# Patient Record
Sex: Female | Born: 1955 | Race: White | Hispanic: No
Health system: Midwestern US, Community
[De-identification: ages and names within clinical notes are randomized; demographics above are authoritative.]

## PROBLEM LIST (undated history)

## (undated) DIAGNOSIS — E039 Hypothyroidism, unspecified: Secondary | ICD-10-CM

## (undated) DIAGNOSIS — Z227 Latent tuberculosis: Secondary | ICD-10-CM

## (undated) DIAGNOSIS — E669 Obesity, unspecified: Secondary | ICD-10-CM

## (undated) DIAGNOSIS — M549 Dorsalgia, unspecified: Secondary | ICD-10-CM

## (undated) DIAGNOSIS — G8929 Other chronic pain: Secondary | ICD-10-CM

## (undated) DIAGNOSIS — I10 Essential (primary) hypertension: Secondary | ICD-10-CM

## (undated) DIAGNOSIS — Z8673 Personal history of transient ischemic attack (TIA), and cerebral infarction without residual deficits: Secondary | ICD-10-CM

## (undated) DIAGNOSIS — R0602 Shortness of breath: Secondary | ICD-10-CM

## (undated) DIAGNOSIS — F32A Depression, unspecified: Secondary | ICD-10-CM

## (undated) DIAGNOSIS — E782 Mixed hyperlipidemia: Principal | ICD-10-CM

## (undated) DIAGNOSIS — R059 Cough, unspecified: Secondary | ICD-10-CM

## (undated) DIAGNOSIS — M62830 Muscle spasm of back: Secondary | ICD-10-CM

## (undated) DIAGNOSIS — Z1231 Encounter for screening mammogram for malignant neoplasm of breast: Secondary | ICD-10-CM

## (undated) DIAGNOSIS — M5489 Other dorsalgia: Principal | ICD-10-CM

## (undated) DIAGNOSIS — R000 Tachycardia, unspecified: Secondary | ICD-10-CM

## (undated) DIAGNOSIS — R7309 Other abnormal glucose: Secondary | ICD-10-CM

## (undated) DIAGNOSIS — M810 Age-related osteoporosis without current pathological fracture: Secondary | ICD-10-CM

## (undated) DIAGNOSIS — M5441 Lumbago with sciatica, right side: Secondary | ICD-10-CM

## (undated) DIAGNOSIS — R748 Abnormal levels of other serum enzymes: Secondary | ICD-10-CM

## (undated) DIAGNOSIS — N2889 Other specified disorders of kidney and ureter: Secondary | ICD-10-CM

## (undated) DIAGNOSIS — E876 Hypokalemia: Secondary | ICD-10-CM

## (undated) DIAGNOSIS — R7989 Other specified abnormal findings of blood chemistry: Secondary | ICD-10-CM

## (undated) DIAGNOSIS — R053 Chronic cough: Secondary | ICD-10-CM

## (undated) DIAGNOSIS — M5451 Vertebrogenic low back pain: Secondary | ICD-10-CM

## (undated) DIAGNOSIS — D72819 Decreased white blood cell count, unspecified: Secondary | ICD-10-CM

## (undated) DIAGNOSIS — E559 Vitamin D deficiency, unspecified: Secondary | ICD-10-CM

## (undated) DIAGNOSIS — M25559 Pain in unspecified hip: Secondary | ICD-10-CM

## (undated) DIAGNOSIS — N281 Cyst of kidney, acquired: Secondary | ICD-10-CM

## (undated) DIAGNOSIS — R2681 Unsteadiness on feet: Secondary | ICD-10-CM

## (undated) DIAGNOSIS — Z20822 Contact with and (suspected) exposure to covid-19: Secondary | ICD-10-CM

## (undated) DIAGNOSIS — M62838 Other muscle spasm: Secondary | ICD-10-CM

## (undated) DIAGNOSIS — M7500 Adhesive capsulitis of unspecified shoulder: Secondary | ICD-10-CM

## (undated) DIAGNOSIS — M1990 Unspecified osteoarthritis, unspecified site: Secondary | ICD-10-CM

## (undated) DIAGNOSIS — R52 Pain, unspecified: Secondary | ICD-10-CM

## (undated) DIAGNOSIS — T84498A Other mechanical complication of other internal orthopedic devices, implants and grafts, initial encounter: Secondary | ICD-10-CM

## (undated) HISTORY — DX: Essential (primary) hypertension: I10

## (undated) HISTORY — DX: Latent tuberculosis: Z22.7

## (undated) HISTORY — DX: Dorsalgia, unspecified: M54.9

## (undated) HISTORY — DX: Hypothyroidism, unspecified: E03.9

## (undated) HISTORY — PX: SPINE SURGERY: SHX786

## (undated) HISTORY — DX: Other chronic pain: G89.29

## (undated) HISTORY — DX: Obesity, unspecified: E66.9

## (undated) HISTORY — DX: Personal history of transient ischemic attack (TIA), and cerebral infarction without residual deficits: Z86.73

## (undated) LAB — VITAMIN D 25 HYDROXY

---

## 1996-02-26 HISTORY — PX: OOPHORECTOMY: SHX86

## 1996-02-26 HISTORY — PX: ABDOMINAL HYSTERECTOMY: SHX81

## 2002-02-25 HISTORY — PX: APPENDECTOMY: SHX54

## 2003-02-26 HISTORY — PX: CHOLECYSTECTOMY: SHX55

## 2006-09-12 LAB — CBC W/O DIFF
HCT: 37.4 % (ref 35.6–45.0)
HGB: 13 g/dL (ref 11.7–15.0)
MCH: 31.3 PG (ref 26.1–32.9)
MCHC: 34.9 g/dL (ref 31.4–35.0)
MCV: 89.8 FL (ref 79.6–97.8)
MPV: 8.7 FL — ABNORMAL LOW (ref 9.3–12.9)
PLATELET: 334 10*3/uL (ref 140–440)
RBC: 4.16 M/uL (ref 3.86–5.18)
RDW: 13.1 % (ref 11.9–14.6)
WBC: 8.4 10*3/uL (ref 4.5–10.5)

## 2006-09-12 LAB — METABOLIC PANEL, BASIC
Anion gap: 10 (ref 7–16)
BUN: 11 MG/DL (ref 7–18)
CO2: 24 MMOL/L (ref 21–32)
Calcium: 9 MG/DL (ref 8.4–10.4)
Chloride: 105 MMOL/L (ref 98–107)
Creatinine: 0.7 MG/DL (ref 0.6–1.0)
GFR est AA: >60 mL/min/{1.73_m2} (ref >60)
GFR est non-AA: >60 mL/min/{1.73_m2} (ref >60)
Glucose: 80 MG/DL (ref 74–106)
Potassium: 4.2 MMOL/L (ref 3.5–5.1)
Sodium: 139 MMOL/L (ref 136–145)

## 2006-09-15 MED ORDER — FAMOTIDINE 20 MG TAB
20 mg | Freq: Once | ORAL | Status: AC
Start: 2006-09-15 — End: 2006-09-16
  Administered 2006-09-16: 10:00:00 via ORAL

## 2006-09-15 MED ORDER — METOCLOPRAMIDE 10 MG TAB
10 mg | Freq: Once | ORAL | Status: AC
Start: 2006-09-15 — End: 2006-09-16
  Administered 2006-09-16: 10:00:00 via ORAL

## 2006-09-15 MED ORDER — MIDAZOLAM 1 MG/ML IJ SOLN
1 mg/mL | Freq: Once | INTRAMUSCULAR | Status: AC
Start: 2006-09-15 — End: 2006-09-16
  Administered 2006-09-16: 11:00:00 via INTRAVENOUS

## 2006-09-15 MED ORDER — LIDOCAINE HCL 1 % (10 MG/ML) IJ SOLN
10 mg/mL (1 %) | Freq: Once | INTRAMUSCULAR | Status: DC
Start: 2006-09-15 — End: 2006-09-16

## 2006-09-15 MED ORDER — LACTATED RINGERS IV
INTRAVENOUS | Status: DC
Start: 2006-09-15 — End: 2006-09-16
  Administered 2006-09-16: 10:00:00 via INTRAVENOUS

## 2006-09-15 MED ORDER — SODIUM CHLORIDE 0.9 % IV PIGGY BACK
1000 mg | Freq: Once | INTRAVENOUS | Status: DC
Start: 2006-09-15 — End: 2006-09-15

## 2006-09-15 MED FILL — VANCOMYCIN 1,000 MG IV SOLR: 1000 mg | INTRAVENOUS | Qty: 1000

## 2006-09-16 ENCOUNTER — Inpatient Hospital Stay
Admit: 2006-09-16 | Discharge: 2006-09-17 | Disposition: A | Discharge status: Home / Self Care | Source: Home / Self Care | Attending: Orthopaedic Surgery | Admitting: Orthopaedic Surgery

## 2006-09-16 MED ORDER — LIDOCAINE (PF) 20 MG/ML (2 %) IV SYRINGE
100 mg/5 mL (2 %) | INTRAVENOUS | Status: DC | PRN
Start: 2006-09-16 — End: 2006-09-16

## 2006-09-16 MED ORDER — HYDROMORPHONE 2 MG/ML INJECTION SOLUTION
2 mg/mL | INTRAMUSCULAR | Status: DC | PRN
Start: 2006-09-16 — End: 2006-09-16
  Administered 2006-09-16 (×3): via INTRAVENOUS

## 2006-09-16 MED ORDER — MIDAZOLAM 1 MG/ML IJ SOLN
1 mg/mL | Freq: Once | INTRAMUSCULAR | Status: AC
Start: 2006-09-16 — End: 2006-09-16
  Administered 2006-09-16: 15:00:00

## 2006-09-16 MED ORDER — ATROPINE 0.4 MG/ML IJ SOLN
0.4 mg/mL | INTRAMUSCULAR | Status: DC | PRN
Start: 2006-09-16 — End: 2006-09-16

## 2006-09-16 MED ORDER — ONDANSETRON HCL 2 MG/ML IV
2 mg/mL | INTRAVENOUS | Status: DC | PRN
Start: 2006-09-16 — End: 2006-09-16

## 2006-09-16 MED ORDER — PROMETHAZINE 25 MG/ML INJECTION
25 mg/mL | INTRAMUSCULAR | Status: DC | PRN
Start: 2006-09-16 — End: 2006-09-16

## 2006-09-16 MED ORDER — LACTATED RINGERS IV
INTRAVENOUS | Status: DC
Start: 2006-09-16 — End: 2006-09-16
  Administered 2006-09-16: 14:00:00

## 2006-09-16 MED ADMIN — morphine PCA standard: @ 15:00:00 | NDC 00074405701

## 2006-09-16 MED ADMIN — vitamin E (AQUA GEMS) capsule 400 Units: @ 20:00:00 | NDC 12634034410

## 2006-09-16 MED ADMIN — phenol throat spray (CHLORASEPTIC) spray 1 Spray: ORAL | @ 18:00:00 | NDC 66440040108

## 2006-09-16 MED ADMIN — promethazine (PHENERGAN) tablet 25 mg: ORAL | @ 20:00:00 | NDC 60977000143

## 2006-09-16 MED ADMIN — dextrose 5% - 0.45% NaCl with KCl 20 mEq/L infusion: INTRAVENOUS | @ 17:00:00 | NDC 72439050041

## 2006-09-16 MED ADMIN — vancomycin (VANCOCIN) 1 g IVPB: INTRAVENOUS | @ 18:00:00 | NDC 72611076510

## 2006-09-16 MED ADMIN — vancomycin (VANCOCIN) 1 g IVPB: INTRAVENOUS | @ 10:00:00 | NDC 72611076510

## 2006-09-16 MED FILL — D5-1/2 NS & POTASSIUM CHLORIDE 20 MEQ/L IV: 20 mEq/L | INTRAVENOUS | Qty: 1000

## 2006-09-16 MED FILL — MIDAZOLAM 1 MG/ML IJ SOLN: 1 mg/mL | INTRAMUSCULAR | Qty: 2

## 2006-09-16 MED FILL — HYDROMORPHONE 2 MG/ML INJECTION SOLUTION: 2 mg/mL | INTRAMUSCULAR | Qty: 1

## 2006-09-16 MED FILL — THROMBIN-JMI 5,000 UNIT TOPICAL SOLUTION: 5000 unit | CUTANEOUS | Qty: 1

## 2006-09-16 MED FILL — PROMETHAZINE 25 MG TAB: 25 mg | ORAL | Qty: 1

## 2006-09-16 MED FILL — FAMOTIDINE 20 MG TAB: 20 mg | ORAL | Qty: 1

## 2006-09-16 MED FILL — METOCLOPRAMIDE 10 MG TAB: 10 mg | ORAL | Qty: 1

## 2006-09-16 MED FILL — SORE THROAT SPRAY: Qty: 177

## 2006-09-16 MED FILL — CYCLOBENZAPRINE 10 MG TAB: 10 mg | ORAL | Qty: 1

## 2006-09-16 MED FILL — LACTATED RINGERS IV: INTRAVENOUS | Qty: 1000

## 2006-09-16 MED FILL — VANCOMYCIN 1,000 MG IV SOLR: 1000 mg | INTRAVENOUS | Qty: 1000

## 2006-09-16 MED FILL — MORPHINE PCA STANDARD: INTRAVENOUS | Qty: 20

## 2006-09-16 MED FILL — DOCUSATE SODIUM 100 MG CAP: 100 mg | ORAL | Qty: 1

## 2006-09-16 MED FILL — ACETAMINOPHEN 325 MG TABLET: 325 mg | ORAL | Qty: 2

## 2006-09-16 NOTE — Progress Notes (Unsigned)
[facility]   [address]   [place], [place]. [address]   [phone]     OPERATIVE REPORT    NAME: Harmon, Sandra MR: [id number]  LOC: [id number] SEX: F ACCT: [id number]  DOB: February 24, 1956 AGE: 51 PT: I  ADMIT: 09/16/2006 DSCH: MSV: SUR    DATE OF PROCEDURE: 09/16/06    SURGEON: Tenny Craw, M.D.    ASSISTANT: Alma Friendly, P.A. A skilled technical assistant was deemed  necessary for this case due to the intimate proximity of the cervical  spinal cord and use of the intraoperative microscope. She was there for  the entire duration of the case.    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    PROCEDURE:  1. Anterior cervical diskectomy with decompression of the neural elements   under microscope magnification C5-C6.  2. Anterior cervical arthrodesis interbody technique C5-C6.  3. Anterior cervical instrumentation C5-C6.  4. Tricortical iliac crest bone graft harvested through a separate   incision.    ANESTHESIA: General.    ESTIMATED BLOOD LOSS: Less than 50 mL.    COMPLICATIONS: None.    POSTOPERATIVE CONDITION: Stable.    INDICATIONS FOR PROCEDURE: Sandra Harmon is a very pleasant 51 year old  lady who had an eight month history of neck pain with radiation to the  right shoulder and upper extremity that was consistent with a right C6  radiculopathy. She had weakness in the biceps and wrist extension, and  also had a diminished C6 brachioradialis reflex. She had paresthesias in  the C6 dermatome. Preoperatively, she had tried muscle relaxants, oral  steroids, and physical therapy including traction. In the outpatient  setting, the risks, benefits and potential complications of the above  listed procedure were discussed in detail and an informed consent was  obtained.     DESCRIPTION OF PROCEDURE: After adequate induction of general  anesthesia, the patient was positioned supine on the operating table. A  shoulder roll was placed. A bump was placed under the left hip. Care  was taken to pad all bony prominences. The neck and left iliac crest  were prepped and draped in the usual sterile fashion. Preoperative  antibiotics were administered. A time out was called. With confirmation  of the appropriate patient, procedure and incision site, incision was  created over the left anterior lateral aspect of the neck. A  Smith-Robinson approach was performed down to the C5-C6 disk. The disk  space was marked with a spinal needle and a cross-table cervical  fluoroscopic image was obtained to confirm appropriate spinal level.  With this confirmation, the disk space was marked with electrocautery and  the needle was removed. Caspar soft tissue retractors were replaced  beneath the longus coli and Caspar pins were placed in the C5-C6  vertebral bodies. After light distraction across the disk space, the  anulus was released with a 15 blade and excised with pituitary rongeur.  A complete diskectomy was performed using a triple zero curette and a 2  mm Kerrison. The uncovertebral joints were taken down anteriorly using a  4 mm bur. The cartilaginous endplates were also flattened and prepared  for arthrodesis using the 4 mm bur. The triple zero curette was used to  elevate the anulus fibrosis posteriorly and an interval was created  posterior to the posterior longitudinal ligament. A 2 mm  Kerrison was  used to resect the posterior osteophytes as well as the right  uncovertebral joint. A ball tipped nerve hook was then passed laterally  to confirm no further nerve root impingement of the right C6 nerve root.  With this confirmation, attention was directed towards the left iliac  crest where a separate incision was created over the iliac tubercle. The   fascial planes were reflected and a tricortical iliac crest graft was  harvested using a sagittal saw. The graft defect was packed with Gelfoam  for hemostasis. Meticulous hemostasis was obtained in the soft tissue  envelope. The fascial plane was approximated with a #1 Vicryl suture in a  locked running fashion. The skin and subcutaneous tissues were  approximated in a layered fashion. Attention was directed back towards  the neck where the tricortical iliac crest graft was shaped appropriately  into about a 7 mL tall tricortical wedge graft that was impacted into the  C5-C6 interspace for arthrodesis. At this point, the Medtronic Atlantis  Vision plating system was brought to the field and a 23 mm four-hole  plate was selected. The four peripheral screw holes were filled with  fixed angle 12 mm screws after first pre-drilling with the fixed angle  drill guide. Once the plate was secured and the locking mechanism  tightened, C-arm fluoroscopy was brought in and used to obtain tangential  anteroposterior and lateral images to confirm appropriate hardware  placement, appropriate levels, and graft placement, all of which were  felt to be satisfactory. At this point, the neck wound was liberally  irrigated. The iliac crest wound had been liberally irrigated prior to  closure as well. A Penrose drain was inserted and the neck was closed in  a layered fashion using Vicryl suture. Benzoin and Steri-Strips were  applied to both wounds. Sterile dressings were applied. The patient was  placed in a soft cervical collar. She was returned to the postanesthesia  care unit in stable condition. At the end of the case, all sponge,  needle and instrument counts were correct. Again, the intraoperative  microscope was used throughout the duration of the diskectomy and  preparation of the endplates for arthrodesis.          ____________________________________Christopher Tenna Delaine, MD A      This is an unverified document unless signed by physician.    TID: [id number] DT: 09/16/2006 10:35 A  JOB: [id number] DOC#: [id number] DD: 09/16/2006    cc: Roe Rutherford, MD

## 2006-09-17 LAB — GLUCOSE, POC: Glucose (POC): 81 mg/dL (ref 70–110)

## 2006-09-17 MED ADMIN — hydrochlorothiazide (MICROZIDE) capsule 12.5 mg: ORAL | @ 02:00:00 | NDC 71610027660

## 2006-09-17 MED ADMIN — vancomycin (VANCOCIN) 1 g IVPB: INTRAVENOUS | @ 05:00:00 | NDC 72611076510

## 2006-09-17 MED ADMIN — oxycodone (ROXICODONE) tablet 5 mg: ORAL | @ 13:00:00 | NDC 72162174902

## 2006-09-17 MED ADMIN — fenofibrate (TRICOR) tablet 145 mg: ORAL | @ 02:00:00 | NDC 82009006090

## 2006-09-17 MED ADMIN — lisinopril (PRINIVIL, ZESTRIL) tablet 20 mg: ORAL | @ 02:00:00 | NDC 82009006510

## 2006-09-17 MED ADMIN — oxycodone (ROXICODONE) tablet 5 mg: ORAL | @ 18:00:00 | NDC 72162174902

## 2006-09-17 MED ADMIN — lorazepam (ATIVAN) tablet 0.25 mg: ORAL | @ 02:00:00 | NDC 76420064790

## 2006-09-17 MED ADMIN — docusate sodium (COLACE) capsule 100 mg: ORAL | @ 13:00:00 | NDC 57664011208

## 2006-09-17 MED ADMIN — levothyroxine (SYNTHROID) tablet 150 mcg: ORAL | @ 02:00:00 | NDC 72865024490

## 2006-09-17 MED FILL — VANCOMYCIN 1,000 MG IV SOLR: 1000 mg | INTRAVENOUS | Qty: 1000

## 2006-09-17 MED FILL — DOCUSATE SODIUM 100 MG CAP: 100 mg | ORAL | Qty: 1

## 2006-09-17 MED FILL — LORAZEPAM 0.5 MG TAB: 0.5 mg | ORAL | Qty: 1

## 2006-09-17 MED FILL — OXYCODONE 5 MG TAB: 5 mg | ORAL | Qty: 1

## 2006-09-17 MED FILL — SODIUM CHLORIDE 0.9 % IV PIGGY BACK: INTRAVENOUS | Qty: 250

## 2006-09-17 MED FILL — VITAMIN E 400 UNIT CAP: 268 mg (400 unit) | ORAL | Qty: 1

## 2006-10-26 NOTE — ED Provider Notes (Signed)
HPI Comments: 51 yo white female fell over a dog yesterday injuring her right shoulder and left hip.  Shoulder Pain   The history is provided by the patient and spouse. The incident occurred 12 - 24 hours ago. The incident occurred at home. The injury mechanism was a fall. The right shoulder is affected. The pain is at a severity of 6/10. The pain has been constant since onset. The pain does not radiate. There is a history of shoulder injury. She has other injuries (Patient c/o pain in left hip also as a result of the fall.). There is no history of shoulder surgery. Pertinent negatives include no numbness, no muscle weakness and no tingling. She reports no foreign bodies present.        Review of Systems   Constitutional: Negative for fever, chills, diaphoresis and weakness.   Skin: Negative for rash and itching.   Cardiovascular: Negative for chest pain.   Gastrointestinal: Negative for nausea and vomiting.   Musculoskeletal: Positive for myalgias, joint pain and falls. Negative for neck pain and back pain.   Neurological: Negative for tingling, sensory change and focal weakness.       Physical Exam   Constitutional: She is oriented. She appears well-developed. She appears not diaphoretic. No distress.   HENT:   Head: Normocephalic and atraumatic.   Eyes: Conjunctivae are normal. Right eye exhibits no discharge. Left eye exhibits no discharge. No scleral icterus.   Cardiovascular: Normal rate.    Pulmonary/Chest: Effort normal. No respiratory distress.   Musculoskeletal: Normal range of motion. She exhibits tenderness. She exhibits no edema.        Patient has good ROM of the right shoulder, but c/o pain with movement.  She has pain to palpate the top of the shoulder, but no pain over th anterior aspect, clavicle, or deltoid.  She also has pain in the rihgt buttock, but no true hip pain per se.  No evidence of neurovascular compromise.    Neurological: She is alert and oriented. She exhibits normal muscle tone. Coordination normal.   Skin: Skin is warm and dry. No rash noted. She is not diaphoretic. No erythema. No pallor.   Psychiatric: She has a normal mood and affect.       Penicillins, Lortab and Lipitor    History   Social History   ??? Marital Status: Married     Spouse Name: N/A     Number of Children: N/A   ??? Years of Education: N/A   Occupational History   ??? Not on file.   Social History Main Topics   ??? Tobacco Use: Quit     Quit date:  10/25/1996   ??? Alcohol Use: No   ??? Drug Use: No   ??? Sexually Active:    Other Topics Concern   ??? Not on file   Social History Narrative   ??? No narrative on file

## 2006-10-26 NOTE — ED Notes (Signed)
Pt fell last pm, has pain to R shoulder, good mobility.

## 2006-10-26 NOTE — ED Notes (Signed)
Xrays of right shoulder and left hip are both neg.

## 2008-02-26 HISTORY — PX: OTHER SURGICAL HISTORY: SHX169

## 2010-04-09 MED ORDER — IPRATROPIUM-ALBUTEROL 2.5 MG-0.5 MG/3 ML NEB SOLUTION
2.5 mg-0.5 mg/3 ml | RESPIRATORY_TRACT | Status: DC
Start: 2010-04-09 — End: 2010-04-09

## 2010-04-09 MED ORDER — ONDANSETRON (PF) 4 MG/2 ML INJECTION
4 mg/2 mL | INTRAMUSCULAR | Status: AC
Start: 2010-04-09 — End: 2010-04-09
  Administered 2010-04-10: 01:00:00 via INTRAMUSCULAR

## 2010-04-09 NOTE — ED Notes (Signed)
Patient c/o pain to left flank that radiates to groin area. Patient started having pain Friday. Patient also having nausea. Patient denies urgency, odor.

## 2010-04-09 NOTE — ED Notes (Signed)
Discharge instructions and prescriptions given and explained. Instructed not to drive post pain meds. Verbalized understanding. Ambulatory out of ed with steady gait. Family at bedside for transport

## 2010-04-09 NOTE — ED Provider Notes (Signed)
HPI Comments: 81 wf complains of pain in the left back and into the groin. She has had kidney stones in the past but she also has a left ovarian cyst that has been hurting for a while. No fever or vomiting.     Patient is a 55 y.o. female presenting with flank pain. The history is provided by the patient and the spouse.   Flank Pain   This is a recurrent problem. The current episode started yesterday. The problem has not changed since onset.The problem occurs constantly. Patient reports no work related injury.The pain is associated with no known injury. The quality of the pain is described as aching. The pain radiates to the left groin. The pain is at a severity of 10/10. The pain is moderate. The pain is the same all the time. Associated symptoms include pelvic pain. Pertinent negatives include no chest pain, no fever, no abdominal pain, no abdominal swelling, no bowel incontinence, no perianal numbness and no dysuria. She has tried NSAIDs for the symptoms. The treatment provided no relief. Risk factors include history of kidney stones (history of ovarian cyst. ).        Past Medical History   Diagnosis Date   ??? Hypercholesteremia    ??? Hypothyroidism    ??? HTN    ??? Endocrine disease         Past Surgical History   Procedure Date   ??? Hx appendectomy    ??? Hx cholecystectomy    ??? Hx tah and bso    ??? Hx orthopaedic      neck repain with L hip graft         No family history on file.     History     Social History   ??? Marital Status: Married     Spouse Name: N/A     Number of Children: N/A   ??? Years of Education: N/A     Occupational History   ??? Not on file.     Social History Main Topics   ??? Smoking status: Former Smoker     Quit date: 10/25/1996   ??? Smokeless tobacco: Not on file   ??? Alcohol Use: No   ??? Drug Use: No   ??? Sexually Active:      Other Topics Concern   ??? Not on file     Social History Narrative   ??? No narrative on file                  ALLERGIES: Lipitor; Lortab; and Penicillins      Review of Systems    Constitutional: Negative for fever.   Respiratory: Negative for shortness of breath.    Cardiovascular: Negative for chest pain, palpitations and leg swelling.   Gastrointestinal: Negative for abdominal pain and bowel incontinence.   Genitourinary: Positive for flank pain and pelvic pain. Negative for dysuria, urgency, hematuria and difficulty urinating.   Musculoskeletal: Positive for back pain.       Filed Vitals:    04/09/10 1448   BP: 157/112   Pulse: 112   Temp: 97.8 ??F (36.6 ??C)   Resp: 18   Height: 5' (1.524 m)   Weight: 160 lb (72.576 kg)   SpO2: 100%            Physical Exam   [nursing notereviewed.  Constitutional: She is oriented to person, place, and time. She appears well-developed and well-nourished.   HENT:   Head: Normocephalic and atraumatic.  Right Ear: External ear normal.   Left Ear: External ear normal.   Nose: Nose normal.   Mouth/Throat: Oropharynx is clear and moist.   Eyes: Conjunctivae and EOM are normal. Pupils are equal, round, and reactive to light. Right eye exhibits no discharge. Left eye exhibits no discharge.   Neck: Normal range of motion. Neck supple. No thyromegaly present.   Cardiovascular: Normal rate, regular rhythm, normal heart sounds and intact distal pulses.  Exam reveals no gallop and no friction rub.    No murmur heard.  Pulmonary/Chest: Effort normal and breath sounds normal.   Abdominal: Soft. No tenderness. She has no rebound.        No palpable tenderness in the left flank but in the paraspinal muscles are tender. No abdominal mass or tenderness. Mild pelvic tenderness.   Musculoskeletal: Normal range of motion. She exhibits tenderness. She exhibits no edema.        Arms:  Neurological: She is alert and oriented to person, place, and time.   Skin: Skin is warm and dry.        MDM    Procedures

## 2010-04-09 NOTE — ED Notes (Signed)
Ultrasound made aware of pt completion of drinking water. Will send for pt. Pt and family informed. Verbalized understanding.

## 2010-04-09 NOTE — ED Notes (Signed)
Reports increase in left lower back pain radiating into left lower abd & into groin, worse since last pm & today, reports nausea no vomiting, reports no appetite, states she has a known ovarian cyst on her left side found 2-3 months ago but this pain is more severe

## 2010-04-09 NOTE — ED Notes (Signed)
Call received from ultrasound requesting for pt to begin drinking to fill bladder. Pt informed of plan, verbalized understanding. Water provided. Pt awaiting meds from pharmacy

## 2010-04-09 NOTE — ED Notes (Signed)
Pt c/o pain and nausea. md made aware at to bedside to explain test results.

## 2010-04-09 NOTE — ED Notes (Signed)
No ovarian cyst noted on [place].

## 2010-04-10 LAB — CBC WITH AUTOMATED DIFF
ABS. BASOPHILS: 0 10*3/uL (ref 0.0–0.2)
ABS. EOSINOPHILS: 0.1 10*3/uL (ref 0.0–0.8)
ABS. IMM. GRANS.: 0 10*3/uL (ref 0.0–0.5)
ABS. LYMPHOCYTES: 3.6 10*3/uL (ref 0.5–4.6)
ABS. MONOCYTES: 0.5 10*3/uL (ref 0.1–1.3)
ABS. NEUTROPHILS: 2.8 10*3/uL (ref 1.7–8.2)
BASOPHILS: 0 % (ref 0.0–2.0)
EOSINOPHILS: 2 % (ref 0.5–7.8)
HCT: 46.7 % — ABNORMAL HIGH (ref 35.8–46.3)
HGB: 16.1 g/dL — ABNORMAL HIGH (ref 11.7–15.4)
IMMATURE GRANULOCYTES: 0.1 % (ref 0.0–5.0)
LYMPHOCYTES: 51 % — ABNORMAL HIGH (ref 13–44)
MCH: 30.1 PG (ref 26.1–32.9)
MCHC: 34.5 g/dL (ref 31.4–35.0)
MCV: 87.3 FL (ref 79.6–97.8)
MONOCYTES: 7 % (ref 4.0–12.0)
MPV: 10.3 FL — ABNORMAL LOW (ref 10.8–14.1)
NEUTROPHILS: 40 % — ABNORMAL LOW (ref 43–78)
PLATELET: 245 10*3/uL (ref 150–450)
RBC: 5.35 M/uL — ABNORMAL HIGH (ref 4.05–5.25)
RDW: 12.9 % (ref 11.9–14.6)
WBC: 7 10*3/uL (ref 4.3–11.1)

## 2010-04-10 MED ORDER — MEPERIDINE 50 MG TAB
50 mg | ORAL_TABLET | Freq: Four times a day (QID) | ORAL | Status: DC | PRN
Start: 2010-04-10 — End: 2010-08-27

## 2010-04-10 MED ORDER — PROMETHAZINE 25 MG TAB
25 mg | ORAL_TABLET | Freq: Four times a day (QID) | ORAL | Status: DC | PRN
Start: 2010-04-10 — End: 2010-08-27

## 2010-04-10 MED ADMIN — meperidine (DEMEROL) injection 50 mg: INTRAMUSCULAR | @ 01:00:00 | NDC 00641605201

## 2010-04-10 MED FILL — DEMEROL (PF) 25 MG/ML INJECTION SYRINGE: 25 mg/mL | INTRAMUSCULAR | Qty: 2

## 2010-04-10 MED FILL — ONDANSETRON (PF) 4 MG/2 ML INJECTION: 4 mg/2 mL | INTRAMUSCULAR | Qty: 2

## 2010-08-27 LAB — METABOLIC PANEL, COMPREHENSIVE
A-G Ratio: 0.9 — ABNORMAL LOW (ref 1.2–3.5)
ALT (SGPT): 57 U/L (ref 39–65)
AST (SGOT): 21 U/L (ref 15–37)
Albumin: 3.7 g/dL (ref 3.5–5.0)
Alk. phosphatase: 93 U/L (ref 50–136)
Anion gap: 14 mmol/L (ref 7–16)
BUN: 8 MG/DL (ref 6–23)
Bilirubin, total: 0.9 MG/DL (ref 0.2–1.1)
CO2: 21 MMOL/L — ABNORMAL LOW (ref 23–32)
Calcium: 9.4 MG/DL (ref 8.3–10.4)
Chloride: 103 MMOL/L (ref 98–107)
Creatinine: 0.6 MG/DL (ref 0.6–1.5)
GFR est AA: >60 mL/min/{1.73_m2} (ref >60)
GFR est non-AA: >60 mL/min/{1.73_m2} (ref >60)
Globulin: 4.1 g/dL — ABNORMAL HIGH (ref 2.3–3.5)
Glucose: 120 MG/DL — ABNORMAL HIGH (ref 65–100)
Potassium: 3.6 MMOL/L (ref 3.5–5.1)
Protein, total: 7.8 g/dL (ref 6.3–8.2)
Sodium: 138 MMOL/L (ref 136–145)

## 2010-08-27 LAB — CBC WITH AUTOMATED DIFF
ABS. BASOPHILS: 0 10*3/uL (ref 0.0–0.2)
ABS. EOSINOPHILS: 0 10*3/uL (ref 0.0–0.8)
ABS. IMM. GRANS.: 0.1 10*3/uL (ref 0.0–0.5)
ABS. LYMPHOCYTES: 1.6 10*3/uL (ref 0.5–4.6)
ABS. MONOCYTES: 1.4 10*3/uL — ABNORMAL HIGH (ref 0.1–1.3)
ABS. NEUTROPHILS: 13 10*3/uL — ABNORMAL HIGH (ref 1.7–8.2)
BASOPHILS: 0 % (ref 0.0–2.0)
EOSINOPHILS: 0 % — ABNORMAL LOW (ref 0.5–7.8)
HCT: 42.6 % (ref 35.8–46.3)
HGB: 14.8 g/dL (ref 11.7–15.4)
IMMATURE GRANULOCYTES: 0.3 % (ref 0.0–5.0)
LYMPHOCYTES: 10 % — ABNORMAL LOW (ref 13–44)
MCH: 30.3 PG (ref 26.1–32.9)
MCHC: 34.7 g/dL (ref 31.4–35.0)
MCV: 87.3 FL (ref 79.6–97.8)
MONOCYTES: 9 % (ref 4.0–12.0)
MPV: 10.3 FL — ABNORMAL LOW (ref 10.8–14.1)
NEUTROPHILS: 81 % — ABNORMAL HIGH (ref 43–78)
PLATELET: 231 10*3/uL (ref 150–450)
RBC: 4.88 M/uL (ref 4.05–5.25)
RDW: 13 % (ref 11.9–14.6)
WBC: 16 10*3/uL — ABNORMAL HIGH (ref 4.3–11.1)

## 2010-08-27 LAB — D DIMER: D DIMER: 0.28 ug/ml(FEU) (ref <0.55)

## 2010-08-27 LAB — LACTIC ACID: Lactic acid: 1 MMOL/L (ref 0.4–2.0)

## 2010-08-27 LAB — D-DIMER, QUANTITATIVE: D-Dimer, Quant: 0.28 ug/ml(FEU) (ref <0.55)

## 2010-08-27 MED ORDER — DOXYCYCLINE HYCLATE 100 MG TAB
100 mg | ORAL_TABLET | Freq: Two times a day (BID) | ORAL | Status: AC
Start: 2010-08-27 — End: 2010-09-03

## 2010-08-27 MED ORDER — SODIUM CHLORIDE 0.9% BOLUS IV
0.9 % | Freq: Once | INTRAVENOUS | Status: AC
Start: 2010-08-27 — End: 2010-08-27
  Administered 2010-08-27: 17:00:00 via INTRAVENOUS

## 2010-08-27 MED ORDER — TRAMADOL 50 MG TAB
50 mg | ORAL_TABLET | Freq: Four times a day (QID) | ORAL | Status: DC | PRN
Start: 2010-08-27 — End: 2011-05-30

## 2010-08-27 MED ORDER — ONDANSETRON 8 MG TAB, RAPID DISSOLVE
8 mg | ORAL | Status: AC
Start: 2010-08-27 — End: 2010-08-27
  Administered 2010-08-27: 16:00:00 via ORAL

## 2010-08-27 MED ORDER — TRAMADOL 50 MG TAB
50 mg | ORAL | Status: AC
Start: 2010-08-27 — End: 2010-08-27
  Administered 2010-08-27: 19:00:00 via ORAL

## 2010-08-27 MED ORDER — ONDANSETRON 8 MG TAB, RAPID DISSOLVE
8 mg | ORAL_TABLET | Freq: Three times a day (TID) | ORAL | Status: DC | PRN
Start: 2010-08-27 — End: 2011-05-30

## 2010-08-27 MED ORDER — KETOROLAC TROMETHAMINE 30 MG/ML INJECTION
30 mg/mL (1 mL) | INTRAMUSCULAR | Status: AC
Start: 2010-08-27 — End: 2010-08-27
  Administered 2010-08-27: 16:00:00 via INTRAVENOUS

## 2010-08-27 MED ORDER — SODIUM CHLORIDE 0.9% BOLUS IV
0.9 % | Freq: Once | INTRAVENOUS | Status: AC
Start: 2010-08-27 — End: 2010-08-27
  Administered 2010-08-27: 16:00:00 via INTRAVENOUS

## 2010-08-27 NOTE — ED Notes (Signed)
Pt is feeling better but still has some headache. Will use ultram

## 2010-08-27 NOTE — ED Provider Notes (Addendum)
HPI Comments: Pt had onset of pleuritic chest pain, on post aspect of left side. No trauma. Minimal cough. Works at a day care center. Some nausea. Feeling sob, esp with any activity.     Patient is a 55 y.o. female presenting with fever. The history is provided by the patient.   Fever   This is a new problem. The current episode started 2 days ago. The problem occurs constantly. The problem has not changed since onset.Patient reports a subjective fever - was not measured.Associated symptoms include chest pain, headaches, sore throat, muscle aches and shortness of breath. Pertinent negatives include no diarrhea, no vomiting, no cough, no mental status change, no rash and no urinary symptoms.        Past Medical History   Diagnosis Date   ??? Hypercholesteremia    ??? Hypothyroidism    ??? HTN    ??? Endocrine disease         Past Surgical History   Procedure Date   ??? Hx appendectomy    ??? Hx cholecystectomy    ??? Hx tah and bso    ??? Hx orthopaedic      neck repain with L hip graft         No family history on file.     History     Social History   ??? Marital Status: Married     Spouse Name: N/A     Number of Children: N/A   ??? Years of Education: N/A     Occupational History   ??? Not on file.     Social History Main Topics   ??? Smoking status: Former Smoker     Quit date: 10/25/1996   ??? Smokeless tobacco: Not on file   ??? Alcohol Use: No   ??? Drug Use: No   ??? Sexually Active:      Other Topics Concern   ??? Not on file     Social History Narrative   ??? No narrative on file                  ALLERGIES: Lipitor; Lortab; and Penicillins      Review of Systems   Constitutional: Positive for fever, activity change and appetite change.   HENT: Positive for sore throat.    Respiratory: Positive for shortness of breath. Negative for cough.    Cardiovascular: Positive for chest pain.   Gastrointestinal: Negative for vomiting and diarrhea.   Skin: Negative for rash.   Neurological: Positive for headaches.    All other systems reviewed and are negative.        Filed Vitals:    08/27/10 1032 08/27/10 1159   BP: 133/87 129/79   Pulse: 138 134   Temp: 100 ??F (37.8 ??C)    Resp: 16 20   Height: 5' (1.524 m)    Weight: 71.668 kg (158 lb)    SpO2: 98% 94%            Physical Exam   Nursing note and vitals reviewed.  Constitutional: She is oriented to person, place, and time. She appears well-developed and well-nourished. No distress.   HENT:   Head: Normocephalic and atraumatic.   Right Ear: External ear normal.   Left Ear: External ear normal.   Mouth/Throat: Oropharynx is clear and moist.   Eyes: EOM are normal. Pupils are equal, round, and reactive to light.   Neck: Normal range of motion. Neck supple.   Cardiovascular: Regular rhythm.  tachy   Pulmonary/Chest: Effort normal and breath sounds normal. She exhibits tenderness.        Dec breath sounds   Abdominal: Soft. Bowel sounds are normal.   Musculoskeletal: Normal range of motion. She exhibits no edema and no tenderness.   Lymphadenopathy:     She has no cervical adenopathy.   Neurological: She is alert and oriented to person, place, and time.   Skin: Skin is warm and dry.   Psychiatric: She has a normal mood and affect.        MDM    Procedures

## 2010-08-27 NOTE — ED Notes (Signed)
Dr Haule at bedside.

## 2010-08-27 NOTE — ED Notes (Deleted)
Patient on bactrim and keflex.

## 2010-08-27 NOTE — ED Notes (Signed)
Feeling a little better. Repeat temp 100.6 orally

## 2010-08-27 NOTE — ED Notes (Signed)
Pt in hallbed continuing to wait for md eval at this time.

## 2010-08-27 NOTE — ED Notes (Signed)
Fluids running with no redness or swelling noted, pt to xray via transport.

## 2010-08-27 NOTE — ED Notes (Signed)
Pt states sneezing since Thursday, headache, fever and left rib pain when taking deep breaths starting yesterday morning, back pain starting last night. IV in place, blood drawn and sent to lab.

## 2010-08-27 NOTE — ED Notes (Signed)
Second liter of fluids running, no redness or swelling noted. Lights out for pt comfort, warm blanket given to family.

## 2010-08-27 NOTE — ED Notes (Signed)
PT c/o fever, headache and "left lung pain" when she breaths starting yesterday.

## 2010-08-27 NOTE — ED Notes (Signed)
Pt returned via transport.

## 2010-08-27 NOTE — ED Notes (Signed)
I have reviewed discharge instructions with the patient.  The patient verbalized understanding.

## 2010-08-27 NOTE — ED Notes (Signed)
Pt moved to room 3 placed on cycling vital signs.

## 2010-09-01 LAB — CULTURE, BLOOD: Culture result:: NO GROWTH

## 2011-05-29 NOTE — Brief Op Note (Signed)
BRIEF OPERATIVE NOTE    Date of Procedure: 05/31/2011     Preoperative Diagnosis:  ROTATOR CUFF TENDINITIS LEFT SHOULDER      AC OA LEFT SHOULDER    Postoperative Diagnosis:  ADHESIVE CAPSULITIS LEFT SHOULDER      AC OA LEFT SHOULDER    Procedure:  EXAMINATION AND MANIPULATION LEFT SHOULDER ARTHROSCOPY LEFT SHOULDER ARTHROSCOPIC SUBACROMIAL DECOMPRESSION, DISTAL CLAVICLE RESECTION, LYSIS OF ADHESIONS    Surgeon(s) and Role:     * Orvis Brill., MD - Primary    Anesthesia: General WITH INTERSCALENE BLOCK    Complications: NONE      Orvis Brill.,  MD

## 2011-05-29 NOTE — H&P (Signed)
GENERIC PRE-OP HISTORY AND PHYSICAL    Subjective:     Patient is a 56 y.o. RHD FEMALE WITH LEFT SHOULDER PAIN.  SEE OFFICE NOTE.    There are no active problems to display for this patient.    Past Medical History   Diagnosis Date   ??? Hypercholesteremia    ??? Hypothyroidism    ??? HTN    ??? Endocrine disease       Past Surgical History   Procedure Date   ??? Hx appendectomy    ??? Hx cholecystectomy    ??? Hx tah and bso    ??? Hx orthopaedic      neck repain with L hip graft      Prior to Admission medications    Medication Sig Start Date End Date Taking? Authorizing Provider   traMADol (ULTRAM) 50 mg tablet Take 1 Tab by mouth every six (6) hours as needed for Pain for 20 doses. 08/27/10   Everett Graff, MD   ondansetron (ZOFRAN ODT) 8 mg disintegrating tablet Take 1 Tab by mouth every eight (8) hours as needed for Nausea for 5 doses. 08/27/10   Everett Graff, MD   cyclobenzaprine (FLEXERIL) 10 mg tablet Take 10 mg by mouth three (3) times daily as needed.      Phys Other, MD   lisinopril (PRINIVIL, ZESTRIL) 20 mg tablet take 1 Tab by mouth daily.     Historical Provider   levothyroxine (SYNTHROID) 150 mcg tablet Take 100 mcg by mouth daily. Pm.    Historical Provider   CALCIUM 600 + D PO take 1 Tab by mouth daily.     Historical Provider   VITAMIN D PO take 1 Tab by mouth daily.     Historical Provider   vitamin E (AQUA GEMS) 400 unit capsule take 1 Cap by mouth three (3) times daily.     Historical Provider     Allergies   Allergen Reactions   ??? Lipitor (Atorvastatin) Other (comments)     Muscle pain   ??? Lortab (Hydrocodone-Acetaminophen) Itching   ??? Penicillins Hives      History   Substance Use Topics   ??? Smoking status: Former Smoker     Quit date: 10/25/1996   ??? Smokeless tobacco: Not on file   ??? Alcohol Use: No      No family history on file.   Review of Systems  A comprehensive review of systems was negative except for that written in the HPI.    Objective:     No data found.    There were no vitals taken for this  visit.  General:  Alert, cooperative, no distress, appears stated age.   Head:  Normocephalic, without obvious abnormality, atraumatic.                       Back:   Symmetric, no curvature. ROM normal. No CVA tenderness.   Lungs:   Clear to auscultation bilaterally.   Chest wall:  No tenderness or deformity.   Heart:  Regular rate and rhythm, S1, S2 normal, no murmur, click, rub or gallop.                   Extremities: Extremities normal, atraumatic, no cyanosis or edema.   Pulses: 2+ and symmetric all extremities.   Skin: Skin color, texture, turgor normal. No rashes or lesions.   Lymph nodes: Cervical, supraclavicular, and axillary nodes normal.   Neurologic: CNII-XII  intact. Normal strength, sensation and reflexes throughout.           Assessment:     ROTATOR CUFF TENDINITIS LEFT SHOULDER  AC OA LEFT SHOULDER    Plan:     The various methods of treatment have been discussed with the patient and family.   PATIENT HAS EXHAUSTED NON-OPERATIVE MODALITIES.  After consideration of risks, benefits and other options for treatment, the patient has consented to surgical intervention.    SEE OFFICE NOTE.

## 2011-05-30 LAB — HEMOGLOBIN: HGB: 13.8 g/dL (ref 11.7–15.4)

## 2011-05-30 NOTE — Other (Signed)
PAT nurse to inform patient of arrival time of 0630 for day of surgery.

## 2011-05-30 NOTE — Other (Signed)
Verified with patient: Patient's name, name of surgeon, procedure to be performed and date of procedure. All correct in system.    Pt declined offer to see anesthesia today.  Pt aware will meet MDA in preop DOS.    Labs done per anesthesia protocols / surgeon orders: HGB - WNL.    TYPE -  2    surgery.      Patient given opportunity to ask questions related to instructions given during pre- assessment.  All questions answered. Informed pt to refer to printed materials provided and to call with any questions between now and the DOS. Phone # printed on materials given to patient.

## 2011-05-31 ENCOUNTER — Inpatient Hospital Stay

## 2011-05-31 LAB — METABOLIC PANEL, BASIC
Anion gap: 11 mmol/L (ref 7–16)
BUN: 8 MG/DL (ref 6–23)
CO2: 20 MMOL/L — ABNORMAL LOW (ref 21–32)
Calcium: 7.2 MG/DL — ABNORMAL LOW (ref 8.3–10.4)
Chloride: 109 MMOL/L — ABNORMAL HIGH (ref 98–107)
Creatinine: 0.6 MG/DL (ref 0.6–1.0)
GFR est AA: >60 mL/min/{1.73_m2} (ref >60)
GFR est non-AA: >60 mL/min/{1.73_m2} (ref >60)
Glucose: 106 MG/DL — ABNORMAL HIGH (ref 65–100)
Potassium: 3.8 MMOL/L (ref 3.5–5.1)
Sodium: 140 MMOL/L (ref 136–145)

## 2011-05-31 LAB — TROPONIN I: Troponin-I, Qt.: <0.04 NG/ML (ref 0.02–0.05)

## 2011-05-31 LAB — HEMOGLOBIN A1C WITH EAG: Hemoglobin A1c: 5 % (ref 4.8–6.0)

## 2011-05-31 MED ORDER — LACTATED RINGERS IV
INTRAVENOUS | Status: DC
Start: 2011-05-31 — End: 2011-05-31
  Administered 2011-05-31: 16:00:00 via INTRAVENOUS

## 2011-05-31 MED ORDER — SODIUM CHLORIDE 0.9 % IV PIGGY BACK
1000 mg | Freq: Two times a day (BID) | INTRAVENOUS | Status: AC
Start: 2011-05-31 — End: 2011-06-01
  Administered 2011-05-31 – 2011-06-01 (×2): via INTRAVENOUS

## 2011-05-31 MED ORDER — LACTATED RINGERS IV
INTRAVENOUS | Status: DC
Start: 2011-05-31 — End: 2011-05-31
  Administered 2011-05-31: 11:00:00 via INTRAVENOUS

## 2011-05-31 MED ADMIN — acetaminophen (TYLENOL) tablet 650 mg: ORAL | @ 22:00:00 | NDC 51645070310

## 2011-05-31 MED ADMIN — vancomycin (VANCOCIN) 1,000 mg in 0.9% sodium chloride (MBP/ADV) 250 mL adv: INTRAVENOUS | @ 13:00:00 | NDC 00409653501

## 2011-05-31 MED ADMIN — lidocaine (XYLOCAINE) 10 mg/mL (1 %) injection 0.1 mL: SUBCUTANEOUS | @ 11:00:00 | NDC 00409427601

## 2011-05-31 MED ADMIN — 0.9% sodium chloride infusion: INTRAVENOUS | @ 20:00:00 | NDC 00409798309

## 2011-05-31 MED ADMIN — famotidine (PEPCID) tablet 20 mg: ORAL | @ 12:00:00 | NDC 00904555361

## 2011-05-31 MED ADMIN — ondansetron (ZOFRAN) 4 mg/2 mL injection: INTRAVENOUS | @ 17:00:00 | NDC 00781301072

## 2011-05-31 MED ADMIN — midazolam (VERSED) injection 2 mg: INTRAVENOUS | @ 13:00:00 | NDC 63323041112

## 2011-05-31 MED ADMIN — promethazine (PHENERGAN) injection 6.25 mg: INTRAVENOUS | @ 18:00:00 | NDC 00641092821

## 2011-05-31 MED ADMIN — diphenhydrAMINE (BENADRYL) injection 12.5 mg: INTRAVENOUS | NDC 63323066401

## 2011-05-31 MED ADMIN — fentaNYL citrate (PF) injection 100 mcg: INTRAVENOUS | @ 13:00:00 | NDC 00641602401

## 2011-05-31 MED ADMIN — promethazine (PHENERGAN) injection 6.25 mg: INTRAVENOUS | @ 19:00:00 | NDC 00641092821

## 2011-05-31 MED ADMIN — oxyCODONE IR (ROXICODONE) tablet 10 mg: ORAL | @ 22:00:00 | NDC 68084035411

## 2011-05-31 MED FILL — MIDAZOLAM 1 MG/ML IJ SOLN: 1 mg/mL | INTRAMUSCULAR | Qty: 2

## 2011-05-31 MED FILL — SODIUM CHLORIDE 0.9 % IV: INTRAVENOUS | Qty: 1000

## 2011-05-31 MED FILL — ONDANSETRON (PF) 4 MG/2 ML INJECTION: 4 mg/2 mL | INTRAMUSCULAR | Qty: 2

## 2011-05-31 MED FILL — VANCOMYCIN 1,000 MG IV SOLR: 1000 mg | INTRAVENOUS | Qty: 1000

## 2011-05-31 MED FILL — LIDOCAINE-EPINEPHRINE 1 %-1:100,000 IJ SOLN: 1 %-:00,000 | INTRAMUSCULAR | Qty: 20

## 2011-05-31 MED FILL — PROMETHAZINE 25 MG/ML INJECTION: 25 mg/mL | INTRAMUSCULAR | Qty: 1

## 2011-05-31 MED FILL — MAPAP (ACETAMINOPHEN) 325 MG TABLET: 325 mg | ORAL | Qty: 2

## 2011-05-31 MED FILL — FAMOTIDINE 20 MG TAB: 20 mg | ORAL | Qty: 1

## 2011-05-31 MED FILL — FENTANYL CITRATE (PF) 50 MCG/ML IJ SOLN: 50 mcg/mL | INTRAMUSCULAR | Qty: 4

## 2011-05-31 MED FILL — DIPHENHYDRAMINE HCL 50 MG/ML IJ SOLN: 50 mg/mL | INTRAMUSCULAR | Qty: 1

## 2011-05-31 MED FILL — OXYCODONE 5 MG TAB: 5 mg | ORAL | Qty: 2

## 2011-05-31 MED FILL — LIDOCAINE (PF) 20 MG/ML (2 %) IJ SOLN: 20 mg/mL (2 %) | INTRAMUSCULAR | Qty: 10

## 2011-05-31 MED FILL — SODIUM CHLORIDE 0.9 % IV PIGGY BACK: INTRAVENOUS | Qty: 250

## 2011-05-31 MED FILL — BUPIVACAINE-EPINEPHRINE (PF) 0.5 %-1:200,000 IJ SOLN: 0.5 %-1:200,000 | INTRAMUSCULAR | Qty: 30

## 2011-05-31 MED FILL — FENTANYL CITRATE (PF) 50 MCG/ML IJ SOLN: 50 mcg/mL | INTRAMUSCULAR | Qty: 2

## 2011-05-31 NOTE — Op Note (Signed)
[facility]                           [address]                            [name], [name] [address]                                [phone]                                OPERATIVE REPORT  ________________________________________________________________________  NAME:  Sandra Harmon, Sandra Harmon                        MR:  [id number]  LOC:  W3O [id number]             SEX:  F               ACCT:  [id number]  DOB:  1955/11/29            AGE:  56              PT:  T  ADMIT:  05/31/2011          DSCH:  06/01/2011     MSV:  SUR  ________________________________________________________________________      DATE OF SURGERY: 05/31/2011    PREPROCEDURE DIAGNOSES  1. Rotator cuff tendonitis.  2. Acromioclavicular joint arthritis, left shoulder.    POSTPROCEDURE DIAGNOSES  1. Adhesive capsulitis, left shoulder.  2. Acromioclavicular joint arthritis, left shoulder.    NAME OF PROCEDURE  1. Examination and manipulation of left shoulder.  2. Arthroscopy, left shoulder.  3. Arthroscopic subacromial decompression.  4. Arthroscopic distal clavicle resection.  5. Lysis of adhesions.    SURGEON: Tandy Gaw. Raynald Kemp, MD    ANESTHESIA: General, interscalene block.    CPT CODES:  [id number], [id number], [id number].  ICD-9 CODES: 726.0, 715.11.    PATHOLOGY  1. Type 2 acromion.  2. Acromioclavicular joint arthritis.  3. Capsulitis.    INDICATIONS: The patient is a 56 year old female who has developed a  sore, painful, stiff left shoulder. Preoperative physical examination and  MR demonstrated a type 2 acromion, degenerative changes of the AC joint.  No evident rotator cuff tear. The patient has exhausted nonoperative  modalities and is electively admitted for operative intervention.    PROCEDURE: Following identification, the patient was brought to the  operating suite. Following administration of general anesthesia,  interscalene block for postop pain control, 1 g of IV vancomycin as well  as measuring a  hemoglobin A1c which was normal at 5.1. The patient was  then positioned on the operating table in the supine fashion. The left  shoulder was then examined under anesthesia. The patient was noted to  have 0-160 degrees of passive forward elevation, 40 degrees external  rotation to the side, and 60 degrees external and internal rotation in  the 90 degree abducted position. At this point, a gentle manipulation of  the left shoulder was then performed. I was able to achieve 0-180 degrees  of passive rotation, 60 degrees external rotation to the side, and 90  degrees of external and internal rotation in 90 degree abducted position.  At this point, the patient was carefully positioned  in the lateral  decubitus position, right side down. Axillary roll was placed. Bean bag  was inflated. Care was taken to pad both dependent lower extremities. The  left arm was then placed in the Dyonics traction device in 15 pounds of  traction. The left shoulder was then prepped and draped in sterile  fashion. Subacromial space was then injected with 10 mL of 1% Xylocaine  with epinephrine. Scope was introduced into the shoulder. Diagnostic  arthroscopy was then commenced. Articular surfaces of the humeral and  glenoid were visualized and noted to be intact. Anterior, posterior,  superior and inferior labrum were visualized and intact. Biceps tendon  was intact. Undersurface of the rotator cuff was visualized and intact.  There was diffuse capsulitis in the glenohumeral joint. With use of 4.5  full resector Oratec wand, all adhesions were lysed. There was abundant  mobility in the axillary recess.  The scope was then flip-flopped to the posterior portal. Posterior cuff  and labrum were intact, and again there was diffuse capsulitis and this  was resected in its entirety using a 4.5 full resector. The scope was  then introduced into the subacromial space. Lateral portal was then  established. Hypertrophic hemorrhagic bursal tissue was then  resected.  Bursal side of the cuff was visualized and intact. All adhesions were  lysed on the bursal side. At this point, using Oratec wand, acromionizing  bur, an arthroscopic subacromial decompression was then performed. This  was taken down to the level of the deltoid fascia anteriorly, AC joint  posteriorly, contoured from medial to lateral. Once that was complete,  our attention was then returned to resecting the distal clavicle. Distal  10 mm and 10 mm of distal clavicle was then resected. Care was taken to  preserve the posterior superior capsule.    At this point, again, the rotator cuff was intact on the bursal side and  all adhesions were lysed. The procedure was then complete. Arthroscopic  equipment was removed from the shoulder. The portals were reapproximated  using 2-0 nylon horizontal mattress sutures. A sterile dressing was  applied, sling and swathe was applied. The patient was then expeditiously  extubated and transferred to the recovery room in stable condition.                Orvis Brill, MD                This is an unverified document unless signed by physician.    TID:  [id number]                                      DT:  05/31/2011  1:59 P  JOB:  [id number]        DOC#:  [id number]           DD:  05/31/2011    cc:   Orvis Brill, MD

## 2011-05-31 NOTE — Progress Notes (Signed)
Resting comfortably,dsng D/I.NV status WDL.Rates pain 5/10.c/o itching.Dsng dry and intact.Neurovascular status remains WDL. Cryocuff in use L arm and shoulder. Family member at bedside. Instructed to call for assist before getting out of bed. Pt verbalized understanding. Call light within reach.

## 2011-05-31 NOTE — Other (Signed)
Patient's husband updated on patient's condition and that she is still very sleepy.  Patient has taken sips of ginger ale and has used incentive spirometer with encouragement

## 2011-05-31 NOTE — Consults (Signed)
HOSPITALIST H&P/CONSULT  NAME:  Sandra Harmon   Age:  56 y.o.  DOB:   1955/12/01   MRN:   [id number]  PCP: Sol Blazing, MD  Consulting MD: Dr Lennart Pall  Treatment Team: Attending Provider: Orvis Brill., MD; Consulting Provider: Alroy Bailiff, MD  HPI:   Patient is a 56 yo female admitted by Dr Melanee Left and is s/p left shoulder arthroplasty. Patient developed post-op chest pain and the hospitalist have been consulted. Patient describes pain and dull non-radiating pain rated at 4/10. Pain is worsened by deep breathing, cough, and sternal palpation. She reports post-op nausea as well that has been relieved by phenergan. She denies SOB or diaphoresis. She denies prior cardiac history and states that she had a normal cardiac cath 5 years ago at [facility]. Denies history of diabetes.       Complete ROS done and is as stated in HPI or otherwise negative  Past Medical History   Diagnosis Date   ??? Thyroid disease      hypothyroid   ??? Arthritis       Past Surgical History   Procedure Date   ??? Hx appendectomy    ??? Hx cholecystectomy    ??? Hx cervical diskectomy 09/16/06     Anterior C5-C6 diskectomy with decompression;  anterior cervical arthrodesis interbody technique;  anterior cervical instrumentation;  tricortical iliac crest bone graft harvested through a separate incision.    ??? Hx tah and bso       Prior to Admission Medications   Medication Last Dose Informant Patient Reported? Taking?   levothyroxine (SYNTHROID) 75 mcg tablet 05/31/2011 at Unknown  Yes Yes   Take 75 mcg by mouth Daily (before breakfast). Take / use AM day of surgery with a sip of water per anesthesia protocols.   traMADol (ULTRAM) 50 mg tablet 05/31/2011 at Unknown  Yes Yes   Take 50 mg by mouth every eight (8) hours as needed. Take / use AM day of surgery with a sip of water per anesthesia protocols.   sulfacetamide (BLEPH-10) 10 % ophthalmic solution 05/30/2011 at Unknown  Yes Yes   Administer 2 Drops to both eyes four (4) times daily.   diclofenac EC  (VOLTAREN) 75 mg EC tablet 05/30/2011 at Unknown  Yes Yes   Take 75 mg by mouth two (2) times a day.   diclofenac (VOLTAREN) 1 % topical gel 05/30/2011 at Unknown  Yes Yes   Apply 2 g to affected area four (4) times daily.   cyclobenzaprine (FLEXERIL) 10 mg tablet 05/24/2011 at Unknown  Yes Yes   Take 10 mg by mouth three (3) times daily as needed.          Allergies   Allergen Reactions   ??? Lipitor (Atorvastatin) Other (comments)     Muscle pain   ??? Lortab (Hydrocodone-Acetaminophen) Itching   ??? Penicillins Hives      History   Substance Use Topics   ??? Smoking status: Former Smoker -- 1.0 packs/day for 15 years     Quit date: 10/25/1996   ??? Smokeless tobacco: Not on file   ??? Alcohol Use: No      Family History   Problem Relation Age of Onset   ??? Lung Disease Mother    ??? Asthma Mother    ??? Liver Disease Father    ??? Stroke Brother       Objective:   BP 133/92   Pulse 96   Temp 95.3 ??F (  35.2 ??C)   Resp 18   Ht 5' (1.524 m)   Wt 74.844 kg (165 lb)   BMI 32.22 kg/m2   SpO2 100%   Temp (24hrs), Avg:96.7 ??F (35.9 ??C), Min:95.3 ??F (35.2 ??C), Max:98 ??F (36.7 ??C)    Oxygen Therapy  O2 Sat (%): 100 % (05/31/11 1626)  O2 Device: Nasal cannula (05/31/11 1600)  O2 Flow Rate (L/min): 2 l/min (05/31/11 1600)  Physical Exam:  General:    Alert, cooperative, no distress, appears stated age.     Head:   Normocephalic, without obvious abnormality, atraumatic.  Nose:  Nares normal. No drainage or sinus tenderness.  Lungs:   Clear to auscultation bilaterally.  No Wheezing or Rhonchi. No rales.  Heart:   Regular rate and rhythm,  no murmur, rub or gallop. Sternal tenderness to palpation   Abdomen:   Soft, non-tender. Not distended.  Bowel sounds normal. No masses  Extremities: No cyanosis.  No edema. No clubbing  Skin:     Texture, turgor normal. No rashes or lesions.  Not Jaundiced  Neurologic: Alert and oriented    Data Review:   Recent Results (from the past 24 hour(s))   HEMOGLOBIN A1C    Collection Time    05/31/11  7:25 AM       Component  Value Range    Hemoglobin A1c 5.0  4.8 - 6.0 %   METABOLIC PANEL, BASIC    Collection Time    05/31/11 11:21 AM       Component Value Range    Sodium 140  136 - 145 MMOL/L    Potassium 3.8  3.5 - 5.1 MMOL/L    Chloride 109 (*) 98 - 107 MMOL/L    CO2 20 (*) 21 - 32 MMOL/L    Anion gap 11  7 - 16 mmol/L    Glucose 106 (*) 65 - 100 MG/DL    BUN 8  6 - 23 MG/DL    Creatinine 2.95  0.6 - 1.0 MG/DL    GFR est AA >62  >13 ml/min/1.6m2    GFR est non-AA >60  >60 ml/min/1.68m2    Calcium 7.2 (*) 8.3 - 10.4 MG/DL   TROPONIN I    Collection Time    05/31/11  3:20 PM       Component Value Range    Troponin-I, Qt. <0.04  0.02 - 0.05 NG/ML         Assessment and Plan:     Active Hospital Problems   Diagnoses Date Noted   ??? Adhesive capsulitis of shoulder 05/31/2011   ??? Primary localized osteoarthrosis, shoulder region 05/31/2011   ??? Chest pain 05/31/2011     ??  follow up serial troponins. Suspect pain is musculoskeletal       Supervising Physician: Dr Berneta Levins, PA

## 2011-05-31 NOTE — H&P (Signed)
Date of Surgery Update:  Sandra Harmon was seen and examined.  History and physical has been reviewed. There have been no significant clinical changes since the completion of the originally dated History and Physical.    Signed By: Orvis Brill, MD     May 31, 2011 6:36 AM

## 2011-05-31 NOTE — Progress Notes (Signed)
Patient complains of post op chest pain  Post op x ray no pneumo  Will check labs  hospitalist consult  Will discharge home in am if stable

## 2011-05-31 NOTE — Other (Signed)
Left shoulder xray completed at 1112 and blood drawn at 1121 and sent to lab

## 2011-05-31 NOTE — Op Note (Signed)
[facility]                           [address]                            , [place] [address]                                [phone]                                OPERATIVE REPORT  ________________________________________________________________________  NAME:  Sandra Harmon, Sandra Harmon                        MR:  [id number]  LOC:  W3O [id number]             SEX:  F               ACCT:  [id number]  DOB:  03/15/1955            AGE:  56              PT:  T  ADMIT:  05/31/2011          DSCH:  06/01/2011     MSV:  SUR  ________________________________________________________________________      DATE OF SURGERY: 05/31/2011    PREPROCEDURE DIAGNOSES  1. Rotator cuff tendonitis.  2. Acromioclavicular joint arthritis, left shoulder.    POSTPROCEDURE DIAGNOSES  1. Adhesive capsulitis, left shoulder.  2. Acromioclavicular joint arthritis, left shoulder.    NAME OF PROCEDURE  1. Examination and manipulation of left shoulder.  2. Arthroscopy, left shoulder.  3. Arthroscopic subacromial decompression.  4. Arthroscopic distal clavicle resection.  5. Lysis of adhesions.    SURGEON: Tomeika Weinmann G. Posta Jr, MD    ANESTHESIA: General, interscalene block.    CPT CODES:  29826, 29825, 29824.  ICD-9 CODES: 726.0, 715.11.    PATHOLOGY  1. Type 2 acromion.  2. Acromioclavicular joint arthritis.  3. Capsulitis.    INDICATIONS: The patient is a 56-year-old female who has developed a  sore, painful, stiff left shoulder. Preoperative physical examination and  MR demonstrated a type 2 acromion, degenerative changes of the AC joint.  No evident rotator cuff tear. The patient has exhausted nonoperative  modalities and is electively admitted for operative intervention.    PROCEDURE: Following identification, the patient was brought to the  operating suite. Following administration of general anesthesia,  interscalene block for postop pain control, 1 g of IV vancomycin as well  as measuring a  hemoglobin A1c which was normal at 5.1. The patient was  then positioned on the operating table in the supine fashion. The left  shoulder was then examined under anesthesia. The patient was noted to  have 0-160 degrees of passive forward elevation, 40 degrees external  rotation to the side, and 60 degrees external and internal rotation in  the 90 degree abducted position. At this point, a gentle manipulation of  the left shoulder was then performed. I was able to achieve 0-180 degrees  of passive rotation, 60 degrees external rotation to the side, and 90  degrees of external and internal rotation in 90 degree abducted position.  At this point, the patient was carefully positioned   in the lateral  decubitus position, right side down. Axillary roll was placed. Bean bag  was inflated. Care was taken to pad both dependent lower extremities. The  left arm was then placed in the Dyonics traction device in 15 pounds of  traction. The left shoulder was then prepped and draped in sterile  fashion. Subacromial space was then injected with 10 mL of 1% Xylocaine  with epinephrine. Scope was introduced into the shoulder. Diagnostic  arthroscopy was then commenced. Articular surfaces of the humeral and  glenoid were visualized and noted to be intact. Anterior, posterior,  superior and inferior labrum were visualized and intact. Biceps tendon  was intact. Undersurface of the rotator cuff was visualized and intact.  There was diffuse capsulitis in the glenohumeral joint. With use of 4.5  full resector Oratec wand, all adhesions were lysed. There was abundant  mobility in the axillary recess.  The scope was then flip-flopped to the posterior portal. Posterior cuff  and labrum were intact, and again there was diffuse capsulitis and this  was resected in its entirety using a 4.5 full resector. The scope was  then introduced into the subacromial space. Lateral portal was then  established. Hypertrophic hemorrhagic bursal tissue was then  resected.  Bursal side of the cuff was visualized and intact. All adhesions were  lysed on the bursal side. At this point, using Oratec wand, acromionizing  bur, an arthroscopic subacromial decompression was then performed. This  was taken down to the level of the deltoid fascia anteriorly, AC joint  posteriorly, contoured from medial to lateral. Once that was complete,  our attention was then returned to resecting the distal clavicle. Distal  10 mm and 10 mm of distal clavicle was then resected. Care was taken to  preserve the posterior superior capsule.    At this point, again, the rotator cuff was intact on the bursal side and  all adhesions were lysed. The procedure was then complete. Arthroscopic  equipment was removed from the shoulder. The portals were reapproximated  using 2-0 nylon horizontal mattress sutures. A sterile dressing was  applied, sling and swathe was applied. The patient was then expeditiously  extubated and transferred to the recovery room in stable condition.                Kylieann Eagles G Posta, Jr, MD                This is an unverified document unless signed by physician.    TID:  [id number]                                      DT:  05/31/2011  1:59 P  JOB:  [id number]        DOC#:  [id number]           DD:  05/31/2011    cc:   Dasie Chancellor G Posta, Jr, MD

## 2011-05-31 NOTE — Progress Notes (Signed)
TRANSFER - IN REPORT:    Verbal report received from [facility] RN @ [phone] (name) on Taronda Comacho  being received from PACU (unit) for routine post - op      Report consisted of patient???s Situation, Background, Assessment and   Recommendations(SBAR).     Information from the following report(s) SBAR, Kardex, [facility] and Recent Results was reviewed with the receiving nurse.    Opportunity for questions and clarification was provided.      Assessment completed upon patient???s arrival to unit room 320 and care assumed.

## 2011-05-31 NOTE — Op Note (Deleted)
[facility]                           [address]                            [place], [place] [address]                                [phone]                                OPERATIVE REPORT  ________________________________________________________________________  NAME:  Sandra Harmon, Sandra Harmon                        MR:  [id number]  LOC:  SFOP                  SEX:  F               ACCT:  [id number]  DOB:  1955-06-21            AGE:  56              PT:  B  ADMIT:  06/03/2011          DSCH:                 MSV:  MED  ________________________________________________________________________      DATE OF SURGERY: 05/31/2011    PREPROCEDURE DIAGNOSES  1. Rotator cuff tendonitis.  2. Acromioclavicular joint arthritis, left shoulder.    POSTPROCEDURE DIAGNOSES  1. Adhesive capsulitis, left shoulder.  2. Acromioclavicular joint arthritis, left shoulder.    NAME OF PROCEDURE  1. Examination and manipulation of left shoulder.  2. Arthroscopy, left shoulder.  3. Arthroscopic subacromial decompression.  4. Arthroscopic distal clavicle resection.  5. Lysis of adhesions.    SURGEON: Tandy Gaw. Raynald Kemp, MD    ANESTHESIA: General, interscalene block.    CPT CODES:  [id number], [id number], [id number].  ICD-9 CODES: 726.0, 715.11.    PATHOLOGY  1. Type 2 acromion.  2. Acromioclavicular joint arthritis.  3. Capsulitis.    INDICATIONS: The patient is a 56 year old female who has developed a  sore, painful, stiff left shoulder. Preoperative physical examination and  MR demonstrated a type 2 acromion, degenerative changes of the AC joint.  No evident rotator cuff tear. The patient has exhausted nonoperative  modalities and is electively admitted for operative intervention.    PROCEDURE: Following identification, the patient was brought to the  operating suite. Following administration of general anesthesia,  interscalene block for postop pain control, 1 g of IV vancomycin as well  as measuring a hemoglobin A1c  which was normal at 5.1. The patient was  then positioned on the operating table in the supine fashion. The left  shoulder was then examined under anesthesia. The patient was noted to  have 0-160 degrees of passive forward elevation, 40 degrees external  rotation to the side, and 60 degrees external and internal rotation in  the 90 degree abducted position. At this point, a gentle manipulation of  the left shoulder was then performed. I was able to achieve 0-180 degrees  of passive rotation, 60 degrees external rotation to the side, and 90  degrees of external and internal rotation  in 90 degree abducted position.  At this point, the patient was carefully positioned in the lateral  decubitus position, right side down. Axillary roll was placed. Bean bag  was inflated. Care was taken to pad both dependent lower extremities. The  left arm was then placed in the Dyonics traction device in 15 pounds of  traction. The left shoulder was then prepped and draped in sterile  fashion. Subacromial space was then injected with 10 mL of 1% Xylocaine  with epinephrine. Scope was introduced into the shoulder. Diagnostic  arthroscopy was then commenced. Articular surfaces of the humeral and  glenoid were visualized and noted to be intact. Anterior, posterior,  superior and inferior labrum were visualized and intact. Biceps tendon  was intact. Undersurface of the rotator cuff was visualized and intact.  There was diffuse capsulitis in the glenohumeral joint. With use of 4.5  full resector Oratec wand, all adhesions were lysed. There was abundant  mobility in the axillary recess.  The scope was then flip-flopped to the posterior portal. Posterior cuff  and labrum were intact, and again there was diffuse capsulitis and this  was resected in its entirety using a 4.5 full resector. The scope was  then introduced into the subacromial space. Lateral portal was then  established. Hypertrophic hemorrhagic bursal tissue was then resected.   Bursal side of the cuff was visualized and intact. All adhesions were  lysed on the bursal side. At this point, using Oratec wand, acromionizing  bur, an arthroscopic subacromial decompression was then performed. This  was taken down to the level of the deltoid fascia anteriorly, AC joint  posteriorly, contoured from medial to lateral. Once that was complete,  our attention was then returned to resecting the distal clavicle. Distal  10 mm and 10 mm of distal clavicle was then resected. Care was taken to  preserve the posterior superior capsule.    At this point, again, the rotator cuff was intact on the bursal side and  all adhesions were lysed. The procedure was then complete. Arthroscopic  equipment was removed from the shoulder. The portals were reapproximated  using 2-0 nylon horizontal mattress sutures. A sterile dressing was  applied, sling and swathe was applied. The patient was then expeditiously  extubated and transferred to the recovery room in stable condition.                Orvis Brill, MD                This is an unverified document unless signed by physician.    TID:  [id number]                                      DT:  05/31/2011  1:59 P  JOB:  [id number]        DOC#:  [id number]           DD:  05/31/2011    cc:   Orvis Brill, MD

## 2011-05-31 NOTE — Progress Notes (Signed)
Blood pressure 132/83, pulse 60, temperature 98 ??F (36.7 ??C), resp. rate 16, height 5' (1.524 m), weight 74.844 kg (165 lb), SpO2 91.00%. Vital signs and neurological status are back to preoperative baseline and stable. The patient is alert and oriented. Pain control is satisfactory. The patient appears adequately hydrated. No anesthetic complications are noted. Post-operative follow up to be provided by Surgeon. DC if discharge criteria met.

## 2011-05-31 NOTE — Other (Signed)
TRANSFER - OUT REPORT:    Verbal report given to Bing Quarry, RN on Sandra Harmon  being transferred to 320 for routine post - op       Report consisted of patient???s Situation, Background, Assessment and   Recommendations(SBAR).     Information from the following report(s) OR Summary, Procedure Summary, Intake/Output and MAR was reviewed with the receiving nurse.    Opportunity for questions and clarification was provided.

## 2011-05-31 NOTE — Consults (Addendum)
Pt seen and examined.  Agree w plan of care.  Pain is reproducible, likely related to extravasion of fluid into subcutaneous chest.  Doubt acs, but check serial troponin, and ekg in 8 hours.  No anticouagulation at this point.  Limit qt prolongating meds, including phenergan.  Repeat ekg in am.

## 2011-05-31 NOTE — Progress Notes (Signed)
Anesthesiology ISB Procedure Note    Risks, benefits, and alternatives of interscalene nerve block discussed with patient and family.  Patient elects to proceed.  Routine monitors, oxygen cannula, and appropriate sedation used. Patient sedated but responsive to questions during block. The left neck was prepped with chlorhexidine.  1% lidocaine used as local over interscalene groove. Stimuplex needle passed and biceps response illicited at 0.4 ma. 30 cc of 0.50% Bupivacaine  with 1:200k epinephrine plus 5 cc of 2% Lidocaine without epinephrine injected. Patient tolerated the procedure well. Ultrasound guided with image for post op pain assisted by Dr. Margarette Asal.

## 2011-06-01 LAB — METABOLIC PANEL, BASIC
Anion gap: 10 mmol/L (ref 7–16)
BUN: 7 MG/DL (ref 6–23)
CO2: 23 MMOL/L (ref 21–32)
Calcium: 8.5 MG/DL (ref 8.3–10.4)
Chloride: 112 MMOL/L — ABNORMAL HIGH (ref 98–107)
Creatinine: 0.8 MG/DL (ref 0.6–1.0)
GFR est AA: >60 mL/min/{1.73_m2} (ref >60)
GFR est non-AA: >60 mL/min/{1.73_m2} (ref >60)
Glucose: 80 MG/DL (ref 65–100)
Potassium: 4 MMOL/L (ref 3.5–5.1)
Sodium: 145 MMOL/L (ref 136–145)

## 2011-06-01 LAB — CBC W/O DIFF
HCT: 35.3 % — ABNORMAL LOW (ref 35.8–46.3)
HGB: 11.8 g/dL (ref 11.7–15.4)
MCH: 30 PG (ref 26.1–32.9)
MCHC: 33.4 g/dL (ref 31.4–35.0)
MCV: 89.8 FL (ref 79.6–97.8)
MPV: 10.1 FL — ABNORMAL LOW (ref 10.8–14.1)
PLATELET: 200 10*3/uL (ref 150–450)
RBC: 3.93 M/uL — ABNORMAL LOW (ref 4.05–5.25)
RDW: 13.3 % (ref 11.9–14.6)
WBC: 7.7 10*3/uL (ref 4.3–11.1)

## 2011-06-01 LAB — MAGNESIUM: Magnesium: 1.6 MG/DL — ABNORMAL LOW (ref 1.8–2.4)

## 2011-06-01 LAB — TROPONIN I
Troponin-I, Qt.: <0.04 NG/ML (ref 0.02–0.05)
Troponin-I, Qt.: <0.04 NG/ML (ref 0.02–0.05)

## 2011-06-01 MED ADMIN — oxyCODONE IR (ROXICODONE) tablet 15 mg: ORAL | @ 08:00:00 | NDC 68084035411

## 2011-06-01 MED ADMIN — oxyCODONE IR (ROXICODONE) tablet 15 mg: ORAL | @ 12:00:00 | NDC 68084035411

## 2011-06-01 MED ADMIN — oxyCODONE IR (ROXICODONE) tablet 15 mg: ORAL | @ 05:00:00 | NDC 68084035411

## 2011-06-01 MED ADMIN — enoxaparin (LOVENOX) injection 30 mg: SUBCUTANEOUS | @ 12:00:00 | NDC 00075062430

## 2011-06-01 MED ADMIN — diphenhydrAMINE (BENADRYL) injection 12.5 mg: INTRAVENOUS | @ 04:00:00 | NDC 63323066401

## 2011-06-01 MED ADMIN — diphenhydrAMINE (BENADRYL) injection 12.5 mg: INTRAVENOUS | @ 10:00:00 | NDC 63323066401

## 2011-06-01 MED ADMIN — docusate sodium (COLACE) capsule 100 mg: ORAL | @ 12:00:00 | NDC 62584068311

## 2011-06-01 MED ADMIN — diphenhydrAMINE (BENADRYL) capsule 25 mg: ORAL | @ 14:00:00 | NDC 00904530661

## 2011-06-01 MED ADMIN — oxyCODONE IR (ROXICODONE) tablet 15 mg: ORAL | @ 16:00:00 | NDC 68084035411

## 2011-06-01 MED ADMIN — magnesium oxide (MAG-OX) tablet 400 mg: ORAL | @ 14:00:00 | NDC 63739035410

## 2011-06-01 MED ADMIN — oxyCODONE IR (ROXICODONE) tablet 15 mg: ORAL | @ 02:00:00 | NDC 68084035411

## 2011-06-01 MED FILL — MAGOX 400 MG (241.3 MG MAGNESIUM) TABLET: 400 mg (241.3 mg magnesium) | ORAL | Qty: 1

## 2011-06-01 MED FILL — OXYCODONE 5 MG TAB: 5 mg | ORAL | Qty: 3

## 2011-06-01 MED FILL — LOVENOX 30 MG/0.3 ML SUB-Q SYRINGE: 30 mg/0.3 mL | SUBCUTANEOUS | Qty: 0.3

## 2011-06-01 MED FILL — DIPHENHYDRAMINE HCL 50 MG/ML IJ SOLN: 50 mg/mL | INTRAMUSCULAR | Qty: 1

## 2011-06-01 MED FILL — DIPHENHYDRAMINE 25 MG CAP: 25 mg | ORAL | Qty: 1

## 2011-06-01 MED FILL — VANCOMYCIN 1,000 MG IV SOLR: 1000 mg | INTRAVENOUS | Qty: 1000

## 2011-06-01 MED FILL — DOCUSATE SODIUM 100 MG CAP: 100 mg | ORAL | Qty: 1

## 2011-06-01 MED FILL — HYDROMORPHONE (PF) 1 MG/ML IJ SOLN: 1 mg/mL | INTRAMUSCULAR | Qty: 1

## 2011-06-01 MED FILL — ZOLPIDEM 10 MG TAB: 10 mg | ORAL | Qty: 1

## 2011-06-01 NOTE — Progress Notes (Signed)
In bed,awake,alert,pain level=9/10 as stated,,IV access intact and patent on right hand with fluids infusing,,right shoulder  dressing intact,to be changed after therapy,,arm sling on,strong radial pulse,wiggles fingers without difficulty,given all p.o.medications plus three tablets Oxycodone (5mg s.each),prn for pain,,immediate needs attended to by her husband.Marland KitchenMarland Kitchen

## 2011-06-01 NOTE — Progress Notes (Signed)
Discharge instructions given to patient and her husband,both given opportunity for questions and for clarifications,,also patient has four prescriptions that Dr.Posta has given yesterday (prescriptions for Oxycodone,Phenergan,Toradol and- Ambien),,patient wearing arm sling,,discharge patient per wheelchair,stable condition,accompanied by her husband...Marland KitchenMarland Kitchen

## 2011-06-01 NOTE — Discharge Summary (Signed)
Total Joint Discharge Summary      Patient ID:  Sandra Harmon  [id number]  55 y.o.  Jan 09, 1956    Admit date: 05/31/2011    Discharge date and time: 06/01/11     Admitting Physician: Orvis Brill., MD     Discharge Physician: Orvis Brill, MD      Admission Diagnoses: [facility] JOINT ARTHRITIS  Adhesive capsulitis of shoulder    Discharge Diagnoses: Principal Problem:   *Adhesive capsulitis of shoulder (05/31/2011)  Active Problems:     Primary localized osteoarthrosis, shoulder region (05/31/2011)     Chest pain (05/31/2011)     Hypomagnesemia (06/01/2011)      Surgeon: Orvis Brill, MD                                Perioperative Antibiotics: Ancef  ___                                                Vancomycin  _x__          Post Op complications: none        Discharged to: Home    Discharge instructions:  -Resume pre hospital diet             -Resume home medications per medical continuation form     Sling left shoulder  Continue physical therapy  -Follow up in office as scheduled       Signed:  Orvis Brill, MD  06/01/2011  8:55 AM

## 2011-06-01 NOTE — Progress Notes (Signed)
Given prn medication for pain level=6/10,,dressing removed on left shoulder,,noted three small incision sites,no bruising  no drainage,sutures all intact,,small amount of swelling noted,,re-dressed with sterile 4x4's and tegaderms,arm placed back in sling,,husband was given instructions about dressing changes and also was given a few dressing materials to take home,,IV access taken out...Marland KitchenMarland Kitchen

## 2011-06-01 NOTE — Progress Notes (Signed)
PROGRESS NOTE    June 01, 2011  Admit Date:   05/31/2011    Post Op day: 1 Day Post-Op    Subjective:    Ronni Rumble     PT/OT:                    Vital Signs:    Patient Vitals for the past 8 hrs:   BP Temp Pulse Resp SpO2   06/01/11 0500 137/89 mmHg 97.9 ??F (36.6 ??C) 92  18  95 %   06/01/11 0056 132/91 mmHg 97.3 ??F (36.3 ??C) 95  20  97 %     Temp (24hrs), Avg:97 ??F (36.1 ??C), Min:95.3 ??F (35.2 ??C), Max:97.9 ??F (36.6 ??C)      Pain Control:   Pain Assessment  Pain Scale 1: Numeric (0 - 10)  Pain Intensity 1: 4  Pain Onset 1: at rest  Pain Location 1: Shoulder  Pain Orientation 1: Left  Pain Description 1: Aching  Pain Intervention(s) 1: Medication (see MAR)    Meds:    Current Facility-Administered Medications   Medication Dose Route Frequency   ??? vancomycin (VANCOCIN) 1,000 mg in 0.9% sodium chloride (MBP/ADV) 250 mL adv  1,000 mg IntraVENous ONCE   ??? famotidine (PEPCID) tablet 20 mg  20 mg Oral ONCE   ??? fentaNYL citrate (PF) injection 100 mcg  100 mcg IntraVENous ONCE   ??? midazolam (VERSED) injection 2 mg  2 mg IntraVENous ONCE   ??? promethazine (PHENERGAN) injection 6.25 mg  6.25 mg IntraVENous Multiple   ??? ondansetron (ZOFRAN) injection 4 mg  4 mg IntraVENous ONCE   ??? 0.9% sodium chloride infusion  75 mL/hr IntraVENous CONTINUOUS   ??? acetaminophen (TYLENOL) tablet 650 mg  650 mg Oral Q4H PRN   ??? HYDROmorphone (PF) (DILAUDID) injection 1 mg  1 mg IntraVENous Q1H PRN   ??? ondansetron (ZOFRAN) injection 4 mg  4 mg IntraVENous Q4H PRN   ??? diphenhydrAMINE (BENADRYL) injection 12.5 mg  12.5 mg IntraVENous Q4H PRN   ??? acetaminophen (TYLENOL) tablet 650 mg  650 mg Oral Q4H PRN   ??? bisacodyl (DULCOLAX) suppository 10 mg  10 mg Rectal DAILY PRN   ??? sodium phosphate (FLEET'S) enema 118 mL  1 Enema Rectal PRN   ??? docusate sodium (COLACE) capsule 100 mg  100 mg Oral BID   ??? oxyCODONE IR (ROXICODONE) tablet 5 mg  5 mg Oral Q3H PRN   ??? oxyCODONE IR (ROXICODONE) tablet 10 mg  10 mg Oral Q3H PRN   ??? oxyCODONE IR (ROXICODONE)  tablet 15 mg  15 mg Oral Q3H PRN   ??? zolpidem (AMBIEN) tablet 10 mg  10 mg Oral QHS PRN   ??? HYDROmorphone (DILAUDID) 20 mg / 20 mL PCA   IntraVENous CONTINUOUS   ??? ondansetron (ZOFRAN ODT) tablet 8 mg  8 mg Oral Q6H PRN   ??? vancomycin (VANCOCIN) 1,000 mg in 0.9% sodium chloride (MBP/ADV) 250 mL adv  1,000 mg IntraVENous Q12H   ??? enoxaparin (LOVENOX) injection 30 mg  30 mg SubCUTAneous Q24H       LAB:    Recent Labs   Basename 06/01/11 0520    HCT 35.3*    HGB 11.8    INR --       24 Hour Assessment Issues:    Oriented    Discharge Planning: HOME    Assessment & Physician's Comment:  Neurovascular checks within normal limits    Principal Problem:   *  Adhesive capsulitis of shoulder (05/31/2011)  Active Problems:     Primary localized osteoarthrosis, shoulder region (05/31/2011)     Chest pain (05/31/2011)      Plan:home today      Eddie North III, MD

## 2011-06-01 NOTE — Progress Notes (Signed)
Problem: Mobility Impaired (Adult and Pediatric)  Goal: *Acute Goals and Plan of Care (Insert Text)  SHORT TERM FUNCTIONAL GOALS:  (1.) Patient will move from supine to sit and sit to supine in bed with SUPERVISION within 1-3 days.   (2.) Patient will transfer from bed to chair and chair to bed with SUPERVISION using the least restrictive device within 1-3 days.   (3.) Patient will ambulate with SUPERVISION for 75 feet with the least restrictive device within 1-3 days.     LONG TERM FUNCTIONAL GOALS:  (1.) Patient will move from supine to sit and sit to supine in bed with INDEPENDENT within 4-6 days.   (2.) Patient will transfer from bed to chair and chair to bed with INDEPENDENT using the least restrictive device within 4-6 days.   (3.) Patient will ambulate with INDEPENDENT for 150 feet with the least restrictive device within 4-6 days.   (4.) Patient will be independent with shoulder HEP to increase range of motion per MD orders.  ________________________________________________________________________________________________  PHYSICAL THERAPY: INITIAL ASSESSMENT, TREATMENT DAY: 1ST AND AM    NAME/AGE/GENDER: Sandra Harmon is a 56 y.o. female  DATE: 06/01/2011  PRIMARY DIAGNOSIS: AC JOINT ARTHRITIS  Adhesive capsulitis of shoulder  Procedure(s) (LRB):  SHOULDER ARTHROSCOPY (Left)  MANIPULATION (Left) 1 Day Post-Op  Treatment Diagnosis: pain in joint shoulder region  INTERDISCIPLINARY COLLABORATION: Registered Nurse  ASSESSMENT:  Sandra Harmon presented with poor control status post manipulation of left shoulder. Pt had limited functional strength & ROM @ left shoulder. Pt was shown mild stretching, PROM & AAROM HEP with written guidelines. Demonstrated use of over door pulley.    Precautions/Treatment Guidelines: Aggressive ROM, No restrictions  ????????This section established at most recent assessment??????????   PROBLEM LIST (Impairments causing functional limitations):  1. Decreased Independence with Bed  Mobility   2. Decreased Independence with Transfers   3. Decreased Independence with Ambulation   4. Decreased Independence with shoulder HEP   REHABILITATION POTENTIAL FOR STATED GOALS: GOOD  PLAN OF CARE:  INTERVENTIONS PLANNED: (Benefits and precautions of physical therapy have been discussed with the patient.)  1. Bed Mobility Training   2. Transfer Training   3. Gait Training   4. Therapeutic Exercises per MD orders/protocols   5. Modalities for Pain   FREQUENCY/DURATION: Follow patient twice daily for duration of hospital stay in order to address above goals.    RECOMMENDED REHABILITATION/EQUIPMENT: (at time of discharge pending progress):   Outpatient: Physical Therapy.  SUBJECTIVE:  Pt was aggreeable to therapy  History of Present Injury/Illness:   ROTATOR CUFF TENDINITIS LEFT SHOULDER   AC OA LEFT SHOULDER   Postoperative Diagnosis: ADHESIVE CAPSULITIS LEFT SHOULDER   AC OA LEFT SHOULDER   Procedure: EXAMINATION AND MANIPULATION LEFT SHOULDER ARTHROSCOPY LEFT SHOULDER ARTHROSCOPIC SUBACROMIAL DECOMPRESSION, DISTAL CLAVICLE RESECTION, LYSIS OF ADHESIONS  Past Medical History    Diagnosis  Date    ???  Hypercholesteremia      ???  Hypothyroidism      ???  HTN      ???  Endocrine disease          Past Surgical History    Procedure  Date    ???  Hx appendectomy      ???  Hx cholecystectomy      ???  Hx tah and bso      ???  Hx orthopaedic          neck repain with L hip graft  Present Symptoms: pain & debility @ left shoulder  Pain Intensity 1: 10  Pain Location 1: Shoulder  Pain Orientation 1: Left  Pain Intervention(s) 1: Medication (see MAR);Cold pack;Nurse notified;Repositioned  Prior Level of Function/Home Situation: pt was functional with painful & impaired functio @ left shoulder for the last 7 months  Home Environment: Private residence  # Steps to Enter: 3   Rails to Enter: Yes  One/Two Story Residence: One story  Living Alone: No  Support Systems: Spouse  Patient Expects to be Discharged to:: Private residence   Current DME Used/Available at Home: None  OBJECTIVE/TREATMENT:      Most Recent Physical Functioning:   Gross Assessment: Both legs & right UE strength & ROM were WFL         Balance:  Sitting: Intact;Without support  Standing: Impaired;Without support  Bed Mobility:  Supine to Sit:  (SBA)  Sit to Supine:  (SBA)  Wheelchair Mobility:     Transfers:  Sit to Stand:  (SBA)  Stand to Sit:  (SBA)  Gait:  Gait Abnormalities: Decreased step clearance;Shuffling gait  Ambulation - Level of Assistance: SBA  Distance (ft): 160 Feet (ft)  Assistive Device:  (none)               (In addition to Assessment/Re-Assessment sessions the following treatments were rendered)    Therapeutic Exercise: (15 Minutes):  Exercises per grid below to improve mobility and dynamic movement of shoulder - left to improve functional range.  Required minimal verbal cues to encourage proper exercise technique.  Progressed range and repetitions as indicated. Hand out issued.  Assessment/ 15 min        Date:   06/01/11 Date:     Date:       ACTIVITY/EXERCISE AM PM AM PM AM PM   Gripping 20             Wrist Flexion/Extension 20             Wrist Ulnar/Radial Deviation               Pronation/Supination 20             Elbow Flexion/Extension 20             Shoulder Flexion/Extension Flex 20aa             Shoulder AB/ADduction 20aa             Shoulder IR/ER 20aa             Pulleys review             Pendulums 20 clock & counter clock             Shrugs               Isometric:                 Flexion               Extension               ABduction               ADduction               Biceps/Triceps                               B = bilateral; AA = active assistive; A = active; P =  passive    Braces/Orthotics/Lines/Other:   ?? Hand Dominance: right handed   ?? Sling: Arm (in house)   ?? IV     Safety:   After treatment position/precautions:  ?? Supine in bed   ?? Bed/Chair-wheels locked   ?? Bed in low position   ?? Caregiver at bedside   ?? Call light within  reach   ?? RN notified   ?? Family at bedside     Progression/Medical Necessity:   ?? Patient demonstrates good rehab potential due to higher previous functional level.   Compliance with Program/Exercises: noncompliant some of the time, Will assess as treatment progresses.   Reason for Continuation of Services/Other Comments:  ?? Patient continues to require skilled intervention due to limited functioanl mobility @ left shoulder & unsteady gait.   Recommendations/Intent for next treatment session: Treatment next visit will focus on reduction in assistance provided and improving functional mobility @ left shoulder.  Total Treatment Duration:  Time In: 0845  Time Out: 0915  Rhae Lerner. Ingegneri, PT

## 2011-06-01 NOTE — Undefined (Signed)
Pt had some post-op nausea/vomitting during first 24hrs--since resovled

## 2011-06-01 NOTE — Progress Notes (Signed)
Slept at intervals during shift.No further c/o voiced.No change in NV status noted. Has had Benadryl x3 during shift for c/o itching with Oxycodone.Does not want to take IV pain meds.Has been OOB to BR several times,voids QS.Family member remains at bedside.

## 2011-06-02 MED FILL — ONDANSETRON (PF) 4 MG/2 ML INJECTION: 4 mg/2 mL | INTRAMUSCULAR | Qty: 2

## 2011-06-02 MED FILL — PROPOFOL 10 MG/ML IV EMUL: 10 mg/mL | INTRAVENOUS | Qty: 20

## 2011-06-02 MED FILL — LACTATED RINGERS IV: INTRAVENOUS | Qty: 1000

## 2011-06-02 MED FILL — LABETALOL 5 MG/ML IV SOLN: 5 mg/mL | INTRAVENOUS | Qty: 20

## 2011-06-02 MED FILL — NEOSTIGMINE METHYLSULFATE 1 MG/ML INJECTION: 1 mg/mL | INTRAMUSCULAR | Qty: 10

## 2011-06-02 MED FILL — LIDOCAINE (PF) 20 MG/ML (2 %) IV SYRINGE: 100 mg/5 mL (2 %) | INTRAVENOUS | Qty: 5

## 2011-06-02 MED FILL — GLYCOPYRROLATE 0.2 MG/ML IJ SOLN: 0.2 mg/mL | INTRAMUSCULAR | Qty: 2

## 2011-06-03 NOTE — Progress Notes (Signed)
[facility]rapy Center at [facility]    [address] [place], [place] [address]  Phone:[phone]    Fax:[phone]    Outpatient PHYSICAL THERAPY: Initial Assessment  Fall Risk Score: 1 (? 5 = High Risk)  Treatment Diagnosis: pain of joint; shoulder   REFERRING PHYSICIAN: Lennart Pall, MD  MD Orders: eval and treat, "push ROM "   Return Physician Appointment: next week  MEDICAL/REFERRING DIAGNOSIS: s/p L shoulder scope ASD ADCR  DATE OF ONSET: 05/31/11   PRIOR LEVEL OF FUNCTION: independent   PRECAUTIONS/ALLERGIES:   Allergies   Allergen Reactions   ??? Lipitor (Atorvastatin) Other (comments)     Muscle pain   ??? Lortab (Hydrocodone-Acetaminophen) Itching   ??? Penicillins Hives       ASSESSMENT:  ????????This section established at most recent assessment??????????  Sandra Harmon is demonstrating significant functional limitation currently s/p above surgery on left shoulder.  Pt will highly benefit from skilled PT to reach maximum potential addressing below problems.      PROBLEM LIST (Impairments causing functional limitations):  1. Decreased Strength affecting function  2. Decreased ADL/Functional Activities  3. Decreased Flexibility/joint mobility  4. Increased Pain affecting function  5. Decreased Activity tolerance   6. Other decreased knowlege of HEP  GOALS: (Goals have been discussed and agreed upon with patient.)  SHORT-TERM FUNCTIONAL GOALS: Time Frame: 2-4 weeks   1. Pt will be independent with HEP focusing on ROM.  2. Pt will increase left shoulder PROM by at least 20 degrees in all planes.  3. Pt will report not needing assistance for dressing or bathing.  4. Pt will report shoulder pain is no longer constant.   5. Pt will be sleeping in regular bed with minimal to no interruptions due to shoulder pain.   DISCHARGE GOALS: Time Frame: 4-6 weeks   1. Pt will improve DASH score by at least 30%.  2. Pt will return to full work duty without limitation due to left UE.  3. Pt will demonstrate 4+/5  strength of left shoulder in all planes.   4. Pt will report pain of left shoulder does not exceed a 2/10 in 1-2 week span of time.  REHABILITATION POTENTIAL FOR STATED GOALS: GoodPLAN OF CARE:  INTERVENTIONS PLANNED: (Benefits and precautions of physical therapy have been discussed with the patient.)  1. cold  2. electrical stimulation  3. heat  4. home exercise program (HEP)  5. manual therapy  6. range of motion: active/assisted/passive  7. therapeutic activities  8. therapeutic exercise/strengthening  TREATMENT PLAN EFFECTIVE DATES: 06/03/11 TO 07/15/11  FREQUENCY/DURATION: Follow patient 2-3 times a week for 6 weeks to address above goals.  Regarding Sandra Harmon's therapy, I certify that the treatment plan above will be carried out by a therapist or under their direction.  Thank you for this referral,  Wilber Oliphant, PT     Referring Physician Signature:          Date                       SUBJECTIVE:    History of Present Injury/Illness (Reason for Referral):   05/31/11 surgery   Pt states that pain of left arm/shoulder began insidiously in September 2012.  Shoulder pain persisted and worsened for 6 months before pt saw MD for this. Above surgery 05/31/11. Pt reports significant pain and limitation since surgery.   Present Symptoms:  Sharp, constant, pain of superior shoulder pain significant after surgery.  Pt wearing sling most of time.   Aggravating Factors: reaching, bathing, dressing with left UE   Relieving factors: meds, ice   Pain: 7/10 presently,  10/10 worst, 7/10 best                 Dominant Side: left  Past Medical History: hysterectomy, cervical fusion (2007)  Current Medications: antiinflammatory, oxycodone, synthroid    Date Last Reviewed: 06/04/2011     Social History/Home Situation:   lives with husband (husband assisting patient with most ADLs at this time.      Work/Activity History:   works full time at daycare, mostly with infants     OBJECTIVE:    Outcome Measure:   Tool Used:  Disabilities of the Arm, Shoulder and Hand (DASH) Questionnaire - Quick Version  Score:  Initial: 41/55  Most Recent: X/55 (Date: -- )   Interpretation of Score: The DASH is designed to measure the activities of daily living in person's with upper extremity dysfunction or pain.  Each section is scored on a 1-5 scale, 5 representing the greatest disability.  The scores of each section are added together for a total score of 55.    Score 0 1-11 12-22 23-33 34-44 45-54 55   Modifier CH CI CJ CK CL CM CN       Carrying, Moving, and Handling Objects:    U2725 - CURRENT STATUS: CK - 40%-59% impaired, limited or restricted   D6644 - GOAL STATUS:  CI - 1%-19% impaired, limited or restricted   I3474 - D/C STATUS:  ---------------To be determined---------------      Observation/Orthostatic Postural Assessment:     ?? Pt demonstates bilateral rounded shoulders and poor overall posture.  ?? Left arm in sling  Palpation:     Tenderness of left shoulder  ROM: left limited by pain     LUE AROM  L Shoulder Flexion: 90   L Shoulder ABduction: 30   L Shoulder Internal Rotation:  (functionally to left hip)  L Shoulder External Rotation:  (functionally to left ear)  LUE PROM  L Shoulder Flexion: 90   L Shoulder ABduction: 90   L Shoulder Internal Rotation: 30  (@ 45 deg abd)  L Shoulder External Rotation: 5  (@ 45 deg abd)          RUE AROM  R Shoulder Flexion: 160   R Shoulder ABduction: 140   R Shoulder Internal Rotation:  (functionally to T8)  R Shoulder External Rotation:  (funcitonally to T3)         Strength:    left not tested at this time    RUE Strength  R Shoulder Flexion: 4+  R Shoulder ABduction: 4+  R Elbow Flexion: 4+  R Elbow Extension: 4+         Special Tests: not tested   Neurological Screen:   no symptoms of nuero involvement at this time  Functional Mobility:      unable to use left UE for most functional tasks at this time due to pain and decreased ROM.  Balance:     Good.  TREATMENT:    (In addition to  Assessment/Re-Assessment sessions the following treatments were rendered)  Education: discussed use of ice at home and in clinic.  Demo and return demo of good sitting posture in and out of sling. Discussed weaning from sling asap.   Therapeutic Exercise: ( ):  Exercises per grid below to improve mobility and strength.  Required moderate visual,  verbal and manual cues to promote proper body mechanics and promote proper body breathing techniques.  Progressed range, repetitions and complexity of movement as indicated.   Date:  06/03/11 Date:   Date:     Activity/Exercise Parameters Parameters Parameters   Wand flexion  X 5       Manual Therapy (     ):   Therapeutic Modalities:                                                                                               HEP: As above; handouts given to patient for all exercises.  ______________________________________________________________________________________________________    Treatment Assessment:   Pt reports decreased pain after above exercise.  Progression/Medical Necessity:   ?? Patient is expected to demonstrate progress in strength and range of motion to decrease assistance required with all ADLS.Marland Kitchen  Compliance with Program/Exercises: Will assess as treatment progresses.   Reason for Continuation of Services/Other Comments:  ?? Patient continues to require modification of therapeutic interventions to increase complexity of exercises.  Recommendations/Intent for next treatment session: "Treatment next visit will focus on advancements to more challenging activities".    Total Treatment Duration:  PT Patient Time In/Time Out  Time In: 1400  Time Out: 1515    Bakary Bramer A Vinal Rosengrant, PT

## 2011-06-03 NOTE — Progress Notes (Signed)
Ambulatory/Rehab Services H2 Model Falls Risk Assessment    Risk Factor Pts. ??   Confusion/Disorientation/Impulsivity  []    4 ??   Symptomatic Depression  []   2 ??   Altered Elimination  []   1 ??   Dizziness/Vertigo  []   1 ??   Gender (Female)  []   1 ??   Any administered antiepileptics (anticonvulsants):  []   2 ??   Any administered benzodiazepines:  []   1 ??   Visual Impairment (specify):  []   1 ??   Portable Oxygen Use  []   1 ??   Orthostatic ? BP  []   1 ??   History of Recent Falls (within 3 mos.)  []   5     Ability to Rise from Chair (choose one) Pts. ??   Ability to rise in a single movement  []   0 ??   Pushes up, successful in one attempt  [x]   1 ??   Multiple attempts, but successful  []   3 ??   Unable to rise without assistance  []   4   Total: (5 or greater = High Risk) 1     Falls Prevention Plan:   []                Physical Limitations to Exercise (specify):   []                Mobility Assistance Device (type):   []                Exercise/Equipment Adaptation (specify):    ??2010 [employer] All Rights Reserved. [place] Patent #7,282,031. Federal Law prohibits the replication, distribution or use without written permission from [employer]

## 2011-06-06 NOTE — Discharge Summary (Signed)
[facility]                           [address]                           [place], [place]. [address]                                [phone]                                DISCHARGE SUMMARY  ________________________________________________________________________  NAME: Sandra, Harmon                         MR:  [id number]  LOC: [id number]              SEX:  F               ACCT:  [id number]  DOB: August 11, 1955             AGE:  55              PT:  T  ADMIT: 05/31/2011           DSCH:  06/01/2011     MSV:  SUR      ADMISSION DIAGNOSES  1. Adhesive capsulitis, left shoulder.  2. Acromioclavicular joint arthritis, left shoulder.  3. Postoperative chest pain.  4. Hypomagnesemia.    Please see HPI, operative notes, and consults for details.    HOSPITAL COURSE: The patient is a 56 year old female who was admitted on  05/31/2011, underwent an uncomplicated EUA and manipulation of left  shoulder, ________, lysis of adhesions. Immediately postoperatively, she  was noted to have some chest pain. It was elected at that point to keep  her overnight. A hospitalist consult was obtained. She was ruled out for  MI. On postoperative day #1, she was much more comfortable. Her magnesium  on postoperative day #1, was 1.6. That was repleted. She was discharged  home on postoperative day #1. She will continue therapy on the outside  and followup in my office in 10 days.                Orvis Brill, MD                This is an unverified document unless signed by physician.    TID: [id number]                                       DT: 06/06/2011  4:20 P  JOB: [id number]         DOC#: [id number]            DD: 06/05/2011    cc:   Orvis Brill, MD

## 2011-06-11 NOTE — Progress Notes (Incomplete)
[facility]rapy Center at [facility]    [address], [place], [place] [address]  Phone:[phone]    Fax:[phone]    Outpatient PHYSICAL THERAPY: Daily Note  Fall Risk Score: 1 (? 5 = High Risk)     REFERRING PHYSICIAN: Lennart Pall, MD  MD Orders: eval and treat, "push ROM "   Return Physician Appointment: next week  MEDICAL/REFERRING DIAGNOSIS: s/p L shoulder scope ASD ADCR  DATE OF ONSET: 05/31/11   PRIOR LEVEL OF FUNCTION: independent   PRECAUTIONS/ALLERGIES:   Allergies   Allergen Reactions   ??? Lipitor (Atorvastatin) Other (comments)     Muscle pain   ??? Lortab (Hydrocodone-Acetaminophen) Itching   ??? Penicillins Hives       ASSESSMENT:  ????????This section established at most recent assessment??????????  Sandra Harmon is demonstrating significant functional limitation currently s/p above surgery on left shoulder.  Pt will highly benefit from skilled PT to reach maximum potential addressing below problems.      PROBLEM LIST (Impairments causing functional limitations):  1. Decreased Strength affecting function  2. Decreased ADL/Functional Activities  3. Decreased Flexibility/joint mobility  4. Increased Pain affecting function  5. Decreased Activity tolerance   6. Other decreased knowlege of HEP  GOALS: (Goals have been discussed and agreed upon with patient.)  SHORT-TERM FUNCTIONAL GOALS: Time Frame: 2-4 weeks   1. Pt will be independent with HEP focusing on ROM.  2. Pt will increase left shoulder PROM by at least 20 degrees in all planes.  3. Pt will report not needing assistance for dressing or bathing.  4. Pt will report shoulder pain is no longer constant.   5. Pt will be sleeping in regular bed with minimal to no interruptions due to shoulder pain.   DISCHARGE GOALS: Time Frame: 4-6 weeks   1. Pt will improve DASH score by at least 30%.  2. Pt will return to full work duty without limitation due to left UE.  3. Pt will demonstrate 4+/5 strength of left shoulder in all planes.   4. Pt will  report pain of left shoulder does not exceed a 2/10 in 1-2 week span of time.  REHABILITATION POTENTIAL FOR STATED GOALS: GoodPLAN OF CARE:  INTERVENTIONS PLANNED: (Benefits and precautions of physical therapy have been discussed with the patient.)  1. cold  2. electrical stimulation  3. heat  4. home exercise program (HEP)  5. manual therapy  6. range of motion: active/assisted/passive  7. therapeutic activities  8. therapeutic exercise/strengthening  TREATMENT PLAN EFFECTIVE DATES: 06/03/11 TO 07/15/11  FREQUENCY/DURATION: Follow patient 2-3 times a week for 6 weeks to address above goals.  Regarding Sandra Harmon's therapy, I certify that the treatment plan above will be carried out by a therapist or under their direction.  Thank you for this referral,  Wilber Oliphant, PT     Referring Physician Signature:          Date                       SUBJECTIVE:    History of Present Injury/Illness (Reason for Referral):   05/31/11 surgery   Pt states that pain of left arm/shoulder began insidiously in September 2012.  Shoulder pain persisted and worsened for 6 months before pt saw MD for this. Above surgery 05/31/11. Pt reports significant pain and limitation since surgery.   Present Symptoms:  Pt states that her pain is overall less and she has more mobility since beginning  wand exercises.   Pain 5/10 currently.                  Dominant Side: left  Past Medical History: hysterectomy, cervical fusion (2007)  Current Medications: antiinflammatory, oxycodone, synthroid    Date Last Reviewed: 06/11/2011     Social History/Home Situation:   lives with husband (husband assisting patient with most ADLs at this time.      Work/Activity History:   works full time at daycare, mostly with infants     OBJECTIVE:    Outcome Measure:   Tool Used: Disabilities of the Arm, Shoulder and Hand (DASH) Questionnaire - Quick Version  Score:  Initial: 41/55  Most Recent: X/55 (Date: -- )   Interpretation of Score: The DASH is designed to  measure the activities of daily living in person's with upper extremity dysfunction or pain.  Each section is scored on a 1-5 scale, 5 representing the greatest disability.  The scores of each section are added together for a total score of 55.    Score 0 1-11 12-22 23-33 34-44 45-54 55   Modifier CH CI CJ CK CL CM CN       Carrying, Moving, and Handling Objects:    V4098 - CURRENT STATUS: CK - 40%-59% impaired, limited or restricted   J1914 - GOAL STATUS:  CI - 1%-19% impaired, limited or restricted   N8295 - D/C STATUS:  ---------------To be determined---------------      Observation/Orthostatic Postural Assessment:     ?? Pt demonstates bilateral rounded shoulders and poor overall posture.  ?? Left arm in sling  Palpation:     Tenderness of left shoulder  ROM: left limited by pain                          Strength:    left not tested at this time              Special Tests: not tested   Neurological Screen:   no symptoms of nuero involvement at this time  Functional Mobility:      unable to use left UE for most functional tasks at this time due to pain and decreased ROM.  Balance:     Good.  TREATMENT:    (In addition to Assessment/Re-Assessment sessions the following treatments were rendered)  Education: discussed use of ice at home and in clinic.  Demo and return demo of good sitting posture in and out of sling. Discussed weaning from sling asap.   Therapeutic Exercise: (30 Minutes):  Exercises per grid below to improve mobility and strength.  Required moderate visual, verbal and manual cues to promote proper body mechanics and promote proper body breathing techniques.  Progressed range, repetitions and complexity of movement as indicated.   Date:  06/11/11 Date:   Date:     Activity/Exercise Parameters Parameters Parameters   Wand flexion  X 5     pulleys X 20 flex and abd      UBE 2/2 level 1     Wand ER X 10      iso ER and IR with manual resist       Manual Therapy (     ):   PROM all planes left GH     Therapeutic Modalities:                        Left Shoulder Cold  Duration :  10 minutes                                                                      HEP: As above; handouts given to patient for all exercises.  ______________________________________________________________________________________________________    Treatment Assessment:   Great improvement with AROM of left shoulder since last visit.   Progression/Medical Necessity:   ?? Patient is expected to demonstrate progress in strength and range of motion to decrease assistance required with all ADLS.Marland Kitchen  Compliance with Program/Exercises: Will assess as treatment progresses.   Reason for Continuation of Services/Other Comments:  ?? Patient continues to require modification of therapeutic interventions to increase complexity of exercises.  Recommendations/Intent for next treatment session: "Treatment next visit will focus on advancements to more challenging activities".    Total Treatment Duration:  PT Patient Time In/Time Out  Time In: 1100  Time Out: 1200    Sandra Harmon, PT

## 2011-06-19 NOTE — Progress Notes (Signed)
[facility]rapy Center at [facility]    [address] [place], [place] [address]  [phone]    [phone]    Outpatient PHYSICAL THERAPY: Daily Note  Fall Risk Score: 1 (? 5 = High Risk)     REFERRING PHYSICIAN: Lennart Pall, MD  MD Orders: eval and treat, "push ROM "   Return Physician Appointment: next week  MEDICAL/REFERRING DIAGNOSIS: s/p L shoulder scope ASD ADCR  DATE OF ONSET: 05/31/11   PRIOR LEVEL OF FUNCTION: independent   PRECAUTIONS/ALLERGIES:   Allergies   Allergen Reactions   ??? Lipitor (Atorvastatin) Other (comments)     Muscle pain   ??? Lortab (Hydrocodone-Acetaminophen) Itching   ??? Penicillins Hives       ASSESSMENT:  ????????This section established at most recent assessment??????????  Carmelle Bamberg is demonstrating significant functional limitation currently s/p above surgery on left shoulder.  Pt will highly benefit from skilled PT to reach maximum potential addressing below problems.      PROBLEM LIST (Impairments causing functional limitations):  1. Decreased Strength affecting function  2. Decreased ADL/Functional Activities  3. Decreased Flexibility/joint mobility  4. Increased Pain affecting function  5. Decreased Activity tolerance   6. Other decreased knowlege of HEP  GOALS: (Goals have been discussed and agreed upon with patient.)  SHORT-TERM FUNCTIONAL GOALS: Time Frame: 2-4 weeks   1. Pt will be independent with HEP focusing on ROM.  2. Pt will increase left shoulder PROM by at least 20 degrees in all planes.  3. Pt will report not needing assistance for dressing or bathing.  4. Pt will report shoulder pain is no longer constant.   5. Pt will be sleeping in regular bed with minimal to no interruptions due to shoulder pain.   DISCHARGE GOALS: Time Frame: 4-6 weeks   1. Pt will improve DASH score by at least 30%.  2. Pt will return to full work duty without limitation due to left UE.  3. Pt will demonstrate 4+/5 strength of left shoulder in all planes.   4. Pt will  report pain of left shoulder does not exceed a 2/10 in 1-2 week span of time.  REHABILITATION POTENTIAL FOR STATED GOALS: GoodPLAN OF CARE:  INTERVENTIONS PLANNED: (Benefits and precautions of physical therapy have been discussed with the patient.)  1. cold  2. electrical stimulation  3. heat  4. home exercise program (HEP)  5. manual therapy  6. range of motion: active/assisted/passive  7. therapeutic activities  8. therapeutic exercise/strengthening  TREATMENT PLAN EFFECTIVE DATES: 06/03/11 TO 07/15/11  FREQUENCY/DURATION: Follow patient 2-3 times a week for 6 weeks to address above goals.  Regarding Jessikah Dicker Billig's therapy, I certify that the treatment plan above will be carried out by a therapist or under their direction.  Thank you for this referral,  Wilber Oliphant, PT     Referring Physician Signature:          Date                       SUBJECTIVE:    History of Present Injury/Illness (Reason for Referral):   05/31/11 surgery   Pt states that pain of left arm/shoulder began insidiously in September 2012.  Shoulder pain persisted and worsened for 6 months before pt saw MD for this. Above surgery 05/31/11. Pt reports significant pain and limitation since surgery.   Present Symptoms:  0/10 pain currently.  Only pain of left UT region with activities. No sling. Doing  most activities with left UE.                  Dominant Side: left  Past Medical History: hysterectomy, cervical fusion (2007)  Current Medications: antiinflammatory, oxycodone, synthroid    Date Last Reviewed: 06/19/2011     Social History/Home Situation:   lives with husband (husband assisting patient with most ADLs at this time.      Work/Activity History:   works full time at daycare, mostly with infants     OBJECTIVE:    Outcome Measure:   Tool Used: Disabilities of the Arm, Shoulder and Hand (DASH) Questionnaire - Quick Version  Score:  Initial: 41/55  Most Recent: X/55 (Date: -- )   Interpretation of Score: The DASH is designed to measure the  activities of daily living in person's with upper extremity dysfunction or pain.  Each section is scored on a 1-5 scale, 5 representing the greatest disability.  The scores of each section are added together for a total score of 55.    Score 0 1-11 12-22 23-33 34-44 45-54 55   Modifier CH CI CJ CK CL CM CN       Carrying, Moving, and Handling Objects:    Z6109 - CURRENT STATUS: CK - 40%-59% impaired, limited or restricted   U0454 - GOAL STATUS:  CI - 1%-19% impaired, limited or restricted   U9811 - D/C STATUS:  ---------------To be determined---------------      Observation/Orthostatic Postural Assessment:     ?? Pt demonstates bilateral rounded shoulders and poor overall posture.  ?? Left arm in sling  Palpation:     Tenderness of left shoulder  ROM: left limited by pain                          Strength:    left not tested at this time              Special Tests: not tested   Neurological Screen:   no symptoms of nuero involvement at this time  Functional Mobility:      unable to use left UE for most functional tasks at this time due to pain and decreased ROM.  Balance:     Good.  TREATMENT:    (In addition to Assessment/Re-Assessment sessions the following treatments were rendered)  Education: free weight exercise .   Therapeutic Exercise: (30 Minutes):  Exercises per grid below to improve mobility and strength.  Required moderate visual, verbal and manual cues to promote proper body mechanics and promote proper body breathing techniques.  Progressed range, repetitions and complexity of movement as indicated.   Date:  06/11/11 Date:  06/19/11 Date:     Activity/Exercise Parameters Parameters Parameters   Wand flexion  X 5     pulleys X 20 flex and abd  X 20 flex and abd     UBE 2/2 level 1 3/3 level 2    Wand ER X 10      iso ER and IR with manual resist  ER and IR x 10    Free weights   Flex, abd, scap 1# 2 x 10     Bicep curls  3# 2 x 10                   Manual Therapy (   15 min   ): to improve mobility  ?? PROM  all planes left GH  ?? Grade II and  III- quadrant     Therapeutic Modalities:                        Left Shoulder Cold  Duration : 10 minutes                                                                      HEP: As above; handouts given to patient for all exercises.  ______________________________________________________________________________________________________    Treatment Assessment:   improved ROM at end of session. No soreness present    Progression/Medical Necessity:   ?? Patient is expected to demonstrate progress in strength and range of motion to decrease assistance required with all ADLS.Marland Kitchen  Compliance with Program/Exercises: Will assess as treatment progresses.   Reason for Continuation of Services/Other Comments:  ?? Patient continues to require modification of therapeutic interventions to increase complexity of exercises.  Recommendations/Intent for next treatment session: "Treatment next visit will focus on advancements to more challenging activities".    Total Treatment Duration:  PT Patient Time In/Time Out  Time In: 1300  Time Out: 1400    Kanai Berrios A Ilianna Bown, PT

## 2011-06-25 NOTE — Progress Notes (Signed)
[facility] at [facility]  [address] [place], [place] [address]  Phone:[phone]    Fax:[phone]    OUTPATIENT DAILY NOTE    NAME/AGE/GENDER: Sandra Harmon is a 56 y.o. female.     DATE: 06/25/2011    SUBJECTIVE:  Patient cancelled for appointment today due to illness.  Will plan to follow up on next scheduled visit.    [name], PT, COMT

## 2015-07-03 ENCOUNTER — Inpatient Hospital Stay: Admit: 2015-07-03 | Payer: BLUE CROSS/BLUE SHIELD | Attending: Internal Medicine | Primary: Nurse Practitioner

## 2015-07-03 ENCOUNTER — Encounter

## 2015-07-03 DIAGNOSIS — M1990 Unspecified osteoarthritis, unspecified site: Secondary | ICD-10-CM

## 2015-08-28 ENCOUNTER — Ambulatory Visit
Admit: 2015-08-28 | Discharge: 2015-08-28 | Payer: BLUE CROSS/BLUE SHIELD | Attending: Neurology | Primary: Nurse Practitioner

## 2015-08-28 DIAGNOSIS — R4182 Altered mental status, unspecified: Secondary | ICD-10-CM

## 2015-08-28 NOTE — Progress Notes (Signed)
08/28/2015  Sandra Harmon     Patient is referred by the following provider for consultation regarding as below:     Dear  Crissie Sickles, MD  [address]  [place], [place] [address]    I reviewed the available records and notes and have examined patient with the following findings:     Chief Complaint:  Chief Complaint   Patient presents with   ??? New Patient     Syncope      Mixed vertebrobasilar features with vasovagal syncope and diplopia - mixed.      HPI: This is a   right handed 60 y.o. Separated    Caucasian female episode near syncope    couldn't drive further.      Witnessed pallor and such = more than one episode.  No incontinence.    No tongue bite.      IMAGING REVIEW:  I REVIEWED PERTINENT  IMAGES AND REPORTS WITH THE PATIENT PERSONALLY, DIRECTLY AND FULLY.  9 MINUTES.     Past Medical History:  Past Medical History:   Diagnosis Date   ??? Arthritis    ??? Thyroid disease     hypothyroid       Past Surgical History:  Past Surgical History:   Procedure Laterality Date   ??? HX APPENDECTOMY     ??? HX CERVICAL DISKECTOMY  09/16/06    Anterior C5-C6 diskectomy with decompression;  anterior cervical arthrodesis interbody technique;  anterior cervical instrumentation;  tricortical iliac crest bone graft harvested through a separate incision.    ??? HX CHOLECYSTECTOMY     ??? HX TAH AND BSO         Social History:  Social History     Social History   ??? Marital status: LEGALLY SEPARATED     Spouse name: N/A   ??? Number of children: N/A   ??? Years of education: N/A     Occupational History   ??? Not on file.     Social History Main Topics   ??? Smoking status: Former Smoker     Packs/day: 1.00     Years: 15.00     Quit date: 10/25/1996   ??? Smokeless tobacco: Never Used   ??? Alcohol use No   ??? Drug use: No   ??? Sexual activity: Not on file     Other Topics Concern   ??? Not on file     Social History Narrative       Family History:   Family History   Problem Relation Age of Onset   ??? Lung Disease Mother    ??? Asthma Mother     ??? Liver Disease Father    ??? Stroke Brother        Current Outpatient Prescriptions on File Prior to Visit   Medication Sig Dispense Refill   ??? levothyroxine (SYNTHROID) 75 mcg tablet Take 75 mcg by mouth Daily (before breakfast). Take / use AM day of surgery with a sip of water per anesthesia protocols.     ??? cyclobenzaprine (FLEXERIL) 10 mg tablet Take 10 mg by mouth three (3) times daily as needed.       ??? traMADol (ULTRAM) 50 mg tablet Take 50 mg by mouth every eight (8) hours as needed. Take / use AM day of surgery with a sip of water per anesthesia protocols.     ??? sulfacetamide (BLEPH-10) 10 % ophthalmic solution Administer 2 Drops to both eyes four (4) times daily.     ???  diclofenac EC (VOLTAREN) 75 mg EC tablet Take 75 mg by mouth two (2) times a day.     ??? diclofenac (VOLTAREN) 1 % topical gel Apply 2 g to affected area four (4) times daily.       No current facility-administered medications on file prior to visit.        Allergies   Allergen Reactions   ??? Lipitor [Atorvastatin] Other (comments)     Muscle pain   ??? Lortab [Hydrocodone-Acetaminophen] Itching   ??? Penicillins Hives       Review of Systems:  Review of Systems   Constitutional: Negative.    HENT: Negative.    Eyes: Negative.    Respiratory: Negative.    Cardiovascular: Negative.    Gastrointestinal: Negative.    Genitourinary: Negative.    Musculoskeletal: Positive for joint pain, myalgias and neck pain.   Skin: Negative.    Neurological: Positive for dizziness, tingling, sensory change and loss of consciousness. Negative for tremors, speech change, focal weakness and seizures.   Endo/Heme/Allergies: Negative.    Psychiatric/Behavioral: Negative.    All other systems reviewed and are negative.    No flowsheet data found.  No flowsheet data found.     Extended / Orthostatic Vitals:    Vital Signs  Pulse (Heart Rate): 97 (08/28/15 0959)  BP: 132/87 (08/28/15 0959)         Oxygen Therapy  O2 Sat (%): 99 % (08/28/15 0854)    Vitals:     08/28/15 0854 08/28/15 0958 08/28/15 0959   BP: 130/71 130/84 132/87   Pulse: 94 91 97   SpO2: 99%     Weight: 155 lb (70.3 kg)     Height: 5' (1.524 m)          Physical Exam   Constitutional: She is oriented to person, place, and time and well-developed, well-nourished, and in no distress. Vital signs are normal. She appears not lethargic, to not be writhing in pain, not malnourished, not dehydrated and not jaundiced. She appears healthy. She appears not cachectic.  Non-toxic appearance. She does not have a sickly appearance. No distress.   HENT:   Head: Normocephalic and atraumatic. Not macrocephalic and not microcephalic. Head is without raccoon's eyes, without Battle's sign, without abrasion, without contusion, without laceration, without right periorbital erythema and without left periorbital erythema. Hair is normal.   Eyes: Conjunctivae and EOM are normal. Pupils are equal, round, and reactive to light. Right eye exhibits no discharge. Left eye exhibits no discharge. No scleral icterus.   Neck: Normal range of motion, full passive range of motion without pain and phonation normal. Neck supple. No JVD present. Carotid bruit is not present. No rigidity. Normal range of motion present.   Cardiovascular: Normal rate, regular rhythm, normal heart sounds and intact distal pulses.    No murmur heard.  Pulmonary/Chest: Effort normal and breath sounds normal. No stridor. No respiratory distress. She has no wheezes. She has no rales.   Musculoskeletal: Normal range of motion. She exhibits no edema, tenderness or deformity.   Lymphadenopathy:     She has no cervical adenopathy.   Neurological: She is oriented to person, place, and time. She has normal strength and normal reflexes. She appears not lethargic. She displays normal reflexes. No cranial nerve deficit. She exhibits normal muscle tone. She has a normal Finger-Nose-Finger Test, a normal Romberg Test and  a normal Tandem Gait Test. Gait normal. Coordination normal.   Reflex Scores:  Tricep reflexes are 2+ on the right side and 2+ on the left side.       Bicep reflexes are 2+ on the right side and 2+ on the left side.       Brachioradialis reflexes are 2+ on the right side and 2+ on the left side.       Patellar reflexes are 2+ on the right side and 2+ on the left side.       Achilles reflexes are 2+ on the right side and 2+ on the left side.  Skin: Skin is warm and dry. No bruising and no rash noted. She is not diaphoretic. No cyanosis or erythema. No pallor. Nails show no clubbing.   Psychiatric: Her speech is normal. Affect normal.   Nursing note and vitals reviewed.      Neurologic Exam     Mental Status   Oriented to person, place, and time.   Attention: normal. Concentration: normal.   Speech: speech is normal   Level of consciousness: alert  Knowledge: good and consistent with education. Able to perform simple calculations.   Able to write. Normal comprehension.     Cranial Nerves   Cranial nerves II through XII intact.     CN III, IV, VI   Pupils are equal, round, and reactive to light.  Extraocular motions are normal.                        Normal fundi bilaterally.                 PERRLA.      VF wnl to finger counting.                  No INO.   No Horner.              No blepharospasm.          Motor Exam   Muscle bulk: normal  Overall muscle tone: normal  Right arm pronator drift: absent  Left arm pronator drift: absent    Strength   Strength 5/5 throughout.        Motor = no abnormal movements.            No fasciculations or fibrillations (tongue).         No myotonia or myokymia.           No tardive dyskinesia.        Sensory Exam   Light touch normal.   Vibration normal.   Proprioception normal.   Pinprick normal.   Graphesthesia: normal    Gait, Coordination, and Reflexes     Gait  Gait: normal    Coordination   Romberg: negative  Finger to nose coordination: normal   Tandem walking coordination: normal    Tremor   Resting tremor: absent  Intention tremor: absent  Action tremor: absent    Reflexes   Right brachioradialis: 2+  Left brachioradialis: 2+  Right biceps: 2+  Left biceps: 2+  Right triceps: 2+  Left triceps: 2+  Right patellar: 2+  Left patellar: 2+  Right achilles: 2+  Left achilles: 2+  Right grip: 2+  Left grip: 2+  Right Hoffman: absent  Left Hoffman: absent  Right ankle clonus: absent  Left ankle clonus: absent  Right pendular knee jerk: absent  Left pendular knee jerk: absent       Normal arm swing.     There is no tic,  twitch, tonic or clonic activity noted.  Assessment   Assessment / Plan:    Sandra Harmon was seen today for new patient.    Diagnoses and all orders for this visit:    Altered mental status, unspecified    Hx-TIA (transient ischemic attack)    Syncope, vasovagal    Encounter for medication management    Two or more episodes mixed .Marland Kitchen     The Diagnosis and differential diagnostic considerations, and Rx Tx were reviewed with the patient at length.    possible TIA    I have spent greater than 50% of visit discussing and counseling of patient 60 min visit for treatment and diagnostic plan review.       Testing further and followup      Notes: Patient is to continue all medications as directed by prescribing physicians. Continuations on today's visit are made based on the patient's report of current medications.     Current Meds Verified: Current meds/immunizations reviewed, including purpose with pt. Med Recon list given to pt. Pt advised to discard old med lists and provide all providers with current list at each visit and carry list with them in case of emergency.           Cameron Proud MD  Consultation Neurology, Neurodiagnostics and Neurotherapeutics  Neuroelectrophysiology, EEG, EMG  [facility]  [address]  [place], [place] [address]  Phone:  [phone]  Fax:   [phone]

## 2015-08-28 NOTE — Patient Instructions (Addendum)
Orthostatic Hypotension: Care Instructions  Your Care Instructions  Orthostatic hypotension is a quick drop in blood pressure. It happens when you get up from sitting or lying down. You may feel faint, lightheaded, or dizzy.  When a person sits up or stands up, the body changes the way it pumps blood. This can slow the flow of blood to the brain for a very short time. And that can make you feel lightheaded.  Many medicines can cause this problem, especially in older people. Lack of fluids (dehydration) or illnesses such as diabetes or heart disease also can cause it.  Follow-up care is a key part of your treatment and safety. Be sure to make and go to all appointments, and call your doctor if you are having problems. It's also a good idea to know your test results and keep a list of the medicines you take.  How can you care for yourself at home?  ?? Tell your doctor about any problems you have with your medicines.  ?? If your doctor prescribes medicine to help prevent a low blood pressure problem, take it exactly as prescribed. Call your doctor if you think you are having a problem with your medicine.  ?? Drink plenty of fluids, enough so that your urine is light yellow or clear like water. Choose water and other caffeine-free clear liquids. If you have kidney, heart, or liver disease and have to limit fluids, talk with your doctor before you increase the amount of fluids you drink.  ?? Limit or avoid alcohol and caffeine.  ?? Get up slowly from bed or after sitting for a long time. If you are in bed, roll to your side and swing your legs over the edge of the bed and onto the floor. Push your body up to a sitting position. Wait for a while before you slowly stand up. If you are dizzy or lightheaded, sit or lie down.  When should you call for help?  Call 911 anytime you think you may need emergency care. For example, call if:  ?? You passed out (lost consciousness).   Watch closely for changes in your health, and be sure to contact your doctor if:  ?? You do not get better as expected.  Where can you learn more?  Go to [employer]Stats[employer]cahttp://www.healthwise.net/GoodHelpConnections.  Enter (317) 377-0947V984 in the search box to learn more about "Orthostatic Hypotension: Care Instructions."  Current as of: May 29, 2015  Content Version: 11.3  ?? 2006-2017 [employer]. Care instructions adapted under license by [employer] (which disclaims liability or warranty for this information). If you have questions about a medical condition or this instruction, always ask your healthcare professional. [employer]wise, Inc[employer] disclaims any warranty or liability for your use of this information.       Altered Mental Status: Care Instructions  Your Care Instructions  Altered mental status is a change in how well your brain is working. As a result, you may be confused, be less alert than usual, or act in odd ways. This may include seeing or hearing things that aren't really there (hallucinations).  A mental status change has many possible causes. For example, it may be the result of an infection, an imbalance of chemicals in the body, or a chronic disease such as diabetes or COPD. It can also be caused by things such as a head injury, taking certain medicines, or using alcohol or drugs.  The doctor may do tests to look for the cause. These tests may include  urine tests, blood tests, and imaging tests such as a CT scan. Sometimes a clear cause isn't found. But tests can help the doctor rule out a serious cause of your symptoms.  A change in mental status can be scary. But mental status will often return to normal when the cause is treated. So it is important to get any follow-up testing or treatment the doctor has suggested.  The doctor has checked you carefully, but problems can develop later. If you notice any problems or new symptoms, get medical treatment right away.   Follow-up care is a key part of your treatment and safety. Be sure to make and go to all appointments, and call your doctor if you are having problems. It's also a good idea to know your test results and keep a list of the medicines you take.  How can you care for yourself at home?  ?? Be safe with medicines. Take your medicines exactly as prescribed. Call your doctor if you think you are having a problem with your medicine.  ?? Have another adult stay with you until you are better. This can help keep you safe. Ask that person to watch for signs that your mental status is getting worse.  When should you call for help?  Call 911 anytime you think you may need emergency care. For example, call if:  ?? You passed out (lost consciousness).  Call your doctor now or seek immediate medical care if:  ?? Your mental status is getting worse.  ?? You have new symptoms, such as a fever, chills, or shortness of breath.  ?? You do not feel safe.  Watch closely for changes in your health, and be sure to contact your doctor if:  ?? You do not get better as expected.  Where can you learn more?  Go to InsuranceStats.cahttp://www.healthwise.net/GoodHelpConnections.  Enter 601-384-2305J452 in the search box to learn more about "Altered Mental Status: Care Instructions."  Current as of: December 09, 2014  Content Version: 11.3  ?? 2006-2017 [employer]. Care instructions adapted under license by [employer] (which disclaims liability or warranty for this information). If you have questions about a medical condition or this instruction, always ask your healthcare professional. [employer]wise, Incorporated disclaims any warranty or liability for your use of this information.

## 2015-09-25 ENCOUNTER — Institutional Professional Consult (permissible substitution): Admit: 2015-09-25 | Discharge: 2015-09-25 | Payer: BLUE CROSS/BLUE SHIELD | Primary: Nurse Practitioner

## 2015-09-25 ENCOUNTER — Encounter

## 2015-09-25 DIAGNOSIS — R4182 Altered mental status, unspecified: Secondary | ICD-10-CM

## 2015-09-25 NOTE — Progress Notes (Deleted)
DATE: 09/25/2015    EEG Number: [id number]    Indication: TIA/AMS/MEMORY LOSS/SYNCOPE    R41.82    Meds:   Current Outpatient Prescriptions   Medication Sig Dispense Refill   ??? lisinopril (PRINIVIL, ZESTRIL) 20 mg tablet Take 20 mg by mouth daily.     ??? levothyroxine (SYNTHROID) 75 mcg tablet Take 75 mcg by mouth Daily (before breakfast). Take / use AM day of surgery with a sip of water per anesthesia protocols.     ??? traMADol (ULTRAM) 50 mg tablet Take 50 mg by mouth every eight (8) hours as needed. Take / use AM day of surgery with a sip of water per anesthesia protocols.     ??? sulfacetamide (BLEPH-10) 10 % ophthalmic solution Administer 2 Drops to both eyes four (4) times daily.     ??? diclofenac EC (VOLTAREN) 75 mg EC tablet Take 75 mg by mouth two (2) times a day.     ??? diclofenac (VOLTAREN) 1 % topical gel Apply 2 g to affected area four (4) times daily.     ??? cyclobenzaprine (FLEXERIL) 10 mg tablet Take 10 mg by mouth three (3) times daily as needed.           Technique: This EEG was performed using the [employer] 10/20 System.  An EKG was monitored. The length of the recording was 30 minutes.    State of Consciousness: awake and asleep       Description:     10 / 20 IEP 21 channel Xltek equipment bipolar / referential recordings for 30 + minutes using standard montages and technique.   Background:  Normal.   Rhythms:  Normal.   Epileptiform:  None.   Symmetry :  Normal.     Alpha:  8 hz = normal amount.  Normal attenuation with eye opening.     Beta:   13 + Hz and good amplitudes :  Normal amount.  Normal symmetry.    Theta:   5-7 Hz:  Normal amounts.     Delta:   2 - 3 hz:   Normal amounts.    No bizarre or unusual rhythms.                   EKG::  NSR.  Activation Procedures:  Hyperventlation: FAIR EFFORT  Normal HV.   Photic Stimulation: DONE =  Normal driving.      Interpretation: Normal electroencephalogram, awake, asleep and with  activation procedures. There are no focal lateralizing or epileptiform features.          Cameron Proud MD  Consultative Neurology, Neurodiagnostics and Neurotherapeutics  Neuroelectrophysiology, EEG, EMG  [facility]  [address]  [place], [place] [address]  Phone:  [phone]  Fax:   [phone]

## 2015-09-25 NOTE — Progress Notes (Signed)
DATE: 09/25/2015  ??  EEG Number: [id number]  ??  Indication: TIA/AMS/MEMORY LOSS/SYNCOPE    R41.82  ??  Meds:          Current Outpatient Prescriptions   Medication Sig Dispense Refill   ??? lisinopril (PRINIVIL, ZESTRIL) 20 mg tablet Take 20 mg by mouth daily. ?? ??   ??? levothyroxine (SYNTHROID) 75 mcg tablet Take 75 mcg by mouth Daily (before breakfast). Take / use AM day of surgery with a sip of water per anesthesia protocols. ?? ??   ??? traMADol (ULTRAM) 50 mg tablet Take 50 mg by mouth every eight (8) hours as needed. Take / use AM day of surgery with a sip of water per anesthesia protocols. ?? ??   ??? sulfacetamide (BLEPH-10) 10 % ophthalmic solution Administer 2 Drops to both eyes four (4) times daily. ?? ??   ??? diclofenac EC (VOLTAREN) 75 mg EC tablet Take 75 mg by mouth two (2) times a day. ?? ??   ??? diclofenac (VOLTAREN) 1 % topical gel Apply 2 g to affected area four (4) times daily. ?? ??   ??? cyclobenzaprine (FLEXERIL) 10 mg tablet Take 10 mg by mouth three (3) times daily as needed.   ?? ??   ??  ??  Technique: This EEG was performed using the [employer] 10/20 System.  An EKG was monitored. The length of the recording was 30 minutes.  ??  State of Consciousness: awake and asleep   ??  ??  Description:     10 / 20 IEP 21 channel Xltek equipment bipolar / referential recordings for 30 + minutes using standard montages and technique.   Background:  Normal.   Rhythms:  Normal.   Epileptiform:  None.   Symmetry :  Normal.     Alpha:  8 hz = normal amount.  Normal attenuation with eye opening.     Beta:   13 + Hz and good amplitudes :  Normal amount.  Normal symmetry.    Theta:   5-7 Hz:  Normal amounts.     Delta:   2 - 3 hz:   Normal amounts.    No bizarre or unusual rhythms.    ????          EKG::  NSR.  Activation Procedures:  Hyperventlation: FAIR EFFORT  Normal HV.   Photic Stimulation: DONE =  Normal driving.      Interpretation: Normal electroencephalogram, awake, asleep and with  activation procedures. There are no focal lateralizing or epileptiform features.  ??  ??  ??  ??  Cameron Proud MD  Consultative Neurology, Neurodiagnostics and Neurotherapeutics  Neuroelectrophysiology, EEG, EMG  [facility]  [address]  [place], [place] [address]  Phone:  [phone]  Fax:   [phone]  ??   ??         Deleted by: Cameron Proud, MD????????[09/27/2015 1006]

## 2015-09-25 NOTE — Progress Notes (Deleted)
DATE: 09/25/2015    EEG Number: [id number]    Indication: AMS/TIA/MEMORY LOSS    Meds:   Current Outpatient Prescriptions   Medication Sig Dispense Refill   ??? lisinopril (PRINIVIL, ZESTRIL) 20 mg tablet Take 20 mg by mouth daily.     ??? levothyroxine (SYNTHROID) 75 mcg tablet Take 75 mcg by mouth Daily (before breakfast). Take / use AM day of surgery with a sip of water per anesthesia protocols.     ??? traMADol (ULTRAM) 50 mg tablet Take 50 mg by mouth every eight (8) hours as needed. Take / use AM day of surgery with a sip of water per anesthesia protocols.     ??? sulfacetamide (BLEPH-10) 10 % ophthalmic solution Administer 2 Drops to both eyes four (4) times daily.     ??? diclofenac EC (VOLTAREN) 75 mg EC tablet Take 75 mg by mouth two (2) times a day.     ??? diclofenac (VOLTAREN) 1 % topical gel Apply 2 g to affected area four (4) times daily.     ??? cyclobenzaprine (FLEXERIL) 10 mg tablet Take 10 mg by mouth three (3) times daily as needed.           Technique: This EEG was performed using the [employer] 10/20 System.  An EKG was monitored. The length of the recording was 30 minutes.    State of Consciousness: {PROCEDURE - EEG STATE:18081::"awake"}       Description:  ***       Activation Procedures:  Hyperventlation: FAIR EFFORT  Photic Stimulation: DONE    Interpretation: {eeg findings:18089}    Cameron Proud, MD      .signed.electronic sig

## 2015-10-02 ENCOUNTER — Inpatient Hospital Stay: Admit: 2015-10-02 | Payer: BLUE CROSS/BLUE SHIELD | Attending: Orthopaedic Surgery | Primary: Nurse Practitioner

## 2015-10-02 DIAGNOSIS — T84498A Other mechanical complication of other internal orthopedic devices, implants and grafts, initial encounter: Secondary | ICD-10-CM

## 2015-10-02 MED ORDER — PROMETHAZINE 25 MG TAB
25 mg | Freq: Once | ORAL | Status: DC | PRN
Start: 2015-10-02 — End: 2015-10-06

## 2015-10-02 MED ORDER — TRAMADOL 50 MG TAB
50 mg | Freq: Once | ORAL | Status: AC
Start: 2015-10-02 — End: 2015-10-02
  Administered 2015-10-02: 16:00:00 via ORAL

## 2015-10-02 MED ORDER — SODIUM BICARBONATE 4 % IV
4 % | INTRAVENOUS | Status: AC
Start: 2015-10-02 — End: ?

## 2015-10-02 MED ORDER — LIDOCAINE HCL 2 % (20 MG/ML) IJ SOLN
20 mg/mL (2 %) | Freq: Once | INTRAMUSCULAR | Status: AC
Start: 2015-10-02 — End: 2015-10-02
  Administered 2015-10-02: 15:00:00 via SUBCUTANEOUS

## 2015-10-02 MED ORDER — TRAMADOL 50 MG TAB
50 mg | ORAL | Status: AC
Start: 2015-10-02 — End: ?

## 2015-10-02 MED ORDER — IOPAMIDOL 61 % INTRATHECAL
300 mg iodine /mL (61 %) | Freq: Once | INTRATHECAL | Status: AC
Start: 2015-10-02 — End: 2015-10-02
  Administered 2015-10-02: 15:00:00 via INTRASPINAL

## 2015-10-02 MED ORDER — LIDOCAINE HCL 2 % (20 MG/ML) IJ SOLN
20 mg/mL (2 %) | INTRAMUSCULAR | Status: AC
Start: 2015-10-02 — End: ?

## 2015-10-02 MED ORDER — SODIUM BICARBONATE 4 % IV
4 % | Freq: Once | INTRAVENOUS | Status: AC
Start: 2015-10-02 — End: 2015-10-02
  Administered 2015-10-02: 15:00:00 via SUBCUTANEOUS

## 2015-10-02 MED FILL — TRAMADOL 50 MG TAB: 50 mg | ORAL | Qty: 1

## 2015-10-02 MED FILL — NEUT 4 % INTRAVENOUS SOLUTION: 4 % | INTRAVENOUS | Qty: 5

## 2015-10-02 MED FILL — XYLOCAINE 20 MG/ML (2 %) INJECTION SOLUTION: 20 mg/mL (2 %) | INTRAMUSCULAR | Qty: 20

## 2015-10-02 NOTE — Procedures (Signed)
Procedures by Arman Bogusarruthers, Fernando Torry L, PA-C at 10/02/15 1131                Author: Arman Bogusarruthers, Tammala Weider L, PA-C  Service: Physician Assistant  Author Type: Physician Assistant       Filed: 10/02/15 1132  Date of Service: 10/02/15 1131  Status: Signed           Editor: Drema Dallasarruthers, Sharnise Blough L, PA-C (Physician Assistant)  Cosigner: Antonieta LovelessBaghdady, Brian H, MD at 10/02/15 1622                    Department of Interventional Radiology   [phone]          Interventional Radiology Brief Procedure Note          Patient: Sandra Harmon  MRN: [id number]   SSN: [id number]          Date of Birth: 08-22-1955   Age: 60 y.o.   Sex: female         Date of Procedure: 10/02/2015      Pre-Procedure Diagnosis: upper extremity pain      Post-Procedure Diagnosis: SAME      Procedure(s): Myelogram      Brief Description of Procedure: fluoro guided LP @ L3/4, contrast  injected, needle removed      Performed By: Arman BogusKristy L Sharlena Kristensen, PA-C       Assistants: None      Anesthesia:None      Estimated Blood Loss: None      Specimens: None      Implants: None      Findings: See dictated report.        Complications: None      Recommendations: bedrest, CT        Follow Up: referring MD         Signed By:  Arman BogusKristy L Darlene Bartelt, PA-C           October 02, 2015

## 2015-10-02 NOTE — Telephone Encounter (Signed)
Pt notified that EEG results are Normal

## 2015-10-02 NOTE — Progress Notes (Signed)
Patient in IR for procedure.

## 2015-10-02 NOTE — Progress Notes (Signed)
IR prep completed.

## 2015-10-02 NOTE — Procedures (Signed)
Department of Interventional Radiology  [phone]        Interventional Radiology Brief Procedure Note    Patient: Sandra Harmon MRN: [id number]  SSN: [id number]    Date of Birth: [birth year]  Age: 60 y.o.  Sex: female      Date of Procedure: 10/02/2015    Pre-Procedure Diagnosis: upper extremity pain    Post-Procedure Diagnosis: SAME    Procedure(s): Myelogram    Brief Description of Procedure: fluoro guided LP @ L3/4, contrast injected, needle removed    Performed By: Sandra BogusKristy L Luisalberto Beegle, PA-C     Assistants: None    Anesthesia:None    Estimated Blood Loss: None    Specimens: None    Implants: None    Findings: See dictated report.      Complications: None    Recommendations: bedrest, CT     Follow Up: referring MD    Signed By: Sandra BogusKristy L Miklos Bidinger, PA-C     October 02, 2015

## 2015-10-18 ENCOUNTER — Ambulatory Visit
Admit: 2015-10-18 | Discharge: 2015-10-18 | Payer: BLUE CROSS/BLUE SHIELD | Attending: Neurology | Primary: Nurse Practitioner

## 2015-10-18 DIAGNOSIS — R55 Syncope and collapse: Secondary | ICD-10-CM

## 2015-10-18 NOTE — Progress Notes (Signed)
10/18/2015  Sandra Harmon is a 60 y.o. female here for ::        Referred by Crissie SicklesMohammad Rashid, MD  [address]  [place], [place] [id number]    Chief Complaint:   Chief Complaint   Patient presents with   ??? Follow-up     Sycope      unaccompanied     HPI     Syncope  No seizure  Lightheadedness spells.   No incontinence.    No seizure.     No injuries.  Postural dizziness and such is frequent.     EEG is good.    Normal.         Past Medical History:   Diagnosis Date   ??? Arthritis    ??? Thyroid disease     hypothyroid     Past Surgical History:   Procedure Laterality Date   ??? HX APPENDECTOMY     ??? HX CERVICAL DISKECTOMY  09/16/06    Anterior C5-C6 diskectomy with decompression;  anterior cervical arthrodesis interbody technique;  anterior cervical instrumentation;  tricortical iliac crest bone graft harvested through a separate incision.    ??? HX CHOLECYSTECTOMY     ??? HX TAH AND BSO        Family History   Problem Relation Age of Onset   ??? Lung Disease Mother    ??? Asthma Mother    ??? Liver Disease Father    ??? Stroke Brother      Social History   Substance Use Topics   ??? Smoking status: Former Smoker     Packs/day: 1.00     Years: 15.00     Quit date: 10/25/1996   ??? Smokeless tobacco: Never Used   ??? Alcohol use No      Prior to Admission medications    Medication Sig Start Date End Date Taking? Authorizing Provider   lisinopril (PRINIVIL, ZESTRIL) 20 mg tablet Take 10 mg by mouth daily.   Yes Historical Provider   levothyroxine (SYNTHROID) 75 mcg tablet Take 75 mcg by mouth Daily (before breakfast). Take / use AM day of surgery with a sip of water per anesthesia protocols.   Yes Historical Provider   cyclobenzaprine (FLEXERIL) 10 mg tablet Take 10 mg by mouth nightly.   Yes Phys Other, MD        Allergies   Allergen Reactions   ??? Lipitor [Atorvastatin] Other (comments)     Muscle pain   ??? Lortab [Hydrocodone-Acetaminophen] Itching   ??? Penicillins Hives         Review of Systems   Constitutional: Negative.     HENT: Negative.    Eyes: Negative.    Respiratory: Negative.    Cardiovascular: Negative.    Gastrointestinal: Negative.    Genitourinary: Negative.    Musculoskeletal: Positive for joint pain (shoulders) and neck pain.   Skin: Negative.    Neurological: Positive for dizziness (lightheaded spells.), tingling (feet - numbenss), loss of consciousness (no recurrent but lightheadedness. postural imbalance) and headaches.   Endo/Heme/Allergies: Negative.    Psychiatric/Behavioral: Positive for memory loss.   All other systems reviewed and are negative.      Altered mental status, unspecified  ??  Hx-TIA (transient ischemic attack)  ??  Syncope, vasovagal  ??  Encounter for medication management  ??  Two or more episodes mixed .Marland Kitchen.     The Diagnosis and differential diagnostic considerations, and Rx Tx were reviewed with the patient at length.  possible TIA  ??    Patient encouraged to take orthostatic BP checks.     I have spent greater than 50% of visit discussing and counseling of patient 60 min visit for treatment and diagnostic plan review.       Testing further and followup    ??  Notes: Patient is to continue all medications as directed by prescribing physicians. Continuations on today's visit are made based on the patient's report of current medications.??  ??  Current Meds Verified: Current meds/immunizations reviewed, including purpose with pt. Med Recon list given to pt. Pt advised to discard old med lists and provide all providers with current list at each visit and carry list with them in case of emergency.         Objective:     Vitals:    10/18/15 1511 10/18/15 1519   BP: 139/78 118/80   Pulse: (!) 103 (!) 107   SpO2: 96%    Weight: 158 lb 8 oz (71.9 kg)    Height: 5' (1.524 m)         Physical Exam   Constitutional: She is oriented to person, place, and time and well-developed, well-nourished, and in no distress. Vital signs are normal. She appears not lethargic, to not be writhing in pain, not  malnourished, not dehydrated and not jaundiced. She appears healthy. She appears not cachectic.  Non-toxic appearance. She does not have a sickly appearance. No distress.   HENT:   Head: Normocephalic and atraumatic. Not macrocephalic and not microcephalic. Head is without raccoon's eyes, without Battle's sign, without abrasion, without contusion and without laceration. Hair is normal.   Eyes: Conjunctivae and EOM are normal. Pupils are equal, round, and reactive to light.   Neck: Normal range of motion and phonation normal. Neck supple. No JVD present. No rigidity.   Cardiovascular: Normal rate.    Pulmonary/Chest: Effort normal. No stridor.   Musculoskeletal: Normal range of motion.   Lymphadenopathy:     She has no cervical adenopathy.   Neurological: She is oriented to person, place, and time. She has normal strength and intact cranial nerves. She appears not lethargic. She is not agitated and not disoriented. She displays no weakness, no atrophy, no tremor, facial symmetry, normal stance and normal speech. No cranial nerve deficit or sensory deficit. She exhibits normal muscle tone. She has a normal Cerebellar Exam and a normal Tandem Gait Test. Gait normal. Coordination and gait normal. GCS score is 15.   Skin: Skin is warm and dry. No rash noted. She is not diaphoretic. No erythema. No pallor.   Psychiatric: Affect and judgment normal.   Nursing note and vitals reviewed.         Assessment & Plan     Diagnoses and all orders for this visit:    1. Syncope, vasovagal    2. Hx-TIA (transient ischemic attack)    3. Lightheadedness    4. Encounter for medication management          All drugs and rx reviewed fully with patient.          Differential diagnostic decisions and thoughts reviewed and discussed.         Treatment options fully reviewed with patient.            Acknowledgements obtained where needed.         Signed By: Cameron ProudBrian M Kate Sweetman, MD     October 18, 2015

## 2015-10-18 NOTE — Patient Instructions (Signed)
Orthostatic Hypotension: Care Instructions  Your Care Instructions  Orthostatic hypotension is a quick drop in blood pressure. It happens when you get up from sitting or lying down. You may feel faint, lightheaded, or dizzy.  When a person sits up or stands up, the body changes the way it pumps blood. This can slow the flow of blood to the brain for a very short time. And that can make you feel lightheaded.  Many medicines can cause this problem, especially in older people. Lack of fluids (dehydration) or illnesses such as diabetes or heart disease also can cause it.  Follow-up care is a key part of your treatment and safety. Be sure to make and go to all appointments, and call your doctor if you are having problems. It's also a good idea to know your test results and keep a list of the medicines you take.  How can you care for yourself at home?  ?? Tell your doctor about any problems you have with your medicines.  ?? If your doctor prescribes medicine to help prevent a low blood pressure problem, take it exactly as prescribed. Call your doctor if you think you are having a problem with your medicine.  ?? Drink plenty of fluids, enough so that your urine is light yellow or clear like water. Choose water and other caffeine-free clear liquids. If you have kidney, heart, or liver disease and have to limit fluids, talk with your doctor before you increase the amount of fluids you drink.  ?? Limit or avoid alcohol and caffeine.  ?? Get up slowly from bed or after sitting for a long time. If you are in bed, roll to your side and swing your legs over the edge of the bed and onto the floor. Push your body up to a sitting position. Wait for a while before you slowly stand up. If you are dizzy or lightheaded, sit or lie down.  When should you call for help?  Call 911 anytime you think you may need emergency care. For example, call if:  ?? You passed out (lost consciousness).   Watch closely for changes in your health, and be sure to contact your doctor if:  ?? You do not get better as expected.  Where can you learn more?  Go to http://www.healthwise.net/[employer]Help[employer]ctions.  Enter V984 in the search box to learn more about "Orthostatic Hypotension: Care Instructions."  Current as of: May 29, 2015  Content Version: 11.3  ?? 2006-2017 Healthwise[employer] Inc[employer]. Care instructions adapted under license by [employer] (which disclaims liability or warranty for this information). If you have questions about a medical condition or this instruction, always ask your healthcare professional. Healthwise, Incorporated disclaims any warranty or liability for your use of this information.

## 2016-04-16 LAB — LIPID PANEL
CHOLESTEROL: 178 (ref 0–200)
HDL: 57 (ref 35–70)
LDL CALC: 95
Triglycerides: 132 (ref 40–160)

## 2016-06-27 ENCOUNTER — Inpatient Hospital Stay: Admit: 2016-06-27 | Payer: BLUE CROSS/BLUE SHIELD | Primary: Nurse Practitioner

## 2016-06-27 ENCOUNTER — Encounter

## 2016-06-27 DIAGNOSIS — R52 Pain, unspecified: Secondary | ICD-10-CM

## 2016-09-17 LAB — BASIC METABOLIC PANEL
BUN: 13 (ref 4–21)
Creatinine: 0.7 (ref 0.5–1.1)
Glucose: 85
POTASSIUM: 4.5 (ref 3.4–5.3)
SODIUM: 141 (ref 137–147)

## 2016-09-17 LAB — CBC AND DIFFERENTIAL
HEMATOCRIT: 44 (ref 36–46)
Hemoglobin: 14.6 (ref 12.0–16.0)
PLATELETS: 236 (ref 150–399)
WBC: 5.1

## 2016-09-17 LAB — HEPATIC FUNCTION PANEL
ALK PHOS: 97 (ref 25–125)
ALT: 21 (ref 7–35)
AST: 19 (ref 13–35)
BILIRUBIN, TOTAL: 0.3

## 2016-09-17 LAB — VITAMIN D 25 HYDROXY (VIT D DEFICIENCY, FRACTURES): Vit D, 25-Hydroxy: 25

## 2016-09-17 LAB — TSH: TSH: 0.4 — AB (ref 0.41–5.90)

## 2017-04-03 ENCOUNTER — Other Ambulatory Visit: Payer: Self-pay | Admitting: Family Medicine

## 2017-04-03 ENCOUNTER — Ambulatory Visit (INDEPENDENT_AMBULATORY_CARE_PROVIDER_SITE_OTHER): Payer: BLUE CROSS/BLUE SHIELD

## 2017-04-03 ENCOUNTER — Ambulatory Visit (INDEPENDENT_AMBULATORY_CARE_PROVIDER_SITE_OTHER): Payer: BLUE CROSS/BLUE SHIELD | Admitting: Family Medicine

## 2017-04-03 ENCOUNTER — Encounter: Payer: Self-pay | Admitting: Family Medicine

## 2017-04-03 ENCOUNTER — Encounter (INDEPENDENT_AMBULATORY_CARE_PROVIDER_SITE_OTHER): Payer: Self-pay

## 2017-04-03 VITALS — BP 137/89 | HR 99 | Ht 60.0 in | Wt 170.0 lb

## 2017-04-03 DIAGNOSIS — Z1231 Encounter for screening mammogram for malignant neoplasm of breast: Secondary | ICD-10-CM

## 2017-04-03 DIAGNOSIS — Z8673 Personal history of transient ischemic attack (TIA), and cerebral infarction without residual deficits: Secondary | ICD-10-CM | POA: Diagnosis not present

## 2017-04-03 DIAGNOSIS — R7611 Nonspecific reaction to tuberculin skin test without active tuberculosis: Secondary | ICD-10-CM | POA: Diagnosis not present

## 2017-04-03 DIAGNOSIS — G8929 Other chronic pain: Secondary | ICD-10-CM | POA: Diagnosis not present

## 2017-04-03 DIAGNOSIS — M5136 Other intervertebral disc degeneration, lumbar region: Secondary | ICD-10-CM

## 2017-04-03 DIAGNOSIS — M545 Low back pain, unspecified: Secondary | ICD-10-CM

## 2017-04-03 DIAGNOSIS — Z227 Latent tuberculosis: Secondary | ICD-10-CM

## 2017-04-03 DIAGNOSIS — E039 Hypothyroidism, unspecified: Secondary | ICD-10-CM

## 2017-04-03 DIAGNOSIS — I10 Essential (primary) hypertension: Secondary | ICD-10-CM | POA: Diagnosis not present

## 2017-04-03 DIAGNOSIS — Z1159 Encounter for screening for other viral diseases: Secondary | ICD-10-CM | POA: Diagnosis not present

## 2017-04-03 DIAGNOSIS — I700 Atherosclerosis of aorta: Secondary | ICD-10-CM | POA: Diagnosis not present

## 2017-04-03 DIAGNOSIS — M549 Dorsalgia, unspecified: Secondary | ICD-10-CM

## 2017-04-03 DIAGNOSIS — Z114 Encounter for screening for human immunodeficiency virus [HIV]: Secondary | ICD-10-CM | POA: Diagnosis not present

## 2017-04-03 HISTORY — DX: Personal history of transient ischemic attack (TIA), and cerebral infarction without residual deficits: Z86.73

## 2017-04-03 HISTORY — DX: Other chronic pain: G89.29

## 2017-04-03 HISTORY — DX: Hypothyroidism, unspecified: E03.9

## 2017-04-03 HISTORY — DX: Latent tuberculosis: Z22.7

## 2017-04-03 HISTORY — DX: Essential (primary) hypertension: I10

## 2017-04-03 MED ORDER — LISINOPRIL 10 MG PO TABS: 10.0000 mg | ORAL_TABLET | Freq: Every day | ORAL | 1 refills | Status: DC

## 2017-04-03 MED ORDER — CYCLOBENZAPRINE HCL 10 MG PO TABS: 10.0000 mg | ORAL_TABLET | Freq: Every day | ORAL | 1 refills | Status: DC

## 2017-04-03 NOTE — Patient Instructions (Addendum)
Thank you for coming in today. I will get results of xray and labs to you likely tomorrow.  Schedule MRI.  Recheck after MRI.   Back Pain, Adult Many adults have back pain from time to time. Common causes of back pain include:  A strained muscle or ligament.  Wear and tear (degeneration) of the spinal disks.  Arthritis.  A hit to the back.  Back pain can be short-lived (acute) or last a long time (chronic). A physical exam, lab tests, and imaging studies may be done to find the cause of your pain. Follow these instructions at home: Managing pain and stiffness  Take over-the-counter and prescription medicines only as told by your health care provider.  If directed, apply heat to the affected area as often as told by your health care provider. Use the heat source that your health care provider recommends, such as a moist heat pack or a heating pad. ? Place a towel between your skin and the heat source. ? Leave the heat on for 20-30 minutes. ? Remove the heat if your skin turns bright red. This is especially important if you are unable to feel pain, heat, or cold. You have a greater risk of getting burned.  If directed, apply ice to the injured area: ? Put ice in a plastic bag. ? Place a towel between your skin and the bag. ? Leave the ice on for 20 minutes, 2-3 times a day for the first 2-3 days. Activity  Do not stay in bed. Resting more than 1-2 days can delay your recovery.  Take short walks on even surfaces as soon as you are able. Try to increase the length of time you walk each day.  Do not sit, drive, or stand in one place for more than 30 minutes at a time. Sitting or standing for long periods of time can put stress on your back.  Use proper lifting techniques. When you bend and lift, use positions that put less stress on your back: ? [place] your knees. ? Keep the load close to your body. ? Avoid twisting.  Exercise regularly as told by your health care provider.  Exercising will help your back heal faster. This also helps prevent back injuries by keeping muscles strong and flexible.  Your health care provider may recommend that you see a physical therapist. This person can help you come up with a safe exercise program. Do any exercises as told by your physical therapist. Lifestyle  Maintain a healthy weight. Extra weight puts stress on your back and makes it difficult to have good posture.  Avoid activities or situations that make you feel anxious or stressed. Learn ways to manage anxiety and stress. One way to manage stress is through exercise. Stress and anxiety increase muscle tension and can make back pain worse. General instructions  Sleep on a firm mattress in a comfortable position. Try lying on your side with your knees slightly bent. If you lie on your back, put a pillow under your knees.  Follow your treatment plan as told by your health care provider. This may include: ? Cognitive or behavioral therapy. ? Acupuncture or massage therapy. ? Meditation or yoga. Contact a health care provider if:  You have pain that is not relieved with rest or medicine.  You have increasing pain going down into your legs or buttocks.  Your pain does not improve in 2 weeks.  You have pain at night.  You lose weight.  You have a  fever or chills. Get help right away if:  You develop new bowel or bladder control problems.  You have unusual weakness or numbness in your arms or legs.  You develop nausea or vomiting.  You develop abdominal pain.  You feel faint. Summary  Many adults have back pain from time to time. A physical exam, lab tests, and imaging studies may be done to find the cause of your pain.  Use proper lifting techniques. When you bend and lift, use positions that put less stress on your back.  Take over-the-counter and prescription medicines and apply heat or ice as directed by your health care provider. This information is not  intended to replace advice given to you by your health care provider. Make sure you discuss any questions you have with your health care provider. Document Released: 02/11/2005 Document Revised: 03/18/2016 Document Reviewed: 03/18/2016 Elsevier Interactive Patient Education  [employer]2018 [employer] Inc[employer]

## 2017-04-03 NOTE — Progress Notes (Signed)
Molly MediciDana Solis is a 62 y.o. female who [facility] [place] [facility] today for establish care, discuss back pain and follow up HTN , Hypothyroid.   Back Pain: Molly HarmanDana has a history of low back pain present for several years. She notes the pain radiates to the buttocks but not below the knee. She denies any weakness or numbness. She notes the pain is worse with standing from a seated position and better with rest. She's tried ibuprofen and Flexeril which has helped. She notes that physical therapy is quite expensive but has done home exercise programs and not had much benefit previously. She denies any injury to explain her pain. She does have a history of 2 prior cervical spine fusions but denies any history or significant workup in her low back..  Hypertension: Molly HarmanDana has a history of hypertension that is historically well managed with lisinopril. She denies chest pain palpitations or shortness of breath.  Hypothyroid: Molly HarmanDana takes levothyroxine for hypothyroidism. She's not sure her exact diagnosis but denies any history of thyroidectomy.  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 She remains asymptomatic with no night sweats fevers or chills.  Screening: Molly Harmanana . She is no longer candidate for cervical cancer screening after hysterectomy. She notes that she had a colonoscopy about 5 years ago and is scheduled for mammogram in the near future.   Past Medical History:  Diagnosis Date  . Chronic back pain 04/03/2017  . History of transient ischemic attack (TIA) 04/03/2017  . HTN (hypertension) 04/03/2017  . Hypothyroid 04/03/2017  . 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 ABDOMINAL  HYSTERECTOMY  1998  . APPENDECTOMY  2004  . CHOLECYSTECTOMY  2005  . OOPHORECTOMY Right 1998  . shoulder surgery Left 2010  . SPINE SURGERY     Cspine 2013, 2015   Social History   Tobacco Use  . Smoking status: Former Smoker    Packs/day: 1.00    Years: 10.00    Pack years: 10.00    Types: Cigarettes    Last attempt to quit: 02/26/1996    Years since quitting: 21.1  . Smokeless tobacco: Never Used  Substance Use Topics  . Alcohol use: No    Frequency: Never   family history includes Alcohol abuse in her brother and father; Cancer in her sister; Diabetes in her brother; Heart disease in her mother; Stroke in her brother.  ROS as above: No headache, visual changes, nausea, vomiting, diarrhea, constipation, dizziness, abdominal pain, skin rash, fevers, chills, night sweats, weight loss, swollen lymph nodes, body aches, joint swelling, muscle aches, chest pain, shortness of breath, mood changes, visual or auditory hallucinations.    Medications: Current Outpatient Medications  Medication Sig Dispense Refill  . aspirin 81 MG chewable tablet Chew by mouth daily.    . cyclobenzaprine (FLEXERIL) 10 MG tablet Take 1 tablet (10 mg total) by mouth at bedtime. 90 tablet 1  . levothyroxine (SYNTHROID, LEVOTHROID) 75 MCG tablet Take 75 mcg by mouth daily before breakfast.    . lisinopril (PRINIVIL,ZESTRIL) 10 MG tablet Take 1 tablet (10 mg total) by mouth daily. 90 tablet 1   No current facility-administered medications for this visit.    Allergies  Allergen Reactions  . Penicillins Rash  Health Maintenance Health Maintenance  Topic Date Due  . Hepatitis C Screening  04/30/1955  . HIV Screening  12/24/1971  . MAMMOGRAM  12/24/2006  . COLONOSCOPY  12/24/2006  . INFLUENZA VACCINE  12/04/2017 (Originally 09/25/2016)  . TETANUS/TDAP  09/26/2022     Exam:  BP 137/89   Pulse 99   Ht 5' (1.524 m)   Wt 170 lb (77.1 kg)   BMI 33.20 kg/m  Gen: Well NAD HEENT: EOMI,  MMM Lungs:  Normal work of breathing. CTABL Heart: RRR no MRG Abd: NABS, Soft. Nondistended, Nontender Exts: Brisk capillary refill, warm and well perfused.  Lspine: nontender to midline and throughout Normal back motion pain with extension present. Lower extremitystrength is equal and normal throughout Reflexes are equal and normal throughout bilateral lower extremities Normal slump test bilaterally Normal gait  X-ray lumbar spine shows degenerative changes with no acute appearing injuries or fractures. Awaiting formal radiology review.   Assessment and Plan: 62 y.o. female with  Back pain chronic greater than 6 weeks failing conservative management.Pain is interfering with quality of life. Plan to obtain MRI for injection planning. Continue Flexeril and NSAIDs as needed.  HTN at goal. Plan to check basic labs and refill lisinopril.  Hypothyroid: Plan to check TSH and refill levothyroxine when TSH is known.   History of latent TB: Watchful waiting.    Orders Placed This Encounter  Procedures  . DG Lumbar Spine Complete    Standing Status:   Future    Number of Occurrences:   1    Standing Expiration Date:   06/02/2018    Order Specific Question:   Reason for Exam (SYMPTOM  OR DIAGNOSIS REQUIRED)    Answer:   eval lumbar pain    Order Specific Question:   Is patient pregnant?    Answer:   No    Order Specific Question:   Preferred imaging location?    Answer:   [facility]    Order Specific Question:   Radiology Contrast Protocol - do NOT remove file path    Answer:   \\[email or web address]  . MR Lumbar Spine Wo Contrast    Standing Status:   Future    Standing Expiration Date:   06/02/2018    Order Specific Question:   What is the patient's sedation requirement?    Answer:   No Sedation    Order Specific Question:   Does the patient have a pacemaker or implanted devices?    Answer:   No    Order Specific Question:   Preferred imaging  location?    Answer:   [facility] (table limit-350lbs)    Order Specific Question:   Radiology Contrast Protocol - do NOT remove file path    Answer:   \\[email or web address]  . CBC  . COMPLETE METABOLIC PANEL WITH GFR  . TSH  . Hemoglobin A1c  . HIV antibody  . Hepatitis C antibody   Meds ordered this encounter  Medications  . lisinopril (PRINIVIL,ZESTRIL) 10 MG tablet    Sig: Take 1 tablet (10 mg total) by mouth daily.    Dispense:  90 tablet    Refill:  1  . cyclobenzaprine (FLEXERIL) 10 MG tablet    Sig: Take 1 tablet (10 mg total) by mouth at bedtime.    Dispense:  90 tablet    Refill:  1     Discussed warning signs or symptoms. Please see discharge instructions. Patient expresses understanding.

## 2017-04-04 ENCOUNTER — Other Ambulatory Visit: Payer: Self-pay | Admitting: Family Medicine

## 2017-04-04 ENCOUNTER — Encounter: Payer: Self-pay | Admitting: Family Medicine

## 2017-04-04 DIAGNOSIS — I700 Atherosclerosis of aorta: Secondary | ICD-10-CM | POA: Insufficient documentation

## 2017-04-04 LAB — COMPLETE METABOLIC PANEL WITH GFR
AG Ratio: 1.8 (calc) (ref 1.0–2.5)
ALBUMIN MSPROF: 4.5 g/dL (ref 3.6–5.1)
ALT: 20 U/L (ref 6–29)
AST: 17 U/L (ref 10–35)
Alkaline phosphatase (APISO): 73 U/L (ref 33–130)
BILIRUBIN TOTAL: 0.4 mg/dL (ref 0.2–1.2)
BUN: 11 mg/dL (ref 7–25)
CALCIUM: 9.9 mg/dL (ref 8.6–10.4)
CHLORIDE: 110 mmol/L (ref 98–110)
CO2: 20 mmol/L (ref 20–32)
Creat: 0.73 mg/dL (ref 0.50–0.99)
GFR, Est African American: 104 mL/min/{1.73_m2} (ref >=60)
GFR, Est Non African American: 90 mL/min/{1.73_m2} (ref >=60)
GLUCOSE: 82 mg/dL (ref 65–99)
Globulin: 2.5 g/dL (calc) (ref 1.9–3.7)
POTASSIUM: 3.9 mmol/L (ref 3.5–5.3)
SODIUM: 144 mmol/L (ref 135–146)
TOTAL PROTEIN: 7 g/dL (ref 6.1–8.1)

## 2017-04-04 LAB — HEMOGLOBIN A1C
HEMOGLOBIN A1C: 5 %{Hb} (ref <5.7)
Mean Plasma Glucose: 97 (calc)
eAG (mmol/L): 5.4 (calc)

## 2017-04-04 LAB — HEPATITIS C ANTIBODY
Hepatitis C Ab: NONREACTIVE
SIGNAL TO CUT-OFF: 0.01 (ref <1.00)

## 2017-04-04 LAB — CBC
HEMATOCRIT: 41.6 % (ref 35.0–45.0)
HEMOGLOBIN: 14.4 g/dL (ref 11.7–15.5)
MCH: 30.3 pg (ref 27.0–33.0)
MCHC: 34.6 g/dL (ref 32.0–36.0)
MCV: 87.6 fL (ref 80.0–100.0)
MPV: 10.5 fL (ref 7.5–12.5)
Platelets: 254 10*3/uL (ref 140–400)
RBC: 4.75 10*6/uL (ref 3.80–5.10)
RDW: 12.5 % (ref 11.0–15.0)
WBC: 5.8 10*3/uL (ref 3.8–10.8)

## 2017-04-04 LAB — HIV ANTIBODY (ROUTINE TESTING W REFLEX): HIV 1&2 Ab, 4th Generation: NONREACTIVE

## 2017-04-04 LAB — TSH: TSH: 1.5 m[IU]/L (ref 0.40–4.50)

## 2017-04-04 MED ORDER — LEVOTHYROXINE SODIUM 75 MCG PO TABS: 75.0000 ug | ORAL_TABLET | Freq: Every day | ORAL | 1 refills | Status: DC

## 2017-04-08 ENCOUNTER — Encounter: Payer: Self-pay | Admitting: Family Medicine

## 2017-04-09 ENCOUNTER — Ambulatory Visit (INDEPENDENT_AMBULATORY_CARE_PROVIDER_SITE_OTHER): Payer: BLUE CROSS/BLUE SHIELD

## 2017-04-09 DIAGNOSIS — Z1231 Encounter for screening mammogram for malignant neoplasm of breast: Secondary | ICD-10-CM

## 2017-04-14 ENCOUNTER — Ambulatory Visit (INDEPENDENT_AMBULATORY_CARE_PROVIDER_SITE_OTHER): Payer: BLUE CROSS/BLUE SHIELD

## 2017-04-14 DIAGNOSIS — G8929 Other chronic pain: Secondary | ICD-10-CM

## 2017-04-14 DIAGNOSIS — G9581 Conus medullaris syndrome: Secondary | ICD-10-CM | POA: Diagnosis not present

## 2017-04-14 DIAGNOSIS — M545 Low back pain, unspecified: Secondary | ICD-10-CM

## 2017-04-14 DIAGNOSIS — M5137 Other intervertebral disc degeneration, lumbosacral region: Secondary | ICD-10-CM

## 2017-04-24 ENCOUNTER — Ambulatory Visit (INDEPENDENT_AMBULATORY_CARE_PROVIDER_SITE_OTHER): Payer: BLUE CROSS/BLUE SHIELD | Admitting: Family Medicine

## 2017-04-24 ENCOUNTER — Encounter: Payer: Self-pay | Admitting: Family Medicine

## 2017-04-24 VITALS — BP 134/89 | HR 109 | Wt 168.0 lb

## 2017-04-24 DIAGNOSIS — M545 Low back pain, unspecified: Secondary | ICD-10-CM

## 2017-04-24 DIAGNOSIS — M7061 Trochanteric bursitis, right hip: Secondary | ICD-10-CM | POA: Diagnosis not present

## 2017-04-24 DIAGNOSIS — G8929 Other chronic pain: Secondary | ICD-10-CM

## 2017-04-24 DIAGNOSIS — I700 Atherosclerosis of aorta: Secondary | ICD-10-CM

## 2017-04-24 NOTE — Patient Instructions (Addendum)
Thank you for coming in today. Attend PT.  Do the stretching and side leg raises. 30 reps 2x daily.  Heat can help.  Recheck with me in 4-6 weeks.   Trochanteric Bursitis Trochanteric bursitis is a condition that causes hip pain. Trochanteric bursitis happens when fluid-filled sacs (bursae) in the hip get irritated. Normally these sacs absorb shock and help strong bands of tissue (tendons) in your hip glide smoothly over each other and over your hip bones. What are the causes? This condition results from increased friction between the hip bones and the tendons that go over them. This condition can happen if you:  Have weak hips.  Use your hip muscles too much (overuse).  Get hit in the hip.  What increases the risk? This condition is more likely to develop in:  Women.  Adults who are middle-aged or older.  People with arthritis or a spinal condition.  People with weak buttocks muscles (gluteal muscles).  People who have one leg that is shorter than the other.  People who participate in certain kinds of athletic activities, such as: ? Running sports, especially long-distance running. ? Contact sports, like football or martial arts. ? Sports in which falls may occur, like skiing.  What are the signs or symptoms? The main symptom of this condition is pain and tenderness over the point of your hip. The pain may be:  Sharp and intense.  Dull and achy.  Felt on the outside of your thigh.  It may increase when you:  Lie on your side.  Walk or run.  Go up on stairs.  Sit.  Stand up after sitting.  Stand for long periods of time.  How is this diagnosed? This condition may be diagnosed based on:  Your symptoms.  Your medical history.  A physical exam.  Imaging tests, such as: ? X-rays to check your bones. ? An MRI or ultrasound to check your tendons and muscles.  During your physical exam, your health care provider will check the movement and strength of  your hip. He or she may press on the point of your hip to check for pain. How is this treated? This condition may be treated by:  Resting.  Reducing your activity.  Avoiding activities that cause pain.  Using crutches, a cane, or a walker to decrease the strain on your hip.  Taking medicine to help with swelling.  Having medicine injected into the bursae to help with swelling.  Using ice, heat, and massage therapy for pain relief.  Physical therapy exercises for strength and flexibility.  Surgery (rare).  Follow these instructions at home: Activity  Rest.  Avoid activities that cause pain.  Return to your normal activities as told by your health care provider. Ask your health care provider what activities are safe for you. Managing pain, stiffness, and swelling  Take over-the-counter and prescription medicines only as told by your health care provider.  If directed, apply heat to the injured area as told by your health care provider. ? Place a towel between your skin and the heat source. ? Leave the heat on for 20-30 minutes. ? Remove the heat if your skin turns bright red. This is especially important if you are unable to feel pain, heat, or cold. You may have a greater risk of getting burned.  If directed, apply ice to the injured area: ? Put ice in a plastic bag. ? Place a towel between your skin and the bag. ? Leave the ice on  for 20 minutes, 2-3 times a day. General instructions  If the affected leg is one that you use for driving, ask your health care provider when it is safe to drive.  Use crutches, a cane, or a walker as told by your health care provider.  If one of your legs is shorter than the other, get fitted for a shoe insert.  Lose weight if you are overweight. How is this prevented?  Wear supportive footwear that is appropriate for your sport.  If you have hip pain, start any new exercise or sport slowly.  Maintain physical fitness,  including: ? Strength. ? Flexibility. Contact a health care provider if:  Your pain does not improve with 2-4 weeks. Get help right away if:  You develop severe pain.  You have a fever.  You develop increased redness over your hip.  You have a change in your bowel function or bladder function.  You cannot control the muscles in your feet. This information is not intended to replace advice given to you by your health care provider. Make sure you discuss any questions you have with your health care provider. Document Released: 03/21/2004 Document Revised: 10/18/2015 Document Reviewed: 01/27/2015 Elsevier Interactive Patient Education  2018 [employer]    Trochanteric Bursitis Rehab Ask your health care provider which exercises are safe for you. Do exercises exactly as told by your health care provider and adjust them as directed. It is normal to feel mild stretching, pulling, tightness, or discomfort as you do these exercises, but you should stop right away if you feel sudden pain or your pain gets worse.Do not begin these exercises until told by your health care provider. Stretching exercises These exercises warm up your muscles and joints and improve the movement and flexibility of your hip. These exercises also help to relieve pain and stiffness. Exercise A: Iliotibial band stretch  1. Lie on your side with your left / right leg in the top position. 2. Bend your left / right knee and grab your ankle. 3. Slowly bring your knee back so your thigh is behind your body. 4. Slowly lower your knee toward the floor until you feel a gentle stretch on the outside of your left / right thigh. If you do not feel a stretch and your knee will not fall farther, place the heel of your other foot on top of your outer knee and pull your thigh down farther. 5. Hold this position for __________ seconds. 6. Slowly return to the starting position. Repeat __________ times. Complete this exercise  __________ times a day. Strengthening exercises These exercises build strength and endurance in your hip and pelvis. Endurance is the ability to use your muscles for a long time, even after they get tired. Exercise B: Bridge ( hip extensors) 1. Lie on your back on a firm surface with your knees bent and your feet flat on the floor. 2. Tighten your buttocks muscles and lift your buttocks off the floor until your trunk is level with your thighs. You should feel the muscles working in your buttocks and the back of your thighs. If this exercise is too easy, try doing it with your arms crossed over your chest. 3. Hold this position for __________ seconds. 4. Slowly return to the starting position. 5. Let your muscles relax completely between repetitions. Repeat __________ times. Complete this exercise __________ times a day. Exercise C: Squats ( knee extensors and  quadriceps) 1. Stand in front of a table, with your feet  and knees pointing straight ahead. You may rest your hands on the table for balance but not for support. 2. Slowly bend your knees and lower your hips like you are going to sit in a chair. ? Keep your weight over your heels, not over your toes. ? Keep your lower legs upright so they are parallel with the table legs. ? Do not let your hips go lower than your knees. ? Do not bend lower than told by your health care provider. ? If your hip pain increases, do not bend as low. 3. Hold this position for __________ seconds. 4. Slowly push with your legs to return to standing. Do not use your hands to pull yourself to standing. Repeat __________ times. Complete this exercise __________ times a day. Exercise D: Hip hike 1. Stand sideways on a bottom step. Stand on your left / right leg with your other foot unsupported next to the step. You can hold onto the railing or wall if needed for balance. 2. Keeping your knees straight and your torso square, lift your left / right hip up toward the  ceiling. 3. Hold this position for __________ seconds. 4. Slowly let your left / right hip lower toward the floor, past the starting position. Your foot should get closer to the floor. Do not lean or bend your knees. Repeat __________ times. Complete this exercise __________ times a day. Exercise E: Single leg stand 1. Stand near a counter or door frame that you can hold onto for balance as needed. It is helpful to stand in front of a mirror for this exercise so you can watch your hip. 2. Squeeze your left / right buttock muscles then lift up your other foot. Do not let your left / right hip push out to the side. 3. Hold this position for __________ seconds. Repeat __________ times. Complete this exercise __________ times a day. This information is not intended to replace advice given to you by your health care provider. Make sure you discuss any questions you have with your health care provider. Document Released: 03/21/2004 Document Revised: 10/19/2015 Document Reviewed: 01/27/2015 Elsevier Interactive Patient Education  [employer].

## 2017-04-25 NOTE — Progress Notes (Signed)
Molly Solis is a 62 y.o. female who [facility] today for back pain. Molly Solis is seen February 7 for chronic low back pain without significant radiating pain.  At that point she had effectively failed conservative management is the pain is been ongoing for months.  We proceeded with an MRI of her lumbar spine and she is here today to review her images.  She notes the pain in her back is moderate worse with activity.  She also notes pain in her right lateral hip more significant now than it was earlier this month.  She notes the back pain is worse with activity as is the lateral hip pain.  The lateral hip pain is worse with standing from a seated position and lying on her right side.  She denies any radiating pain weakness or numbness.  She denies any fevers or chills.   Past Medical History:  Diagnosis Date  . Chronic back pain 04/03/2017  . History of transient ischemic attack (TIA) 04/03/2017  . HTN (hypertension) 04/03/2017  . Hypothyroid 04/03/2017  . 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 ABDOMINAL HYSTERECTOMY  1998  . APPENDECTOMY  2004  . CHOLECYSTECTOMY  2005  . OOPHORECTOMY Right 1998  . shoulder surgery Left 2010  . SPINE SURGERY     Cspine 2013, 2015   Social History   Tobacco Use  . Smoking status: Former Smoker    Packs/day: 1.00    Years: 10.00    Pack years: 10.00    Types: Cigarettes    Last attempt to quit: 02/26/1996    Years since quitting: 21.1  . Smokeless tobacco: Never Used  Substance Use Topics  . Alcohol use: No    Frequency: Never     ROS:  As above   Medications: Current Outpatient Medications  Medication Sig Dispense Refill  . aspirin 81 MG chewable tablet Chew by mouth daily.    . cyclobenzaprine (FLEXERIL) 10 MG tablet Take 1 tablet (10 mg total) by mouth at bedtime. 90 tablet 1  . levothyroxine (SYNTHROID,  LEVOTHROID) 75 MCG tablet Take 1 tablet (75 mcg total) by mouth daily before breakfast. 90 tablet 1  . lisinopril (PRINIVIL,ZESTRIL) 10 MG tablet Take 1 tablet (10 mg total) by mouth daily. 90 tablet 1   No current facility-administered medications for this visit.    Allergies  Allergen Reactions  . Penicillins Rash     Exam:  BP 134/89   Pulse (!) 109   Wt 168 lb (76.2 kg)   BMI 32.81 kg/m  General: Well Developed, well nourished, and in no acute distress.  Neuro/Psych: Alert and oriented x3, extra-ocular muscles intact, able to move all 4 extremities, sensation grossly intact. Skin: Warm and dry, no rashes noted.  Respiratory: Not using accessory muscles, speaking in full sentences, trachea midline.  Heart rate 88 bpm per my check Cardiovascular: Pulses palpable, no extremity edema. Abdomen: Does not appear distended. MSK:  L-spine: Nontender to spinal midline.  Tender to palpation bilateral lumbar paraspinal muscles. Normal motion pain with extension  Right hip: Normal-appearing.   Tender to palpation greater trochanter. Normal range of motion. Hip abduction strength is decreased 4/5 and reproducing pain  Normal gait.  CLINICAL DATA:  Low back and right buttock pain radiating down to the knee. Bilateral foot tingling.  EXAM: MRI LUMBAR SPINE WITHOUT CONTRAST  TECHNIQUE: Multiplanar, multisequence MR  imaging of the lumbar spine was performed. No intravenous contrast was administered.  COMPARISON:  Radiographs 04/03/2017  FINDINGS: Segmentation: There are five lumbar type vertebral bodies. The last full intervertebral disc space is labeled L5-S1. This correlates with the radiographs  Alignment:  Normal  Vertebrae:  Normal marrow signal.  No fracture or bone lesion  Conus medullaris and cauda equina: Conus extends to the L2 level. Conus and cauda equina appear normal.  Paraspinal and other soft tissues: No significant findings. Small renal  cysts.  Disc levels:  T12-L1: No significant findings.  L1-2: No significant findings.  L2-3: No significant findings.  L3-4: Mild annular bulge but the spinal canal is generous in is no significant spinal, lateral recess or foraminal stenosis. Mild right lateral recess encroachment.  L4-5: No disc protrusions, spinal or foraminal stenosis. Mild facet disease.  L5-S1: Degenerate disc disease and degenerative facet disease. There is a bulging degenerated annulus and mild osteophytic spurring but no significant spinal or foraminal stenosis.  IMPRESSION: 1. Normal alignment and no acute bony findings. 2. The conus medullaris ends at L2. 3. Mild degenerate disc disease at L5-S1 but no significant spinal or foraminal stenosis.   Electronically Signed   By: Rudie Meyer M.D.   On: 04/14/2017 21:43  I personally (independently) visualized and performed the interpretation of the images attached in this note.    Assessment and Plan: 62 y.o. female with  Chronic back pain: We discussed in detail the MRI results.  I believe although she does have some degenerative changes in her facet joints especially right L4-L5 and L5-S1 the majority of her back pain is myofascial in nature.  We discussed that although there are some potential interventions that can be done in her back the best step now is very likely to be physical therapy.  Plan to recheck in 6 weeks if not improved.  Right lateral hip pain: New diagnosis today is very likely greater trochanteric bursitis versus hip abductor tendinopathy.  This often corresponds with exacerbation of back muscular strain.  Plan for home exercise program and physical therapy as noted above.  We will recheck this issue in 6 weeks.  If not better next step would include injection.  Additionally we discussed the diagnosis of aortic atherosclerosis.  This was seen on her lumbar x-ray and added to her problem list.  We discussed that this  would support lowering of LDL to a goal of 70-100.  We will follow this issue up at further detail in the near future.    Orders Placed This Encounter  Procedures  . Ambulatory referral to Physical Therapy    Referral Priority:   Routine    Referral Type:   Physical Medicine    Referral Reason:   Specialty Services Required    Requested Specialty:   Physical Therapy   No orders of the defined types were placed in this encounter.   Discussed warning signs or symptoms. Please see discharge instructions. Patient expresses understanding.

## 2017-04-28 ENCOUNTER — Ambulatory Visit: Payer: BLUE CROSS/BLUE SHIELD | Admitting: Family Medicine

## 2017-05-12 ENCOUNTER — Ambulatory Visit: Payer: BLUE CROSS/BLUE SHIELD | Admitting: Physical Therapy

## 2017-08-01 ENCOUNTER — Ambulatory Visit (INDEPENDENT_AMBULATORY_CARE_PROVIDER_SITE_OTHER): Payer: BLUE CROSS/BLUE SHIELD | Admitting: Family Medicine

## 2017-08-01 ENCOUNTER — Encounter: Payer: Self-pay | Admitting: Family Medicine

## 2017-08-01 VITALS — BP 137/73 | HR 100 | Wt 168.0 lb

## 2017-08-01 DIAGNOSIS — I10 Essential (primary) hypertension: Secondary | ICD-10-CM | POA: Diagnosis not present

## 2017-08-01 DIAGNOSIS — E039 Hypothyroidism, unspecified: Secondary | ICD-10-CM

## 2017-08-01 DIAGNOSIS — Z6832 Body mass index (BMI) 32.0-32.9, adult: Secondary | ICD-10-CM | POA: Diagnosis not present

## 2017-08-01 DIAGNOSIS — E669 Obesity, unspecified: Secondary | ICD-10-CM

## 2017-08-01 DIAGNOSIS — M7061 Trochanteric bursitis, right hip: Secondary | ICD-10-CM

## 2017-08-01 DIAGNOSIS — E6609 Other obesity due to excess calories: Secondary | ICD-10-CM | POA: Diagnosis not present

## 2017-08-01 HISTORY — DX: Obesity, unspecified: E66.9

## 2017-08-01 MED ORDER — LORCASERIN HCL 10 MG PO TABS: 1.0000 | ORAL_TABLET | Freq: Two times a day (BID) | ORAL | 2 refills | Status: AC

## 2017-08-01 MED ORDER — CYCLOBENZAPRINE HCL 10 MG PO TABS: 10.0000 mg | ORAL_TABLET | Freq: Every day | ORAL | 1 refills | Status: AC

## 2017-08-01 MED ORDER — LEVOTHYROXINE SODIUM 75 MCG PO TABS: 75.0000 ug | ORAL_TABLET | Freq: Every day | ORAL | 1 refills | Status: AC

## 2017-08-01 MED ORDER — LISINOPRIL 10 MG PO TABS: 10.0000 mg | ORAL_TABLET | Freq: Every day | ORAL | 1 refills | Status: AC

## 2017-08-01 NOTE — Progress Notes (Signed)
Molly Solis is a 62 y.o. female who [facility] today for follow-up hip pain but also discuss hypertension hypothyroidism and obesity.  Molly Solis was seen in February for hip pain and notes that she has had near complete resolution of symptoms with home exercise program.  She feels quite well.  She notes her main issue now is her weight.  She has had difficulty losing weight.  She is trying to exercise and walks 40 minutes daily.  She tries to eat a careful diet focusing on lower carbohydrates but is not measuring her calories.  She has not lost much weight at all.  In the past she had some success with phentermine but would like to avoid that if possible.  Additionally patient notes hypertension and hypothyroidism.  These are both managed with lisinopril and levothyroxine.  She feels quite well with no symptoms.   ROS as above:  Exam:  BP 137/73   Pulse 100   Wt 168 lb (76.2 kg)   BMI 32.81 kg/m   Wt Readings from Last 10 Encounters:  08/01/17 168 lb (76.2 kg)  04/24/17 168 lb (76.2 kg)  04/03/17 170 lb (77.1 kg)    Gen: Well NAD HEENT: EOMI,  MMM Lungs: Normal work of breathing. CTABL Heart: RRR no MRG Abd: NABS, Soft. Nondistended, Nontender Exts: Brisk capillary refill, warm and well perfused.     Assessment and Plan: 62 y.o. female with  Obesity: BMI 32 with co-factors including hypertension and hypothyroidism.  Discussed calorie goals and treatment options.  Her basal metabolic rate is about 1500 cal a day.  Recommend continue exercise and 1500-calorie diet.  This should result in less than 1 pound of weight loss per week but should be sustainable.  Recommend getting 30% of her calories or protein.   We additionally discussed medication for weight loss.  After reviewing all the medicines plan to try using Belviq.  Hypertension: Doing quite  well continue current regimen.  Creatinine checked in February.  Hypothyroidism: Doing well continue current dose of levothyroxine.  Last checked in February.  Hip pain: Doing well continue home exercise program.  We will try to obtain records from previous primary care provider regarding colonoscopy.  No orders of the defined types were placed in this encounter.  Meds ordered this encounter  Medications  . lisinopril (PRINIVIL,ZESTRIL) 10 MG tablet    Sig: Take 1 tablet (10 mg total) by mouth daily.    Dispense:  90 tablet    Refill:  1  . levothyroxine (SYNTHROID, LEVOTHROID) 75 MCG tablet    Sig: Take 1 tablet (75 mcg total) by mouth daily before breakfast.    Dispense:  90 tablet    Refill:  1  . cyclobenzaprine (FLEXERIL) 10 MG tablet    Sig: Take 1 tablet (10 mg total) by mouth at bedtime.    Dispense:  90 tablet    Refill:  1  . Lorcaserin HCl 10 MG TABS    Sig: Take 1 tablet by mouth 2 (two) times daily.    Dispense:  60 tablet    Refill:  2     Historical information moved to improve visibility of documentation.  Past Medical History:  Diagnosis Date  . Chronic back pain 04/03/2017  . History of transient ischemic attack (TIA) 04/03/2017  . HTN (hypertension) 04/03/2017  . Hypothyroid 04/03/2017  . 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 ABDOMINAL HYSTERECTOMY  1998  . APPENDECTOMY  2004  . CHOLECYSTECTOMY  2005  . OOPHORECTOMY Right 1998  . shoulder surgery Left 2010  . SPINE SURGERY     Cspine 2013, 2015   Social History   Tobacco Use  . Smoking status: Former Smoker    Packs/day: 1.00    Years: 10.00    Pack years: 10.00    Types: Cigarettes    Last attempt to quit: 02/26/1996    Years since quitting: 21.4  . Smokeless tobacco: Never Used  Substance Use Topics  . Alcohol use: No    Frequency: Never   family history includes Alcohol abuse in her brother and father; Cancer in  her sister; Diabetes in her brother; Heart disease in her mother; Stroke in her brother.  Medications: Current Outpatient Medications  Medication Sig Dispense Refill  . aspirin 81 MG chewable tablet Chew by mouth daily.    . cyclobenzaprine (FLEXERIL) 10 MG tablet Take 1 tablet (10 mg total) by mouth at bedtime. 90 tablet 1  . levothyroxine (SYNTHROID, LEVOTHROID) 75 MCG tablet Take 1 tablet (75 mcg total) by mouth daily before breakfast. 90 tablet 1  . lisinopril (PRINIVIL,ZESTRIL) 10 MG tablet Take 1 tablet (10 mg total) by mouth daily. 90 tablet 1  . Lorcaserin HCl 10 MG TABS Take 1 tablet by mouth 2 (two) times daily. 60 tablet 2   No current facility-administered medications for this visit.    Allergies  Allergen Reactions  . Penicillins Rash    Health Maintenance Health Maintenance  Topic Date Due  . COLONOSCOPY  12/24/2006  . INFLUENZA VACCINE  12/04/2017 (Originally 09/25/2017)  . MAMMOGRAM  04/10/2019  . TETANUS/TDAP  09/26/2022  . Hepatitis C Screening  Completed  . HIV Screening  Completed    Discussed warning signs or symptoms. Please see discharge instructions. Patient expresses understanding.

## 2017-08-01 NOTE — Patient Instructions (Signed)
Thank you for coming in today. Start belviq Continue exercise and diet.  Try to eat 1500 calories a day.  Try to get 30% of your calories as protein.  Use a food log like myfitnesspal.  Do not add exercise calories.

## 2017-08-06 ENCOUNTER — Encounter: Payer: Self-pay | Admitting: Family Medicine

## 2017-08-06 NOTE — Progress Notes (Signed)
Labs received from [facility]. Labs abstracted. No colonoscopy report is available.   No cholesterol labs received. TSH is right at the low end of normal at 0.4 Vitamin D is a bit low.

## 2017-08-07 ENCOUNTER — Encounter: Payer: Self-pay | Admitting: Family Medicine

## 2017-08-07 NOTE — Progress Notes (Signed)
Formatting of this note might be different from the original.  Lipid panel received.  Electronically signed by Rodolph Bongorey, Evan S, MD at 08/07/2017 12:56 PM EDT

## 2017-08-07 NOTE — Progress Notes (Signed)
Lipid panel received.

## 2018-07-23 ENCOUNTER — Telehealth: Attending: Nurse Practitioner | Primary: Nurse Practitioner

## 2018-07-23 ENCOUNTER — Telehealth
Admit: 2018-07-23 | Discharge: 2018-07-23 | Payer: MEDICARE | Attending: Nurse Practitioner | Primary: Nurse Practitioner

## 2018-07-23 DIAGNOSIS — I10 Essential (primary) hypertension: Secondary | ICD-10-CM

## 2018-07-23 MED ORDER — LISINOPRIL 20 MG TAB
20 mg | ORAL_TABLET | Freq: Every day | ORAL | 1 refills | Status: DC
Start: 2018-07-23 — End: 2018-10-21

## 2018-07-23 MED ORDER — LEVOTHYROXINE 75 MCG TAB
75 mcg | ORAL_TABLET | Freq: Every day | ORAL | 1 refills | Status: DC
Start: 2018-07-23 — End: 2018-10-21

## 2018-07-23 MED ORDER — CYCLOBENZAPRINE 10 MG TAB
10 mg | ORAL_TABLET | Freq: Every evening | ORAL | 0 refills | Status: DC
Start: 2018-07-23 — End: 2018-08-13

## 2018-07-23 NOTE — Progress Notes (Signed)
Progress Notes by Erskine Squibbothran-Pate, Chamille Werntz L, NP at 07/23/18 1540                Author: Erskine Squibbothran-Pate, Jehan Bonano L, NP  Service: --  Author Type: Nurse Practitioner       Filed: 07/23/18 1720  Encounter Date: 07/23/2018  Status: Signed          Editor: Erskine Squibbothran-Pate, Ryane Konieczny L, NP (Nurse Practitioner)                                  [facility]   [address]   [place], [place] [id number]    (ph) [phone] (fax) [phone]   Jebediah Macrae L. Cothran-Pate APRN, FNP-C             63 year old female logs in virtually through my chart to get established as a new patient.  She is currently followed  for hypertension and hypothyroidism.  She reports that she takes Flexeril at night for muscle spasms and it works great.  She reports that her blood pressure has always been good while taking the lisinopril.  She would like to lose some weight and has  been on a weight reduction drug in the past.  She reports feeling good and denies any major issues.  She is behind on her health maintenance and would like to get caught up.       Consent: Sandra RumbleDana Marie Harmon, who was seen by synchronous (real-time) audio-video technology, and/or her healthcare decision maker, is aware that this patient-initiated, Telehealth encounter on  07/23/2018 is a billable service, with coverage as determined by her insurance carrier. She is aware that she may receive a bill and has provided verbal consent to proceed: Yes.               Allergies        Allergen  Reactions         ?  Lipitor [Atorvastatin]  Other (comments)             Muscle pain         ?  Lortab [Hydrocodone-Acetaminophen]  Itching         ?  Penicillins  Hives             Past Medical History:        Diagnosis  Date         ?  Arthritis       ?  Hypertension       ?  Thyroid disease            hypothyroid             Family History         Problem  Relation  Age of Onset          ?  Lung Disease  Mother       ?  Asthma  Mother       ?  Liver Disease  Father        ?  Stroke  Brother            ?  Diabetes  Brother               Social History          Socioeconomic History         ?  Marital status:  MARRIED  Spouse name:  Not on file         ?  Number of children:  Not on file     ?  Years of education:  Not on file     ?  Highest education level:  Not on file       Occupational History        ?  Not on file       Social Needs         ?  Financial resource strain:  Not on file        ?  Food insecurity              Worry:  Not on file         Inability:  Not on file        ?  Transportation needs              Medical:  Not on file         Non-medical:  Not on file       Tobacco Use         ?  Smoking status:  Former Smoker              Packs/day:  1.00         Years:  15.00         Pack years:  15.00         Last attempt to quit:  10/25/1996         Years since quitting:  21.7         ?  Smokeless tobacco:  Never Used       Substance and Sexual Activity         ?  Alcohol use:  No     ?  Drug use:  No     ?  Sexual activity:  Not on file       Lifestyle        ?  Physical activity              Days per week:  Not on file         Minutes per session:  Not on file         ?  Stress:  Not on file       Relationships        ?  Social Engineer, manufacturing systems on phone:  Not on file         Gets together:  Not on file         Attends religious service:  Not on file         Active member of club or organization:  Not on file         Attends meetings of clubs or organizations:  Not on file         Relationship status:  Not on file        ?  Intimate partner violence              Fear of current or ex partner:  Not on file         Emotionally abused:  Not on file         Physically abused:  Not on file         Forced sexual activity:  Not on file        Other Topics  Concern        ?  Not on file       Social History Narrative        ?  Not on file             OB History                Gravida      4                Para      4                Term                        Preterm                       AB                       Living                                    SAB                       TAB                       Ectopic                       Molar                       Multiple                       Live Births                                             Past Surgical History:         Procedure  Laterality  Date          ?  HX APPENDECTOMY         ?  HX CERVICAL DISKECTOMY    09/16/06          Anterior C5-C6 diskectomy with decompression;  anterior cervical arthrodesis interbody technique;  anterior cervical instrumentation;  tricortical  iliac crest bone graft harvested through a separate incision.           ?  HX CHOLECYSTECTOMY              ?  HX TAH AND BSO                 Health Maintenance        Topic  Date Due         ?  Hepatitis C Screening   13-Aug-1955     ?  DTaP/Tdap/Td series (1 - Tdap)  12/23/1977     ?  PAP AKA CERVICAL CYTOLOGY   12/23/1977     ?  Lipid Screen   12/23/1996     ?  Shingrix Vaccine Age 52> (1 of 2)  12/24/2006     ?  Breast Cancer Screen Mammogram   12/24/2006     ?  FOBT Q1Y Age 76-75   12/24/2006     ?  Influenza Age 33 to Adult   09/26/2018     ?  Bone Densitometry (Dexa) Screening   12/23/2021         ?  Pneumococcal 0-64 years   Aged Out              Current Outpatient Medications:    ?  cyclobenzaprine (Flexeril) 10 mg tablet, Take 1 Tab by mouth nightly., Disp: 30 Tab, Rfl: 0   ?  levothyroxine (SYNTHROID) 75 mcg tablet, Take 1 Tab by mouth Daily (before breakfast)., Disp: 30 Tab, Rfl: 1   ?  lisinopriL (PRINIVIL, ZESTRIL) 20 mg tablet, Take 0.5 Tabs by mouth daily., Disp: 30 Tab, Rfl: 1      Review of Systems    Constitutional: Negative.     HENT: Negative.     Eyes: Negative.     Respiratory: Negative.     Cardiovascular: Negative.     Gastrointestinal: Negative.     Genitourinary: Negative.     Musculoskeletal: Negative.     Skin: Negative.     Neurological: Negative.     Endo/Heme/Allergies: Negative.     Psychiatric/Behavioral:  Negative.     All other systems reviewed and are negative.         Visit Vitals      Ht  5' (1.524 m)     Wt  170 lb (77.1 kg)        BMI  33.20 kg/m??         Vitals per pt   120/80         Physical Exam   Constitutional :        Appearance: She is well-developed.   HENT :       Head: Normocephalic.    Pulmonary:       Effort: Pulmonary effort is normal.    Neurological:       Mental Status: She is alert and oriented to person, place, and time.    Psychiatric:         Mood and Affect: Mood normal.         Behavior: Behavior normal.         Thought Content: Thought content normal.         Judgment: Judgment normal.          PHQ2=0            Assessment & Plan:   1. Essential hypertension   - lisinopriL (PRINIVIL, ZESTRIL) 20 mg tablet; Take 0.5 Tabs by mouth daily.  Dispense: 30 Tab; Refill: 1      2. Hypothyroidism, unspecified type   - levothyroxine (SYNTHROID) 75 mcg tablet; Take 1 Tab by mouth Daily (before breakfast).  Dispense: 30 Tab; Refill: 1      3. Hypomagnesemia         4. Obesity (BMI 30.0-34.9)         5. Breast screening      - MAM MAMMO BI SCREENING INCL CAD; Future      6. Postmenopause   - DEXA BONE DENSITY STUDY AXIAL; Future      7. Colon cancer screening   - REFERRAL FOR COLONOSCOPY      8. Night muscle spasms   - cyclobenzaprine (Flexeril) 10 mg tablet; Take 1 Tab by mouth nightly.  Dispense: 30 Tab; Refill: 0         Will notify of labs. OV  in 1 month. This note was dictated using dragon voice recognition software.  It has been proofread, but there may still exist voice recognition errors that the author did not detect.         This virtual visit was conducted via MyChart. Pursuant to the emergency declaration under the [facility] Act and the [employer], 1135 waiver authority and the [employer]  and [employer] Act, this Virtual  Visit was conducted to reduce the patient's risk of exposure to COVID-19 and provide continuity of care for an  established patient. Services were provided through a video synchronous discussion  virtually to substitute for in-person clinic visit.  Due to this being a TeleHealth evaluation, many elements of the physical examination are unable to be assessed.       Total Time: minutes: 11-20 minutes.             Signed By:  Erskine Squibb, NP           Jul 23, 2018

## 2018-07-23 NOTE — Progress Notes (Signed)
[facility]  [address]  [place], [place] [id number]   (ph) [phone] (fax) [phone]  Sandra Mayfield L. Cothran-Pate APRN, FNP-C         63 year old female logs in virtually through my chart to get established as a new patient.  She is currently followed for hypertension and hypothyroidism.  She reports that she takes Flexeril at night for muscle spasms and it works great.  She reports that her blood pressure has always been good while taking the lisinopril.  She would like to lose some weight and has been on a weight reduction drug in the past.  She reports feeling good and denies any major issues.  She is behind on her health maintenance and would like to get caught up.     Consent: Sandra RumbleDana Marie Harmon, who was seen by synchronous (real-time) audio-video technology, and/or her healthcare decision maker, is aware that this patient-initiated, Telehealth encounter on 07/23/2018 is a billable service, with coverage as determined by her insurance carrier. She is aware that she may receive a bill and has provided verbal consent to proceed: Yes.         Allergies   Allergen Reactions   ??? Lipitor [Atorvastatin] Other (comments)     Muscle pain   ??? Lortab [Hydrocodone-Acetaminophen] Itching   ??? Penicillins Hives       Past Medical History:   Diagnosis Date   ??? Arthritis    ??? Hypertension    ??? Thyroid disease     hypothyroid       Family History   Problem Relation Age of Onset   ??? Lung Disease Mother    ??? Asthma Mother    ??? Liver Disease Father    ??? Stroke Brother    ??? Diabetes Brother        Social History     Socioeconomic History   ??? Marital status: MARRIED     Spouse name: Not on file   ??? Number of children: Not on file   ??? Years of education: Not on file   ??? Highest education level: Not on file   Occupational History   ??? Not on file   Social Needs   ??? Financial resource strain: Not on file   ??? Food insecurity     Worry: Not on file     Inability: Not on file    ??? Transportation needs     Medical: Not on file     Non-medical: Not on file   Tobacco Use   ??? Smoking status: Former Smoker     Packs/day: 1.00     Years: 15.00     Pack years: 15.00     Last attempt to quit: 10/25/1996     Years since quitting: 21.7   ??? Smokeless tobacco: Never Used   Substance and Sexual Activity   ??? Alcohol use: No   ??? Drug use: No   ??? Sexual activity: Not on file   Lifestyle   ??? Physical activity     Days per week: Not on file     Minutes per session: Not on file   ??? Stress: Not on file   Relationships   ??? Social Wellsite geologistconnections     Talks on phone: Not on file     Gets together: Not on file     Attends religious service: Not on file     Active member of club or organization: Not on file  Attends meetings of clubs or organizations: Not on file     Relationship status: Not on file   ??? Intimate partner violence     Fear of current or ex partner: Not on file     Emotionally abused: Not on file     Physically abused: Not on file     Forced sexual activity: Not on file   Other Topics Concern   ??? Not on file   Social History Narrative   ??? Not on file       OB History     Gravida   4    Para   4    Term        Preterm        AB        Living           SAB        TAB        Ectopic        Molar        Multiple        Live Births                    Past Surgical History:   Procedure Laterality Date   ??? HX APPENDECTOMY     ??? HX CERVICAL DISKECTOMY  09/16/06    Anterior C5-C6 diskectomy with decompression;  anterior cervical arthrodesis interbody technique;  anterior cervical instrumentation;  tricortical iliac crest bone graft harvested through a separate incision.    ??? HX CHOLECYSTECTOMY     ??? HX TAH AND BSO         Health Maintenance   Topic Date Due   ??? Hepatitis C Screening  Feb 10, 1956   ??? DTaP/Tdap/Td series (1 - Tdap) 12/23/1977   ??? PAP AKA CERVICAL CYTOLOGY  12/23/1977   ??? Lipid Screen  12/23/1996   ??? Shingrix Vaccine Age 48> (1 of 2) 12/24/2006   ??? Breast Cancer Screen Mammogram  12/24/2006    ??? FOBT Q1Y Age 48-75  12/24/2006   ??? Influenza Age 1 to Adult  09/26/2018   ??? Bone Densitometry (Dexa) Screening  12/23/2021   ??? Pneumococcal 0-64 years  Aged Out         Current Outpatient Medications:   ???  cyclobenzaprine (Flexeril) 10 mg tablet, Take 1 Tab by mouth nightly., Disp: 30 Tab, Rfl: 0  ???  levothyroxine (SYNTHROID) 75 mcg tablet, Take 1 Tab by mouth Daily (before breakfast)., Disp: 30 Tab, Rfl: 1  ???  lisinopriL (PRINIVIL, ZESTRIL) 20 mg tablet, Take 0.5 Tabs by mouth daily., Disp: 30 Tab, Rfl: 1    Review of Systems   Constitutional: Negative.    HENT: Negative.    Eyes: Negative.    Respiratory: Negative.    Cardiovascular: Negative.    Gastrointestinal: Negative.    Genitourinary: Negative.    Musculoskeletal: Negative.    Skin: Negative.    Neurological: Negative.    Endo/Heme/Allergies: Negative.    Psychiatric/Behavioral: Negative.    All other systems reviewed and are negative.      Visit Vitals  Ht 5' (1.524 m)   Wt 170 lb (77.1 kg)   BMI 33.20 kg/m??      Vitals per pt  120/80      Physical Exam  Constitutional:       Appearance: She is well-developed.   HENT:      Head: Normocephalic.   Pulmonary:  Effort: Pulmonary effort is normal.   Neurological:      Mental Status: She is alert and oriented to person, place, and time.   Psychiatric:         Mood and Affect: Mood normal.         Behavior: Behavior normal.         Thought Content: Thought content normal.         Judgment: Judgment normal.       PHQ2=0        Assessment & Plan:  1. Essential hypertension  - lisinopriL (PRINIVIL, ZESTRIL) 20 mg tablet; Take 0.5 Tabs by mouth daily.  Dispense: 30 Tab; Refill: 1    2. Hypothyroidism, unspecified type  - levothyroxine (SYNTHROID) 75 mcg tablet; Take 1 Tab by mouth Daily (before breakfast).  Dispense: 30 Tab; Refill: 1    3. Hypomagnesemia      4. Obesity (BMI 30.0-34.9)      5. Breast screening    - MAM MAMMO BI SCREENING INCL CAD; Future    6. Postmenopause   - DEXA BONE DENSITY STUDY AXIAL; Future    7. Colon cancer screening  - REFERRAL FOR COLONOSCOPY    8. Night muscle spasms  - cyclobenzaprine (Flexeril) 10 mg tablet; Take 1 Tab by mouth nightly.  Dispense: 30 Tab; Refill: 0      Will notify of labs. OV in 1 month. This note was dictated using dragon voice recognition software.  It has been proofread, but there may still exist voice recognition errors that the author did not detect.      This virtual visit was conducted via MyChart. Pursuant to the emergency declaration under the [facility] Act and the [employer], 1135 waiver authority and the [employer] Act, this Virtual  Visit was conducted to reduce the patient's risk of exposure to COVID-19 and provide continuity of care for an established patient. Services were provided through a video synchronous discussion virtually to substitute for in-person clinic visit.  Due to this being a TeleHealth evaluation, many elements of the physical examination are unable to be assessed.     Total Time: minutes: 11-20 minutes.       Signed By: Erskine Squibb, NP     Jul 23, 2018

## 2018-07-23 NOTE — Telephone Encounter (Signed)
Labs now    OV in 1 mth  Thanks

## 2018-07-24 ENCOUNTER — Encounter

## 2018-07-24 NOTE — Progress Notes (Signed)
Patient notified and verbalized understanding  Pt scheduled

## 2018-07-24 NOTE — Progress Notes (Signed)
Results to patient please.  Magnesium is within normal limits. Glucose=100. Thyroid is normal. Chol=175, Trig=149, HDL=59, LDL=86. Please schedule her for an ov in 2 weeks to get Adipex. We need to check her BP and do a weigh in.     thanks

## 2018-07-24 NOTE — Progress Notes (Signed)
Results to patient please.  Magnesium is within normal limits. Glucose=100. Thyroid is normal. Chol=175, Trig=149, HDL=59, LDL=86. Please schedule her for an ov in 2 weeks to get Adipex. We need to check her BP and do a weigh in.     thanks

## 2018-07-25 ENCOUNTER — Inpatient Hospital Stay: Admit: 2018-07-25 | Payer: PRIVATE HEALTH INSURANCE | Attending: Nurse Practitioner | Primary: Nurse Practitioner

## 2018-07-25 DIAGNOSIS — Z780 Asymptomatic menopausal state: Secondary | ICD-10-CM

## 2018-07-25 NOTE — Progress Notes (Signed)
Dexa revealed     IMPRESSION:    Using the World Health Organization criteria, the bone mineral density is low.   There has been statistically significant decreases in bone mineral density  within the lumbar spine and proximal femora of 10.7 and 13.6%, respectively.    Continue her calcium and vitamin D daily

## 2018-07-25 NOTE — Progress Notes (Signed)
Patient notified and verbalized understanding  Pt wanted to know if you are going to send in the weight lose medication she had a bp check today

## 2018-07-25 NOTE — Progress Notes (Signed)
Patient notified and verbalized understanding  Pt wanted to know if you are going to send in the weight lose medication she had a bp check today

## 2018-07-28 NOTE — Progress Notes (Signed)
Dexa revealed     IMPRESSION:    Using the World Health Organization criteria, the bone mineral density is low.   There has been statistically significant decreases in bone mineral density  within the lumbar spine and proximal femora of 10.7 and 13.6%, respectively.    Continue her calcium and vitamin D daily

## 2018-07-29 ENCOUNTER — Encounter: Admit: 2018-07-29 | Discharge: 2018-07-29 | Payer: PRIVATE HEALTH INSURANCE | Primary: Nurse Practitioner

## 2018-07-29 ENCOUNTER — Institutional Professional Consult (permissible substitution): Admit: 2018-07-29 | Discharge: 2018-07-29 | Payer: PRIVATE HEALTH INSURANCE | Primary: Nurse Practitioner

## 2018-07-29 DIAGNOSIS — I10 Essential (primary) hypertension: Secondary | ICD-10-CM

## 2018-07-30 LAB — LIPID PANEL WITH LDL/HDL RATIO
Cholesterol, Total: 175 mg/dL (ref 100–199)
Cholesterol, total: 175 mg/dL (ref 100–199)
HDL Cholesterol: 59 mg/dL (ref >39)
HDL: 59 mg/dL (ref >39)
LDL Calculated: 86 mg/dL (ref 0–99)
LDL, calculated: 86 mg/dL (ref 0–99)
LDL/HDL Ratio: 1.5 ratio (ref 0.0–3.2)
LDL/HDL Ratio: 1.5 ratio (ref 0.0–3.2)
Triglyceride: 149 mg/dL (ref 0–149)
Triglycerides: 149 mg/dL (ref 0–149)
VLDL Cholesterol Calculated: 30 mg/dL (ref 5–40)
VLDL, calculated: 30 mg/dL (ref 5–40)

## 2018-07-30 LAB — CBC WITH AUTOMATED DIFF
ABS. BASOPHILS: 0 10*3/uL (ref 0.0–0.2)
ABS. EOSINOPHILS: 0.1 10*3/uL (ref 0.0–0.4)
ABS. IMM. GRANS.: 0 10*3/uL (ref 0.0–0.1)
ABS. MONOCYTES: 0.3 10*3/uL (ref 0.1–0.9)
ABS. NEUTROPHILS: 1.5 10*3/uL (ref 1.4–7.0)
Abs Lymphocytes: 2.4 10*3/uL (ref 0.7–3.1)
BASOPHILS: 1 %
EOSINOPHILS: 3 %
HCT: 43.3 % (ref 34.0–46.6)
HGB: 14.2 g/dL (ref 11.1–15.9)
IMMATURE GRANULOCYTES: 0 %
Lymphocytes: 55 %
MCH: 30.2 pg (ref 26.6–33.0)
MCHC: 32.8 g/dL (ref 31.5–35.7)
MCV: 92 fL (ref 79–97)
MONOCYTES: 7 %
NEUTROPHILS: 34 %
PLATELET: 213 10*3/uL (ref 150–450)
RBC: 4.7 x10E6/uL (ref 3.77–5.28)
RDW: 12.7 % (ref 11.7–15.4)
WBC: 4.4 10*3/uL (ref 3.4–10.8)

## 2018-07-30 LAB — TSH AND FREE T4
T4, Free: 1.09 ng/dL (ref 0.82–1.77)
TSH: 2.86 u[IU]/mL (ref 0.450–4.500)

## 2018-07-30 LAB — METABOLIC PANEL, COMPREHENSIVE
A-G Ratio: 2.1 (ref 1.2–2.2)
ALT (SGPT): 23 IU/L (ref 0–32)
AST (SGOT): 21 IU/L (ref 0–40)
Albumin: 4.5 g/dL (ref 3.8–4.8)
Alk. phosphatase: 76 IU/L (ref 39–117)
BUN/Creatinine ratio: 16 (ref 12–28)
BUN: 12 mg/dL (ref 8–27)
Bilirubin, total: 0.3 mg/dL (ref 0.0–1.2)
CO2: 22 mmol/L (ref 20–29)
Calcium: 9.6 mg/dL (ref 8.7–10.3)
Chloride: 105 mmol/L (ref 96–106)
Creatinine: 0.75 mg/dL (ref 0.57–1.00)
GFR est AA: 99 mL/min/{1.73_m2} (ref >59)
GFR est non-AA: 86 mL/min/{1.73_m2} (ref >59)
GLOBULIN, TOTAL: 2.1 g/dL (ref 1.5–4.5)
Glucose: 100 mg/dL — ABNORMAL HIGH (ref 65–99)
Potassium: 4.2 mmol/L (ref 3.5–5.2)
Protein, total: 6.6 g/dL (ref 6.0–8.5)
Sodium: 141 mmol/L (ref 134–144)

## 2018-07-30 LAB — MAGNESIUM
Magnesium: 2.1 mg/dL (ref 1.6–2.3)
Magnesium: 2.1 mg/dL (ref 1.6–2.3)

## 2018-07-30 LAB — CBC WITH AUTO DIFFERENTIAL
Basophils %: 1 %
Basophils Absolute: 0 10*3/uL (ref 0.0–0.2)
Eosinophils %: 3 %
Eosinophils Absolute: 0.1 10*3/uL (ref 0.0–0.4)
Granulocyte Absolute Count: 0 10*3/uL (ref 0.0–0.1)
Hematocrit: 43.3 % (ref 34.0–46.6)
Hemoglobin: 14.2 g/dL (ref 11.1–15.9)
Immature Granulocytes: 0 %
Lymphocytes %: 55 %
Lymphocytes Absolute: 2.4 10*3/uL (ref 0.7–3.1)
MCH: 30.2 pg (ref 26.6–33.0)
MCHC: 32.8 g/dL (ref 31.5–35.7)
MCV: 92 fL (ref 79–97)
Monocytes %: 7 %
Monocytes Absolute: 0.3 10*3/uL (ref 0.1–0.9)
Neutrophils %: 34 %
Neutrophils Absolute: 1.5 10*3/uL (ref 1.4–7.0)
Platelets: 213 10*3/uL (ref 150–450)
RBC: 4.7 x10E6/uL (ref 3.77–5.28)
RDW: 12.7 % (ref 11.7–15.4)
WBC: 4.4 10*3/uL (ref 3.4–10.8)

## 2018-07-30 LAB — COMPREHENSIVE METABOLIC PANEL
ALT: 23 IU/L (ref 0–32)
AST: 21 IU/L (ref 0–40)
Albumin/Globulin Ratio: 2.1 NA (ref 1.2–2.2)
Albumin: 4.5 g/dL (ref 3.8–4.8)
Alkaline Phosphatase: 76 IU/L (ref 39–117)
BUN: 12 mg/dL (ref 8–27)
Bun/Cre Ratio: 16 NA (ref 12–28)
CO2: 22 mmol/L (ref 20–29)
Calcium: 9.6 mg/dL (ref 8.7–10.3)
Chloride: 105 mmol/L (ref 96–106)
Creatinine: 0.75 mg/dL (ref 0.57–1.00)
EGFR IF NonAfrican American: 86 mL/min/{1.73_m2} (ref >59)
GFR African American: 99 mL/min/{1.73_m2} (ref >59)
Globulin, Total: 2.1 g/dL (ref 1.5–4.5)
Glucose: 100 mg/dL — ABNORMAL HIGH (ref 65–99)
Potassium: 4.2 mmol/L (ref 3.5–5.2)
Sodium: 141 mmol/L (ref 134–144)
Total Bilirubin: 0.3 mg/dL (ref 0.0–1.2)
Total Protein: 6.6 g/dL (ref 6.0–8.5)

## 2018-07-30 LAB — TSH + FREE T4 PANEL
T4 Free: 1.09 ng/dL (ref 0.82–1.77)
TSH: 2.86 u[IU]/mL (ref 0.450–4.500)

## 2018-08-08 ENCOUNTER — Inpatient Hospital Stay: Admit: 2018-08-08 | Payer: PRIVATE HEALTH INSURANCE | Attending: Nurse Practitioner | Primary: Nurse Practitioner

## 2018-08-08 DIAGNOSIS — Z1231 Encounter for screening mammogram for malignant neoplasm of breast: Secondary | ICD-10-CM

## 2018-08-08 NOTE — Progress Notes (Signed)
Patient notified and verbalized understanding. 

## 2018-08-08 NOTE — Progress Notes (Signed)
NO SPECIFIC MAMMOGRAPHIC EVIDENCE FOR MALIGNANCY.  FOLLOW UP BILATERAL SCREENING  MAMMOGRAPHY IS RECOMMENDED IN ONE YEAR.    ??

## 2018-08-08 NOTE — Progress Notes (Signed)
Patient notified and verbalized understanding

## 2018-08-13 ENCOUNTER — Ambulatory Visit: Attending: Nurse Practitioner | Primary: Nurse Practitioner

## 2018-08-13 ENCOUNTER — Ambulatory Visit
Admit: 2018-08-13 | Discharge: 2018-08-13 | Payer: PRIVATE HEALTH INSURANCE | Attending: Nurse Practitioner | Primary: Nurse Practitioner

## 2018-08-13 DIAGNOSIS — R000 Tachycardia, unspecified: Secondary | ICD-10-CM

## 2018-08-13 MED ORDER — CYCLOBENZAPRINE 10 MG TAB
10 mg | ORAL_TABLET | Freq: Every evening | ORAL | 2 refills | Status: DC
Start: 2018-08-13 — End: 2018-10-21

## 2018-08-13 NOTE — Progress Notes (Signed)
Progress Notes by Erskine Squibbothran-Pate, Catharine Kettlewell L, NP at 08/13/18 1620                Author: Erskine Squibbothran-Pate, Kc Sedlak L, NP  Service: --  Author Type: Nurse Practitioner       Filed: 08/13/18 1753  Encounter Date: 08/13/2018  Status: Signed          Editor: Erskine Squibbothran-Pate, Tylin Force L, NP (Nurse Practitioner)                                  [facility]   [id number]   [place], [place] [address]    (ph) [phone] (fax) [phone]   Sekai Gitlin L. Cothran-Pate APRN, FNP-C            63 year old female comes into the office for a face-to-face encounter to get Adipex.  She had a virtual visit a  couple weeks ago and  all of her lab work has been done. Everything checked out okay.  She does have a body mass index of 33 and her TSH was normal.  She has been on Adipex in the past and is requesting Adipex.  She also needs her Flexeril sent in because  it was not done after the virtual appointment.  She takes Flexeril for muscle spasms at night.  She denies any new issues today.            Allergies        Allergen  Reactions         ?  Lipitor [Atorvastatin]  Other (comments)             Muscle pain         ?  Lortab [Hydrocodone-Acetaminophen]  Itching         ?  Penicillins  Hives             Past Medical History:        Diagnosis  Date         ?  Arthritis       ?  Hypertension       ?  Thyroid disease            hypothyroid             Family History         Problem  Relation  Age of Onset          ?  Lung Disease  Mother       ?  Asthma  Mother       ?  Liver Disease  Father       ?  Stroke  Brother            ?  Diabetes  Brother               Social History          Socioeconomic History         ?  Marital status:  DIVORCED              Spouse name:  Not on file         ?  Number of children:  Not on file     ?  Years of education:  Not on file     ?  Highest education level:  Not on file       Occupational History        ?  Not on file       Social Needs         ?  Financial resource strain:   Not on file        ?  Food insecurity              Worry:  Not on file         Inability:  Not on file        ?  Transportation needs              Medical:  Not on file         Non-medical:  Not on file       Tobacco Use         ?  Smoking status:  Former Smoker              Packs/day:  1.00         Years:  15.00         Pack years:  15.00         Last attempt to quit:  10/25/1996         Years since quitting:  21.8         ?  Smokeless tobacco:  Never Used       Substance and Sexual Activity         ?  Alcohol use:  No     ?  Drug use:  No     ?  Sexual activity:  Not on file       Lifestyle        ?  Physical activity              Days per week:  Not on file         Minutes per session:  Not on file         ?  Stress:  Not on file       Relationships        ?  Social Engineer, manufacturing systemsconnections              Talks on phone:  Not on file         Gets together:  Not on file         Attends religious service:  Not on file         Active member of club or organization:  Not on file         Attends meetings of clubs or organizations:  Not on file         Relationship status:  Not on file        ?  Intimate partner violence              Fear of current or ex partner:  Not on file         Emotionally abused:  Not on file         Physically abused:  Not on file         Forced sexual activity:  Not on file        Other Topics  Concern        ?  Not on file       Social History Narrative        ?  Not on file             OB History                Gravida  4                Para      4                Term                       Preterm                       AB                       Living                                    SAB                       TAB                       Ectopic                       Molar                       Multiple                       Live Births                                             Past Surgical History:         Procedure  Laterality  Date          ?  HX APPENDECTOMY         ?  HX CERVICAL DISKECTOMY     09/16/06          Anterior C5-C6 diskectomy with decompression;  anterior cervical arthrodesis interbody technique;  anterior cervical instrumentation;  tricortical  iliac crest bone graft harvested through a separate incision.           ?  HX CHOLECYSTECTOMY              ?  HX TAH AND BSO                 Health Maintenance        Topic  Date Due         ?  Hepatitis C Screening   [birth year]     ?  Breast Cancer Screen Mammogram   12/24/2006     ?  FOBT Q1Y Age 62-75   12/24/2006     ?  Medicare Yearly Exam   07/23/2018     ?  PAP AKA CERVICAL CYTOLOGY   09/08/2018 (Originally 12/23/1977)     ?  Shingrix Vaccine Age 41> (1 of 2)  11/18/2018 (Originally 12/24/2006)     ?  Influenza Age 49 to Adult   09/26/2018     ?  DTaP/Tdap/Td series (2 - Td)  09/26/2022     ?  Lipid Screen   07/29/2023     ?  Bone Densitometry (Dexa) Screening   Completed         ?  Pneumococcal 0-64 years  Aged Out              Current Outpatient Medications:    ?  cyclobenzaprine (Flexeril) 10 mg tablet, Take 1 Tab by mouth nightly., Disp: 30 Tab, Rfl: 2   ?  levothyroxine (SYNTHROID) 75 mcg tablet, Take 1 Tab by mouth Daily (before breakfast)., Disp: 30 Tab, Rfl: 1   ?  lisinopriL (PRINIVIL, ZESTRIL) 20 mg tablet, Take 0.5 Tabs by mouth daily., Disp: 30 Tab, Rfl: 1      Review of Systems    Constitutional: Negative.     HENT: Negative.     Eyes: Negative.     Respiratory: Negative.     Cardiovascular: Negative.          Tachycardia    Gastrointestinal: Negative.     Genitourinary: Negative.     Musculoskeletal:         Muscle spasms    Skin: Negative.     Neurological: Negative.     Endo/Heme/Allergies: Negative.     Psychiatric/Behavioral: Negative.     All other systems reviewed and are negative.         Visit Vitals      BP  124/80     Pulse  (!) 104     Temp  98.2 ??F (36.8 ??C)     Ht  5' (1.524 m)     Wt  170 lb (77.1 kg)     SpO2  96%        BMI  33.20 kg/m??            Physical Exam   Constitutional :        Appearance: She is  well-developed.   HENT :       Head: Normocephalic.   Eyes :       Pupils: Pupils are equal, round, and reactive to light.   Neck :       Musculoskeletal: Normal range of motion.   Cardiovascular :       Rate and Rhythm: Regular rhythm. Tachycardia present.      Heart sounds: No murmur. No friction rub. No gallop.    Pulmonary:       Effort: Pulmonary effort is normal.      Breath sounds: Normal breath sounds.   Abdominal :      General: Bowel sounds are normal.      Palpations: Abdomen is soft.     Musculoskeletal: Normal range of motion.    Skin:      General: Skin is warm and dry.   Neurological :       Mental Status: She is alert and oriented to person, place, and time.         EKG reveals Sinus Tachycardia   -Left atrial enlargement   Anteroseptal infarct-age undetermined   Rate=103, PR=180, QT=348            Assessment & Plan:   1. Tachycardia   - AMB POC EKG ROUTINE W/ 12 LEADS, INTER & REP   - REFERRAL TO CARDIOLOGY      2. Left atrial enlargement   - REFERRAL TO CARDIOLOGY      3. Class 1 obesity due to excess calories without serious comorbidity with body mass index (BMI) of 33.0 to 33.9 in adult   Explained to patient that I cannot give her Adipex while she is having palpitations.  Will refer her to cardiology for cardiac evaluation.  We will readdress weight loss meds  at another time.    However, diet encouraged.       4. Night muscle spasms   - cyclobenzaprine (Flexeril) 10 mg tablet; Take 1 Tab by mouth nightly.  Dispense: 30 Tab; Refill: 2      25  minutes face to face time spent with the patient; Greater than 50% counseling and/or coordination of care: the treatment regimen is extensive; detailed review.  If she has not heard from cardiology within 2 to 3 days, she will let us know.  This note  was dictated using dragon voice recognition software.  It has been proofread, but there may still exist voice recognition errors that the author did not detect.            Signed By:  Dorthula Matas, NP            August 13, 2018

## 2018-08-13 NOTE — Progress Notes (Signed)
[facility]  [address]  [place], [place] [address]   (ph) [phone] (fax) [phone]  Katricia Prehn L. Cothran-Pate APRN, FNP-C        63 year old female comes into the office for a face-to-face encounter to get Adipex.  She had a virtual visit a couple weeks ago and  all of her lab work has been done. Everything checked out okay.  She does have a body mass index of 33 and her TSH was normal.  She has been on Adipex in the past and is requesting Adipex.  She also needs her Flexeril sent in because it was not done after the virtual appointment.  She takes Flexeril for muscle spasms at night.  She denies any new issues today.       Allergies   Allergen Reactions   ??? Lipitor [Atorvastatin] Other (comments)     Muscle pain   ??? Lortab [Hydrocodone-Acetaminophen] Itching   ??? Penicillins Hives       Past Medical History:   Diagnosis Date   ??? Arthritis    ??? Hypertension    ??? Thyroid disease     hypothyroid       Family History   Problem Relation Age of Onset   ??? Lung Disease Mother    ??? Asthma Mother    ??? Liver Disease Father    ??? Stroke Brother    ??? Diabetes Brother        Social History     Socioeconomic History   ??? Marital status: DIVORCED     Spouse name: Not on file   ??? Number of children: Not on file   ??? Years of education: Not on file   ??? Highest education level: Not on file   Occupational History   ??? Not on file   Social Needs   ??? Financial resource strain: Not on file   ??? Food insecurity     Worry: Not on file     Inability: Not on file   ??? Transportation needs     Medical: Not on file     Non-medical: Not on file   Tobacco Use   ??? Smoking status: Former Smoker     Packs/day: 1.00     Years: 15.00     Pack years: 15.00     Last attempt to quit: 10/25/1996     Years since quitting: 21.8   ??? Smokeless tobacco: Never Used   Substance and Sexual Activity   ??? Alcohol use: No   ??? Drug use: No   ??? Sexual activity: Not on file   Lifestyle   ??? Physical activity      Days per week: Not on file     Minutes per session: Not on file   ??? Stress: Not on file   Relationships   ??? Social Wellsite geologistconnections     Talks on phone: Not on file     Gets together: Not on file     Attends religious service: Not on file     Active member of club or organization: Not on file     Attends meetings of clubs or organizations: Not on file     Relationship status: Not on file   ??? Intimate partner violence     Fear of current or ex partner: Not on file     Emotionally abused: Not on file     Physically abused: Not on file     Forced sexual  activity: Not on file   Other Topics Concern   ??? Not on file   Social History Narrative   ??? Not on file       OB History     Gravida   4    Para   4    Term        Preterm        AB        Living           SAB        TAB        Ectopic        Molar        Multiple        Live Births                    Past Surgical History:   Procedure Laterality Date   ??? HX APPENDECTOMY     ??? HX CERVICAL DISKECTOMY  09/16/06    Anterior C5-C6 diskectomy with decompression;  anterior cervical arthrodesis interbody technique;  anterior cervical instrumentation;  tricortical iliac crest bone graft harvested through a separate incision.    ??? HX CHOLECYSTECTOMY     ??? HX TAH AND BSO         Health Maintenance   Topic Date Due   ??? Hepatitis C Screening  1955-11-23   ??? Breast Cancer Screen Mammogram  12/24/2006   ??? FOBT Q1Y Age 13-75  12/24/2006   ??? Medicare Yearly Exam  07/23/2018   ??? PAP AKA CERVICAL CYTOLOGY  09/08/2018 (Originally 12/23/1977)   ??? Shingrix Vaccine Age 13> (1 of 2) 11/18/2018 (Originally 12/24/2006)   ??? Influenza Age 59 to Adult  09/26/2018   ??? DTaP/Tdap/Td series (2 - Td) 09/26/2022   ??? Lipid Screen  07/29/2023   ??? Bone Densitometry (Dexa) Screening  Completed   ??? Pneumococcal 0-64 years  Aged Out         Current Outpatient Medications:   ???  cyclobenzaprine (Flexeril) 10 mg tablet, Take 1 Tab by mouth nightly., Disp: 30 Tab, Rfl: 2   ???  levothyroxine (SYNTHROID) 75 mcg tablet, Take 1 Tab by mouth Daily (before breakfast)., Disp: 30 Tab, Rfl: 1  ???  lisinopriL (PRINIVIL, ZESTRIL) 20 mg tablet, Take 0.5 Tabs by mouth daily., Disp: 30 Tab, Rfl: 1    Review of Systems   Constitutional: Negative.    HENT: Negative.    Eyes: Negative.    Respiratory: Negative.    Cardiovascular: Negative.         Tachycardia   Gastrointestinal: Negative.    Genitourinary: Negative.    Musculoskeletal:        Muscle spasms   Skin: Negative.    Neurological: Negative.    Endo/Heme/Allergies: Negative.    Psychiatric/Behavioral: Negative.    All other systems reviewed and are negative.      Visit Vitals  BP 124/80   Pulse (!) 104   Temp 98.2 ??F (36.8 ??C)   Ht 5' (1.524 m)   Wt 170 lb (77.1 kg)   SpO2 96%   BMI 33.20 kg/m??        Physical Exam  Constitutional:       Appearance: She is well-developed.   HENT:      Head: Normocephalic.   Eyes:      Pupils: Pupils are equal, round, and reactive to light.   Neck:      Musculoskeletal: Normal range of motion.  Cardiovascular:      Rate and Rhythm: Regular rhythm. Tachycardia present.      Heart sounds: No murmur. No friction rub. No gallop.    Pulmonary:      Effort: Pulmonary effort is normal.      Breath sounds: Normal breath sounds.   Abdominal:      General: Bowel sounds are normal.      Palpations: Abdomen is soft.   Musculoskeletal: Normal range of motion.   Skin:     General: Skin is warm and dry.   Neurological:      Mental Status: She is alert and oriented to person, place, and time.       EKG reveals Sinus Tachycardia  -Left atrial enlargement  Anteroseptal infarct-age undetermined  Rate=103, PR=180, QT=348        Assessment & Plan:  1. Tachycardia  - AMB POC EKG ROUTINE W/ 12 LEADS, INTER & REP  - REFERRAL TO CARDIOLOGY    2. Left atrial enlargement  - REFERRAL TO CARDIOLOGY    3. Class 1 obesity due to excess calories without serious comorbidity with body mass index (BMI) of 33.0 to 33.9 in adult   Explained to patient that I cannot give her Adipex while she is having palpitations.  Will refer her to cardiology for cardiac evaluation.  We will readdress weight loss meds at another time.   However, diet encouraged.     4. Night muscle spasms  - cyclobenzaprine (Flexeril) 10 mg tablet; Take 1 Tab by mouth nightly.  Dispense: 30 Tab; Refill: 2    25  minutes face to face time spent with the patient; Greater than 50% counseling and/or coordination of care: the treatment regimen is extensive; detailed review.  If she has not heard from cardiology within 2 to 3 days, she will let us know.  This note was dictated using dragon voice recognition software.  It has been proofread, but there may still exist voice recognition errors that the author did not detect.      Signed By: Dorthula Matas, NP     August 13, 2018

## 2018-08-19 ENCOUNTER — Ambulatory Visit: Attending: Cardiovascular Disease | Primary: Nurse Practitioner

## 2018-08-19 ENCOUNTER — Ambulatory Visit
Admit: 2018-08-19 | Discharge: 2018-08-20 | Payer: MEDICARE | Attending: Cardiovascular Disease | Primary: Nurse Practitioner

## 2018-08-19 DIAGNOSIS — E039 Hypothyroidism, unspecified: Secondary | ICD-10-CM

## 2018-08-19 MED ORDER — METOPROLOL SUCCINATE SR 50 MG 24 HR TAB
50 mg | ORAL_TABLET | Freq: Every day | ORAL | 3 refills | Status: DC
Start: 2018-08-19 — End: 2018-11-03

## 2018-08-19 NOTE — Progress Notes (Signed)
[place]  [address], [address]  [place] [address]  PHONE: [phone]    SUBJECTIVE: Sandra Harmon  Sandra Harmon (1956-02-13) is a 63 y.o. female seen for a follow up visit regarding the following:        ICD-10-CM ICD-9-CM   1. Hypothyroidism, unspecified type E03.9 244.9   2. Essential hypertension I10 401.9   3. Tachycardia R00.0 785.0     Originally seen in 2007 for symptoms. Had normal echo and normal cath. Symptoms were gallbladder related. Now returns with sinus tachy. She is aware that she has this sinus tachy.  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    Outpatient Medications Marked as Taking for the 08/19/18 encounter (Office Visit) with Jarl Sellitto, Orland Penman, MD   Medication Sig Dispense Refill   ??? aspirin 81 mg chewable tablet Take  by mouth daily.     ??? multivitamin, tx-iron-ca-min (THERA-M w/ IRON) 9 mg iron-400 mcg tab tablet Take 1 Tab by mouth daily.     ??? metoprolol succinate (TOPROL-XL) 50 mg XL tablet Take 1 Tab by mouth daily. 90 Tab 3   ??? cyclobenzaprine (Flexeril) 10 mg tablet Take 1 Tab by mouth nightly. 30 Tab 2   ??? levothyroxine (SYNTHROID) 75 mcg tablet Take 1 Tab by mouth Daily (before breakfast). 30 Tab 1   ??? lisinopriL (PRINIVIL, ZESTRIL) 20 mg tablet Take 0.5 Tabs by mouth daily. 30 Tab 1     Allergies   Allergen Reactions   ??? Lipitor [Atorvastatin] Other (comments)     Muscle pain   ??? Lortab [Hydrocodone-Acetaminophen] Itching   ??? Penicillins Hives     Past Medical History:   Diagnosis Date   ??? Arthritis    ??? Hypertension    ??? Thyroid disease     hypothyroid     Past Surgical History:   Procedure Laterality Date   ??? HX APPENDECTOMY     ??? HX CERVICAL DISKECTOMY  09/16/06    Anterior C5-C6 diskectomy with decompression;  anterior cervical arthrodesis interbody  technique;  anterior cervical instrumentation;  tricortical iliac crest bone graft harvested through a separate incision.    ??? HX CHOLECYSTECTOMY     ??? HX TAH AND BSO       Family History   Problem Relation Age of Onset   ??? Lung Disease Mother    ??? Asthma Mother    ??? Liver Disease Father    ??? Stroke Brother    ??? Diabetes Brother       Social History     Tobacco Use   ??? Smoking status: Former Smoker     Packs/day: 1.00     Years: 15.00     Pack years: 15.00     Last attempt to quit: 10/25/1996     Years since quitting: 21.8   ??? Smokeless tobacco: Never Used   Substance Use Topics   ??? Alcohol use: No     Pt does   have history of early onset cad or heart failure in immediate family members    Current Outpatient Medications:   ???  aspirin 81 mg chewable tablet, Take  by mouth daily., Disp: , Rfl:   ???  multivitamin, tx-iron-ca-min (THERA-M w/ IRON) 9 mg iron-400 mcg tab tablet, Take 1  Tab by mouth daily., Disp: , Rfl:   ???  metoprolol succinate (TOPROL-XL) 50 mg XL tablet, Take 1 Tab by mouth daily., Disp: 90 Tab, Rfl: 3  ???  cyclobenzaprine (Flexeril) 10 mg tablet, Take 1 Tab by mouth nightly., Disp: 30 Tab, Rfl: 2  ???  levothyroxine (SYNTHROID) 75 mcg tablet, Take 1 Tab by mouth Daily (before breakfast)., Disp: 30 Tab, Rfl: 1  ???  lisinopriL (PRINIVIL, ZESTRIL) 20 mg tablet, Take 0.5 Tabs by mouth daily., Disp: 30 Tab, Rfl: 1        ROS:  Review of Systems - General ROS: negative for - chills, fatigue or fever  Hematological and Lymphatic ROS: negative for - bleeding problems, bruising or jaundice  Respiratory ROS: no cough, shortness of breath, or wheezing  Cardiovascular ROS: no chest pain or dyspnea on exertion  Gastrointestinal ROS: no abdominal pain, change in bowel habits, or black or bloody stools  Neurological ROS: no TIA or stroke symptoms  All other systems negative.      Wt Readings from Last 3 Encounters:   08/19/18 168 lb 9.6 oz (76.5 kg)   08/13/18 170 lb (77.1 kg)   07/23/18 170 lb (77.1 kg)     Temp Readings  from Last 3 Encounters:   08/13/18 98.2 ??F (36.8 ??C)   10/02/15 98.2 ??F (36.8 ??C)   06/01/11 97.7 ??F (36.5 ??C)     BP Readings from Last 3 Encounters:   08/19/18 124/88   08/13/18 124/80   07/29/18 132/78     Pulse Readings from Last 3 Encounters:   08/19/18 (!) 104   08/13/18 (!) 104   07/29/18 95           PHYSICAL EXAM:  Visit Vitals  BP 124/88   Pulse (!) 104   Ht 5' (1.524 m)   Wt 168 lb 9.6 oz (76.5 kg)   BMI 32.93 kg/m??       Physical Examination: General appearance - alert, well appearing, and in no distress  Mental status - alert, oriented to person, place, and time  Eyes - pupils equal and reactive, extraocular eye movements intact  Neck/lymph - supple, no significant adenopathy  Chest/lungs - clear to auscultation, no wheezes, rales or rhonchi, symmetric air entry  Heart/CV - normal rate, regular rhythm, normal S1, S2, no murmurs, rubs, clicks or gallops  Abdomen/GI - soft, nontender, nondistended, no masses or organomegaly  Musculoskeletal - no joint tenderness, deformity or swelling  Extremities - peripheral pulses normal, no pedal edema, no clubbing or cyanosis  Skin - normal coloration and turgor, no rashes, no suspicious skin lesions noted    EKG: normal EKG, normal sinus rhythm, unchanged from previous tracings.                   Medications reviewed and questions answered    Recent Results (from the past 672 hour(s))   MAGNESIUM    Collection Time: 07/29/18  8:10 AM   Result Value Ref Range    Magnesium 2.1 1.6 - 2.3 mg/dL   METABOLIC PANEL, COMPREHENSIVE    Collection Time: 07/29/18  8:10 AM   Result Value Ref Range    Glucose 100 (H) 65 - 99 mg/dL    BUN 12 8 - 27 mg/dL    Creatinine 0.75 0.57 - 1.00 mg/dL    GFR est non-AA 86 >59 mL/min/1.73    GFR est AA 99 >59 mL/min/1.73    BUN/Creatinine ratio 16 12 - 28  Sodium 141 134 - 144 mmol/L    Potassium 4.2 3.5 - 5.2 mmol/L    Chloride 105 96 - 106 mmol/L    CO2 22 20 - 29 mmol/L    Calcium 9.6 8.7 - 10.3 mg/dL    Protein, total 6.6 6.0 - 8.5 g/dL     Albumin 4.5 3.8 - 4.8 g/dL    GLOBULIN, TOTAL 2.1 1.5 - 4.5 g/dL    A-G Ratio 2.1 1.2 - 2.2    Bilirubin, total 0.3 0.0 - 1.2 mg/dL    Alk. phosphatase 76 39 - 117 IU/L    AST (SGOT) 21 0 - 40 IU/L    ALT (SGPT) 23 0 - 32 IU/L   TSH AND FREE T4    Collection Time: 07/29/18  8:10 AM   Result Value Ref Range    TSH 2.860 0.450 - 4.500 uIU/mL    T4, Free 1.09 0.82 - 1.77 ng/dL   LIPID PANEL WITH LDL/HDL RATIO    Collection Time: 07/29/18  8:10 AM   Result Value Ref Range    Cholesterol, total 175 100 - 199 mg/dL    Triglyceride 149 0 - 149 mg/dL    HDL Cholesterol 59 >39 mg/dL    VLDL, calculated 30 5 - 40 mg/dL    LDL, calculated 86 0 - 99 mg/dL    LDL/HDL Ratio 1.5 0.0 - 3.2 ratio   CBC WITH AUTOMATED DIFF    Collection Time: 07/29/18  8:10 AM   Result Value Ref Range    WBC 4.4 3.4 - 10.8 x10E3/uL    RBC 4.70 3.77 - 5.28 x10E6/uL    HGB 14.2 11.1 - 15.9 g/dL    HCT 43.3 34.0 - 46.6 %    MCV 92 79 - 97 fL    MCH 30.2 26.6 - 33.0 pg    MCHC 32.8 31.5 - 35.7 g/dL    RDW 12.7 11.7 - 15.4 %    PLATELET 213 150 - 450 x10E3/uL    NEUTROPHILS 34 Not Estab. %    Lymphocytes 55 Not Estab. %    MONOCYTES 7 Not Estab. %    EOSINOPHILS 3 Not Estab. %    BASOPHILS 1 Not Estab. %    ABS. NEUTROPHILS 1.5 1.4 - 7.0 x10E3/uL    Abs Lymphocytes 2.4 0.7 - 3.1 x10E3/uL    ABS. MONOCYTES 0.3 0.1 - 0.9 x10E3/uL    ABS. EOSINOPHILS 0.1 0.0 - 0.4 x10E3/uL    ABS. BASOPHILS 0.0 0.0 - 0.2 x10E3/uL    IMMATURE GRANULOCYTES 0 Not Estab. %    ABS. IMM. GRANS. 0.0 0.0 - 0.1 x10E3/uL     Lab Results   Component Value Date/Time    Cholesterol, total 175 07/29/2018 08:10 AM    HDL Cholesterol 59 07/29/2018 08:10 AM    LDL, calculated 86 07/29/2018 08:10 AM    VLDL, calculated 30 07/29/2018 08:10 AM    Triglyceride 149 07/29/2018 08:10 AM         ASSESSMENT and PLAN        1. Hypothyroidism, unspecified type  Labs are good    2. Essential hypertension  controlled    3. Tachycardia  Autonomic dysfunction and inappropriate sinus tach. Repeat echo  and start toprol xl 50 qd          Orders Placed This Encounter   ??? ECHO COMPLETE STUDY     Standing Status:   Future     Standing Expiration Date:   02/17/2019  Order Specific Question:   Reason for Exam:     Answer:   dyspnea   ??? aspirin 81 mg chewable tablet     Sig: Take  by mouth daily.   ??? multivitamin, tx-iron-ca-min (THERA-M w/ IRON) 9 mg iron-400 mcg tab tablet     Sig: Take 1 Tab by mouth daily.   ??? metoprolol succinate (TOPROL-XL) 50 mg XL tablet     Sig: Take 1 Tab by mouth daily.     Dispense:  90 Tab     Refill:  3                 Jens Som, MD  08/19/2018  3:41 PM

## 2018-08-19 NOTE — Progress Notes (Signed)
[place], [place]  [address] [place] , [address]  [place] [address]  PHONE: [phone]    SUBJECTIVE: Sandra Harmon  Sandra Harmon (Jan 20, 1956) is a 63 y.o. female seen for a follow up visit regarding the following:        ICD-10-CM ICD-9-CM   1. Hypothyroidism, unspecified type E03.9 244.9   2. Essential hypertension I10 401.9   3. Tachycardia R00.0 785.0     Originally seen in 2007 for symptoms. Had normal echo and normal cath. Symptoms were gallbladder related. Now returns with sinus tachy. She is aware that she has this sinus tachy.  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    Outpatient Medications Marked as Taking for the 08/19/18 encounter (Office Visit) with Halston Fairclough, Orland Penman, MD   Medication Sig Dispense Refill   ??? aspirin 81 mg chewable tablet Take  by mouth daily.     ??? multivitamin, tx-iron-ca-min (THERA-M w/ IRON) 9 mg iron-400 mcg tab tablet Take 1 Tab by mouth daily.     ??? metoprolol succinate (TOPROL-XL) 50 mg XL tablet Take 1 Tab by mouth daily. 90 Tab 3   ??? cyclobenzaprine (Flexeril) 10 mg tablet Take 1 Tab by mouth nightly. 30 Tab 2   ??? levothyroxine (SYNTHROID) 75 mcg tablet Take 1 Tab by mouth Daily (before breakfast). 30 Tab 1   ??? lisinopriL (PRINIVIL, ZESTRIL) 20 mg tablet Take 0.5 Tabs by mouth daily. 30 Tab 1     Allergies   Allergen Reactions   ??? Lipitor [Atorvastatin] Other (comments)     Muscle pain   ??? Lortab [Hydrocodone-Acetaminophen] Itching   ??? Penicillins Hives     Past Medical History:   Diagnosis Date   ??? Arthritis    ??? Hypertension    ??? Thyroid disease     hypothyroid     Past Surgical History:   Procedure Laterality Date   ??? HX APPENDECTOMY     ??? HX CERVICAL DISKECTOMY  09/16/06    Anterior C5-C6 diskectomy with decompression;  anterior cervical  arthrodesis interbody technique;  anterior cervical instrumentation;  tricortical iliac crest bone graft harvested through a separate incision.    ??? HX CHOLECYSTECTOMY     ??? HX TAH AND BSO       Family History   Problem Relation Age of Onset   ??? Lung Disease Mother    ??? Asthma Mother    ??? Liver Disease Father    ??? Stroke Brother    ??? Diabetes Brother       Social History     Tobacco Use   ??? Smoking status: Former Smoker     Packs/day: 1.00     Years: 15.00     Pack years: 15.00     Last attempt to quit: 10/25/1996     Years since quitting: 21.8   ??? Smokeless tobacco: Never Used   Substance Use Topics   ??? Alcohol use: No     Pt does   have history of early onset cad or heart failure in immediate family members    Current Outpatient Medications:   ???  aspirin 81 mg chewable tablet, Take  by mouth daily., Disp: , Rfl:   ???  multivitamin, tx-iron-ca-min (THERA-M w/ IRON) 9 mg iron-400 mcg tab tablet, Take 1  Tab by mouth daily., Disp: , Rfl:   ???  metoprolol succinate (TOPROL-XL) 50 mg XL tablet, Take 1 Tab by mouth daily., Disp: 90 Tab, Rfl: 3  ???  cyclobenzaprine (Flexeril) 10 mg tablet, Take 1 Tab by mouth nightly., Disp: 30 Tab, Rfl: 2  ???  levothyroxine (SYNTHROID) 75 mcg tablet, Take 1 Tab by mouth Daily (before breakfast)., Disp: 30 Tab, Rfl: 1  ???  lisinopriL (PRINIVIL, ZESTRIL) 20 mg tablet, Take 0.5 Tabs by mouth daily., Disp: 30 Tab, Rfl: 1        ROS:  Review of Systems - General ROS: negative for - chills, fatigue or fever  Hematological and Lymphatic ROS: negative for - bleeding problems, bruising or jaundice  Respiratory ROS: no cough, shortness of breath, or wheezing  Cardiovascular ROS: no chest pain or dyspnea on exertion  Gastrointestinal ROS: no abdominal pain, change in bowel habits, or black or bloody stools  Neurological ROS: no TIA or stroke symptoms  All other systems negative.      Wt Readings from Last 3 Encounters:   08/19/18 168 lb 9.6 oz (76.5 kg)   08/13/18 170 lb (77.1 kg)    07/23/18 170 lb (77.1 kg)     Temp Readings from Last 3 Encounters:   08/13/18 98.2 ??F (36.8 ??C)   10/02/15 98.2 ??F (36.8 ??C)   06/01/11 97.7 ??F (36.5 ??C)     BP Readings from Last 3 Encounters:   08/19/18 124/88   08/13/18 124/80   07/29/18 132/78     Pulse Readings from Last 3 Encounters:   08/19/18 (!) 104   08/13/18 (!) 104   07/29/18 95           PHYSICAL EXAM:  Visit Vitals  BP 124/88   Pulse (!) 104   Ht 5' (1.524 m)   Wt 168 lb 9.6 oz (76.5 kg)   BMI 32.93 kg/m??       Physical Examination: General appearance - alert, well appearing, and in no distress  Mental status - alert, oriented to person, place, and time  Eyes - pupils equal and reactive, extraocular eye movements intact  Neck/lymph - supple, no significant adenopathy  Chest/lungs - clear to auscultation, no wheezes, rales or rhonchi, symmetric air entry  Heart/CV - normal rate, regular rhythm, normal S1, S2, no murmurs, rubs, clicks or gallops  Abdomen/GI - soft, nontender, nondistended, no masses or organomegaly  Musculoskeletal - no joint tenderness, deformity or swelling  Extremities - peripheral pulses normal, no pedal edema, no clubbing or cyanosis  Skin - normal coloration and turgor, no rashes, no suspicious skin lesions noted    EKG: normal EKG, normal sinus rhythm, unchanged from previous tracings.                   Medications reviewed and questions answered    Recent Results (from the past 672 hour(s))   MAGNESIUM    Collection Time: 07/29/18  8:10 AM   Result Value Ref Range    Magnesium 2.1 1.6 - 2.3 mg/dL   METABOLIC PANEL, COMPREHENSIVE    Collection Time: 07/29/18  8:10 AM   Result Value Ref Range    Glucose 100 (H) 65 - 99 mg/dL    BUN 12 8 - 27 mg/dL    Creatinine 0.75 0.57 - 1.00 mg/dL    GFR est non-AA 86 >59 mL/min/1.73    GFR est AA 99 >59 mL/min/1.73    BUN/Creatinine ratio 16 12 - 28  Sodium 141 134 - 144 mmol/L    Potassium 4.2 3.5 - 5.2 mmol/L    Chloride 105 96 - 106 mmol/L    CO2 22 20 - 29 mmol/L     Calcium 9.6 8.7 - 10.3 mg/dL    Protein, total 6.6 6.0 - 8.5 g/dL    Albumin 4.5 3.8 - 4.8 g/dL    GLOBULIN, TOTAL 2.1 1.5 - 4.5 g/dL    A-G Ratio 2.1 1.2 - 2.2    Bilirubin, total 0.3 0.0 - 1.2 mg/dL    Alk. phosphatase 76 39 - 117 IU/L    AST (SGOT) 21 0 - 40 IU/L    ALT (SGPT) 23 0 - 32 IU/L   TSH AND FREE T4    Collection Time: 07/29/18  8:10 AM   Result Value Ref Range    TSH 2.860 0.450 - 4.500 uIU/mL    T4, Free 1.09 0.82 - 1.77 ng/dL   LIPID PANEL WITH LDL/HDL RATIO    Collection Time: 07/29/18  8:10 AM   Result Value Ref Range    Cholesterol, total 175 100 - 199 mg/dL    Triglyceride 149 0 - 149 mg/dL    HDL Cholesterol 59 >39 mg/dL    VLDL, calculated 30 5 - 40 mg/dL    LDL, calculated 86 0 - 99 mg/dL    LDL/HDL Ratio 1.5 0.0 - 3.2 ratio   CBC WITH AUTOMATED DIFF    Collection Time: 07/29/18  8:10 AM   Result Value Ref Range    WBC 4.4 3.4 - 10.8 x10E3/uL    RBC 4.70 3.77 - 5.28 x10E6/uL    HGB 14.2 11.1 - 15.9 g/dL    HCT 43.3 34.0 - 46.6 %    MCV 92 79 - 97 fL    MCH 30.2 26.6 - 33.0 pg    MCHC 32.8 31.5 - 35.7 g/dL    RDW 12.7 11.7 - 15.4 %    PLATELET 213 150 - 450 x10E3/uL    NEUTROPHILS 34 Not Estab. %    Lymphocytes 55 Not Estab. %    MONOCYTES 7 Not Estab. %    EOSINOPHILS 3 Not Estab. %    BASOPHILS 1 Not Estab. %    ABS. NEUTROPHILS 1.5 1.4 - 7.0 x10E3/uL    Abs Lymphocytes 2.4 0.7 - 3.1 x10E3/uL    ABS. MONOCYTES 0.3 0.1 - 0.9 x10E3/uL    ABS. EOSINOPHILS 0.1 0.0 - 0.4 x10E3/uL    ABS. BASOPHILS 0.0 0.0 - 0.2 x10E3/uL    IMMATURE GRANULOCYTES 0 Not Estab. %    ABS. IMM. GRANS. 0.0 0.0 - 0.1 x10E3/uL     Lab Results   Component Value Date/Time    Cholesterol, total 175 07/29/2018 08:10 AM    HDL Cholesterol 59 07/29/2018 08:10 AM    LDL, calculated 86 07/29/2018 08:10 AM    VLDL, calculated 30 07/29/2018 08:10 AM    Triglyceride 149 07/29/2018 08:10 AM         ASSESSMENT and PLAN        1. Hypothyroidism, unspecified type  Labs are good    2. Essential hypertension  controlled    3. Tachycardia   Autonomic dysfunction and inappropriate sinus tach. Repeat echo and start toprol xl 50 qd          Orders Placed This Encounter   ??? ECHO COMPLETE STUDY     Standing Status:   Future     Standing Expiration Date:   02/17/2019  Order Specific Question:   Reason for Exam:     Answer:   dyspnea   ??? aspirin 81 mg chewable tablet     Sig: Take  by mouth daily.   ??? multivitamin, tx-iron-ca-min (THERA-M w/ IRON) 9 mg iron-400 mcg tab tablet     Sig: Take 1 Tab by mouth daily.   ??? metoprolol succinate (TOPROL-XL) 50 mg XL tablet     Sig: Take 1 Tab by mouth daily.     Dispense:  90 Tab     Refill:  3                 Jens Som, MD  08/19/2018  3:41 PM

## 2018-08-27 ENCOUNTER — Encounter

## 2018-08-27 ENCOUNTER — Institutional Professional Consult (permissible substitution): Admit: 2018-08-27 | Discharge: 2018-08-27 | Payer: MEDICARE | Primary: Nurse Practitioner

## 2018-08-27 DIAGNOSIS — R000 Tachycardia, unspecified: Secondary | ICD-10-CM

## 2018-08-27 NOTE — Progress Notes (Signed)
Echo completed.  See interpretation scanned to the order.

## 2018-09-15 ENCOUNTER — Ambulatory Visit: Attending: Nurse Practitioner | Primary: Nurse Practitioner

## 2018-09-15 ENCOUNTER — Ambulatory Visit
Admit: 2018-09-15 | Discharge: 2018-09-15 | Payer: MEDICARE | Attending: Nurse Practitioner | Primary: Nurse Practitioner

## 2018-09-15 DIAGNOSIS — E669 Obesity, unspecified: Secondary | ICD-10-CM

## 2018-09-15 MED ORDER — SHINGRIX (PF) 50 MCG/0.5 ML INTRAMUSCULAR SUSPENSION, KIT
50 mcg/0.5 mL | Freq: Once | INTRAMUSCULAR | 1 refills | Status: AC
Start: 2018-09-15 — End: 2018-09-15

## 2018-09-15 MED ORDER — NALTREXONE 8 MG-BUPROPION 90 MG TABLET,EXTENDED RELEASE
8-90 mg | ORAL_TABLET | ORAL | 0 refills | Status: DC
Start: 2018-09-15 — End: 2019-01-12

## 2018-09-15 NOTE — Progress Notes (Signed)
Progress Notes by Dorthula Matas, NP at 09/15/18 1620                Author: Dorthula Matas, NP  Service: --  Author Type: Nurse Practitioner       Filed: 09/15/18 1858  Encounter Date: 09/15/2018  Status: Signed          Editor: Dorthula Matas, NP (Nurse Practitioner)                                  [facility]   [address]   [place] [address]    (ph) [phone] (fax) [phone]   Gracieann Stannard L. Cothran-Pate APRN, FNP-C            63 year old female comes into the office today to get a weight management med.   She had a  visit a couple weeks  ago and  all of her lab work has been done. Everything checked out okay.  She does have a body mass index of 33 and her TSH was normal.  She has been on Adipex in the past and is requesting Adipex.  She was found to be tachycardic at her last visit; therefore,  a cardiology consult was placed.  She saw Dr. Octavio Manns on 08/19/18 and was diagnosed with  Tachycardia Autonomic dysfunction and inappropriate sinus tach. He repeated an  echo and started  Toprol xl 50 every day.    ??           Allergies        Allergen  Reactions         ?  Lipitor [Atorvastatin]  Other (comments)             Muscle pain         ?  Lortab [Hydrocodone-Acetaminophen]  Itching         ?  Penicillins  Hives             Past Medical History:        Diagnosis  Date         ?  Arthritis       ?  Hypertension       ?  Thyroid disease            hypothyroid             Family History         Problem  Relation  Age of Onset          ?  Lung Disease  Mother       ?  Asthma  Mother       ?  Liver Disease  Father       ?  Stroke  Brother            ?  Diabetes  Brother               Social History          Socioeconomic History         ?  Marital status:  DIVORCED              Spouse name:  Not on file         ?  Number of children:  Not on file     ?  Years of education:  Not on file     ?  Highest education level:  Not on file        Occupational History        ?  Not on file       Social Needs         ?  Financial resource strain:  Not on file        ?  Food insecurity              Worry:  Not on file         Inability:  Not on file        ?  Transportation needs              Medical:  Not on file         Non-medical:  Not on file       Tobacco Use         ?  Smoking status:  Former Smoker              Packs/day:  1.00         Years:  15.00         Pack years:  15.00         Last attempt to quit:  10/25/1996         Years since quitting:  21.9         ?  Smokeless tobacco:  Never Used       Substance and Sexual Activity         ?  Alcohol use:  No     ?  Drug use:  No     ?  Sexual activity:  Not on file       Lifestyle        ?  Physical activity              Days per week:  Not on file         Minutes per session:  Not on file         ?  Stress:  Not on file       Relationships        ?  Social Engineer, manufacturing systems on phone:  Not on file         Gets together:  Not on file         Attends religious service:  Not on file         Active member of club or organization:  Not on file         Attends meetings of clubs or organizations:  Not on file         Relationship status:  Not on file        ?  Intimate partner violence              Fear of current or ex partner:  Not on file         Emotionally abused:  Not on file         Physically abused:  Not on file         Forced sexual activity:  Not on file        Other Topics  Concern        ?  Not on file       Social History Narrative        ?  Not on file  OB History                Gravida      4                Para      4                Term                       Preterm                       AB                       Living                                    SAB                       TAB                       Ectopic                       Molar                       Multiple                       Live Births                                             Past Surgical History:          Procedure  Laterality  Date          ?  HX APPENDECTOMY         ?  HX CERVICAL DISKECTOMY    09/16/06          Anterior C5-C6 diskectomy with decompression;  anterior cervical arthrodesis interbody technique;  anterior cervical instrumentation;  tricortical  iliac crest bone graft harvested through a separate incision.           ?  HX CHOLECYSTECTOMY              ?  HX TAH AND BSO                 Health Maintenance        Topic  Date Due         ?  Hepatitis C Screening   1955-03-07     ?  PAP AKA CERVICAL CYTOLOGY   12/23/1977     ?  FOBT Q1Y Age 62-75   12/24/2006     ?  Medicare Yearly Exam   07/23/2018     ?  Shingrix Vaccine Age 103> (1 of 2)  11/18/2018 (Originally 12/24/2006)     ?  Influenza Age 169 to Adult   09/26/2018     ?  Breast Cancer Screen Mammogram   08/07/2020     ?  DTaP/Tdap/Td series (2 - Td)  09/26/2022     ?  Lipid Screen   07/29/2023     ?  Bone Densitometry (Dexa) Screening   Completed         ?  Pneumococcal 0-64 years   Aged Out              Current Outpatient Medications:    ?  naltrexone-buPROPion (CONTRAVE) 8-90 mg TbER ER tablet, Week 1 1 tab PO QAM, Week 2 1QAM 1QHS, Week 3 2QAM 1 QHS, Week 4 & beyond 2QAM 2QHS, Disp: 70 Tab, Rfl: 0   ?  varicella-zoster recombinant, PF, (Shingrix, PF,) 50 mcg/0.5 mL susr injection, 0.5 mL by IntraMUSCular route once for 1 dose., Disp: 0.5 mL, Rfl: 1   ?  aspirin 81 mg chewable tablet, Take  by mouth daily., Disp: , Rfl:    ?  multivitamin, tx-iron-ca-min (THERA-M w/ IRON) 9 mg iron-400 mcg tab tablet, Take 1 Tab by mouth daily., Disp: , Rfl:    ?  metoprolol succinate (TOPROL-XL) 50 mg XL tablet, Take 1 Tab by mouth daily., Disp: 90 Tab, Rfl: 3   ?  cyclobenzaprine (Flexeril) 10 mg tablet, Take 1 Tab by mouth nightly., Disp: 30 Tab, Rfl: 2   ?  levothyroxine (SYNTHROID) 75 mcg tablet, Take 1 Tab by mouth Daily (before breakfast)., Disp: 30 Tab, Rfl: 1   ?  lisinopriL (PRINIVIL, ZESTRIL) 20 mg tablet, Take 0.5 Tabs by mouth daily., Disp: 30 Tab,  Rfl: 1      Review of Systems    Constitutional: Negative.     HENT: Negative.     Eyes: Negative.     Respiratory: Negative.     Cardiovascular: Negative.     Gastrointestinal: Negative.     Genitourinary: Negative.     Musculoskeletal: Negative.     Skin: Negative.     Neurological: Negative.     Endo/Heme/Allergies: Negative.     Psychiatric/Behavioral: Negative.     All other systems reviewed and are negative.         Visit Vitals      BP  128/88     Pulse  89     Temp  97.4 ??F (36.3 ??C) (Temporal)     Ht  5' (1.524 m)     Wt  172 lb 12.8 oz (78.4 kg)     SpO2  92%        BMI  33.75 kg/m??            Physical Exam   Constitutional :        Appearance: She is well-developed.   HENT :       Head: Normocephalic.   Cardiovascular :       Rate and Rhythm: Normal rate and regular rhythm.      Heart sounds: No murmur. No friction rub. No gallop.     Pulmonary:       Effort: Pulmonary effort is normal.      Breath sounds: Normal breath sounds.   Musculoskeletal : Normal range of motion.    Skin:      General: Skin is warm and dry.   Neurological :       Mental Status: She is alert and oriented to person, place, and time.            Assessment & Plan:      1. Obesity (BMI 30.0-34.9)   Diet and exercise encouraged.  I did not feel comfortable at all given her Adipex given she has bouts of tachycardia.  Therefore, we will try Contrave and see how she does.  She denies a history of seizures.    - naltrexone-buPROPion (CONTRAVE) 8-90 mg TbER ER tablet; Week 1 1 tab PO QAM, Week 2 1QAM 1QHS, Week 3 2QAM 1 QHS, Week 4 & beyond 2QAM 2QHS  Dispense: 70 Tab; Refill: 0      2. Tachycardia   Continue meds      3. Encounter for immunization   - varicella-zoster recombinant, PF, (Shingrix, PF,) 50 mcg/0.5 mL susr injection; 0.5 mL by IntraMUSCular route once for 1 dose.  Dispense: 0.5 mL; Refill: 1         20 minutes face to face time spent with the patient; Greater than 50% counseling and/or coordination of care: the treatment  regimen is extensive; detailed review.  Recheck in 1 month or sooner for problems.  This note was dictated using dragon voice recognition  software.  It has been proofread, but there may still exist voice recognition errors that the author did not detect.            Signed By:  Erskine SquibbSonya L Cothran-Pate, NP           September 15, 2018

## 2018-09-15 NOTE — Progress Notes (Signed)
[facility]  [address]  [place], [place] [id number]   (ph) [phone] (fax) [phone]  Lenya Sterne L. Cothran-Pate APRN, FNP-C        63 year old female comes into the office today to get a weight management med.   She had a  visit a couple weeks ago and  all of her lab work has been done. Everything checked out okay.  She does have a body mass index of 33 and her TSH was normal.  She has been on Adipex in the past and is requesting Adipex.  She was found to be tachycardic at her last visit; therefore, a cardiology consult was placed.  She saw Dr. 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 He repeated an  echo and started  Toprol xl 50 every day.   ??      Allergies   Allergen Reactions   ??? Lipitor [Atorvastatin] Other (comments)     Muscle pain   ??? Lortab [Hydrocodone-Acetaminophen] Itching   ??? Penicillins Hives       Past Medical History:   Diagnosis Date   ??? Arthritis    ??? Hypertension    ??? Thyroid disease     hypothyroid       Family History   Problem Relation Age of Onset   ??? Lung Disease Mother    ??? Asthma Mother    ??? Liver Disease Father    ??? Stroke Brother    ??? Diabetes Brother        Social History     Socioeconomic History   ??? Marital status: DIVORCED     Spouse name: Not on file   ??? Number of children: Not on file   ??? Years of education: Not on file   ??? Highest education level: Not on file   Occupational History   ??? Not on file   Social Needs   ??? Financial resource strain: Not on file   ??? Food insecurity     Worry: Not on file     Inability: Not on file   ??? Transportation needs     Medical: Not on file     Non-medical: Not on file   Tobacco Use   ??? Smoking status: Former Smoker     Packs/day: 1.00     Years: 15.00     Pack years: 15.00     Last attempt to quit: 10/25/1996     Years since quitting: 21.9   ??? Smokeless tobacco: Never Used   Substance and Sexual Activity   ??? Alcohol use: No    ??? Drug use: No   ??? Sexual activity: Not on file   Lifestyle   ??? Physical activity     Days per week: Not on file     Minutes per session: Not on file   ??? Stress: Not on file   Relationships   ??? Social Wellsite geologistconnections     Talks on phone: Not on file     Gets together: Not on file     Attends religious service: Not on file     Active member of club or organization: Not on file     Attends meetings of clubs or organizations: Not on file     Relationship status: Not on file   ??? Intimate partner violence     Fear of current or ex partner: Not on file     Emotionally  abused: Not on file     Physically abused: Not on file     Forced sexual activity: Not on file   Other Topics Concern   ??? Not on file   Social History Narrative   ??? Not on file       OB History     Gravida   4    Para   4    Term        Preterm        AB        Living           SAB        TAB        Ectopic        Molar        Multiple        Live Births                    Past Surgical History:   Procedure Laterality Date   ??? HX APPENDECTOMY     ??? HX CERVICAL DISKECTOMY  09/16/06    Anterior C5-C6 diskectomy with decompression;  anterior cervical arthrodesis interbody technique;  anterior cervical instrumentation;  tricortical iliac crest bone graft harvested through a separate incision.    ??? HX CHOLECYSTECTOMY     ??? HX TAH AND BSO         Health Maintenance   Topic Date Due   ??? Hepatitis C Screening  10/20/1955   ??? PAP AKA CERVICAL CYTOLOGY  12/23/1977   ??? FOBT Q1Y Age 52-75  12/24/2006   ??? Medicare Yearly Exam  07/23/2018   ??? Shingrix Vaccine Age 52> (1 of 2) 11/18/2018 (Originally 12/24/2006)   ??? Influenza Age 259 to Adult  09/26/2018   ??? Breast Cancer Screen Mammogram  08/07/2020   ??? DTaP/Tdap/Td series (2 - Td) 09/26/2022   ??? Lipid Screen  07/29/2023   ??? Bone Densitometry (Dexa) Screening  Completed   ??? Pneumococcal 0-64 years  Aged Out         Current Outpatient Medications:   ???  naltrexone-buPROPion (CONTRAVE) 8-90 mg TbER ER tablet, Week 1 1 tab PO  QAM, Week 2 1QAM 1QHS, Week 3 2QAM 1 QHS, Week 4 & beyond 2QAM 2QHS, Disp: 70 Tab, Rfl: 0  ???  varicella-zoster recombinant, PF, (Shingrix, PF,) 50 mcg/0.5 mL susr injection, 0.5 mL by IntraMUSCular route once for 1 dose., Disp: 0.5 mL, Rfl: 1  ???  aspirin 81 mg chewable tablet, Take  by mouth daily., Disp: , Rfl:   ???  multivitamin, tx-iron-ca-min (THERA-M w/ IRON) 9 mg iron-400 mcg tab tablet, Take 1 Tab by mouth daily., Disp: , Rfl:   ???  metoprolol succinate (TOPROL-XL) 50 mg XL tablet, Take 1 Tab by mouth daily., Disp: 90 Tab, Rfl: 3  ???  cyclobenzaprine (Flexeril) 10 mg tablet, Take 1 Tab by mouth nightly., Disp: 30 Tab, Rfl: 2  ???  levothyroxine (SYNTHROID) 75 mcg tablet, Take 1 Tab by mouth Daily (before breakfast)., Disp: 30 Tab, Rfl: 1  ???  lisinopriL (PRINIVIL, ZESTRIL) 20 mg tablet, Take 0.5 Tabs by mouth daily., Disp: 30 Tab, Rfl: 1    Review of Systems   Constitutional: Negative.    HENT: Negative.    Eyes: Negative.    Respiratory: Negative.    Cardiovascular: Negative.    Gastrointestinal: Negative.    Genitourinary: Negative.    Musculoskeletal: Negative.    Skin: Negative.  Neurological: Negative.    Endo/Heme/Allergies: Negative.    Psychiatric/Behavioral: Negative.    All other systems reviewed and are negative.      Visit Vitals  BP 128/88   Pulse 89   Temp 97.4 ??F (36.3 ??C) (Temporal)   Ht 5' (1.524 m)   Wt 172 lb 12.8 oz (78.4 kg)   SpO2 92%   BMI 33.75 kg/m??        Physical Exam  Constitutional:       Appearance: She is well-developed.   HENT:      Head: Normocephalic.   Cardiovascular:      Rate and Rhythm: Normal rate and regular rhythm.      Heart sounds: No murmur. No friction rub. No gallop.    Pulmonary:      Effort: Pulmonary effort is normal.      Breath sounds: Normal breath sounds.   Musculoskeletal: Normal range of motion.   Skin:     General: Skin is warm and dry.   Neurological:      Mental Status: She is alert and oriented to person, place, and time.         Assessment & Plan:     1. Obesity (BMI 30.0-34.9)  Diet and exercise encouraged.  I did not feel comfortable at all given her Adipex given she has bouts of tachycardia.  Therefore, we will try Contrave and see how she does.  She denies a history of seizures.   - naltrexone-buPROPion (CONTRAVE) 8-90 mg TbER ER tablet; Week 1 1 tab PO QAM, Week 2 1QAM 1QHS, Week 3 2QAM 1 QHS, Week 4 & beyond 2QAM 2QHS  Dispense: 70 Tab; Refill: 0    2. Tachycardia  Continue meds    3. Encounter for immunization  - varicella-zoster recombinant, PF, (Shingrix, PF,) 50 mcg/0.5 mL susr injection; 0.5 mL by IntraMUSCular route once for 1 dose.  Dispense: 0.5 mL; Refill: 1      20 minutes face to face time spent with the patient; Greater than 50% counseling and/or coordination of care: the treatment regimen is extensive; detailed review.  Recheck in 1 month or sooner for problems.  This note was dictated using dragon voice recognition software.  It has been proofread, but there may still exist voice recognition errors that the author did not detect.      Signed By: Dorthula Matas, NP     September 15, 2018

## 2018-09-29 ENCOUNTER — Encounter: Attending: Cardiovascular Disease | Primary: Nurse Practitioner

## 2018-10-01 ENCOUNTER — Encounter

## 2018-10-01 NOTE — Telephone Encounter (Signed)
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 Stated not having any symptoms but was in close contact with the child. Can you place an order at [facility] so that she can be tested.

## 2018-10-02 ENCOUNTER — Encounter

## 2018-10-02 NOTE — Progress Notes (Signed)
Reviewed results and patient verbalizes understanding.

## 2018-10-02 NOTE — Progress Notes (Signed)
To pt. Covid was negative

## 2018-10-02 NOTE — Progress Notes (Signed)
To pt. Covid was negative

## 2018-10-03 NOTE — Telephone Encounter (Signed)
Patient notified and scheduled

## 2018-10-05 ENCOUNTER — Institutional Professional Consult (permissible substitution): Payer: PRIVATE HEALTH INSURANCE | Primary: Nurse Practitioner

## 2018-10-05 DIAGNOSIS — Z20822 Contact with and (suspected) exposure to covid-19: Secondary | ICD-10-CM

## 2018-10-05 NOTE — Progress Notes (Signed)
Patient presented to the [facility] for COVID testing as ordered.  After verbal consent given by patient/caregiver,  nasal swab obtained and sent to lab for processing.

## 2018-10-08 LAB — NOVEL CORONAVIRUS (COVID-19): SARS-CoV-2, NAA: NOT DETECTED

## 2018-10-08 LAB — COVID-19: SARS-CoV-2, NAA: NOT DETECTED

## 2018-10-21 ENCOUNTER — Ambulatory Visit: Attending: Nurse Practitioner | Primary: Nurse Practitioner

## 2018-10-21 ENCOUNTER — Ambulatory Visit
Admit: 2018-10-21 | Discharge: 2018-10-21 | Payer: PRIVATE HEALTH INSURANCE | Attending: Nurse Practitioner | Primary: Nurse Practitioner

## 2018-10-21 DIAGNOSIS — I10 Essential (primary) hypertension: Secondary | ICD-10-CM

## 2018-10-21 NOTE — Progress Notes (Signed)
Progress Notes by Erskine Squibbothran-Pate, Jahmarion Popoff L, NP at 10/21/18 1620                Author: Erskine Squibbothran-Pate, Deshonda Cryderman L, NP  Service: --  Author Type: Nurse Practitioner       Filed: 10/22/18 0015  Encounter Date: 10/21/2018  Status: Signed          Editor: Erskine Squibbothran-Pate, Karizma Cheek L, NP (Nurse Practitioner)                                  [facility]   [id number]   [place], [place] [id number]    (ph) [phone] (fax) [phone]   Jaquann Guarisco L. Cothran-Pate APRN, FNP-C            63 year old female comes into the office today to discuss a weight management med and get med refills.  She tried  the Contrave and it made her feel weird.  Therefore,  she stopped it.  She had a  visit on 06/03  and  all of her lab work has been done. Everything checked out okay.  She does have a body mass index of 34 and her TSH was normal.  She has been on Adipex  in the past and wanted it.  She was found to be tachycardic at her last visit; therefore, a cardiology consult was placed.  She saw Dr. 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 He repeated  an  echo and started  Toprol xl 50 every day. We tried Contrave due to current health issues and tachycardia. She is in agreement to try Weight Watchers rather than pills.           Allergies        Allergen  Reactions         ?  Lipitor [Atorvastatin]  Other (comments)             Muscle pain         ?  Lortab [Hydrocodone-Acetaminophen]  Itching         ?  Penicillins  Hives             Past Medical History:        Diagnosis  Date         ?  Arthritis       ?  Hypertension       ?  Thyroid disease            hypothyroid             Family History         Problem  Relation  Age of Onset          ?  Lung Disease  Mother       ?  Asthma  Mother       ?  Liver Disease  Father       ?  Stroke  Brother            ?  Diabetes  Brother               Social History          Socioeconomic History         ?   Marital status:  DIVORCED              Spouse name:  Not  on file         ?  Number of children:  Not on file     ?  Years of education:  Not on file     ?  Highest education level:  Not on file       Occupational History        ?  Not on file       Social Needs         ?  Financial resource strain:  Not on file        ?  Food insecurity              Worry:  Not on file         Inability:  Not on file        ?  Transportation needs              Medical:  Not on file         Non-medical:  Not on file       Tobacco Use         ?  Smoking status:  Former Smoker              Packs/day:  1.00         Years:  15.00         Pack years:  15.00         Last attempt to quit:  10/25/1996         Years since quitting:  22.0         ?  Smokeless tobacco:  Never Used       Substance and Sexual Activity         ?  Alcohol use:  No     ?  Drug use:  No     ?  Sexual activity:  Not on file       Lifestyle        ?  Physical activity              Days per week:  Not on file         Minutes per session:  Not on file         ?  Stress:  Not on file       Relationships        ?  Social Health visitor on phone:  Not on file         Gets together:  Not on file         Attends religious service:  Not on file         Active member of club or organization:  Not on file         Attends meetings of clubs or organizations:  Not on file         Relationship status:  Not on file        ?  Intimate partner violence              Fear of current or ex partner:  Not on file         Emotionally abused:  Not on file         Physically abused:  Not on file         Forced sexual activity:  Not on file        Other Topics  Concern        ?  Not on file       Social History Narrative        ?  Not on file             OB History                Gravida      4                Para      4                Term                       Preterm                       AB                       Living                                    SAB                        TAB                       Ectopic                       Molar                       Multiple                       Live Births                                             Past Surgical History:         Procedure  Laterality  Date          ?  HX APPENDECTOMY         ?  HX CERVICAL DISKECTOMY    09/16/06          Anterior C5-C6 diskectomy with decompression;  anterior cervical arthrodesis interbody technique;  anterior cervical instrumentation;  tricortical  iliac crest bone graft harvested through a separate incision.           ?  HX CHOLECYSTECTOMY              ?  HX TAH AND BSO                 Health Maintenance        Topic  Date Due         ?  Hepatitis C Screening   [birth year]     ?  PAP AKA CERVICAL CYTOLOGY   12/23/1977     ?  FOBT Q1Y Age 27-75   12/24/2006     ?  Medicare Yearly Exam   07/23/2018     ?  Shingrix Vaccine Age 57> (1 of 2)  11/18/2018 (Originally 12/24/2006)     ?  Breast Cancer Screen Mammogram   08/07/2020     ?  DTaP/Tdap/Td series (  2 - Td)  09/26/2022     ?  Lipid Screen   07/29/2023     ?  Bone Densitometry (Dexa) Screening   Completed         ?  Pneumococcal 0-64 years   Aged Out              Current Outpatient Medications:    ?  naltrexone-buPROPion (CONTRAVE) 8-90 mg TbER ER tablet, Week 1 1 tab PO QAM, Week 2 1QAM 1QHS, Week 3 2QAM 1 QHS, Week 4 & beyond 2QAM 2QHS, Disp: 70 Tab, Rfl: 0   ?  aspirin 81 mg chewable tablet, Take  by mouth daily., Disp: , Rfl:    ?  multivitamin, tx-iron-ca-min (THERA-M w/ IRON) 9 mg iron-400 mcg tab tablet, Take 1 Tab by mouth daily., Disp: , Rfl:    ?  metoprolol succinate (TOPROL-XL) 50 mg XL tablet, Take 1 Tab by mouth daily., Disp: 90 Tab, Rfl: 3   ?  cyclobenzaprine (Flexeril) 10 mg tablet, Take 1 Tab by mouth nightly., Disp: 30 Tab, Rfl: 2   ?  levothyroxine (SYNTHROID) 75 mcg tablet, Take 1 Tab by mouth Daily (before breakfast)., Disp: 30 Tab, Rfl: 1   ?  lisinopriL (PRINIVIL, ZESTRIL) 20 mg tablet, Take 0.5 Tabs by mouth daily., Disp: 30 Tab,  Rfl: 1      Review of Systems    Constitutional: Negative.     HENT: Negative.     Eyes: Negative.     Respiratory: Negative.     Cardiovascular: Negative.     Gastrointestinal: Negative.     Genitourinary: Negative.     Musculoskeletal: Negative.     Skin: Negative.     Neurological: Negative.     Endo/Heme/Allergies: Negative.     Psychiatric/Behavioral: Negative.     All other systems reviewed and are negative.         Visit Vitals      BP  120/72     Pulse  77     Temp  97.9 ??F (36.6 ??C)     Ht  5' (1.524 m)     Wt  176 lb (79.8 kg)     SpO2  98%        BMI  34.37 kg/m??            Physical Exam   Constitutional :        Appearance: She is well-developed.   HENT :       Head: Normocephalic.   Eyes :       Pupils: Pupils are equal, round, and reactive to light.   Neck :       Musculoskeletal: Normal range of motion.   Cardiovascular :       Rate and Rhythm: Normal rate and regular rhythm.      Heart sounds: No murmur. No friction rub. No gallop.     Pulmonary:       Effort: Pulmonary effort is normal.      Breath sounds: Normal breath sounds.   Abdominal :      General: Bowel sounds are normal.      Palpations: Abdomen is soft.     Musculoskeletal: Normal range of motion.    Skin:      General: Skin is warm and dry.   Neurological :       Mental Status: She is alert and oriented to person, place, and time.  Assessment & Plan:      1. Essential hypertension   - lisinopriL (PRINIVIL, ZESTRIL) 20 mg tablet; Take 0.5 Tabs by mouth daily.  Dispense: 30 Tab; Refill: 1      2. Hypothyroidism, unspecified type   - levothyroxine (SYNTHROID) 75 mcg tablet; Take 1 Tab by mouth Daily (before breakfast).  Dispense: 30 Tab; Refill: 1      3. Night muscle spasms   - cyclobenzaprine (Flexeril) 10 mg tablet; Take 1 Tab by mouth nightly.  Dispense: 30 Tab; Refill: 2      4. Obesity (BMI 30.0-34.9)   Will attempt Weight Watchers         20  minutes face to face time spent with the patient; Greater than 50%  counseling and/or coordination of care: the treatment regimen is extensive; detailed review. Recheck in 1-2 mths. Sooner for problems. This note was dictated using dragon voice recognition  software.  It has been proofread, but there may still exist voice recognition errors that the author did not detect.            Signed By:  Erskine SquibbSonya L Cothran-Pate, NP           October 22, 2018

## 2018-10-21 NOTE — Progress Notes (Signed)
[facility]  [address]  [place], [place] [address]   (ph) [phone] (fax) [phone]  Billal Rollo L. Cothran-Pate APRN, FNP-C        63 year old female comes into the office today to discuss a weight management med and get med refills.  She tried the Contrave and it made her feel weird.  Therefore,  she stopped it.  She had a  visit on 06/03  and  all of her lab work has been done. Everything checked out okay.  She does have a body mass index of 34 and her TSH was normal.  She has been on Adipex in the past and wanted it.  She was found to be tachycardic at her last visit; therefore, a cardiology consult was placed.  She saw Dr. 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 He repeated an  echo and started  Toprol xl 50 every day. We tried Contrave due to current health issues and tachycardia. She is in agreement to try Weight Watchers rather than pills.      Allergies   Allergen Reactions   ??? Lipitor [Atorvastatin] Other (comments)     Muscle pain   ??? Lortab [Hydrocodone-Acetaminophen] Itching   ??? Penicillins Hives       Past Medical History:   Diagnosis Date   ??? Arthritis    ??? Hypertension    ??? Thyroid disease     hypothyroid       Family History   Problem Relation Age of Onset   ??? Lung Disease Mother    ??? Asthma Mother    ??? Liver Disease Father    ??? Stroke Brother    ??? Diabetes Brother        Social History     Socioeconomic History   ??? Marital status: DIVORCED     Spouse name: Not on file   ??? Number of children: Not on file   ??? Years of education: Not on file   ??? Highest education level: Not on file   Occupational History   ??? Not on file   Social Needs   ??? Financial resource strain: Not on file   ??? Food insecurity     Worry: Not on file     Inability: Not on file   ??? Transportation needs     Medical: Not on file     Non-medical: Not on file   Tobacco Use   ??? Smoking status: Former Smoker      Packs/day: 1.00     Years: 15.00     Pack years: 15.00     Last attempt to quit: 10/25/1996     Years since quitting: 22.0   ??? Smokeless tobacco: Never Used   Substance and Sexual Activity   ??? Alcohol use: No   ??? Drug use: No   ??? Sexual activity: Not on file   Lifestyle   ??? Physical activity     Days per week: Not on file     Minutes per session: Not on file   ??? Stress: Not on file   Relationships   ??? Social Wellsite geologist on phone: Not on file     Gets together: Not on file     Attends religious service: Not on file     Active member of club or organization: Not on file     Attends meetings of clubs or organizations:  Not on file     Relationship status: Not on file   ??? Intimate partner violence     Fear of current or ex partner: Not on file     Emotionally abused: Not on file     Physically abused: Not on file     Forced sexual activity: Not on file   Other Topics Concern   ??? Not on file   Social History Narrative   ??? Not on file       OB History     Gravida   4    Para   4    Term        Preterm        AB        Living           SAB        TAB        Ectopic        Molar        Multiple        Live Births                    Past Surgical History:   Procedure Laterality Date   ??? HX APPENDECTOMY     ??? HX CERVICAL DISKECTOMY  09/16/06    Anterior C5-C6 diskectomy with decompression;  anterior cervical arthrodesis interbody technique;  anterior cervical instrumentation;  tricortical iliac crest bone graft harvested through a separate incision.    ??? HX CHOLECYSTECTOMY     ??? HX TAH AND BSO         Health Maintenance   Topic Date Due   ??? Hepatitis C Screening  1956-01-02   ??? PAP AKA CERVICAL CYTOLOGY  12/23/1977   ??? FOBT Q1Y Age 66-75  12/24/2006   ??? Medicare Yearly Exam  07/23/2018   ??? Shingrix Vaccine Age 66> (1 of 2) 11/18/2018 (Originally 12/24/2006)   ??? Breast Cancer Screen Mammogram  08/07/2020   ??? DTaP/Tdap/Td series (2 - Td) 09/26/2022   ??? Lipid Screen  07/29/2023    ??? Bone Densitometry (Dexa) Screening  Completed   ??? Pneumococcal 0-64 years  Aged Out         Current Outpatient Medications:   ???  naltrexone-buPROPion (CONTRAVE) 8-90 mg TbER ER tablet, Week 1 1 tab PO QAM, Week 2 1QAM 1QHS, Week 3 2QAM 1 QHS, Week 4 & beyond 2QAM 2QHS, Disp: 70 Tab, Rfl: 0  ???  aspirin 81 mg chewable tablet, Take  by mouth daily., Disp: , Rfl:   ???  multivitamin, tx-iron-ca-min (THERA-M w/ IRON) 9 mg iron-400 mcg tab tablet, Take 1 Tab by mouth daily., Disp: , Rfl:   ???  metoprolol succinate (TOPROL-XL) 50 mg XL tablet, Take 1 Tab by mouth daily., Disp: 90 Tab, Rfl: 3  ???  cyclobenzaprine (Flexeril) 10 mg tablet, Take 1 Tab by mouth nightly., Disp: 30 Tab, Rfl: 2  ???  levothyroxine (SYNTHROID) 75 mcg tablet, Take 1 Tab by mouth Daily (before breakfast)., Disp: 30 Tab, Rfl: 1  ???  lisinopriL (PRINIVIL, ZESTRIL) 20 mg tablet, Take 0.5 Tabs by mouth daily., Disp: 30 Tab, Rfl: 1    Review of Systems   Constitutional: Negative.    HENT: Negative.    Eyes: Negative.    Respiratory: Negative.    Cardiovascular: Negative.    Gastrointestinal: Negative.    Genitourinary: Negative.    Musculoskeletal: Negative.    Skin: Negative.    Neurological:  Negative.    Endo/Heme/Allergies: Negative.    Psychiatric/Behavioral: Negative.    All other systems reviewed and are negative.      Visit Vitals  BP 120/72   Pulse 77   Temp 97.9 ??F (36.6 ??C)   Ht 5' (1.524 m)   Wt 176 lb (79.8 kg)   SpO2 98%   BMI 34.37 kg/m??        Physical Exam  Constitutional:       Appearance: She is well-developed.   HENT:      Head: Normocephalic.   Eyes:      Pupils: Pupils are equal, round, and reactive to light.   Neck:      Musculoskeletal: Normal range of motion.   Cardiovascular:      Rate and Rhythm: Normal rate and regular rhythm.      Heart sounds: No murmur. No friction rub. No gallop.    Pulmonary:      Effort: Pulmonary effort is normal.      Breath sounds: Normal breath sounds.   Abdominal:      General: Bowel sounds are normal.       Palpations: Abdomen is soft.   Musculoskeletal: Normal range of motion.   Skin:     General: Skin is warm and dry.   Neurological:      Mental Status: She is alert and oriented to person, place, and time.               Assessment & Plan:    1. Essential hypertension  - lisinopriL (PRINIVIL, ZESTRIL) 20 mg tablet; Take 0.5 Tabs by mouth daily.  Dispense: 30 Tab; Refill: 1    2. Hypothyroidism, unspecified type  - levothyroxine (SYNTHROID) 75 mcg tablet; Take 1 Tab by mouth Daily (before breakfast).  Dispense: 30 Tab; Refill: 1    3. Night muscle spasms  - cyclobenzaprine (Flexeril) 10 mg tablet; Take 1 Tab by mouth nightly.  Dispense: 30 Tab; Refill: 2    4. Obesity (BMI 30.0-34.9)  Will attempt Weight Watchers      20  minutes face to face time spent with the patient; Greater than 50% counseling and/or coordination of care: the treatment regimen is extensive; detailed review. Recheck in 1-2 mths. Sooner for problems. This note was dictated using dragon voice recognition software.  It has been proofread, but there may still exist voice recognition errors that the author did not detect.      Signed By: Erskine SquibbSonya L Cothran-Pate, NP     October 22, 2018

## 2018-10-22 MED ORDER — CYCLOBENZAPRINE 10 MG TAB
10 mg | ORAL_TABLET | Freq: Every evening | ORAL | 2 refills | Status: DC
Start: 2018-10-22 — End: 2019-01-12

## 2018-10-22 MED ORDER — LEVOTHYROXINE 75 MCG TAB
75 mcg | ORAL_TABLET | Freq: Every day | ORAL | 1 refills | Status: DC
Start: 2018-10-22 — End: 2019-01-12

## 2018-10-22 MED ORDER — LISINOPRIL 20 MG TAB
20 mg | ORAL_TABLET | Freq: Every day | ORAL | 1 refills | Status: DC
Start: 2018-10-22 — End: 2019-01-12

## 2018-11-03 ENCOUNTER — Ambulatory Visit: Attending: Cardiovascular Disease | Primary: Nurse Practitioner

## 2018-11-03 ENCOUNTER — Ambulatory Visit
Admit: 2018-11-03 | Discharge: 2018-11-03 | Payer: PRIVATE HEALTH INSURANCE | Attending: Cardiovascular Disease | Primary: Nurse Practitioner

## 2018-11-03 DIAGNOSIS — R000 Tachycardia, unspecified: Secondary | ICD-10-CM

## 2018-11-03 MED ORDER — METOPROLOL SUCCINATE SR 50 MG 24 HR TAB
50 mg | ORAL_TABLET | Freq: Every day | ORAL | 3 refills | Status: DC
Start: 2018-11-03 — End: 2019-08-11

## 2018-11-03 NOTE — Progress Notes (Signed)
[facility]  [address], [address]  [place], [place] [address]  PHONE: [phone]    SUBJECTIVE: Sandra Harmon (Jun 28, 1955) is a 63 y.o. female seen for a follow up visit regarding the following:        ICD-10-CM ICD-9-CM   1. Tachycardia  R00.0 785.0   2. Hypothyroidism, unspecified type  E03.9 244.9   3. Essential hypertension  I10 401.9     Originally seen in 2007 for symptoms. Had normal echo and normal cath. Symptoms were gallbladder related. Now returns with sinus tachy. She is aware that she has this sinus tachy.  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 Better on the toprol xl 50 every day and echo normal.      Past Medical History, Past Surgical History, Family history, Social History, and Medications were all reviewed with the patient today and updated as necessary.    Outpatient Medications Marked as Taking for the 11/03/18 encounter (Office Visit) with Kailon Treese, Karie Kirks, MD   Medication Sig Dispense Refill   ??? metoprolol succinate (TOPROL-XL) 50 mg XL tablet Take 1 Tab by mouth daily. 90 Tab 3   ??? lisinopriL (PRINIVIL, ZESTRIL) 20 mg tablet Take 0.5 Tabs by mouth daily. 30 Tab 1   ??? levothyroxine (SYNTHROID) 75 mcg tablet Take 1 Tab by mouth Daily (before breakfast). 30 Tab 1   ??? cyclobenzaprine (Flexeril) 10 mg tablet Take 1 Tab by mouth nightly. 30 Tab 2   ??? aspirin 81 mg chewable tablet Take  by mouth daily.     ??? multivitamin, tx-iron-ca-min (THERA-M w/ IRON) 9 mg iron-400 mcg tab tablet Take 1 Tab by mouth daily.       Allergies   Allergen Reactions   ??? Lipitor [Atorvastatin] Other (comments)     Muscle pain   ??? Lortab [Hydrocodone-Acetaminophen] Itching   ??? Penicillins Hives     Past Medical History:   Diagnosis Date   ??? Arthritis    ??? Hypertension    ??? Thyroid disease     hypothyroid     Past Surgical History:   Procedure Laterality Date   ??? HX APPENDECTOMY     ??? HX CERVICAL  DISKECTOMY  09/16/06    Anterior C5-C6 diskectomy with decompression;  anterior cervical arthrodesis interbody technique;  anterior cervical instrumentation;  tricortical iliac crest bone graft harvested through a separate incision.    ??? HX CHOLECYSTECTOMY     ??? HX TAH AND BSO       Family History   Problem Relation Age of Onset   ??? Lung Disease Mother    ??? Asthma Mother    ??? Liver Disease Father    ??? Stroke Brother    ??? Diabetes Brother       Social History     Tobacco Use   ??? Smoking status: Former Smoker     Packs/day: 1.00     Years: 15.00     Pack years: 15.00     Last attempt to quit: 10/25/1996     Years since quitting: 22.0   ??? Smokeless tobacco: Never Used   Substance Use Topics   ??? Alcohol use: No     Pt does   have history of early onset cad or heart failure in immediate family members    Current Outpatient Medications:   ???  metoprolol succinate (TOPROL-XL) 50 mg  XL tablet, Take 1 Tab by mouth daily., Disp: 90 Tab, Rfl: 3  ???  lisinopriL (PRINIVIL, ZESTRIL) 20 mg tablet, Take 0.5 Tabs by mouth daily., Disp: 30 Tab, Rfl: 1  ???  levothyroxine (SYNTHROID) 75 mcg tablet, Take 1 Tab by mouth Daily (before breakfast)., Disp: 30 Tab, Rfl: 1  ???  cyclobenzaprine (Flexeril) 10 mg tablet, Take 1 Tab by mouth nightly., Disp: 30 Tab, Rfl: 2  ???  aspirin 81 mg chewable tablet, Take  by mouth daily., Disp: , Rfl:   ???  multivitamin, tx-iron-ca-min (THERA-M w/ IRON) 9 mg iron-400 mcg tab tablet, Take 1 Tab by mouth daily., Disp: , Rfl:   ???  naltrexone-buPROPion (CONTRAVE) 8-90 mg TbER ER tablet, Week 1 1 tab PO QAM, Week 2 1QAM 1QHS, Week 3 2QAM 1 QHS, Week 4 & beyond 2QAM 2QHS, Disp: 70 Tab, Rfl: 0        ROS:  Review of Systems - General ROS: negative for - chills, fatigue or fever  Hematological and Lymphatic ROS: negative for - bleeding problems, bruising or jaundice  Respiratory ROS: no cough, shortness of breath, or wheezing  Cardiovascular ROS: no chest pain or dyspnea on exertion  Gastrointestinal ROS: no abdominal  pain, change in bowel habits, or black or bloody stools  Neurological ROS: no TIA or stroke symptoms  All other systems negative.      Wt Readings from Last 3 Encounters:   11/03/18 173 lb (78.5 kg)   10/21/18 176 lb (79.8 kg)   09/15/18 172 lb 12.8 oz (78.4 kg)     Temp Readings from Last 3 Encounters:   10/21/18 97.9 ??F (36.6 ??C)   09/15/18 97.4 ??F (36.3 ??C) (Temporal)   08/13/18 98.2 ??F (36.8 ??C)     BP Readings from Last 3 Encounters:   11/03/18 132/88   10/21/18 120/72   09/15/18 128/88     Pulse Readings from Last 3 Encounters:   11/03/18 74   10/21/18 77   09/15/18 89           PHYSICAL EXAM:  Visit Vitals  BP 132/88   Pulse 74   Ht 5' (1.524 m)   Wt 173 lb (78.5 kg)   BMI 33.79 kg/m??       Physical Examination: General appearance - alert, well appearing, and in no distress  Mental status - alert, oriented to person, place, and time  Eyes - pupils equal and reactive, extraocular eye movements intact  Neck/lymph - supple, no significant adenopathy  Chest/lungs - clear to auscultation, no wheezes, rales or rhonchi, symmetric air entry  Heart/CV - normal rate, regular rhythm, normal S1, S2, no murmurs, rubs, clicks or gallops  Abdomen/GI - soft, nontender, nondistended, no masses or organomegaly  Musculoskeletal - no joint tenderness, deformity or swelling  Extremities - peripheral pulses normal, no pedal edema, no clubbing or cyanosis  Skin - normal coloration and turgor, no rashes, no suspicious skin lesions noted    EKG: normal EKG, normal sinus rhythm, unchanged from previous tracings.                   Medications reviewed and questions answered    No results found for this or any previous visit (from the past 672 hour(s)).  Lab Results   Component Value Date/Time    Cholesterol, total 175 07/29/2018 08:10 AM    HDL Cholesterol 59 07/29/2018 08:10 AM    LDL, calculated 86 07/29/2018 08:10 AM    VLDL, calculated  30 07/29/2018 08:10 AM    Triglyceride 149 07/29/2018 08:10 AM         ASSESSMENT and  PLAN        1. Hypothyroidism, unspecified type  Labs are good    2. Essential hypertension  controlled    3. Tachycardia  Autonomic dysfunction and inappropriate sinus tach. Repeat echo and start toprol xl 50 qd          Orders Placed This Encounter   ??? metoprolol succinate (TOPROL-XL) 50 mg XL tablet     Sig: Take 1 Tab by mouth daily.     Dispense:  90 Tab     Refill:  3       Follow-up and Dispositions    ?? Return in about 1 year (around 11/03/2019).               Hilliard ClarkJon M Sheletha Bow, MD  11/03/2018  3:41 PM

## 2018-11-03 NOTE — Progress Notes (Signed)
[facility]  [address], [address]  [place], [place] [address]  PHONE: [phone]    SUBJECTIVE: Sandra Harmon ([birth year]) is a 63 y.o. female seen for a follow up visit regarding the following:        ICD-10-CM ICD-9-CM   1. Tachycardia  R00.0 785.0   2. Hypothyroidism, unspecified type  E03.9 244.9   3. Essential hypertension  I10 401.9     Originally seen in 2007 for symptoms. Had normal echo and normal cath. Symptoms were gallbladder related. Now returns with sinus tachy. She is aware that she has this sinus tachy.  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 Better on the toprol xl 50 every day and echo normal.      Past Medical History, Past Surgical History, Family history, Social History, and Medications were all reviewed with the patient today and updated as necessary.    Outpatient Medications Marked as Taking for the 11/03/18 encounter (Office Visit) with Marlo Goodrich, Karie Kirks, MD   Medication Sig Dispense Refill   ??? metoprolol succinate (TOPROL-XL) 50 mg XL tablet Take 1 Tab by mouth daily. 90 Tab 3   ??? lisinopriL (PRINIVIL, ZESTRIL) 20 mg tablet Take 0.5 Tabs by mouth daily. 30 Tab 1   ??? levothyroxine (SYNTHROID) 75 mcg tablet Take 1 Tab by mouth Daily (before breakfast). 30 Tab 1   ??? cyclobenzaprine (Flexeril) 10 mg tablet Take 1 Tab by mouth nightly. 30 Tab 2   ??? aspirin 81 mg chewable tablet Take  by mouth daily.     ??? multivitamin, tx-iron-ca-min (THERA-M w/ IRON) 9 mg iron-400 mcg tab tablet Take 1 Tab by mouth daily.       Allergies   Allergen Reactions   ??? Lipitor [Atorvastatin] Other (comments)     Muscle pain   ??? Lortab [Hydrocodone-Acetaminophen] Itching   ??? Penicillins Hives     Past Medical History:   Diagnosis Date   ??? Arthritis    ??? Hypertension    ??? Thyroid disease     hypothyroid     Past Surgical History:   Procedure Laterality Date   ??? HX APPENDECTOMY      ??? HX CERVICAL DISKECTOMY  09/16/06    Anterior C5-C6 diskectomy with decompression;  anterior cervical arthrodesis interbody technique;  anterior cervical instrumentation;  tricortical iliac crest bone graft harvested through a separate incision.    ??? HX CHOLECYSTECTOMY     ??? HX TAH AND BSO       Family History   Problem Relation Age of Onset   ??? Lung Disease Mother    ??? Asthma Mother    ??? Liver Disease Father    ??? Stroke Brother    ??? Diabetes Brother       Social History     Tobacco Use   ??? Smoking status: Former Smoker     Packs/day: 1.00     Years: 15.00     Pack years: 15.00     Last attempt to quit: 10/25/1996     Years since quitting: 22.0   ??? Smokeless tobacco: Never Used   Substance Use Topics   ??? Alcohol use: No     Pt does   have history of early onset cad or heart failure in immediate family members    Current Outpatient Medications:   ???  metoprolol succinate (TOPROL-XL) 50 mg  XL tablet, Take 1 Tab by mouth daily., Disp: 90 Tab, Rfl: 3  ???  lisinopriL (PRINIVIL, ZESTRIL) 20 mg tablet, Take 0.5 Tabs by mouth daily., Disp: 30 Tab, Rfl: 1  ???  levothyroxine (SYNTHROID) 75 mcg tablet, Take 1 Tab by mouth Daily (before breakfast)., Disp: 30 Tab, Rfl: 1  ???  cyclobenzaprine (Flexeril) 10 mg tablet, Take 1 Tab by mouth nightly., Disp: 30 Tab, Rfl: 2  ???  aspirin 81 mg chewable tablet, Take  by mouth daily., Disp: , Rfl:   ???  multivitamin, tx-iron-ca-min (THERA-M w/ IRON) 9 mg iron-400 mcg tab tablet, Take 1 Tab by mouth daily., Disp: , Rfl:   ???  naltrexone-buPROPion (CONTRAVE) 8-90 mg TbER ER tablet, Week 1 1 tab PO QAM, Week 2 1QAM 1QHS, Week 3 2QAM 1 QHS, Week 4 & beyond 2QAM 2QHS, Disp: 70 Tab, Rfl: 0        ROS:  Review of Systems - General ROS: negative for - chills, fatigue or fever  Hematological and Lymphatic ROS: negative for - bleeding problems, bruising or jaundice  Respiratory ROS: no cough, shortness of breath, or wheezing  Cardiovascular ROS: no chest pain or dyspnea on exertion   Gastrointestinal ROS: no abdominal pain, change in bowel habits, or black or bloody stools  Neurological ROS: no TIA or stroke symptoms  All other systems negative.      Wt Readings from Last 3 Encounters:   11/03/18 173 lb (78.5 kg)   10/21/18 176 lb (79.8 kg)   09/15/18 172 lb 12.8 oz (78.4 kg)     Temp Readings from Last 3 Encounters:   10/21/18 97.9 ??F (36.6 ??C)   09/15/18 97.4 ??F (36.3 ??C) (Temporal)   08/13/18 98.2 ??F (36.8 ??C)     BP Readings from Last 3 Encounters:   11/03/18 132/88   10/21/18 120/72   09/15/18 128/88     Pulse Readings from Last 3 Encounters:   11/03/18 74   10/21/18 77   09/15/18 89           PHYSICAL EXAM:  Visit Vitals  BP 132/88   Pulse 74   Ht 5' (1.524 m)   Wt 173 lb (78.5 kg)   BMI 33.79 kg/m??       Physical Examination: General appearance - alert, well appearing, and in no distress  Mental status - alert, oriented to person, place, and time  Eyes - pupils equal and reactive, extraocular eye movements intact  Neck/lymph - supple, no significant adenopathy  Chest/lungs - clear to auscultation, no wheezes, rales or rhonchi, symmetric air entry  Heart/CV - normal rate, regular rhythm, normal S1, S2, no murmurs, rubs, clicks or gallops  Abdomen/GI - soft, nontender, nondistended, no masses or organomegaly  Musculoskeletal - no joint tenderness, deformity or swelling  Extremities - peripheral pulses normal, no pedal edema, no clubbing or cyanosis  Skin - normal coloration and turgor, no rashes, no suspicious skin lesions noted    EKG: normal EKG, normal sinus rhythm, unchanged from previous tracings.                   Medications reviewed and questions answered    No results found for this or any previous visit (from the past 672 hour(s)).  Lab Results   Component Value Date/Time    Cholesterol, total 175 07/29/2018 08:10 AM    HDL Cholesterol 59 07/29/2018 08:10 AM    LDL, calculated 86 07/29/2018 08:10 AM    VLDL, calculated  30 07/29/2018 08:10 AM     Triglyceride 149 07/29/2018 08:10 AM         ASSESSMENT and PLAN        1. Hypothyroidism, unspecified type  Labs are good    2. Essential hypertension  controlled    3. Tachycardia  Autonomic dysfunction and inappropriate sinus tach. Repeat echo and start toprol xl 50 qd          Orders Placed This Encounter   ??? metoprolol succinate (TOPROL-XL) 50 mg XL tablet     Sig: Take 1 Tab by mouth daily.     Dispense:  90 Tab     Refill:  3       Follow-up and Dispositions    ?? Return in about 1 year (around 11/03/2019).               Hilliard ClarkJon M Shaleta Ruacho, MD  11/03/2018  3:41 PM

## 2018-11-27 ENCOUNTER — Inpatient Hospital Stay: Admit: 2018-11-27 | Primary: Nurse Practitioner

## 2018-12-31 ENCOUNTER — Encounter

## 2019-01-12 ENCOUNTER — Ambulatory Visit: Attending: Nurse Practitioner | Primary: Nurse Practitioner

## 2019-01-12 ENCOUNTER — Ambulatory Visit
Admit: 2019-01-12 | Discharge: 2019-01-12 | Payer: PRIVATE HEALTH INSURANCE | Attending: Nurse Practitioner | Primary: Nurse Practitioner

## 2019-01-12 DIAGNOSIS — I10 Essential (primary) hypertension: Secondary | ICD-10-CM

## 2019-01-12 MED ORDER — LEVOTHYROXINE 75 MCG TAB
75 mcg | ORAL_TABLET | Freq: Every day | ORAL | 2 refills | Status: DC
Start: 2019-01-12 — End: 2019-05-11

## 2019-01-12 MED ORDER — LISINOPRIL 20 MG TAB
20 mg | ORAL_TABLET | Freq: Every day | ORAL | 2 refills | Status: DC
Start: 2019-01-12 — End: 2019-05-11

## 2019-01-12 MED ORDER — CYCLOBENZAPRINE 10 MG TAB
10 mg | ORAL_TABLET | Freq: Every evening | ORAL | 2 refills | Status: DC
Start: 2019-01-12 — End: 2019-05-11

## 2019-01-12 NOTE — Progress Notes (Signed)
Results to patient please.  Cholesterol is 184, triglycerides 300, HDL is 43, and LDL is 91.  Please encourage a low-carb diet.  This should help triglycerides.  Hemoglobin is 14.8.  Glucose is 105.  BUN and creatinine are normal.  Liver enzymes are normal.  Thyroid is normal.  Continue current meds and keep scheduled follow-up

## 2019-01-12 NOTE — Progress Notes (Signed)
Progress Notes by Dorthula Matas, NP at 01/12/19 1600                Author: Dorthula Matas, NP  Service: --  Author Type: Nurse Practitioner       Filed: 01/12/19 2831  Encounter Date: 01/12/2019  Status: Signed          Editor: Dorthula Matas, NP (Nurse Practitioner)                                  [facility]   [address]   [place] [address]    (ph) [phone] (fax) [phone]   Aidon Klemens L. Cothran-Pate APRN, FNP-C            63 year old female  comes in as a weight recheck and to get med refills. She never went to [employer] but  has been cutting down on what she eats. She has lost 2 lbs since last visit. She has had her mammogram, colonoscopy, and Dexa scan done. She denies smoking or any new issues. She is due a P&P. She has had the Shingrix vaccine x 2 as well as the influenza  vaccine.            Allergies        Allergen  Reactions         ?  Lipitor [Atorvastatin]  Other (comments)             Muscle pain         ?  Lortab [Hydrocodone-Acetaminophen]  Itching         ?  Penicillins  Hives             Past Medical History:        Diagnosis  Date         ?  Arthritis       ?  Hypertension       ?  Thyroid disease            hypothyroid             Family History         Problem  Relation  Age of Onset          ?  Lung Disease  Mother       ?  Asthma  Mother       ?  Liver Disease  Father       ?  Stroke  Brother            ?  Diabetes  Brother               Social History          Socioeconomic History         ?  Marital status:  DIVORCED              Spouse name:  Not on file         ?  Number of children:  Not on file         ?  Years of education:  Not on file     ?  Highest education level:  Not on file       Occupational History        ?  Not on file       Social Needs         ?  Financial resource strain:  Not on file        ?  Food insecurity              Worry:  Not on file         Inability:  Not on file        ?   Transportation needs              Medical:  Not on file         Non-medical:  Not on file       Tobacco Use         ?  Smoking status:  Former Smoker              Packs/day:  1.00         Years:  15.00         Pack years:  15.00         Last attempt to quit:  10/25/1996         Years since quitting:  22.2         ?  Smokeless tobacco:  Never Used       Substance and Sexual Activity         ?  Alcohol use:  No     ?  Drug use:  No     ?  Sexual activity:  Not on file       Lifestyle        ?  Physical activity              Days per week:  Not on file         Minutes per session:  Not on file         ?  Stress:  Not on file       Relationships        ?  Social Engineer, manufacturing systems on phone:  Not on file         Gets together:  Not on file         Attends religious service:  Not on file         Active member of club or organization:  Not on file         Attends meetings of clubs or organizations:  Not on file         Relationship status:  Not on file        ?  Intimate partner violence              Fear of current or ex partner:  Not on file         Emotionally abused:  Not on file         Physically abused:  Not on file         Forced sexual activity:  Not on file        Other Topics  Concern        ?  Not on file       Social History Narrative        ?  Not on file             OB History                Gravida      4  Para      4                Term                       Preterm                       AB                       Living                                    SAB                       TAB                       Ectopic                       Molar                       Multiple                       Live Births                                             Past Surgical History:         Procedure  Laterality  Date          ?  HX APPENDECTOMY         ?  HX CERVICAL DISKECTOMY    09/16/06          Anterior C5-C6 diskectomy with decompression;  anterior cervical arthrodesis interbody technique;   anterior cervical instrumentation;  tricortical  iliac crest bone graft harvested through a separate incision.           ?  HX CHOLECYSTECTOMY              ?  HX TAH AND BSO                 Health Maintenance        Topic  Date Due         ?  Hepatitis C Screening   03/08/1955     ?  PAP AKA CERVICAL CYTOLOGY   12/23/1977     ?  Medicare Yearly Exam   07/23/2018     ?  Flu Vaccine (1)  08/25/2019 (Originally 10/27/2018)     ?  Shingrix Vaccine Age 41> (2 of 2)  01/27/2020 (Originally 11/11/2018)     ?  Breast Cancer Screen Mammogram   08/07/2020     ?  DTaP/Tdap/Td series (2 - Td)  09/26/2022     ?  Lipid Screen   07/29/2023     ?  Colorectal Cancer Screening Combo   11/27/2023     ?  Bone Densitometry (Dexa) Screening   Completed         ?  Pneumococcal 0-64 years   Aged Out              Current Outpatient Medications:    ?  cyclobenzaprine (Flexeril) 10 mg tablet, Take 1 Tab by mouth nightly., Disp: 30 Tab, Rfl: 2   ?  levothyroxine (SYNTHROID) 75 mcg tablet, Take 1 Tab by mouth Daily (before breakfast)., Disp: 30 Tab, Rfl: 2   ?  lisinopriL (PRINIVIL, ZESTRIL) 20 mg tablet, Take 0.5 Tabs by mouth daily., Disp: 30 Tab, Rfl: 2   ?  metoprolol succinate (TOPROL-XL) 50 mg XL tablet, Take 1 Tab by mouth daily., Disp: 90 Tab, Rfl: 3   ?  aspirin 81 mg chewable tablet, Take  by mouth daily., Disp: , Rfl:    ?  multivitamin, tx-iron-ca-min (THERA-M w/ IRON) 9 mg iron-400 mcg tab tablet, Take 1 Tab by mouth daily., Disp: , Rfl:       Review of Systems    Constitutional: Negative.     HENT: Negative.     Eyes: Negative.     Respiratory: Negative.     Cardiovascular: Negative.     Gastrointestinal: Negative.     Genitourinary: Negative.     Musculoskeletal: Negative.          Muscle spasms at night (back)    Skin: Negative.     Neurological: Negative.     Endo/Heme/Allergies: Negative.     Psychiatric/Behavioral: Negative.     All other systems reviewed and are negative.         Visit Vitals      BP  122/66     Pulse  81      Temp  97.5 ??F (36.4 ??C) (Temporal)     Ht  5' (1.524 m)     Wt  174 lb 6.4 oz (79.1 kg)     SpO2  99%        BMI  34.06 kg/m??            Physical Exam   Constitutional :        Appearance: She is well-developed.   HENT :       Head: Normocephalic.   Eyes :       Pupils: Pupils are equal, round, and reactive to light.   Neck :       Musculoskeletal: Normal range of motion.   Cardiovascular :       Rate and Rhythm: Normal rate and regular rhythm.      Heart sounds: No murmur. No friction rub. No gallop.     Pulmonary:       Effort: Pulmonary effort is normal.      Breath sounds: Normal breath sounds.   Abdominal :      General: Bowel sounds are normal.      Palpations: Abdomen is soft.     Musculoskeletal: Normal range of motion.    Skin:      General: Skin is warm and dry.   Neurological :       Mental Status: She is alert and oriented to person, place, and time.         Assessment & Plan:      1. Essential hypertension   - lisinopriL (PRINIVIL, ZESTRIL) 20 mg tablet; Take 0.5 Tabs by mouth daily.  Dispense: 30 Tab; Refill: 2   - CBC WITH AUTOMATED DIFF   - METABOLIC PANEL, COMPREHENSIVE      2. Hypothyroidism, unspecified type   - levothyroxine (SYNTHROID) 75 mcg tablet; Take 1 Tab by mouth Daily (before breakfast).  Dispense: 30 Tab; Refill: 2   - TSH AND FREE T4  3. Mixed hyperlipidemia      - LIPID PANEL WITH LDL/HDL RATIO      4. Night muscle spasms   - cyclobenzaprine (Flexeril) 10 mg tablet; Take 1 Tab by mouth nightly.  Dispense: 30 Tab; Refill: 2      5. Encounter for hepatitis C screening test for low risk patient   - HEPATITIS C AB      20 minutes face to face time spent with the patient; Greater than 50% counseling and/or coordination of care: the treatment regimen is extensive; detailed review. Will notify of labs. P&P in one month.  This note was dictated  using dragon voice recognition software.  It has been proofread, but there may still exist voice recognition errors that the author did not  detect.            Signed By:  Erskine Squibb, NP           January 12, 2019

## 2019-01-12 NOTE — Progress Notes (Signed)
Patient notified and verbalized understanding

## 2019-01-12 NOTE — Progress Notes (Signed)
[facility]  [address]  [place], [place] [address]   (ph) [phone] (fax) [phone]  Amoy Steeves L. Cothran-Pate APRN, FNP-C        63 year old female  comes in as a weight recheck and to get med refills. She never went to Weight [employer]chers but has been cutting down on what she eats. She has lost 2 lbs since last visit. She has had her mammogram, colonoscopy, and Dexa scan done. She denies smoking or any new issues. She is due a P&P. She has had the Shingrix vaccine x 2 as well as the influenza vaccine.       Allergies   Allergen Reactions   ??? Lipitor [Atorvastatin] Other (comments)     Muscle pain   ??? Lortab [Hydrocodone-Acetaminophen] Itching   ??? Penicillins Hives       Past Medical History:   Diagnosis Date   ??? Arthritis    ??? Hypertension    ??? Thyroid disease     hypothyroid       Family History   Problem Relation Age of Onset   ??? Lung Disease Mother    ??? Asthma Mother    ??? Liver Disease Father    ??? Stroke Brother    ??? Diabetes Brother        Social History     Socioeconomic History   ??? Marital status: DIVORCED     Spouse name: Not on file   ??? Number of children: Not on file   ??? Years of education: Not on file   ??? Highest education level: Not on file   Occupational History   ??? Not on file   Social Needs   ??? Financial resource strain: Not on file   ??? Food insecurity     Worry: Not on file     Inability: Not on file   ??? Transportation needs     Medical: Not on file     Non-medical: Not on file   Tobacco Use   ??? Smoking status: Former Smoker     Packs/day: 1.00     Years: 15.00     Pack years: 15.00     Last attempt to quit: 10/25/1996     Years since quitting: 22.2   ??? Smokeless tobacco: Never Used   Substance and Sexual Activity   ??? Alcohol use: No   ??? Drug use: No   ??? Sexual activity: Not on file   Lifestyle   ??? Physical activity     Days per week: Not on file     Minutes per session: Not on file   ??? Stress: Not on file   Relationships   ??? Social Licensed conveyancer on phone: Not on file     Gets together: Not on file     Attends religious service: Not on file     Active member of club or organization: Not on file     Attends meetings of clubs or organizations: Not on file     Relationship status: Not on file   ??? Intimate partner violence     Fear of current or ex partner: Not on file     Emotionally abused: Not on file     Physically abused: Not on file     Forced sexual activity: Not on file   Other Topics Concern   ??? Not on file   Social History Narrative   ???  Not on file       OB History     Gravida   4    Para   4    Term        Preterm        AB        Living           SAB        TAB        Ectopic        Molar        Multiple        Live Births                    Past Surgical History:   Procedure Laterality Date   ??? HX APPENDECTOMY     ??? HX CERVICAL DISKECTOMY  09/16/06    Anterior C5-C6 diskectomy with decompression;  anterior cervical arthrodesis interbody technique;  anterior cervical instrumentation;  tricortical iliac crest bone graft harvested through a separate incision.    ??? HX CHOLECYSTECTOMY     ??? HX TAH AND BSO         Health Maintenance   Topic Date Due   ??? Hepatitis C Screening  1955/08/24   ??? PAP AKA CERVICAL CYTOLOGY  12/23/1977   ??? Medicare Yearly Exam  07/23/2018   ??? Flu Vaccine (1) 08/25/2019 (Originally 10/27/2018)   ??? Shingrix Vaccine Age 16> (2 of 2) 01/27/2020 (Originally 11/11/2018)   ??? Breast Cancer Screen Mammogram  08/07/2020   ??? DTaP/Tdap/Td series (2 - Td) 09/26/2022   ??? Lipid Screen  07/29/2023   ??? Colorectal Cancer Screening Combo  11/27/2023   ??? Bone Densitometry (Dexa) Screening  Completed   ??? Pneumococcal 0-64 years  Aged Out         Current Outpatient Medications:   ???  cyclobenzaprine (Flexeril) 10 mg tablet, Take 1 Tab by mouth nightly., Disp: 30 Tab, Rfl: 2  ???  levothyroxine (SYNTHROID) 75 mcg tablet, Take 1 Tab by mouth Daily (before breakfast)., Disp: 30 Tab, Rfl: 2   ???  lisinopriL (PRINIVIL, ZESTRIL) 20 mg tablet, Take 0.5 Tabs by mouth daily., Disp: 30 Tab, Rfl: 2  ???  metoprolol succinate (TOPROL-XL) 50 mg XL tablet, Take 1 Tab by mouth daily., Disp: 90 Tab, Rfl: 3  ???  aspirin 81 mg chewable tablet, Take  by mouth daily., Disp: , Rfl:   ???  multivitamin, tx-iron-ca-min (THERA-M w/ IRON) 9 mg iron-400 mcg tab tablet, Take 1 Tab by mouth daily., Disp: , Rfl:     Review of Systems   Constitutional: Negative.    HENT: Negative.    Eyes: Negative.    Respiratory: Negative.    Cardiovascular: Negative.    Gastrointestinal: Negative.    Genitourinary: Negative.    Musculoskeletal: Negative.         Muscle spasms at night (back)   Skin: Negative.    Neurological: Negative.    Endo/Heme/Allergies: Negative.    Psychiatric/Behavioral: Negative.    All other systems reviewed and are negative.      Visit Vitals  BP 122/66   Pulse 81   Temp 97.5 ??F (36.4 ??C) (Temporal)   Ht 5' (1.524 m)   Wt 174 lb 6.4 oz (79.1 kg)   SpO2 99%   BMI 34.06 kg/m??        Physical Exam  Constitutional:       Appearance: She is well-developed.   HENT:  Head: Normocephalic.   Eyes:      Pupils: Pupils are equal, round, and reactive to light.   Neck:      Musculoskeletal: Normal range of motion.   Cardiovascular:      Rate and Rhythm: Normal rate and regular rhythm.      Heart sounds: No murmur. No friction rub. No gallop.    Pulmonary:      Effort: Pulmonary effort is normal.      Breath sounds: Normal breath sounds.   Abdominal:      General: Bowel sounds are normal.      Palpations: Abdomen is soft.   Musculoskeletal: Normal range of motion.   Skin:     General: Skin is warm and dry.   Neurological:      Mental Status: She is alert and oriented to person, place, and time.       Assessment & Plan:    1. Essential hypertension  - lisinopriL (PRINIVIL, ZESTRIL) 20 mg tablet; Take 0.5 Tabs by mouth daily.  Dispense: 30 Tab; Refill: 2  - CBC WITH AUTOMATED DIFF  - METABOLIC PANEL, COMPREHENSIVE     2. Hypothyroidism, unspecified type  - levothyroxine (SYNTHROID) 75 mcg tablet; Take 1 Tab by mouth Daily (before breakfast).  Dispense: 30 Tab; Refill: 2  - TSH AND FREE T4    3. Mixed hyperlipidemia    - LIPID PANEL WITH LDL/HDL RATIO    4. Night muscle spasms  - cyclobenzaprine (Flexeril) 10 mg tablet; Take 1 Tab by mouth nightly.  Dispense: 30 Tab; Refill: 2    5. Encounter for hepatitis C screening test for low risk patient  - HEPATITIS C AB    20 minutes face to face time spent with the patient; Greater than 50% counseling and/or coordination of care: the treatment regimen is extensive; detailed review. Will notify of labs. P&P in one month.  This note was dictated using dragon voice recognition software.  It has been proofread, but there may still exist voice recognition errors that the author did not detect.      Signed By: Dorthula Matas, NP     January 12, 2019

## 2019-01-12 NOTE — Progress Notes (Signed)
Results to patient please.  Cholesterol is 184, triglycerides 300, HDL is 43, and LDL is 91.  Please encourage a low-carb diet.  This should help triglycerides.  Hemoglobin is 14.8.  Glucose is 105.  BUN and creatinine are normal.  Liver enzymes are normal.  Thyroid is normal.  Continue current meds and keep scheduled follow-up

## 2019-01-13 LAB — HEPATITIS C AB: Hep C Virus Ab: <0.1 s/co ratio (ref 0.0–0.9)

## 2019-01-13 LAB — METABOLIC PANEL, COMPREHENSIVE
A-G Ratio: 2.2 (ref 1.2–2.2)
ALT (SGPT): 20 IU/L (ref 0–32)
AST (SGOT): 21 IU/L (ref 0–40)
Albumin: 4.2 g/dL (ref 3.8–4.8)
Alk. phosphatase: 78 IU/L (ref 39–117)
BUN/Creatinine ratio: 25 (ref 12–28)
BUN: 15 mg/dL (ref 8–27)
Bilirubin, total: <0.2 mg/dL (ref 0.0–1.2)
CO2: 20 mmol/L (ref 20–29)
Calcium: 9.5 mg/dL (ref 8.7–10.3)
Chloride: 110 mmol/L — ABNORMAL HIGH (ref 96–106)
Creatinine: 0.61 mg/dL (ref 0.57–1.00)
GFR est AA: 112 mL/min/{1.73_m2} (ref >59)
GFR est non-AA: 97 mL/min/{1.73_m2} (ref >59)
GLOBULIN, TOTAL: 1.9 g/dL (ref 1.5–4.5)
Glucose: 105 mg/dL — ABNORMAL HIGH (ref 65–99)
Potassium: 4.2 mmol/L (ref 3.5–5.2)
Protein, total: 6.1 g/dL (ref 6.0–8.5)
Sodium: 144 mmol/L (ref 134–144)

## 2019-01-13 LAB — LIPID PANEL WITH LDL/HDL RATIO
Cholesterol, Total: 184 mg/dL (ref 100–199)
Cholesterol, total: 184 mg/dL (ref 100–199)
HDL Cholesterol: 43 mg/dL (ref >39)
HDL: 43 mg/dL (ref >39)
LDL Calculated: 91 mg/dL (ref 0–99)
LDL, calculated: 91 mg/dL (ref 0–99)
LDL/HDL Ratio: 2.1 ratio (ref 0.0–3.2)
LDL/HDL Ratio: 2.1 ratio (ref 0.0–3.2)
Triglyceride: 300 mg/dL — ABNORMAL HIGH (ref 0–149)
Triglycerides: 300 mg/dL — ABNORMAL HIGH (ref 0–149)
VLDL, calculated: 50 mg/dL — ABNORMAL HIGH (ref 5–40)
VLDL: 50 mg/dL — ABNORMAL HIGH (ref 5–40)

## 2019-01-13 LAB — CBC WITH AUTOMATED DIFF
ABS. BASOPHILS: 0.1 10*3/uL (ref 0.0–0.2)
ABS. EOSINOPHILS: 0.1 10*3/uL (ref 0.0–0.4)
ABS. IMM. GRANS.: 0.1 10*3/uL (ref 0.0–0.1)
ABS. MONOCYTES: 0.8 10*3/uL (ref 0.1–0.9)
ABS. NEUTROPHILS: 6.5 10*3/uL (ref 1.4–7.0)
Abs Lymphocytes: 2 10*3/uL (ref 0.7–3.1)
BASOPHILS: 1 %
EOSINOPHILS: 1 %
HCT: 43.6 % (ref 34.0–46.6)
HGB: 14.8 g/dL (ref 11.1–15.9)
IMMATURE GRANULOCYTES: 1 %
Lymphocytes: 21 %
MCH: 30.5 pg (ref 26.6–33.0)
MCHC: 33.9 g/dL (ref 31.5–35.7)
MCV: 90 fL (ref 79–97)
MONOCYTES: 8 %
NEUTROPHILS: 68 %
PLATELET: 315 10*3/uL (ref 150–450)
RBC: 4.86 x10E6/uL (ref 3.77–5.28)
RDW: 12.8 % (ref 11.7–15.4)
WBC: 9.6 10*3/uL (ref 3.4–10.8)

## 2019-01-13 LAB — TSH AND FREE T4
T4, Free: 0.84 ng/dL (ref 0.82–1.77)
TSH: 2.36 u[IU]/mL (ref 0.450–4.500)

## 2019-01-13 LAB — CBC WITH AUTO DIFFERENTIAL
Basophils %: 1 %
Basophils Absolute: 0.1 10*3/uL (ref 0.0–0.2)
Eosinophils %: 1 %
Eosinophils Absolute: 0.1 10*3/uL (ref 0.0–0.4)
Granulocyte Absolute Count: 0.1 10*3/uL (ref 0.0–0.1)
Hematocrit: 43.6 % (ref 34.0–46.6)
Hemoglobin: 14.8 g/dL (ref 11.1–15.9)
Immature Granulocytes: 1 %
Lymphocytes %: 21 %
Lymphocytes Absolute: 2 10*3/uL (ref 0.7–3.1)
MCH: 30.5 pg (ref 26.6–33.0)
MCHC: 33.9 g/dL (ref 31.5–35.7)
MCV: 90 fL (ref 79–97)
Monocytes %: 8 %
Monocytes Absolute: 0.8 10*3/uL (ref 0.1–0.9)
Neutrophils %: 68 %
Neutrophils Absolute: 6.5 10*3/uL (ref 1.4–7.0)
Platelets: 315 10*3/uL (ref 150–450)
RBC: 4.86 x10E6/uL (ref 3.77–5.28)
RDW: 12.8 % (ref 11.7–15.4)
WBC: 9.6 10*3/uL (ref 3.4–10.8)

## 2019-01-13 LAB — TSH + FREE T4 PANEL
T4 Free: 0.84 ng/dL (ref 0.82–1.77)
TSH: 2.36 u[IU]/mL (ref 0.450–4.500)

## 2019-01-13 LAB — COMPREHENSIVE METABOLIC PANEL
ALT: 20 IU/L (ref 0–32)
AST: 21 IU/L (ref 0–40)
Albumin/Globulin Ratio: 2.2 NA (ref 1.2–2.2)
Albumin: 4.2 g/dL (ref 3.8–4.8)
Alkaline Phosphatase: 78 IU/L (ref 39–117)
BUN: 15 mg/dL (ref 8–27)
Bun/Cre Ratio: 25 NA (ref 12–28)
CO2: 20 mmol/L (ref 20–29)
Calcium: 9.5 mg/dL (ref 8.7–10.3)
Chloride: 110 mmol/L — ABNORMAL HIGH (ref 96–106)
Creatinine: 0.61 mg/dL (ref 0.57–1.00)
EGFR IF NonAfrican American: 97 mL/min/{1.73_m2} (ref >59)
GFR African American: 112 mL/min/{1.73_m2} (ref >59)
Globulin, Total: 1.9 g/dL (ref 1.5–4.5)
Glucose: 105 mg/dL — ABNORMAL HIGH (ref 65–99)
Potassium: 4.2 mmol/L (ref 3.5–5.2)
Sodium: 144 mmol/L (ref 134–144)
Total Bilirubin: <0.2 mg/dL (ref 0.0–1.2)
Total Protein: 6.1 g/dL (ref 6.0–8.5)

## 2019-01-13 LAB — HEPATITIS C ANTIBODY: HCV Ab: <0.1 s/co ratio (ref 0.0–0.9)

## 2019-01-20 ENCOUNTER — Encounter: Attending: Nurse Practitioner | Primary: Nurse Practitioner

## 2019-02-09 ENCOUNTER — Ambulatory Visit: Attending: Nurse Practitioner | Primary: Nurse Practitioner

## 2019-02-09 ENCOUNTER — Ambulatory Visit
Admit: 2019-02-09 | Discharge: 2019-02-09 | Payer: MEDICARE | Attending: Nurse Practitioner | Primary: Nurse Practitioner

## 2019-02-09 DIAGNOSIS — I10 Essential (primary) hypertension: Secondary | ICD-10-CM

## 2019-02-09 NOTE — Progress Notes (Addendum)
Progress Notes by Erskine Squibb, NP at 02/09/19 1540                Author: Erskine Squibb, NP  Service: --  Author Type: Nurse Practitioner       Filed: 02/09/19 1622  Encounter Date: 02/09/2019  Status: Signed          Editor: Erskine Squibb, NP (Nurse Practitioner)                    ??   [facility]   [address]   [place], [place] [address]    (ph) [phone] (fax) [phone]   Shatori Bertucci L. Cothran-Pate APRN, FNP-C      This is the Subsequent Medicare Annual Wellness Exam, performed 12 months or more after the Initial AWV or the last Subsequent AWV      I have reviewed the patient's medical history in detail and updated the computerized patient record.         Depression Risk Factor Screening:           3 most recent PHQ Screens  02/09/2019        Little interest or pleasure in doing things  Not at all     Feeling down, depressed, irritable, or hopeless  Not at all        Total Score PHQ 2  0             Alcohol Risk Screen     Do you average more than 1 drink per night or more than 7 drinks a week:  No      On any one occasion in the past three months have you have had more than 3 drinks containing alcohol:  No              Functional Ability and Level of Safety:     Hearing:  Hearing is good.       Activities of Daily Living:   The home contains: no safety equipment.   Patient does total self care       Ambulation: with no difficulty       Fall Risk:      Fall Risk Assessment, last 12 mths  07/23/2018        Able to walk?  Yes        Fall in past 12 months?  No        Abuse Screen:   Patient is not abused            Cognitive Screening     Has your family/caregiver stated any concerns about your memory: no       Cognitive Screening: Normal - Clock Drawing Test, Mini Cog Test        Assessment/Plan     Education and counseling provided:   Are appropriate based on today's review and evaluation      1. Medicare annual wellness visit, subsequent       2. Screening for Depression   PHQ2=0      3. Screening for alcoholism   Pt denies drinking      4. Screening immunizations   Immunizations UTD      5. Screening for Hep C   01/12/19- negative      6. Screening for colon cancer   Colonoscopy 11/27/18      7. Screening for breast cancer   Mammogram UTD-06/20      8.  Screening for cervical cancer   Partial hysterectomy years ago; not due to cancer per pt.   Will schedule pap      9. Postmenopausal   Dexa scan 07/2018; due again 07/2020   Continue Calcium and Vitamin D daily      10. Routine eye exam   Pt scheduling appt.       Reviewed health care maintenance. All appropriate screenings ordered or discussed/offered that are recommended or due. Information provided on Advanced  Directive,Living Will and POA with AVS. Pt has one on file. Pt to return for next Medicare Wellness in 1 year.           Health Maintenance Due          Health Maintenance Due        Topic  Date Due         ?  PAP AKA CERVICAL CYTOLOGY   12/23/1977         ?  Medicare Yearly Exam   07/23/2018             Patient Care Team     Patient Care Team:   Cothran-Pate, Maylene Roes, NP as PCP - General (Nurse Practitioner)   Cothran-Pate, Maylene Roes, NP as PCP - [facility] Empaneled Provider   Bittrick, Orland Penman, MD (Cardiology)        History          Patient Active Problem List        Diagnosis  Code         ?  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  Primary localized osteoarthrosis, shoulder region  M19.019     ?  Chest pain  R07.9     ?  Hypomagnesemia  E83.42     ?  Altered mental status, unspecified  R41.82     ?  Hx-TIA (transient ischemic attack)  Z86.73     ?  Syncope, vasovagal  R55     ?  Encounter for medication management  [phone]         ?  Lightheadedness  R42          Past Medical History:        Diagnosis  Date         ?  Arthritis       ?  Hypertension       ?  Thyroid disease            hypothyroid           Past Surgical History:         Procedure  Laterality  Date          ?  HX APPENDECTOMY          ?  HX CERVICAL DISKECTOMY    09/16/06          Anterior C5-C6 diskectomy with decompression;  anterior cervical arthrodesis interbody technique;  anterior cervical instrumentation;  tricortical  iliac crest bone graft harvested through a separate incision.           ?  HX CHOLECYSTECTOMY              ?  HX TAH AND BSO              Current Outpatient Medications          Medication  Sig  Dispense  Refill           ?  cyclobenzaprine (  Flexeril) 10 mg tablet  Take 1 Tab by mouth nightly.  30 Tab  2     ?  levothyroxine (SYNTHROID) 75 mcg tablet  Take 1 Tab by mouth Daily (before breakfast).  30 Tab  2     ?  lisinopriL (PRINIVIL, ZESTRIL) 20 mg tablet  Take 0.5 Tabs by mouth daily.  30 Tab  2     ?  metoprolol succinate (TOPROL-XL) 50 mg XL tablet  Take 1 Tab by mouth daily.  90 Tab  3     ?  aspirin 81 mg chewable tablet  Take  by mouth daily.               ?  multivitamin, tx-iron-ca-min (THERA-M w/ IRON) 9 mg iron-400 mcg tab tablet  Take 1 Tab by mouth daily.              Allergies        Allergen  Reactions         ?  Lipitor [Atorvastatin]  Other (comments)             Muscle pain         ?  Lortab [Hydrocodone-Acetaminophen]  Itching         ?  Penicillins  Hives             Family History         Problem  Relation  Age of Onset          ?  Lung Disease  Mother       ?  Asthma  Mother       ?  Liver Disease  Father       ?  Stroke  Brother            ?  Diabetes  Brother            Social History          Tobacco Use         ?  Smoking status:  Former Smoker              Packs/day:  1.00         Years:  15.00         Pack years:  15.00         Last attempt to quit:  10/25/1996         Years since quitting:  22.3         ?  Smokeless tobacco:  Never Used       Substance Use Topics         ?  Alcohol use:  No

## 2019-02-09 NOTE — Progress Notes (Signed)
Progress Notes by Cothran-Pate, Carden Teel L, NP at 02/09/19 1540                Author: Erskine Squibbothran-Pate, Keiandra Sullenger L, NP  Service: --  Author Type: Nurse PractiErskine Squibbtioner       Filed: 02/09/19 1622  Encounter Date: 02/09/2019  Status: Signed          Editor: Erskine Squibbothran-Pate, Estrellita Lasky L, NP (Nurse Practitioner)                                  [facility]   [address]   [place], [place] [id number]    (ph) [phone] (fax) [phone]   Zenora Karpel L. Cothran-Pate APRN, FNP-C            63 yo female in for recheck of chronic conditions. She is also here for her AWV and med refills. She denies any  new issues.           Allergies        Allergen  Reactions         ?  Lipitor [Atorvastatin]  Other (comments)             Muscle pain         ?  Lortab [Hydrocodone-Acetaminophen]  Itching         ?  Penicillins  Hives             Past Medical History:        Diagnosis  Date         ?  Arthritis       ?  Hypertension       ?  Thyroid disease            hypothyroid             Family History         Problem  Relation  Age of Onset          ?  Lung Disease  Mother       ?  Asthma  Mother       ?  Liver Disease  Father       ?  Stroke  Brother            ?  Diabetes  Brother               Social History          Socioeconomic History         ?  Marital status:  DIVORCED              Spouse name:  Not on file         ?  Number of children:  Not on file     ?  Years of education:  Not on file         ?  Highest education level:  Not on file       Occupational History        ?  Not on file       Social Needs         ?  Financial resource strain:  Not on file        ?  Food insecurity              Worry:  Not on file         Inability:  Not on file        ?  Transportation needs              Medical:  Not on file         Non-medical:  Not on file       Tobacco Use         ?  Smoking status:  Former Smoker              Packs/day:  1.00         Years:  15.00         Pack years:  15.00         Last attempt to  quit:  10/25/1996         Years since quitting:  22.3         ?  Smokeless tobacco:  Never Used       Substance and Sexual Activity         ?  Alcohol use:  No     ?  Drug use:  No     ?  Sexual activity:  Not on file       Lifestyle        ?  Physical activity              Days per week:  Not on file         Minutes per session:  Not on file         ?  Stress:  Not on file       Relationships        ?  Social Engineer, manufacturing systems on phone:  Not on file         Gets together:  Not on file         Attends religious service:  Not on file         Active member of club or organization:  Not on file         Attends meetings of clubs or organizations:  Not on file         Relationship status:  Not on file        ?  Intimate partner violence              Fear of current or ex partner:  Not on file         Emotionally abused:  Not on file         Physically abused:  Not on file         Forced sexual activity:  Not on file        Other Topics  Concern        ?  Not on file       Social History Narrative        ?  Not on file             OB History                Gravida      4                Para      4                Term                       Preterm  AB                       Living                                    SAB                       TAB                       Ectopic                       Molar                       Multiple                       Live Births                                             Past Surgical History:         Procedure  Laterality  Date          ?  HX APPENDECTOMY         ?  HX CERVICAL DISKECTOMY    09/16/06          Anterior C5-C6 diskectomy with decompression;  anterior cervical arthrodesis interbody technique;  anterior cervical instrumentation;  tricortical  iliac crest bone graft harvested through a separate incision.           ?  HX CHOLECYSTECTOMY              ?  HX TAH AND BSO                 Health Maintenance        Topic  Date Due         ?  PAP AKA  CERVICAL CYTOLOGY   12/23/1977     ?  Medicare Yearly Exam   07/23/2018     ?  Breast Cancer Screen Mammogram   08/07/2020     ?  DTaP/Tdap/Td series (2 - Td)  09/26/2022     ?  Colorectal Cancer Screening Combo   11/27/2023     ?  Lipid Screen   01/12/2024     ?  Hepatitis C Screening   Completed     ?  Bone Densitometry (Dexa) Screening   Completed     ?  Shingrix Vaccine Age 61>   Completed     ?  Flu Vaccine   Completed         ?  Pneumococcal 0-64 years   Aged Out              Current Outpatient Medications:    ?  cyclobenzaprine (Flexeril) 10 mg tablet, Take 1 Tab by mouth nightly., Disp: 30 Tab, Rfl: 2   ?  levothyroxine (SYNTHROID) 75 mcg tablet, Take 1 Tab by mouth Daily (before breakfast)., Disp: 30 Tab, Rfl: 2   ?  lisinopriL (PRINIVIL, ZESTRIL) 20 mg tablet, Take 0.5 Tabs by mouth daily., Disp: 30 Tab, Rfl: 2   ?  metoprolol succinate (TOPROL-XL) 50 mg XL  tablet, Take 1 Tab by mouth daily., Disp: 90 Tab, Rfl: 3   ?  aspirin 81 mg chewable tablet, Take  by mouth daily., Disp: , Rfl:    ?  multivitamin, tx-iron-ca-min (THERA-M w/ IRON) 9 mg iron-400 mcg tab tablet, Take 1 Tab by mouth daily., Disp: , Rfl:       Review of Systems    Constitutional: Negative.     HENT: Negative.     Eyes: Negative.     Respiratory: Negative.     Cardiovascular: Negative.     Gastrointestinal: Negative.     Genitourinary: Negative.     Musculoskeletal: Negative.     Skin: Negative.     Neurological: Negative.     Endo/Heme/Allergies: Negative.     Psychiatric/Behavioral: Negative.     All other systems reviewed and are negative.         Visit Vitals      BP  126/82     Pulse  82     Temp  98.2 ??F (36.8 ??C) (Temporal)     Ht  5' (1.524 m)     Wt  170 lb 6.4 oz (77.3 kg)     SpO2  99%        BMI  33.28 kg/m??            Physical Exam   Constitutional :        Appearance: She is well-developed.   HENT :       Head: Normocephalic.   Eyes :       Pupils: Pupils are equal, round, and reactive to light.   Neck :        Musculoskeletal: Normal range of motion.   Cardiovascular :       Rate and Rhythm: Normal rate and regular rhythm.      Heart sounds: No murmur. No friction rub. No gallop.     Pulmonary:       Effort: Pulmonary effort is normal.      Breath sounds: Normal breath sounds.   Abdominal :      General: Bowel sounds are normal.      Palpations: Abdomen is soft.     Musculoskeletal: Normal range of motion.    Skin:      General: Skin is warm and dry.   Neurological :       Mental Status: She is alert and oriented to person, place, and time.            Assessment & Plan:      1. Essential hypertension   Continue meds      2. Hypertriglyceridemia   Low carb diet and recheck lipids at return visit      3. Hypothyroidism, unspecified type   Continue med      4. Medicare annual wellness visit, subsequent      25  minutes face to face time spent with the patient; Greater than 50% counseling and/or coordination of care: the treatment regimen is extensive; detailed review. Recheck  in 2-3 months, sooner for problems. This note was dictated using dragon voice recognition software.  It has been proofread, but there may still exist voice recognition errors that the author did not detect.            Signed By:  Dorthula Matas, NP           February 09, 2019

## 2019-02-09 NOTE — Progress Notes (Signed)
[facility]  [address]  [place] [address]   (ph) [phone] (fax) [phone]  Hanne Kegg L. Cothran-Pate APRN, FNP-C        63 yo female in for recheck of chronic conditions. She is also here for her AWV and med refills. She denies any new issues.      Allergies   Allergen Reactions   ??? Lipitor [Atorvastatin] Other (comments)     Muscle pain   ??? Lortab [Hydrocodone-Acetaminophen] Itching   ??? Penicillins Hives       Past Medical History:   Diagnosis Date   ??? Arthritis    ??? Hypertension    ??? Thyroid disease     hypothyroid       Family History   Problem Relation Age of Onset   ??? Lung Disease Mother    ??? Asthma Mother    ??? Liver Disease Father    ??? Stroke Brother    ??? Diabetes Brother        Social History     Socioeconomic History   ??? Marital status: DIVORCED     Spouse name: Not on file   ??? Number of children: Not on file   ??? Years of education: Not on file   ??? Highest education level: Not on file   Occupational History   ??? Not on file   Social Needs   ??? Financial resource strain: Not on file   ??? Food insecurity     Worry: Not on file     Inability: Not on file   ??? Transportation needs     Medical: Not on file     Non-medical: Not on file   Tobacco Use   ??? Smoking status: Former Smoker     Packs/day: 1.00     Years: 15.00     Pack years: 15.00     Last attempt to quit: 10/25/1996     Years since quitting: 22.3   ??? Smokeless tobacco: Never Used   Substance and Sexual Activity   ??? Alcohol use: No   ??? Drug use: No   ??? Sexual activity: Not on file   Lifestyle   ??? Physical activity     Days per week: Not on file     Minutes per session: Not on file   ??? Stress: Not on file   Relationships   ??? Social Product manager on phone: Not on file     Gets together: Not on file     Attends religious service: Not on file     Active member of club or organization: Not on file     Attends meetings of clubs or organizations: Not on file      Relationship status: Not on file   ??? Intimate partner violence     Fear of current or ex partner: Not on file     Emotionally abused: Not on file     Physically abused: Not on file     Forced sexual activity: Not on file   Other Topics Concern   ??? Not on file   Social History Narrative   ??? Not on file       OB History     Gravida   4    Para   4    Term        Preterm        AB  Living           SAB        TAB        Ectopic        Molar        Multiple        Live Births                    Past Surgical History:   Procedure Laterality Date   ??? HX APPENDECTOMY     ??? HX CERVICAL DISKECTOMY  09/16/06    Anterior C5-C6 diskectomy with decompression;  anterior cervical arthrodesis interbody technique;  anterior cervical instrumentation;  tricortical iliac crest bone graft harvested through a separate incision.    ??? HX CHOLECYSTECTOMY     ??? HX TAH AND BSO         Health Maintenance   Topic Date Due   ??? PAP AKA CERVICAL CYTOLOGY  12/23/1977   ??? Medicare Yearly Exam  07/23/2018   ??? Breast Cancer Screen Mammogram  08/07/2020   ??? DTaP/Tdap/Td series (2 - Td) 09/26/2022   ??? Colorectal Cancer Screening Combo  11/27/2023   ??? Lipid Screen  01/12/2024   ??? Hepatitis C Screening  Completed   ??? Bone Densitometry (Dexa) Screening  Completed   ??? Shingrix Vaccine Age 50>  Completed   ??? Flu Vaccine  Completed   ??? Pneumococcal 0-64 years  Aged Out         Current Outpatient Medications:   ???  cyclobenzaprine (Flexeril) 10 mg tablet, Take 1 Tab by mouth nightly., Disp: 30 Tab, Rfl: 2  ???  levothyroxine (SYNTHROID) 75 mcg tablet, Take 1 Tab by mouth Daily (before breakfast)., Disp: 30 Tab, Rfl: 2  ???  lisinopriL (PRINIVIL, ZESTRIL) 20 mg tablet, Take 0.5 Tabs by mouth daily., Disp: 30 Tab, Rfl: 2  ???  metoprolol succinate (TOPROL-XL) 50 mg XL tablet, Take 1 Tab by mouth daily., Disp: 90 Tab, Rfl: 3  ???  aspirin 81 mg chewable tablet, Take  by mouth daily., Disp: , Rfl:    ???  multivitamin, tx-iron-ca-min (THERA-M w/ IRON) 9 mg iron-400 mcg tab tablet, Take 1 Tab by mouth daily., Disp: , Rfl:     Review of Systems   Constitutional: Negative.    HENT: Negative.    Eyes: Negative.    Respiratory: Negative.    Cardiovascular: Negative.    Gastrointestinal: Negative.    Genitourinary: Negative.    Musculoskeletal: Negative.    Skin: Negative.    Neurological: Negative.    Endo/Heme/Allergies: Negative.    Psychiatric/Behavioral: Negative.    All other systems reviewed and are negative.      Visit Vitals  BP 126/82   Pulse 82   Temp 98.2 ??F (36.8 ??C) (Temporal)   Ht 5' (1.524 m)   Wt 170 lb 6.4 oz (77.3 kg)   SpO2 99%   BMI 33.28 kg/m??        Physical Exam  Constitutional:       Appearance: She is well-developed.   HENT:      Head: Normocephalic.   Eyes:      Pupils: Pupils are equal, round, and reactive to light.   Neck:      Musculoskeletal: Normal range of motion.   Cardiovascular:      Rate and Rhythm: Normal rate and regular rhythm.      Heart sounds: No murmur. No friction rub. No gallop.    Pulmonary:  Effort: Pulmonary effort is normal.      Breath sounds: Normal breath sounds.   Abdominal:      General: Bowel sounds are normal.      Palpations: Abdomen is soft.   Musculoskeletal: Normal range of motion.   Skin:     General: Skin is warm and dry.   Neurological:      Mental Status: She is alert and oriented to person, place, and time.         Assessment & Plan:    1. Essential hypertension  Continue meds    2. Hypertriglyceridemia  Low carb diet and recheck lipids at return visit    3. Hypothyroidism, unspecified type  Continue med    4. Medicare annual wellness visit, subsequent     25  minutes face to face time spent with the patient; Greater than 50% counseling and/or coordination of care: the treatment regimen is extensive; detailed review. Recheck in 2-3 months, sooner for problems. This note was dictated using dragon voice recognition software.  It has been proofread, but there may still exist voice recognition errors that the author did not detect.      Signed By: Erskine SquibbSonya L Cothran-Pate, NP     February 09, 2019

## 2019-02-09 NOTE — Patient Instructions (Signed)
Medicare Wellness Visit, Female     The best way to live healthy is to have a lifestyle where you eat a well-balanced diet, exercise regularly, limit alcohol use, and quit all forms of tobacco/nicotine, if applicable.     Regular preventive services are another way to keep healthy. Preventive services (vaccines, screening tests, monitoring & exams) can help personalize your care plan, which helps you manage your own care. Screening tests can find health problems at the earliest stages, when they are easiest to treat.   [facility] follows the current, evidence-based guidelines published by the [place] [employer] (USPSTF) when recommending preventive services for our patients. Because we follow these guidelines, sometimes recommendations change over time as research supports it. (For example, mammograms used to be recommended annually. Even though Medicare will still pay for an annual mammogram, the newer guidelines recommend a mammogram every two years for women of average risk).  Of course, you and your doctor may decide to screen more often for some diseases, based on your risk and your co-morbidities (chronic disease you are already diagnosed with).     Preventive services for you include:  - Medicare offers their members a free annual wellness visit, which is time for you and your primary care provider to discuss and plan for your preventive service needs. Take advantage of this benefit every year!  -All adults over the age of 33 should receive the recommended pneumonia vaccines. Current USPSTF guidelines recommend a series of two vaccines for the best pneumonia protection.   -All adults should have a flu vaccine yearly and a tetanus vaccine every 10 years.   -All adults age 84 and older should receive the shingles vaccines (series of two vaccines).      -All adults age 68-70 who are overweight should have a diabetes screening test once every three years.    -All adults born between [birth year] and 1964 should be screened once for Hepatitis C.  -Other screening tests and preventive services for persons with diabetes include: an eye exam to screen for diabetic retinopathy, a kidney function test, a foot exam, and stricter control over your cholesterol.   -Cardiovascular screening for adults with routine risk involves an electrocardiogram (ECG) at intervals determined by your doctor.   -Colorectal cancer screenings should be done for adults age 87-75 with no increased risk factors for colorectal cancer.  There are a number of acceptable methods of screening for this type of cancer. Each test has its own benefits and drawbacks. Discuss with your doctor what is most appropriate for you during your annual wellness visit. The different tests include: colonoscopy (considered the best screening method), a fecal occult blood test, a fecal DNA test, and sigmoidoscopy.    -A bone mass density test is recommended when a woman turns 65 to screen for osteoporosis. This test is only recommended one time, as a screening. Some providers will use this same test as a disease monitoring tool if you already have osteoporosis.  -Breast cancer screenings are recommended every other year for women of normal risk, age 9-74.  -Cervical cancer screenings for women over age 98 are only recommended with certain risk factors.     Here is a list of your current Health Maintenance items (your personalized list of preventive services) with a due date:  Health Maintenance Due   Topic Date Due   ??? Pap Test  12/23/1977   ??? Annual Well Visit  07/23/2018

## 2019-02-09 NOTE — Progress Notes (Signed)
??  [facility]  [address]  [place], [place] [address]   (ph) [phone] (fax) [phone]  Bruchy Mikel L. Cothran-Pate APRN, FNP-C    This is the Subsequent Medicare Annual Wellness Exam, performed 12 months or more after the Initial AWV or the last Subsequent AWV    I have reviewed the patient's medical history in detail and updated the computerized patient record.     Depression Risk Factor Screening:     3 most recent PHQ Screens 02/09/2019   Little interest or pleasure in doing things Not at all   Feeling down, depressed, irritable, or hopeless Not at all   Total Score PHQ 2 0       Alcohol Risk Screen   Do you average more than 1 drink per night or more than 7 drinks a week:  No    On any one occasion in the past three months have you have had more than 3 drinks containing alcohol:  No        Functional Ability and Level of Safety:   Hearing: Hearing is good.     Activities of Daily Living:  The home contains: no safety equipment.  Patient does total self care     Ambulation: with no difficulty     Fall Risk:  Fall Risk Assessment, last 12 mths 07/23/2018   Able to walk? Yes   Fall in past 12 months? No     Abuse Screen:  Patient is not abused       Cognitive Screening   Has your family/caregiver stated any concerns about your memory: no     Cognitive Screening: Normal - Clock Drawing Test, Mini Cog Test    Assessment/Plan   Education and counseling provided:  Are appropriate based on today's review and evaluation    1. Medicare annual wellness visit, subsequent    2. Screening for Depression  PHQ2=0    3. Screening for alcoholism  Pt denies drinking    4. Screening immunizations  Immunizations UTD    5. Screening for Hep C  01/12/19- negative    6. Screening for colon cancer  Colonoscopy 11/27/18    7. Screening for breast cancer  Mammogram UTD-06/20    8. Screening for cervical cancer  Partial hysterectomy years ago; not due to cancer per pt.  Will schedule pap     9. Postmenopausal  Dexa scan 07/2018; due again 07/2020  Continue Calcium and Vitamin D daily    10. Routine eye exam  Pt scheduling appt.     Reviewed health care maintenance. All appropriate screenings ordered or discussed/offered that are recommended or due. Information provided on Advanced Directive,Living Will and POA with AVS. Pt has one on file. Pt to return for next Medicare Wellness in 1 year.      Health Maintenance Due     Health Maintenance Due   Topic Date Due   ??? PAP AKA CERVICAL CYTOLOGY  12/23/1977   ??? Medicare Yearly Exam  07/23/2018       Patient Care Team   Patient Care Team:  Cothran-Pate, Delfin Edis, NP as PCP - General (Nurse Practitioner)  Cothran-Pate, Delfin Edis, NP as PCP - [facility] Empaneled Provider  Bittrick, Karie Kirks, MD (Cardiology)    History     Patient Active Problem List   Diagnosis Code   ??? 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 Chest pain  R07.9   ??? Hypomagnesemia E83.42   ??? Altered mental status, unspecified R41.82   ??? Hx-TIA (transient ischemic attack) Z86.73   ??? Syncope, vasovagal R55   ??? Encounter for medication management Z79.899   ??? Lightheadedness R42     Past Medical History:   Diagnosis Date   ??? Arthritis    ??? Hypertension    ??? Thyroid disease     hypothyroid      Past Surgical History:   Procedure Laterality Date   ??? HX APPENDECTOMY     ??? HX CERVICAL DISKECTOMY  09/16/06    Anterior C5-C6 diskectomy with decompression;  anterior cervical arthrodesis interbody technique;  anterior cervical instrumentation;  tricortical iliac crest bone graft harvested through a separate incision.    ??? HX CHOLECYSTECTOMY     ??? HX TAH AND BSO       Current Outpatient Medications   Medication Sig Dispense Refill   ??? cyclobenzaprine (Flexeril) 10 mg tablet Take 1 Tab by mouth nightly. 30 Tab 2   ??? levothyroxine (SYNTHROID) 75 mcg tablet Take 1 Tab by mouth Daily (before breakfast). 30 Tab 2    ??? lisinopriL (PRINIVIL, ZESTRIL) 20 mg tablet Take 0.5 Tabs by mouth daily. 30 Tab 2   ??? metoprolol succinate (TOPROL-XL) 50 mg XL tablet Take 1 Tab by mouth daily. 90 Tab 3   ??? aspirin 81 mg chewable tablet Take  by mouth daily.     ??? multivitamin, tx-iron-ca-min (THERA-M w/ IRON) 9 mg iron-400 mcg tab tablet Take 1 Tab by mouth daily.       Allergies   Allergen Reactions   ??? Lipitor [Atorvastatin] Other (comments)     Muscle pain   ??? Lortab [Hydrocodone-Acetaminophen] Itching   ??? Penicillins Hives       Family History   Problem Relation Age of Onset   ??? Lung Disease Mother    ??? Asthma Mother    ??? Liver Disease Father    ??? Stroke Brother    ??? Diabetes Brother      Social History     Tobacco Use   ??? Smoking status: Former Smoker     Packs/day: 1.00     Years: 15.00     Pack years: 15.00     Last attempt to quit: 10/25/1996     Years since quitting: 22.3   ??? Smokeless tobacco: Never Used   Substance Use Topics   ??? Alcohol use: No

## 2019-03-17 ENCOUNTER — Encounter

## 2019-05-05 ENCOUNTER — Ambulatory Visit: Primary: Nurse Practitioner

## 2019-05-05 ENCOUNTER — Ambulatory Visit: Payer: PRIVATE HEALTH INSURANCE | Primary: Nurse Practitioner

## 2019-05-05 DIAGNOSIS — Z23 Encounter for immunization: Secondary | ICD-10-CM

## 2019-05-11 ENCOUNTER — Ambulatory Visit: Attending: Nurse Practitioner | Primary: Nurse Practitioner

## 2019-05-11 ENCOUNTER — Encounter: Attending: Nurse Practitioner | Primary: Nurse Practitioner

## 2019-05-11 ENCOUNTER — Ambulatory Visit
Admit: 2019-05-11 | Discharge: 2019-05-11 | Payer: PRIVATE HEALTH INSURANCE | Attending: Nurse Practitioner | Primary: Nurse Practitioner

## 2019-05-11 DIAGNOSIS — I10 Essential (primary) hypertension: Secondary | ICD-10-CM

## 2019-05-11 MED ORDER — LEVOTHYROXINE 75 MCG TAB
75 mcg | ORAL_TABLET | Freq: Every day | ORAL | 2 refills | Status: DC
Start: 2019-05-11 — End: 2019-08-11

## 2019-05-11 MED ORDER — LISINOPRIL 20 MG TAB
20 mg | ORAL_TABLET | Freq: Every day | ORAL | 2 refills | Status: DC
Start: 2019-05-11 — End: 2019-08-11

## 2019-05-11 MED ORDER — CYCLOBENZAPRINE 10 MG TAB
10 mg | ORAL_TABLET | Freq: Every evening | ORAL | 2 refills | Status: DC
Start: 2019-05-11 — End: 2020-01-19

## 2019-05-11 NOTE — Progress Notes (Signed)
Results to patient please.  Glucose is 96.  Total cholesterol is 178, triglycerides 166, HDL is 48, and LDL is 101.  What happened to her CBC?  It looks as if it was canceled.  Thyroid is normal.  Continue current meds keep scheduled follow-up.

## 2019-05-11 NOTE — Progress Notes (Signed)
Reviewed results and patient verbalizes understanding. Pt has lab appt scheduled to repeat CBC-hemolyzed sample.

## 2019-05-11 NOTE — Progress Notes (Signed)
Progress Notes by Erskine Squibbothran-Pate, Tamorah Hada L, NP at 05/11/19 0840                Author: Erskine Squibbothran-Pate, Reyce Lubeck L, NP  Service: --  Author Type: Nurse Practitioner       Filed: 05/11/19 0951  Encounter Date: 05/11/2019  Status: Signed          Editor: Erskine Squibbothran-Pate, Natasia Sanko L, NP (Nurse Practitioner)                                  [facility]   [address]   [place], [place] [id number]    (ph) [phone] (fax) [phone]   Sandra Pereyra L. Cothran-Pate APRN, FNP-C            64 year old female  comes in as a recheck to chronic issues and to get med refills. She never went to Weight [employer]  but has been cutting down on what she eats. She has had her mammogram, colonoscopy, and Dexa scan done. She denies smoking or any new issues. She is due a P&P and is scheduling this. She has had the Shingrix vaccine x 2 as well as the influenza vaccine.  Pt is requesting to go to another orthopedic physician outside of [facility]. [facility] and he did a cervical CT in 2017 and told her nothing else could be done to help her neck. She is requesting another opinion.    ??   ??           Allergies        Allergen  Reactions         ?  Lipitor [Atorvastatin]  Other (comments)             Muscle pain         ?  Lortab [Hydrocodone-Acetaminophen]  Itching         ?  Penicillins  Hives             Past Medical History:        Diagnosis  Date         ?  Arthritis       ?  Hypertension       ?  Thyroid disease            hypothyroid             Family History         Problem  Relation  Age of Onset          ?  Lung Disease  Mother       ?  Asthma  Mother       ?  Liver Disease  Father       ?  Stroke  Brother            ?  Diabetes  Brother               Social History          Socioeconomic History         ?  Marital status:  DIVORCED              Spouse name:  Not on file         ?  Number of children:  Not on file     ?  Years of education:  Not on  file     ?  Highest education level:  Not  on file       Occupational History        ?  Not on file       Social Needs         ?  Financial resource strain:  Not on file        ?  Food insecurity              Worry:  Not on file         Inability:  Not on file        ?  Transportation needs              Medical:  Not on file         Non-medical:  Not on file       Tobacco Use         ?  Smoking status:  Former Smoker              Packs/day:  1.00         Years:  15.00         Pack years:  15.00         Quit date:  10/25/1996         Years since quitting:  22.5         ?  Smokeless tobacco:  Never Used       Substance and Sexual Activity         ?  Alcohol use:  No     ?  Drug use:  No     ?  Sexual activity:  Not on file       Lifestyle        ?  Physical activity              Days per week:  Not on file         Minutes per session:  Not on file         ?  Stress:  Not on file       Relationships        ?  Social Health visitor on phone:  Not on file         Gets together:  Not on file         Attends religious service:  Not on file         Active member of club or organization:  Not on file         Attends meetings of clubs or organizations:  Not on file         Relationship status:  Not on file        ?  Intimate partner violence              Fear of current or ex partner:  Not on file         Emotionally abused:  Not on file         Physically abused:  Not on file         Forced sexual activity:  Not on file        Other Topics  Concern        ?  Not on file       Social History Narrative        ?  Not on file  OB History                Gravida      4                Para      4                Term                       Preterm                       AB                       Living                                    SAB                       TAB                       Ectopic                       Molar                       Multiple                       Live Births                                             Past Surgical History:          Procedure  Laterality  Date          ?  HX APPENDECTOMY         ?  HX CERVICAL DISKECTOMY    09/16/06          Anterior C5-C6 diskectomy with decompression;  anterior cervical arthrodesis interbody technique;  anterior cervical instrumentation;  tricortical  iliac crest bone graft harvested through a separate incision.           ?  HX CHOLECYSTECTOMY              ?  HX TAH AND BSO                 Health Maintenance        Topic  Date Due         ?  PAP AKA CERVICAL CYTOLOGY   Never done     ?  COVID-19 Vaccine (2 - Pfizer 2-dose series)  05/26/2019     ?  Breast Cancer Screen Mammogram   08/07/2020     ?  DTaP/Tdap/Td series (2 - Td)  09/26/2022     ?  Colorectal Cancer Screening Combo   11/27/2023     ?  Lipid Screen   01/12/2024     ?  Hepatitis C Screening   Completed     ?  Bone Densitometry (Dexa) Screening   Completed     ?  Shingrix Vaccine Age 45>   Completed     ?  Flu  Vaccine   Completed         ?  Pneumococcal 0-64 years   Aged Out              Current Outpatient Medications:    ?  levothyroxine (SYNTHROID) 75 mcg tablet, Take 1 Tab by mouth Daily (before breakfast)., Disp: 30 Tab, Rfl: 2   ?  cyclobenzaprine (Flexeril) 10 mg tablet, Take 1 Tab by mouth nightly., Disp: 30 Tab, Rfl: 2   ?  lisinopriL (PRINIVIL, ZESTRIL) 20 mg tablet, Take 0.5 Tabs by mouth daily., Disp: 30 Tab, Rfl: 2   ?  metoprolol succinate (TOPROL-XL) 50 mg XL tablet, Take 1 Tab by mouth daily., Disp: 90 Tab, Rfl: 3   ?  aspirin 81 mg chewable tablet, Take  by mouth daily., Disp: , Rfl:    ?  multivitamin, tx-iron-ca-min (THERA-M w/ IRON) 9 mg iron-400 mcg tab tablet, Take 1 Tab by mouth daily., Disp: , Rfl:       ROS    Constitutional: Negative.     HENT: Negative.     Eyes: Negative.     Respiratory: Negative.     Cardiovascular: Negative.     Gastrointestinal: Negative.     Genitourinary: Negative.     Musculoskeletal: Negative.          Muscle spasms at night (back)    Neck spasms   Skin: Negative.     Neurological:  Negative.     Endo/Heme/Allergies: Negative.     Psychiatric/Behavioral: Negative.     All other systems reviewed and are negative.   ??         Visit Vitals      BP  118/70     Pulse  94     Temp  (!) 96.6 ??F (35.9 ??C) (Temporal)     Ht  5' (1.524 m)     Wt  171 lb (77.6 kg)     SpO2  99%        BMI  33.40 kg/m??            Physical Exam   Constitutional :        Appearance: She is well-developed.   HENT :       Head: Normocephalic.   Eyes :       Pupils: Pupils are equal, round, and reactive to light.   Neck :       Musculoskeletal: Normal range of motion.   Cardiovascular :       Rate and Rhythm: Normal rate and regular rhythm.      Heart sounds: No murmur. No friction rub. No gallop.     Pulmonary:       Effort: Pulmonary effort is normal.      Breath sounds: Normal breath sounds.   Abdominal :      General: Bowel sounds are normal.      Palpations: Abdomen is soft.     Musculoskeletal: Normal range of motion.    Skin:      General: Skin is warm and dry.   Neurological :       Mental Status: She is alert and oriented to person, place, and time.         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 This appears to be more towards the left and anteriorly. The   degree of impingement is difficult to  ascertain because of the large amount of   metallic artifact.         Assessment & Plan:      1. Essential hypertension   - lisinopriL (PRINIVIL, ZESTRIL) 20 mg tablet; Take 0.5 Tabs by mouth daily.  Dispense: 30 Tab; Refill: 2   - METABOLIC PANEL, COMPREHENSIVE   - CBC WITH AUTOMATED DIFF      2. Hypothyroidism, unspecified type   - levothyroxine (SYNTHROID) 75 mcg tablet; Take 1 Tab by mouth Daily (before breakfast).  Dispense: 30 Tab; Refill: 2   - TSH AND FREE T4      3. Night muscle spasms      - cyclobenzaprine (Flexeril) 10 mg tablet; Take 1 Tab by mouth nightly.  Dispense: 30 Tab; Refill: 2      4. Hypertriglyceridemia   - LIPID PANEL WITH LDL/HDL RATIO       5. Breast screening      - MAM 3D TOMO W MAMMO BI DX INCL CAD; Future      6. Cervical pain   - REFERRAL TO ORTHOPEDICS ([facility])       Greater than 50% counseling and/or coordination of care: the treatment regimen is extensive; detailed review. Will notify of labs. P&P is being arranged. This note was dictated using dragon voice recognition software.  It has been proofread, but there  may still exist voice recognition errors that the author did not detect.            Signed By:  Erskine Squibb, NP           May 11, 2019

## 2019-05-11 NOTE — Progress Notes (Signed)
Reviewed results and patient verbalizes understanding. Pt has lab appt scheduled to repeat CBC-hemolyzed sample.

## 2019-05-11 NOTE — Progress Notes (Signed)
[facility]  [address]  [place], [place] [address]   (ph) [phone] (fax) [phone]  Sandra Krise L. Cothran-Pate APRN, FNP-C        64 year old female  comes in as a recheck to chronic issues and to get med refills. She never went to Weight Watchers but has been cutting down on what she eats. She has had her mammogram, colonoscopy, and Dexa scan done. She denies smoking or any new issues. She is due a P&P and is scheduling this. She has had the Shingrix vaccine x 2 as well as the influenza vaccine. Pt is requesting to go to another orthopedic physician outside of [facility]. She was seeing Dr. Billey Chang at [facility] and he did a cervical CT in 2017 and told her nothing else could be done to help her neck. She is requesting another opinion.   ??  ??      Allergies   Allergen Reactions   ??? Lipitor [Atorvastatin] Other (comments)     Muscle pain   ??? Lortab [Hydrocodone-Acetaminophen] Itching   ??? Penicillins Hives       Past Medical History:   Diagnosis Date   ??? Arthritis    ??? Hypertension    ??? Thyroid disease     hypothyroid       Family History   Problem Relation Age of Onset   ??? Lung Disease Mother    ??? Asthma Mother    ??? Liver Disease Father    ??? Stroke Brother    ??? Diabetes Brother        Social History     Socioeconomic History   ??? Marital status: DIVORCED     Spouse name: Not on file   ??? Number of children: Not on file   ??? Years of education: Not on file   ??? Highest education level: Not on file   Occupational History   ??? Not on file   Social Needs   ??? Financial resource strain: Not on file   ??? Food insecurity     Worry: Not on file     Inability: Not on file   ??? Transportation needs     Medical: Not on file     Non-medical: Not on file   Tobacco Use   ??? Smoking status: Former Smoker     Packs/day: 1.00     Years: 15.00     Pack years: 15.00     Quit date: 10/25/1996     Years since quitting: 22.5   ??? Smokeless tobacco: Never Used   Substance and Sexual Activity   ??? Alcohol use:  No   ??? Drug use: No   ??? Sexual activity: Not on file   Lifestyle   ??? Physical activity     Days per week: Not on file     Minutes per session: Not on file   ??? Stress: Not on file   Relationships   ??? Social Wellsite geologist on phone: Not on file     Gets together: Not on file     Attends religious service: Not on file     Active member of club or organization: Not on file     Attends meetings of clubs or organizations: Not on file     Relationship status: Not on file   ??? Intimate partner violence     Fear of current or ex partner: Not on file  Emotionally abused: Not on file     Physically abused: Not on file     Forced sexual activity: Not on file   Other Topics Concern   ??? Not on file   Social History Narrative   ??? Not on file       OB History     Gravida   4    Para   4    Term        Preterm        AB        Living           SAB        TAB        Ectopic        Molar        Multiple        Live Births                    Past Surgical History:   Procedure Laterality Date   ??? HX APPENDECTOMY     ??? HX CERVICAL DISKECTOMY  09/16/06    Anterior C5-C6 diskectomy with decompression;  anterior cervical arthrodesis interbody technique;  anterior cervical instrumentation;  tricortical iliac crest bone graft harvested through a separate incision.    ??? HX CHOLECYSTECTOMY     ??? HX TAH AND BSO         Health Maintenance   Topic Date Due   ??? PAP AKA CERVICAL CYTOLOGY  Never done   ??? COVID-19 Vaccine (2 - Pfizer 2-dose series) 05/26/2019   ??? Breast Cancer Screen Mammogram  08/07/2020   ??? DTaP/Tdap/Td series (2 - Td) 09/26/2022   ??? Colorectal Cancer Screening Combo  11/27/2023   ??? Lipid Screen  01/12/2024   ??? Hepatitis C Screening  Completed   ??? Bone Densitometry (Dexa) Screening  Completed   ??? Shingrix Vaccine Age 71>  Completed   ??? Flu Vaccine  Completed   ??? Pneumococcal 0-64 years  Aged Out         Current Outpatient Medications:   ???  levothyroxine (SYNTHROID) 75 mcg tablet, Take 1 Tab by mouth Daily (before  breakfast)., Disp: 30 Tab, Rfl: 2  ???  cyclobenzaprine (Flexeril) 10 mg tablet, Take 1 Tab by mouth nightly., Disp: 30 Tab, Rfl: 2  ???  lisinopriL (PRINIVIL, ZESTRIL) 20 mg tablet, Take 0.5 Tabs by mouth daily., Disp: 30 Tab, Rfl: 2  ???  metoprolol succinate (TOPROL-XL) 50 mg XL tablet, Take 1 Tab by mouth daily., Disp: 90 Tab, Rfl: 3  ???  aspirin 81 mg chewable tablet, Take  by mouth daily., Disp: , Rfl:   ???  multivitamin, tx-iron-ca-min (THERA-M w/ IRON) 9 mg iron-400 mcg tab tablet, Take 1 Tab by mouth daily., Disp: , Rfl:     ROS   Constitutional: Negative.    HENT: Negative.    Eyes: Negative.    Respiratory: Negative.    Cardiovascular: Negative.    Gastrointestinal: Negative.    Genitourinary: Negative.    Musculoskeletal: Negative.         Muscle spasms at night (back)   Neck spasms  Skin: Negative.    Neurological: Negative.    Endo/Heme/Allergies: Negative.    Psychiatric/Behavioral: Negative.    All other systems reviewed and are negative.  ??      Visit Vitals  BP 118/70   Pulse 94   Temp (!) 96.6 ??F (35.9 ??C) (Temporal)   Ht 5' (1.524  m)   Wt 171 lb (77.6 kg)   SpO2 99%   BMI 33.40 kg/m??        Physical Exam  Constitutional:       Appearance: She is well-developed.   HENT:      Head: Normocephalic.   Eyes:      Pupils: Pupils are equal, round, and reactive to light.   Neck:      Musculoskeletal: Normal range of motion.   Cardiovascular:      Rate and Rhythm: Normal rate and regular rhythm.      Heart sounds: No murmur. No friction rub. No gallop.    Pulmonary:      Effort: Pulmonary effort is normal.      Breath sounds: Normal breath sounds.   Abdominal:      General: Bowel sounds are normal.      Palpations: Abdomen is soft.   Musculoskeletal: Normal range of motion.   Skin:     General: Skin is warm and dry.   Neurological:      Mental Status: She is alert and oriented to person, place, and time.       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 This  appears to be more towards the left and anteriorly. The  degree of impingement is difficult to ascertain because of the large amount of  metallic artifact.      Assessment & Plan:    1. Essential hypertension  - lisinopriL (PRINIVIL, ZESTRIL) 20 mg tablet; Take 0.5 Tabs by mouth daily.  Dispense: 30 Tab; Refill: 2  - METABOLIC PANEL, COMPREHENSIVE  - CBC WITH AUTOMATED DIFF    2. Hypothyroidism, unspecified type  - levothyroxine (SYNTHROID) 75 mcg tablet; Take 1 Tab by mouth Daily (before breakfast).  Dispense: 30 Tab; Refill: 2  - TSH AND FREE T4    3. Night muscle spasms    - cyclobenzaprine (Flexeril) 10 mg tablet; Take 1 Tab by mouth nightly.  Dispense: 30 Tab; Refill: 2    4. Hypertriglyceridemia  - LIPID PANEL WITH LDL/HDL RATIO    5. Breast screening    - MAM 3D TOMO W MAMMO BI DX INCL CAD; Future    6. Cervical pain  - REFERRAL TO ORTHOPEDICS ([facility])     Greater than 50% counseling and/or coordination of care: the treatment regimen is extensive; detailed review. Will notify of labs. P&P is being arranged. This note was dictated using dragon voice recognition software.  It has been proofread, but there may still exist voice recognition errors that the author did not detect.      Signed By: Dorthula Matas, NP     May 11, 2019

## 2019-05-11 NOTE — Progress Notes (Signed)
Results to patient please.  Glucose is 96.  Total cholesterol is 178, triglycerides 166, HDL is 48, and LDL is 101.  What happened to her CBC?  It looks as if it was canceled.  Thyroid is normal.  Continue current meds keep scheduled follow-up.

## 2019-05-12 LAB — METABOLIC PANEL, COMPREHENSIVE
A-G Ratio: 1.8 (ref 1.2–2.2)
ALT (SGPT): 20 IU/L (ref 0–32)
AST (SGOT): 17 IU/L (ref 0–40)
Albumin: 4.1 g/dL (ref 3.8–4.8)
Alk. phosphatase: 79 IU/L (ref 39–117)
BUN/Creatinine ratio: 19 (ref 12–28)
BUN: 11 mg/dL (ref 8–27)
Bilirubin, total: 0.3 mg/dL (ref 0.0–1.2)
CO2: 20 mmol/L (ref 20–29)
Calcium: 9.3 mg/dL (ref 8.7–10.3)
Chloride: 110 mmol/L — ABNORMAL HIGH (ref 96–106)
Creatinine: 0.58 mg/dL (ref 0.57–1.00)
GFR est AA: 114 mL/min/{1.73_m2} (ref >59)
GFR est non-AA: 99 mL/min/{1.73_m2} (ref >59)
GLOBULIN, TOTAL: 2.3 g/dL (ref 1.5–4.5)
Glucose: 96 mg/dL (ref 65–99)
Potassium: 4.5 mmol/L (ref 3.5–5.2)
Protein, total: 6.4 g/dL (ref 6.0–8.5)
Sodium: 142 mmol/L (ref 134–144)

## 2019-05-12 LAB — LIPID PANEL WITH LDL/HDL RATIO
Cholesterol, Total: 178 mg/dL (ref 100–199)
Cholesterol, total: 178 mg/dL (ref 100–199)
HDL Cholesterol: 48 mg/dL (ref >39)
HDL: 48 mg/dL (ref >39)
LDL Calculated: 101 mg/dL — ABNORMAL HIGH (ref 0–99)
LDL, calculated: 101 mg/dL — ABNORMAL HIGH (ref 0–99)
LDL/HDL Ratio: 2.1 ratio (ref 0.0–3.2)
LDL/HDL Ratio: 2.1 ratio (ref 0.0–3.2)
Triglyceride: 166 mg/dL — ABNORMAL HIGH (ref 0–149)
Triglycerides: 166 mg/dL — ABNORMAL HIGH (ref 0–149)
VLDL, calculated: 29 mg/dL (ref 5–40)
VLDL: 29 mg/dL (ref 5–40)

## 2019-05-12 LAB — CBC WITH AUTOMATED DIFF

## 2019-05-12 LAB — TSH AND FREE T4
T4, Free: 0.87 ng/dL (ref 0.82–1.77)
TSH: 2.57 u[IU]/mL (ref 0.450–4.500)

## 2019-05-12 LAB — COMPREHENSIVE METABOLIC PANEL
ALT: 20 IU/L (ref 0–32)
AST: 17 IU/L (ref 0–40)
Albumin/Globulin Ratio: 1.8 NA (ref 1.2–2.2)
Albumin: 4.1 g/dL (ref 3.8–4.8)
Alkaline Phosphatase: 79 IU/L (ref 39–117)
BUN: 11 mg/dL (ref 8–27)
Bun/Cre Ratio: 19 NA (ref 12–28)
CO2: 20 mmol/L (ref 20–29)
Calcium: 9.3 mg/dL (ref 8.7–10.3)
Chloride: 110 mmol/L — ABNORMAL HIGH (ref 96–106)
Creatinine: 0.58 mg/dL (ref 0.57–1.00)
EGFR IF NonAfrican American: 99 mL/min/{1.73_m2} (ref >59)
GFR African American: 114 mL/min/{1.73_m2} (ref >59)
Globulin, Total: 2.3 g/dL (ref 1.5–4.5)
Glucose: 96 mg/dL (ref 65–99)
Potassium: 4.5 mmol/L (ref 3.5–5.2)
Sodium: 142 mmol/L (ref 134–144)
Total Bilirubin: 0.3 mg/dL (ref 0.0–1.2)
Total Protein: 6.4 g/dL (ref 6.0–8.5)

## 2019-05-12 LAB — CBC WITH AUTO DIFFERENTIAL

## 2019-05-12 LAB — TSH + FREE T4 PANEL
T4 Free: 0.87 ng/dL (ref 0.82–1.77)
TSH: 2.57 u[IU]/mL (ref 0.450–4.500)

## 2019-05-17 ENCOUNTER — Encounter

## 2019-05-19 ENCOUNTER — Other Ambulatory Visit: Admit: 2019-05-19 | Discharge: 2019-05-19 | Payer: PRIVATE HEALTH INSURANCE | Primary: Nurse Practitioner

## 2019-05-19 DIAGNOSIS — R42 Dizziness and giddiness: Secondary | ICD-10-CM

## 2019-05-19 NOTE — Progress Notes (Signed)
Patient notified and verbalized understanding

## 2019-05-19 NOTE — Progress Notes (Signed)
Results to patient please.  CBC looks good.

## 2019-05-19 NOTE — Progress Notes (Signed)
Results to patient please.  CBC looks good.

## 2019-05-20 LAB — CBC WITH AUTOMATED DIFF
ABS. BASOPHILS: 0 10*3/uL (ref 0.0–0.2)
ABS. EOSINOPHILS: 0.2 10*3/uL (ref 0.0–0.4)
ABS. IMM. GRANS.: 0 10*3/uL (ref 0.0–0.1)
ABS. MONOCYTES: 0.6 10*3/uL (ref 0.1–0.9)
ABS. NEUTROPHILS: 1.8 10*3/uL (ref 1.4–7.0)
Abs Lymphocytes: 3.6 10*3/uL — ABNORMAL HIGH (ref 0.7–3.1)
BASOPHILS: 1 %
EOSINOPHILS: 4 %
HCT: 41.4 % (ref 34.0–46.6)
HGB: 14.2 g/dL (ref 11.1–15.9)
IMMATURE GRANULOCYTES: 0 %
Lymphocytes: 58 %
MCH: 32.1 pg (ref 26.6–33.0)
MCHC: 34.3 g/dL (ref 31.5–35.7)
MCV: 94 fL (ref 79–97)
MONOCYTES: 9 %
NEUTROPHILS: 28 %
PLATELET: 256 10*3/uL (ref 150–450)
RBC: 4.43 x10E6/uL (ref 3.77–5.28)
RDW: 12.8 % (ref 11.7–15.4)
WBC: 6.2 10*3/uL (ref 3.4–10.8)

## 2019-05-20 LAB — CBC WITH AUTO DIFFERENTIAL
Basophils %: 1 %
Basophils Absolute: 0 10*3/uL (ref 0.0–0.2)
Eosinophils %: 4 %
Eosinophils Absolute: 0.2 10*3/uL (ref 0.0–0.4)
Granulocyte Absolute Count: 0 10*3/uL (ref 0.0–0.1)
Hematocrit: 41.4 % (ref 34.0–46.6)
Hemoglobin: 14.2 g/dL (ref 11.1–15.9)
Immature Granulocytes: 0 %
Lymphocytes %: 58 %
Lymphocytes Absolute: 3.6 10*3/uL — ABNORMAL HIGH (ref 0.7–3.1)
MCH: 32.1 pg (ref 26.6–33.0)
MCHC: 34.3 g/dL (ref 31.5–35.7)
MCV: 94 fL (ref 79–97)
Monocytes %: 9 %
Monocytes Absolute: 0.6 10*3/uL (ref 0.1–0.9)
Neutrophils %: 28 %
Neutrophils Absolute: 1.8 10*3/uL (ref 1.4–7.0)
Platelets: 256 10*3/uL (ref 150–450)
RBC: 4.43 x10E6/uL (ref 3.77–5.28)
RDW: 12.8 % (ref 11.7–15.4)
WBC: 6.2 10*3/uL (ref 3.4–10.8)

## 2019-05-26 ENCOUNTER — Ambulatory Visit: Primary: Nurse Practitioner

## 2019-05-26 ENCOUNTER — Ambulatory Visit: Payer: PRIVATE HEALTH INSURANCE | Primary: Nurse Practitioner

## 2019-05-26 DIAGNOSIS — Z23 Encounter for immunization: Secondary | ICD-10-CM

## 2019-06-14 ENCOUNTER — Ambulatory Visit: Attending: Surgical | Primary: Nurse Practitioner

## 2019-06-14 ENCOUNTER — Encounter: Admit: 2019-06-14 | Payer: PRIVATE HEALTH INSURANCE | Primary: Nurse Practitioner

## 2019-06-14 ENCOUNTER — Ambulatory Visit
Admit: 2019-06-14 | Discharge: 2019-06-14 | Payer: PRIVATE HEALTH INSURANCE | Attending: Surgical | Primary: Nurse Practitioner

## 2019-06-14 DIAGNOSIS — M542 Cervicalgia: Secondary | ICD-10-CM

## 2019-06-14 MED ORDER — PREDNISONE 5 MG TAB
5 mg | ORAL_TABLET | ORAL | 0 refills | Status: AC
Start: 2019-06-14 — End: 2019-06-26

## 2019-06-14 NOTE — Progress Notes (Signed)
Name: Sandra Harmon  Date of Birth: 28-Nov-1955  Gender: female  MRN: [id number]    Chief complaint: Neck Pain (pain radiating into right upper extremity)       History of present illness:    This is a very pleasant 64 y.o. old female who presents with a right neck periscapular pain with numbness and tingling right 4/5 fingers.. She had a previous surgery with Dr. Gretta Cool believe it was a C5-6 ACDF, but then [place] was [place] and had a C4-5 and C6-7 ACDF with these metal spacers done and that was in 2014.  She has had some problems with her neck since that surgery and she did see Dr. Gretta Cool in 2017 he ordered a myelogram CT scan we did not see any significant neurological compression at that time.  She has been dealing with neck pain but it has become more prominent over the past 6 months.  She is having tingling in the right fourth and fifth fingers.  There is right parascapular pain.  She does have some left neck pain as well.  She is not had any treatment to address this.  Historically physical therapy exacerbates her neck pain.    There have nota been associated changes in fine motor skills such as handwriting and buttoning buttons. There have not been changes in gait since the onset of the symptoms. The patient has had cervical surgery in the past.    Thus far, efforts to address the pain have included NSAIDS.     Pain Assessment  06/14/2019   Location of Pain Neck   Location Modifiers Medial   Severity of Pain 5   Quality of Pain Burning   Duration of Pain Persistent   Frequency of Pain Intermittent   Date Pain First Started 04/16/2019   Aggravating Factors (No Data)   Aggravating Factors Comment sleeping   Limiting Behavior Yes   Relieving Factors Nothing   Result of Injury No        ROS/Meds/PSH/PMH/FH/SH: I personally reviewed the patient's collected intake data.  Below are the pertinents:    Allergies   Allergen Reactions   ??? Lipitor [Atorvastatin] Other (comments)     Muscle pain   ??? Lortab  [Hydrocodone-Acetaminophen] Itching   ??? Penicillins Hives         Current Outpatient Medications:   ???  predniSONE (DELTASONE) 5 mg tablet, Take 1 Tab by mouth Follow dosing instructions for 12 days., Disp: 48 Tab, Rfl: 0  ???  levothyroxine (SYNTHROID) 75 mcg tablet, Take 1 Tab by mouth Daily (before breakfast)., Disp: 30 Tab, Rfl: 2  ???  cyclobenzaprine (Flexeril) 10 mg tablet, Take 1 Tab by mouth nightly., Disp: 30 Tab, Rfl: 2  ???  lisinopriL (PRINIVIL, ZESTRIL) 20 mg tablet, Take 0.5 Tabs by mouth daily., Disp: 30 Tab, Rfl: 2  ???  aspirin 81 mg chewable tablet, Take  by mouth daily., Disp: , Rfl:   ???  multivitamin, tx-iron-ca-min (THERA-M w/ IRON) 9 mg iron-400 mcg tab tablet, Take 1 Tab by mouth daily., Disp: , Rfl:   ???  metoprolol succinate (TOPROL-XL) 50 mg XL tablet, Take 1 Tab by mouth daily., Disp: 90 Tab, Rfl: 3    Past Medical History:   Diagnosis Date   ??? Arthritis    ??? Hypertension    ??? Thyroid disease     hypothyroid       Tobacco:  reports that she quit smoking about 22 years ago. She has a 15.00  pack-year smoking history. She has never used smokeless tobacco.  Alcohol:   Social History     Substance and Sexual Activity   Alcohol Use No        Physical Exam:     GENERAL:  Adult in no acute distress, well developed, well nourished . Patient is appropriately conversant  CERVICAL SPINE:  Inspection of the neck reveals no evidence of rash or skin lesion.  Examination of the cervical spine reveals no evidence of sagittal or coronal plane deformity, no palpable tenderness or step deformity and decreased ROM  Spurling's sign painful but negative side for reproduction of radicular symptoms.    Gait does not suggest myelopathy.    Sensory testing reveals intact sensation to light touch in the distribution of the C5-T1 dermatomes bilaterally.    Reflexes     Right Left   Biceps (C5) 2 2   Brachio radialis (C6) 2 2    Triceps (C7) 2 2     Hoffman's is mild positive bilaterally  Ankle jerk is negative   Finger escape  test is negative  Inverted radial reflex is negative    Tinel's and Durkin testing over the right cubital tunnel does t reproduce the symptoms.    Shoulder examination is not consistent with adhesive capsulitis or acute rotator cuff tendinitis.    The patient does not have difficulty with rapid alternating hand movements.    Strength testing in the upper extremity reveals the following based on the 5 point grading scale:     Delt(C5) Bicep(C6) WE(C6) Tricep (C7) WF(C7) Grip(C8) Int (T1)   Right 5 5 5 5 5 5 5    Left 5 5 5 5 5 5 5      Pulses are palpable over bilateral radial arteries.    Radiographic Studies:  Prior ACDF C4-5 with instrumentation and then see 6 7 with instrumentation.  Instrumentation is intact.  There is adjacent level degenerative changes.  No fractures noted.  X-ray impression: Prior cervical instrumentation with adjacent level degenerative changes      Assessment/Plan:      ICD-10-CM ICD-9-CM    1. Neck pain  M54.2 723.1 XR SPINE CERV PA LAT ODONT 3 V MAX      CANCELED: REFERRAL TO PAIN MANAGEMENT   2. Status post cervical arthrodesis  Z98.1 V45.4 CANCELED: REFERRAL TO PAIN MANAGEMENT   3. DDD (degenerative disc disease), cervical  M50.30 722.4 CANCELED: REFERRAL TO PAIN MANAGEMENT   4. Cervical radiculopathy  M54.12 723.4 CANCELED: REFERRAL TO PAIN MANAGEMENT   5. Ulnar neuropathy of right upper extremity  G56.21 354.2 CANCELED: REFERRAL TO PAIN MANAGEMENT        This patient's clinical history and physical exam is consistent with spondylotic and radicular neck pain.  Also believes she may be having a component of double crush syndrome and possibly some ulnar nerve pathology as well.    I discussed the natural history of this condition with the patient in that often these patients will experience diminishing of their symptoms within several weeks of conservative care.  We also discussed that the typical conservative treatments available include rest, NSAIDs, muscle relaxants, and physical  therapy as symptoms allow.  Oral and/or injectable steroids are other options to consider.    Some consideration could be given to a soft collar for a short period of time.  However, I would avoid using this for extended periods because of further deconditioning of the paracervical musculature.    -----Steroids: A short oral steroid  taper was prescribed to try to bring the more acute symptoms under control. The patient understands that there are risks associated with steroids including elevated blood sugar, hypertension, increased risk of infections, and thinning of the bones if taken repeatedly or for prolonged periods. The taper can be followed by NSAIDs once completed  -----EMG/NCS:  The patient will be referred for electrodiagnostic testing to include EMG and NCS of the upper extremity to further delineate the cervical versus peripheral involvement and also to confirm the specific cervical myotome(s) involved.    Symptoms persist, we would need to do further imaging of the cervical spine likely with another myelogram CT scan.  Unfortunately the metal spacers do create artifact even with a CT scan.    Orders Placed This Encounter   ??? XR SPINE CERV PA LAT ODONT 3 V MAX   ??? predniSONE (DELTASONE) 5 mg tablet        Follow-up and Dispositions    ?? Return for after EMG/NCS.          Hurshel Party, PA  06/14/19  4:16 PM

## 2019-06-14 NOTE — Addendum Note (Signed)
Addendum Note  by Faustino Congress E at 06/14/19 1500                Author: Faustino Congress E  Service: --  Author Type: Medical Assistant       Filed: 06/24/19 1518  Encounter Date: 06/14/2019  Status: Signed          Editor: Kary Kos (Medical Assistant)          Addended by: Faustino Congress E on: 06/24/2019 03:18 PM    Modules accepted: Orders

## 2019-06-14 NOTE — Addendum Note (Signed)
Addended by: Faustino Congress E on: 06/24/2019 03:18 PM     Modules accepted: Orders

## 2019-06-14 NOTE — Progress Notes (Signed)
Name: Sandra Harmon  Date of Birth: 28-Nov-1955  Gender: female  MRN: [id number]    Chief complaint: Neck Pain (pain radiating into right upper extremity)       History of present illness:    This is a very pleasant 64 y.o. old female who presents with a right neck periscapular pain with numbness and tingling right 4/5 fingers.. She had a previous surgery with Dr. Gretta Cool believe it was a C5-6 ACDF, but then she was out of [place] and had a C4-5 and C6-7 ACDF with these metal spacers done and that was in 2014.  She has had some problems with her neck since that surgery and she did see Dr. Gretta Cool in 2017 he ordered a myelogram CT scan we did not see any significant neurological compression at that time.  She has been dealing with neck pain but it has become more prominent over the past 6 months.  She is having tingling in the right fourth and fifth fingers.  There is right parascapular pain.  She does have some left neck pain as well.  She is not had any treatment to address this.  Historically physical therapy exacerbates her neck pain.    There have nota been associated changes in fine motor skills such as handwriting and buttoning buttons. There have not been changes in gait since the onset of the symptoms. The patient has had cervical surgery in the past.    Thus far, efforts to address the pain have included NSAIDS.     Pain Assessment  06/14/2019   Location of Pain Neck   Location Modifiers Medial   Severity of Pain 5   Quality of Pain Burning   Duration of Pain Persistent   Frequency of Pain Intermittent   Date Pain First Started 04/16/2019   Aggravating Factors (No Data)   Aggravating Factors Comment sleeping   Limiting Behavior Yes   Relieving Factors Nothing   Result of Injury No        ROS/Meds/PSH/PMH/FH/SH: I personally reviewed the patient's collected intake data.  Below are the pertinents:    Allergies   Allergen Reactions   ??? Lipitor [Atorvastatin] Other (comments)     Muscle pain   ??? Lortab  [Hydrocodone-Acetaminophen] Itching   ??? Penicillins Hives         Current Outpatient Medications:   ???  predniSONE (DELTASONE) 5 mg tablet, Take 1 Tab by mouth Follow dosing instructions for 12 days., Disp: 48 Tab, Rfl: 0  ???  levothyroxine (SYNTHROID) 75 mcg tablet, Take 1 Tab by mouth Daily (before breakfast)., Disp: 30 Tab, Rfl: 2  ???  cyclobenzaprine (Flexeril) 10 mg tablet, Take 1 Tab by mouth nightly., Disp: 30 Tab, Rfl: 2  ???  lisinopriL (PRINIVIL, ZESTRIL) 20 mg tablet, Take 0.5 Tabs by mouth daily., Disp: 30 Tab, Rfl: 2  ???  aspirin 81 mg chewable tablet, Take  by mouth daily., Disp: , Rfl:   ???  multivitamin, tx-iron-ca-min (THERA-M w/ IRON) 9 mg iron-400 mcg tab tablet, Take 1 Tab by mouth daily., Disp: , Rfl:   ???  metoprolol succinate (TOPROL-XL) 50 mg XL tablet, Take 1 Tab by mouth daily., Disp: 90 Tab, Rfl: 3    Past Medical History:   Diagnosis Date   ??? Arthritis    ??? Hypertension    ??? Thyroid disease     hypothyroid       Tobacco:  reports that she quit smoking about 22 years ago. She has a 15.00  pack-year smoking history. She has never used smokeless tobacco.  Alcohol:   Social History     Substance and Sexual Activity   Alcohol Use No        Physical Exam:     GENERAL:  Adult in no acute distress, well developed, well nourished . Patient is appropriately conversant  CERVICAL SPINE:  Inspection of the neck reveals no evidence of rash or skin lesion.  Examination of the cervical spine reveals no evidence of sagittal or coronal plane deformity, no palpable tenderness or step deformity and decreased ROM  Spurling's sign painful but negative side for reproduction of radicular symptoms.    Gait does not suggest myelopathy.    Sensory testing reveals intact sensation to light touch in the distribution of the C5-T1 dermatomes bilaterally.    Reflexes     Right Left   Biceps (C5) 2 2   Brachio radialis (C6) 2 2    Triceps (C7) 2 2     Hoffman's is mild positive bilaterally  Ankle jerk is negative   Finger escape  test is negative  Inverted radial reflex is negative    Tinel's and Durkin testing over the right cubital tunnel does t reproduce the symptoms.    Shoulder examination is not consistent with adhesive capsulitis or acute rotator cuff tendinitis.    The patient does not have difficulty with rapid alternating hand movements.    Strength testing in the upper extremity reveals the following based on the 5 point grading scale:     Delt(C5) Bicep(C6) WE(C6) Tricep (C7) WF(C7) Grip(C8) Int (T1)   Right 5 5 5 5 5 5 5   Left 5 5 5 5 5 5 5     Pulses are palpable over bilateral radial arteries.    Radiographic Studies:  Prior ACDF C4-5 with instrumentation and then see 6 7 with instrumentation.  Instrumentation is intact.  There is adjacent level degenerative changes.  No fractures noted.  X-ray impression: Prior cervical instrumentation with adjacent level degenerative changes      Assessment/Plan:      ICD-10-CM ICD-9-CM    1. Neck pain  M54.2 723.1 XR SPINE CERV PA LAT ODONT 3 V MAX      CANCELED: REFERRAL TO PAIN MANAGEMENT   2. Status post cervical arthrodesis  Z98.1 V45.4 CANCELED: REFERRAL TO PAIN MANAGEMENT   3. DDD (degenerative disc disease), cervical  M50.30 722.4 CANCELED: REFERRAL TO PAIN MANAGEMENT   4. Cervical radiculopathy  M54.12 723.4 CANCELED: REFERRAL TO PAIN MANAGEMENT   5. Ulnar neuropathy of right upper extremity  G56.21 354.2 CANCELED: REFERRAL TO PAIN MANAGEMENT        This patient's clinical history and physical exam is consistent with spondylotic and radicular neck pain.  Also believes she may be having a component of double crush syndrome and possibly some ulnar nerve pathology as well.    I discussed the natural history of this condition with the patient in that often these patients will experience diminishing of their symptoms within several weeks of conservative care.  We also discussed that the typical conservative treatments available include rest, NSAIDs, muscle relaxants, and physical  therapy as symptoms allow.  Oral and/or injectable steroids are other options to consider.    Some consideration could be given to a soft collar for a short period of time.  However, I would avoid using this for extended periods because of further deconditioning of the paracervical musculature.    -----Steroids: A short oral steroid   taper was prescribed to try to bring the more acute symptoms under control. The patient understands that there are risks associated with steroids including elevated blood sugar, hypertension, increased risk of infections, and thinning of the bones if taken repeatedly or for prolonged periods. The taper can be followed by NSAIDs once completed  -----EMG/NCS:  The patient will be referred for electrodiagnostic testing to include EMG and NCS of the upper extremity to further delineate the cervical versus peripheral involvement and also to confirm the specific cervical myotome(s) involved.    Symptoms persist, we would need to do further imaging of the cervical spine likely with another myelogram CT scan.  Unfortunately the metal spacers do create artifact even with a CT scan.    Orders Placed This Encounter   ??? XR SPINE CERV PA LAT ODONT 3 V MAX   ??? predniSONE (DELTASONE) 5 mg tablet        Follow-up and Dispositions    ?? Return for after EMG/NCS.          Hurshel Party, PA  06/14/19  4:16 PM

## 2019-07-03 ENCOUNTER — Encounter

## 2019-07-22 ENCOUNTER — Ambulatory Visit: Attending: Physical Medicine & Rehabilitation | Primary: Nurse Practitioner

## 2019-07-22 ENCOUNTER — Ambulatory Visit
Admit: 2019-07-22 | Discharge: 2019-07-22 | Payer: PRIVATE HEALTH INSURANCE | Attending: Physical Medicine & Rehabilitation | Primary: Nurse Practitioner

## 2019-07-22 DIAGNOSIS — R200 Anesthesia of skin: Secondary | ICD-10-CM

## 2019-07-22 NOTE — Progress Notes (Signed)
This patient presented for a NCV/EMG today. Study is completed. Please see scanned document attached to encounter.

## 2019-08-02 ENCOUNTER — Encounter

## 2019-08-11 ENCOUNTER — Ambulatory Visit: Attending: Nurse Practitioner | Primary: Nurse Practitioner

## 2019-08-11 ENCOUNTER — Encounter: Attending: Family Medicine | Primary: Nurse Practitioner

## 2019-08-11 ENCOUNTER — Ambulatory Visit
Admit: 2019-08-11 | Discharge: 2019-08-11 | Payer: PRIVATE HEALTH INSURANCE | Attending: Nurse Practitioner | Primary: Nurse Practitioner

## 2019-08-11 DIAGNOSIS — Z01419 Encounter for gynecological examination (general) (routine) without abnormal findings: Secondary | ICD-10-CM

## 2019-08-11 LAB — POCT FECAL IMMUNOCHEMICAL TEST (FIT): Occult Blood, External: NEGATIVE

## 2019-08-11 MED ORDER — METOPROLOL SUCCINATE SR 50 MG 24 HR TAB
50 mg | ORAL_TABLET | Freq: Every day | ORAL | 2 refills | Status: DC
Start: 2019-08-11 — End: 2019-11-23

## 2019-08-11 MED ORDER — LISINOPRIL 20 MG TAB
20 mg | ORAL_TABLET | Freq: Every day | ORAL | 2 refills | Status: DC
Start: 2019-08-11 — End: 2019-11-23

## 2019-08-11 MED ORDER — LEVOTHYROXINE 75 MCG TAB
75 mcg | ORAL_TABLET | Freq: Every day | ORAL | 2 refills | Status: DC
Start: 2019-08-11 — End: 2019-11-23

## 2019-08-11 NOTE — Progress Notes (Signed)
Diagnosis:  Comment: UNSATISFACTORY FOR EVALUATION.  Specimen adequacy  Comment: Specimen processed and examined, but unsatisfactory for evaluation of  epithelial abnormality because of obscuring bacteria.     Need to repeat smear in 2-3 weeks. Is she having any symptoms of BV? If so, we can treat and then repeat. Just let me know.  Thanks

## 2019-08-11 NOTE — Progress Notes (Signed)
Reviewed results and patient verbalizes understanding.

## 2019-08-11 NOTE — Progress Notes (Signed)
Results to patient please.  Glucose is 78, BUN and creatinine are normal.  Liver enzymes are normal.  Hemoglobin is 14.9.  Total cholesterol is 195, triglycerides 252, HDL is 47, and LDL is 105.  The better sugar is controlled the better triglycerides will be as well.  Continue current meds keep scheduled follow-up

## 2019-08-11 NOTE — Progress Notes (Signed)
Progress Notes by Erskine Squibb, NP at 08/11/19 1500                Author: Erskine Squibb, NP  Service: --  Author Type: Nurse Practitioner       Filed: 02/22/20 1647  Encounter Date: 08/11/2019  Status: Addendum          Editor: Erskine Squibb, NP (Nurse Practitioner)          Related Notes: Original Note by Erskine Squibb, NP (Nurse Practitioner) filed at 08/11/19  1831                                  [facility]   [address]   [place], [place] [address]    (ph) [phone] (fax) [phone]   Lehman Whiteley L. Cothran-Pate APRN, FNP-C            64 year old female  comes in as a recheck to chronic issues and to get med refills. She also needs her pap smear.  She never went to Weight Watchers but has been cutting down on what she eats. She has had her mammogram, colonoscopy, and Dexa scan done. She denies smoking or any new issues.            Allergies        Allergen  Reactions         ?  Lipitor [Atorvastatin]  Other (comments)             Muscle pain         ?  Lortab [Hydrocodone-Acetaminophen]  Itching         ?  Penicillins  Hives             Past Medical History:        Diagnosis  Date         ?  Arthritis       ?  Hypertension       ?  Thyroid disease            hypothyroid             Family History         Problem  Relation  Age of Onset          ?  Lung Disease  Mother       ?  Asthma  Mother       ?  Liver Disease  Father       ?  Stroke  Brother            ?  Diabetes  Brother               Social History          Socioeconomic History         ?  Marital status:  DIVORCED              Spouse name:  Not on file         ?  Number of children:  Not on file     ?  Years of education:  Not on file     ?  Highest education level:  Not on file       Occupational History        ?  Not on file       Tobacco Use         ?  Smoking status:  Former Smoker              Packs/day:  1.00         Years:  15.00         Pack years:  15.00          Types:  Cigarettes         Quit date:  10/25/1996         Years since quitting:  22.8         ?  Smokeless tobacco:  Former Network engineer and Sexual Activity         ?  Alcohol use:  No     ?  Drug use:  No     ?  Sexual activity:  Not on file        Other Topics  Concern        ?  Not on file       Social History Narrative        ?  Not on file          Social Determinants of Health          Financial Resource Strain:         ?  Difficulty of Paying Living Expenses:        Food Insecurity:         ?  Worried About Programme researcher, broadcasting/film/video in the Last Year:      ?  Barista in the Last Year:        Transportation Needs:         ?  Freight forwarder (Medical):      ?  Lack of Transportation (Non-Medical):        Physical Activity:         ?  Days of Exercise per Week:      ?  Minutes of Exercise per Session:        Stress:         ?  Feeling of Stress :        Social Connections:         ?  Frequency of Communication with Friends and Family:      ?  Frequency of Social Gatherings with Friends and Family:      ?  Attends Religious Services:      ?  Active Member of Clubs or Organizations:      ?  Attends Banker Meetings:      ?  Marital Status:        Intimate Partner Violence:         ?  Fear of Current or Ex-Partner:      ?  Emotionally Abused:      ?  Physically Abused:         ?  Sexually Abused:              OB History                Gravida      4                Para      4                Term                       Preterm  AB                       Living                                    SAB                       TAB                       Ectopic                       Molar                       Multiple                       Live Births                                             Past Surgical History:         Procedure  Laterality  Date          ?  HX APPENDECTOMY         ?  HX CERVICAL DISKECTOMY    09/16/06          Anterior C5-C6 diskectomy with  decompression;  anterior cervical arthrodesis interbody technique;  anterior cervical instrumentation;  tricortical  iliac crest bone graft harvested through a separate incision.           ?  HX CHOLECYSTECTOMY              ?  HX TAH AND BSO                 Health Maintenance        Topic  Date Due         ?  PAP AKA CERVICAL CYTOLOGY   Never done     ?  Breast Cancer Screen Mammogram   08/07/2020     ?  DTaP/Tdap/Td series (2 - Td or Tdap)  09/26/2022     ?  Colorectal Cancer Screening Combo   11/27/2023     ?  Lipid Screen   05/10/2024     ?  Hepatitis C Screening   Completed     ?  Bone Densitometry (Dexa) Screening   Completed     ?  Shingrix Vaccine Age 93>   Completed     ?  Flu Vaccine   Completed     ?  COVID-19 Vaccine   Completed         ?  Pneumococcal 0-64 years   Aged Out              Current Outpatient Medications:    ?  levothyroxine (SYNTHROID) 75 mcg tablet, Take 1 Tablet by mouth Daily (before breakfast)., Disp: 30 Tablet, Rfl: 2   ?  lisinopriL (PRINIVIL, ZESTRIL) 20 mg tablet, Take 0.5 Tablets by mouth daily., Disp: 30 Tablet, Rfl: 2   ?  metoprolol succinate (TOPROL-XL) 50 mg XL tablet, Take 1 Tablet by mouth daily., Disp: 30 Tablet, Rfl: 2   ?  cyclobenzaprine (Flexeril)  10 mg tablet, Take 1 Tab by mouth nightly., Disp: 30 Tab, Rfl: 2   ?  aspirin 81 mg chewable tablet, Take  by mouth daily., Disp: , Rfl:    ?  multivitamin, tx-iron-ca-min (THERA-M w/ IRON) 9 mg iron-400 mcg tab tablet, Take 1 Tab by mouth daily., Disp: , Rfl:       Review of Systems    Constitutional: Negative.     HENT: Negative.     Eyes: Negative.     Respiratory: Negative.     Cardiovascular: Negative.     Gastrointestinal: Negative.     Genitourinary: Negative.     Musculoskeletal: Negative.     Skin: Negative.     Neurological: Negative.     Endo/Heme/Allergies: Negative.     Psychiatric/Behavioral: Negative.     All other systems reviewed and are negative.         Visit Vitals      BP  (!) 143/81 (BP 1 Location: Left  upper arm, BP Patient Position: Sitting, BP Cuff Size: Adult)        Pulse  (!) 102     Temp  98.4 ??F (36.9 ??C) (Oral)     Resp  17     Ht  5' (1.524 m)     Wt  173 lb 6.4 oz (78.7 kg)     SpO2  98%        BMI  33.86 kg/m??            Physical Exam         Constitutional: She appears well-developed and well-nourished.    HENT:    Head: Normocephalic.   Right Ear: External ear normal.   Left Ear: External ear normal.    Nose: Nose normal.    Mouth/Throat: Oropharynx is clear and moist.    Eyes: Conjunctivae and EOM are normal. Pupils are equal, round, and reactive to light.    Neck: Normal range of motion. Neck supple.    Cardiovascular: Normal rate.     Pulmonary/Chest: Effort normal and breath sounds normal.   Right breast exhibits no inverted nipple, no mass, no nipple discharge, no skin change and no tenderness. Left breast exhibits no inverted nipple, no mass, no nipple discharge, no skin change and no tenderness.    Abdominal: Soft. Bowel sounds are normal.   Genitourinary:  There is no rash, tenderness, lesion or injury on the right labia.  There is no rash, tenderness, lesion or injury on the left labia.  No erythema, tenderness or bleeding in the vagina. No foreign body in the vagina. No signs of injury around the vagina. No vaginal discharge found.    Stool was soft, brown heme negative by fit test   Skin: Skin is warm, dry and intact. No cyanosis. Nails show no clubbing.   Psychiatric: She has a normal mood and affect. Her  speech is normal and behavior is normal. Judgment and thought content normal. Cognition and memory are normal.    Nursing note and vitals reviewed.      PHQ2=2      Assessment & Plan:      1. Gynecologic exam normal   - PAP IG, RFX APTIMA HPV ASCUS ([id number])      2. Essential hypertension   - METABOLIC PANEL, COMPREHENSIVE   - CBC WITH AUTOMATED DIFF   - lisinopriL (PRINIVIL, ZESTRIL) 20 mg tablet; Take 0.5 Tablets by mouth daily.  Dispense: 30 Tablet; Refill: 2   -  metoprolol succinate  (TOPROL-XL) 50 mg XL tablet; Take 1 Tablet by mouth daily.  Dispense: 30 Tablet; Refill: 2      3. Mixed hyperlipidemia   - LIPID PANEL WITH LDL/HDL RATIO      4. Hypothyroidism, unspecified type   - levothyroxine (SYNTHROID) 75 mcg tablet; Take 1 Tablet by mouth Daily (before breakfast).  Dispense: 30 Tablet; Refill: 2      5. Colon cancer screening   - AMB POC FECAL OCCULT BLOOD (INSURE FIT)         Greater than 50% counseling and/or coordination of care: the treatment regimen is extensive; detailed review. Will notify of labs.BP recheck in 2 weeks. This note was dictated using dragon voice recognition software.  It  has been proofread, but there may still exist voice recognition errors that the author did not detect.            Signed By:  Erskine Squibb, NP           August 11, 2019

## 2019-08-12 LAB — METABOLIC PANEL, COMPREHENSIVE
A-G Ratio: 1.8 (ref 1.2–2.2)
ALT (SGPT): 21 IU/L (ref 0–32)
AST (SGOT): 22 IU/L (ref 0–40)
Albumin: 4.5 g/dL (ref 3.8–4.8)
Alk. phosphatase: 87 IU/L (ref 48–121)
BUN/Creatinine ratio: 21 (ref 12–28)
BUN: 14 mg/dL (ref 8–27)
Bilirubin, total: 0.3 mg/dL (ref 0.0–1.2)
CO2: 17 mmol/L — ABNORMAL LOW (ref 20–29)
Calcium: 9.9 mg/dL (ref 8.7–10.3)
Chloride: 108 mmol/L — ABNORMAL HIGH (ref 96–106)
Creatinine: 0.67 mg/dL (ref 0.57–1.00)
GFR est AA: 109 mL/min/{1.73_m2} (ref >59)
GFR est non-AA: 95 mL/min/{1.73_m2} (ref >59)
GLOBULIN, TOTAL: 2.5 g/dL (ref 1.5–4.5)
Glucose: 78 mg/dL (ref 65–99)
Potassium: 4.1 mmol/L (ref 3.5–5.2)
Protein, total: 7 g/dL (ref 6.0–8.5)
Sodium: 144 mmol/L (ref 134–144)

## 2019-08-12 LAB — CBC WITH AUTOMATED DIFF
ABS. BASOPHILS: 0 10*3/uL (ref 0.0–0.2)
ABS. EOSINOPHILS: 0.2 10*3/uL (ref 0.0–0.4)
ABS. IMM. GRANS.: 0 10*3/uL (ref 0.0–0.1)
ABS. MONOCYTES: 0.6 10*3/uL (ref 0.1–0.9)
ABS. NEUTROPHILS: 1.9 10*3/uL (ref 1.4–7.0)
Abs Lymphocytes: 3.1 10*3/uL (ref 0.7–3.1)
BASOPHILS: 1 %
EOSINOPHILS: 3 %
HCT: 43.1 % (ref 34.0–46.6)
HGB: 14.9 g/dL (ref 11.1–15.9)
IMMATURE GRANULOCYTES: 0 %
Lymphocytes: 53 %
MCH: 31.9 pg (ref 26.6–33.0)
MCHC: 34.6 g/dL (ref 31.5–35.7)
MCV: 92 fL (ref 79–97)
MONOCYTES: 10 %
NEUTROPHILS: 33 %
PLATELET: 242 10*3/uL (ref 150–450)
RBC: 4.67 x10E6/uL (ref 3.77–5.28)
RDW: 12.9 % (ref 11.7–15.4)
WBC: 5.8 10*3/uL (ref 3.4–10.8)

## 2019-08-12 LAB — LIPID PANEL WITH LDL/HDL RATIO
Cholesterol, Total: 195 mg/dL (ref 100–199)
Cholesterol, total: 195 mg/dL (ref 100–199)
HDL Cholesterol: 47 mg/dL (ref >39)
HDL: 47 mg/dL (ref >39)
LDL Calculated: 105 mg/dL — ABNORMAL HIGH (ref 0–99)
LDL, calculated: 105 mg/dL — ABNORMAL HIGH (ref 0–99)
LDL/HDL Ratio: 2.2 ratio (ref 0.0–3.2)
LDL/HDL Ratio: 2.2 ratio (ref 0.0–3.2)
Triglyceride: 252 mg/dL — ABNORMAL HIGH (ref 0–149)
Triglycerides: 252 mg/dL — ABNORMAL HIGH (ref 0–149)
VLDL, calculated: 43 mg/dL — ABNORMAL HIGH (ref 5–40)
VLDL: 43 mg/dL — ABNORMAL HIGH (ref 5–40)

## 2019-08-12 LAB — COMPREHENSIVE METABOLIC PANEL
ALT: 21 IU/L (ref 0–32)
AST: 22 IU/L (ref 0–40)
Albumin/Globulin Ratio: 1.8 NA (ref 1.2–2.2)
Albumin: 4.5 g/dL (ref 3.8–4.8)
Alkaline Phosphatase: 87 IU/L (ref 48–121)
BUN: 14 mg/dL (ref 8–27)
Bun/Cre Ratio: 21 NA (ref 12–28)
CO2: 17 mmol/L — ABNORMAL LOW (ref 20–29)
Calcium: 9.9 mg/dL (ref 8.7–10.3)
Chloride: 108 mmol/L — ABNORMAL HIGH (ref 96–106)
Creatinine: 0.67 mg/dL (ref 0.57–1.00)
EGFR IF NonAfrican American: 95 mL/min/{1.73_m2} (ref >59)
GFR African American: 109 mL/min/{1.73_m2} (ref >59)
Globulin, Total: 2.5 g/dL (ref 1.5–4.5)
Glucose: 78 mg/dL (ref 65–99)
Potassium: 4.1 mmol/L (ref 3.5–5.2)
Sodium: 144 mmol/L (ref 134–144)
Total Bilirubin: 0.3 mg/dL (ref 0.0–1.2)
Total Protein: 7 g/dL (ref 6.0–8.5)

## 2019-08-12 LAB — CBC WITH AUTO DIFFERENTIAL
Basophils %: 1 %
Basophils Absolute: 0 10*3/uL (ref 0.0–0.2)
Eosinophils %: 3 %
Eosinophils Absolute: 0.2 10*3/uL (ref 0.0–0.4)
Granulocyte Absolute Count: 0 10*3/uL (ref 0.0–0.1)
Hematocrit: 43.1 % (ref 34.0–46.6)
Hemoglobin: 14.9 g/dL (ref 11.1–15.9)
Immature Granulocytes: 0 %
Lymphocytes %: 53 %
Lymphocytes Absolute: 3.1 10*3/uL (ref 0.7–3.1)
MCH: 31.9 pg (ref 26.6–33.0)
MCHC: 34.6 g/dL (ref 31.5–35.7)
MCV: 92 fL (ref 79–97)
Monocytes %: 10 %
Monocytes Absolute: 0.6 10*3/uL (ref 0.1–0.9)
Neutrophils %: 33 %
Neutrophils Absolute: 1.9 10*3/uL (ref 1.4–7.0)
Platelets: 242 10*3/uL (ref 150–450)
RBC: 4.67 x10E6/uL (ref 3.77–5.28)
RDW: 12.9 % (ref 11.7–15.4)
WBC: 5.8 10*3/uL (ref 3.4–10.8)

## 2019-08-13 ENCOUNTER — Inpatient Hospital Stay: Admit: 2019-08-13 | Payer: PRIVATE HEALTH INSURANCE | Attending: Nurse Practitioner | Primary: Nurse Practitioner

## 2019-08-13 DIAGNOSIS — Z1231 Encounter for screening mammogram for malignant neoplasm of breast: Secondary | ICD-10-CM

## 2019-08-13 LAB — PAP IG, RFX APTIMA HPV ASCUS (507800)
.: 0
LABCORP 019018: 0

## 2019-08-13 NOTE — Progress Notes (Signed)
Reviewed results and patient verbalizes understanding.

## 2019-08-13 NOTE — Progress Notes (Signed)
IMPRESSION  1. No mammographic evidence of malignancy.  Recommend annual mammogram in one  year.  A reminder letter will be scheduled.  ??  ??  BI-RADS Assessment Category 2: Benign finding.  ??

## 2019-08-14 ENCOUNTER — Ambulatory Visit: Payer: PRIVATE HEALTH INSURANCE | Primary: Nurse Practitioner

## 2019-09-08 ENCOUNTER — Encounter: Attending: Nurse Practitioner | Primary: Nurse Practitioner

## 2019-09-30 ENCOUNTER — Encounter: Payer: PRIVATE HEALTH INSURANCE | Attending: Nurse Practitioner | Primary: Nurse Practitioner

## 2019-10-31 ENCOUNTER — Encounter

## 2019-11-11 ENCOUNTER — Encounter: Attending: Nurse Practitioner | Primary: Nurse Practitioner

## 2019-11-23 ENCOUNTER — Ambulatory Visit: Attending: Nurse Practitioner | Primary: Nurse Practitioner

## 2019-11-23 ENCOUNTER — Ambulatory Visit
Admit: 2019-11-23 | Discharge: 2019-11-23 | Payer: PRIVATE HEALTH INSURANCE | Attending: Nurse Practitioner | Primary: Nurse Practitioner

## 2019-11-23 DIAGNOSIS — I10 Essential (primary) hypertension: Secondary | ICD-10-CM

## 2019-11-23 MED ORDER — METOPROLOL SUCCINATE SR 50 MG 24 HR TAB
50 mg | ORAL_TABLET | Freq: Every day | ORAL | 2 refills | Status: DC
Start: 2019-11-23 — End: 2020-02-22

## 2019-11-23 MED ORDER — LISINOPRIL 20 MG TAB
20 mg | ORAL_TABLET | Freq: Every day | ORAL | 2 refills | Status: DC
Start: 2019-11-23 — End: 2020-02-22

## 2019-11-23 MED ORDER — LEVOTHYROXINE 75 MCG TAB
75 mcg | ORAL_TABLET | Freq: Every day | ORAL | 2 refills | Status: DC
Start: 2019-11-23 — End: 2020-02-22

## 2019-11-23 NOTE — Progress Notes (Signed)
Reviewed results and patient verbalizes understanding. Pt states she has not been following any specific diet but would start.

## 2019-11-23 NOTE — Progress Notes (Signed)
Progress Notes by Erskine Squibb, NP at 11/23/19 1500                Author: Erskine Squibb, NP  Service: --  Author Type: Nurse Practitioner       Filed: 11/23/19 1744  Encounter Date: 11/23/2019  Status: Signed          Editor: Erskine Squibb, NP (Nurse Practitioner)                                  [name]   [address]   [place], [place] [address]    (ph) [phone] (fax) [phone]   Sandra Kudo L. Cothran-Pate APRN, FNP-C            64 year old female  comes in as a recheck to chronic issues and to get med refills. She also needs to repeat her  pap smear. She will do this at next appt. She never went to Weight Watchers but has been cutting down on what she eats. She has had her mammogram, colonoscopy, and Dexa scan done. She denies smoking. She is c/o back pain and right hip pain. She denies  injury and reports it may be arthritis.            Allergies        Allergen  Reactions         ?  Lipitor [Atorvastatin]  Other (comments)             Muscle pain         ?  Lortab [Hydrocodone-Acetaminophen]  Itching         ?  Penicillins  Hives             Past Medical History:        Diagnosis  Date         ?  Arthritis       ?  Hypertension       ?  Thyroid disease            hypothyroid             Family History         Problem  Relation  Age of Onset          ?  Lung Disease  Mother       ?  Asthma  Mother       ?  Liver Disease  Father       ?  Stroke  Brother            ?  Diabetes  Brother               Social History          Socioeconomic History         ?  Marital status:  DIVORCED              Spouse name:  Not on file         ?  Number of children:  Not on file         ?  Years of education:  Not on file     ?  Highest education level:  Not on file       Occupational History        ?  Not on file       Tobacco  Use         ?  Smoking status:  Former Smoker              Packs/day:  1.00         Years:  15.00         Pack years:  15.00          Types:  Cigarettes         Quit date:  10/25/1996         Years since quitting:  23.0         ?  Smokeless tobacco:  Former Network engineer and Sexual Activity         ?  Alcohol use:  No     ?  Drug use:  No     ?  Sexual activity:  Not on file        Other Topics  Concern        ?  Not on file       Social History Narrative        ?  Not on file          Social Determinants of Health          Financial Resource Strain:         ?  Difficulty of Paying Living Expenses:        Food Insecurity:         ?  Worried About Programme researcher, broadcasting/film/video in the Last Year:      ?  Barista in the Last Year:        Transportation Needs:         ?  Freight forwarder (Medical):      ?  Lack of Transportation (Non-Medical):        Physical Activity:         ?  Days of Exercise per Week:      ?  Minutes of Exercise per Session:        Stress:         ?  Feeling of Stress :        Social Connections:         ?  Frequency of Communication with Friends and Family:      ?  Frequency of Social Gatherings with Friends and Family:      ?  Attends Religious Services:      ?  Active Member of Clubs or Organizations:      ?  Attends Banker Meetings:      ?  Marital Status:        Intimate Partner Violence:         ?  Fear of Current or Ex-Partner:      ?  Emotionally Abused:      ?  Physically Abused:         ?  Sexually Abused:              OB History                Gravida      4                Para      4                Term  Preterm                       AB                       Living                                    SAB                       TAB                       Ectopic                       Molar                       Multiple                       Live Births                                             Past Surgical History:         Procedure  Laterality  Date          ?  HX APPENDECTOMY         ?  HX CERVICAL DISKECTOMY    09/16/06          Anterior C5-C6 diskectomy with  decompression;  anterior cervical arthrodesis interbody technique;  anterior cervical instrumentation;  tricortical  iliac crest bone graft harvested through a separate incision.           ?  HX CHOLECYSTECTOMY              ?  HX TAH AND BSO                 Health Maintenance        Topic  Date Due         ?  Breast Cancer Screen Mammogram   08/12/2021     ?  DTaP/Tdap/Td series (2 - Td or Tdap)  09/26/2022     ?  Colorectal Cancer Screening Combo   11/27/2023     ?  Lipid Screen   08/10/2024     ?  Hepatitis C Screening   Completed     ?  Bone Densitometry (Dexa) Screening   Completed     ?  Shingrix Vaccine Age 5>   Completed     ?  Flu Vaccine   Completed     ?  COVID-19 Vaccine   Completed         ?  Pneumococcal 0-64 years   Aged Out              Current Outpatient Medications:    ?  metoprolol succinate (TOPROL-XL) 50 mg XL tablet, Take 1 Tablet by mouth daily., Disp: 30 Tablet, Rfl: 2   ?  lisinopriL (PRINIVIL, ZESTRIL) 20 mg tablet, Take 0.5 Tablets by mouth daily., Disp: 30 Tablet, Rfl: 2   ?  levothyroxine (SYNTHROID) 75 mcg tablet, Take 1 Tablet by mouth Daily (before breakfast)., Disp: 30  Tablet, Rfl: 2   ?  cyclobenzaprine (Flexeril) 10 mg tablet, Take 1 Tab by mouth nightly., Disp: 30 Tab, Rfl: 2   ?  aspirin 81 mg chewable tablet, Take  by mouth daily., Disp: , Rfl:    ?  multivitamin, tx-iron-ca-min (THERA-M w/ IRON) 9 mg iron-400 mcg tab tablet, Take 1 Tab by mouth daily., Disp: , Rfl:       Review of Systems    Constitutional: Negative.     HENT: Negative.     Eyes: Negative.     Respiratory: Negative.     Cardiovascular: Negative.     Gastrointestinal: Negative.     Genitourinary: Negative.     Musculoskeletal: Positive for back pain and joint pain  (right hip).    Skin: Negative.     Neurological: Negative.     Endo/Heme/Allergies: Negative.     Psychiatric/Behavioral: Negative.     All other systems reviewed and are negative.         Visit Vitals      BP  118/80     Pulse  99     Temp  98.2 ??F  (36.8 ??C) (Oral)     Ht  5' (1.524 m)     Wt  173 lb (78.5 kg)     SpO2  98%        BMI  33.79 kg/m??            Physical Exam   Constitutional :        Appearance: She is well-developed.   HENT :       Head: Normocephalic.   Eyes :       Pupils: Pupils are equal, round, and reactive to light.   Cardiovascular:       Rate and Rhythm: Normal rate and regular rhythm.      Heart sounds: No murmur heard.   No friction rub. No gallop.     Pulmonary:       Effort: Pulmonary effort is normal.      Breath sounds: Normal breath sounds.   Abdominal :      General: Bowel sounds are normal.      Palpations: Abdomen is soft.     Musculoskeletal:          General: Tenderness (lumbar and right hip. Pain radiates down right leg at  times) present. Normal range of motion.      Cervical back: Normal range of motion.    Skin:      General: Skin is warm and dry.   Neurological :       Mental Status: She is alert and oriented to person, place, and time.         PHQ2=0         Assessment & Plan:      1. Essential hypertension   - metoprolol succinate (TOPROL-XL) 50 mg XL tablet; Take 1 Tablet by mouth daily.  Dispense: 30 Tablet; Refill: 2   - lisinopriL (PRINIVIL, ZESTRIL) 20 mg tablet; Take 0.5 Tablets by mouth daily.  Dispense: 30 Tablet; Refill: 2   - METABOLIC PANEL, COMPREHENSIVE   - CBC WITH AUTOMATED DIFF      2. Hypothyroidism, unspecified type   - levothyroxine (SYNTHROID) 75 mcg tablet; Take 1 Tablet by mouth Daily (before breakfast).  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 Right hip pain   - XR HIP RT W OR WO PELV 2-3 VWS; Future      5. Mixed hyperlipidemia      - LIPID PANEL WITH LDL/HDL RATIO      6. Encounter for immunization   - INFLUENZA VIRUS VAC QUAD,SPLIT,PRESV FREE SYRINGE IM         Greater than 50% counseling and/or coordination of care: the treatment regimen is extensive; detailed review. Will  notify of labs/xrays. Follow-up TBD after labs and xray. Repeat pap at her convenience.  This note was dictated using dragon voice recognition  software.  It has been proofread, but there may still exist voice recognition errors that the author did not detect.            Signed By:  Erskine SquibbSonya L Cothran-Pate, NP           November 23, 2019

## 2019-11-23 NOTE — Progress Notes (Signed)
Results to patient please.  Glucose 71.  BUN and creatinine are normal.  Liver enzymes are normal.  Hemoglobin is 15.3.  Total cholesterol 211, triglycerides 176, HDL is 56, LDL is 124.  Is she on a low-cholesterol diet?  If not, please encourage this.  If so, please let me know.  We will recheck at follow-up.  Thyroid is normal.  Continue current meds keep scheduled follow-up

## 2019-11-24 LAB — METABOLIC PANEL, COMPREHENSIVE
A-G Ratio: 2 (ref 1.2–2.2)
ALT (SGPT): 23 IU/L (ref 0–32)
AST (SGOT): 22 IU/L (ref 0–40)
Albumin: 4.9 g/dL — ABNORMAL HIGH (ref 3.8–4.8)
Alk. phosphatase: 92 IU/L (ref 44–121)
BUN/Creatinine ratio: 17 (ref 12–28)
BUN: 11 mg/dL (ref 8–27)
Bilirubin, total: 0.5 mg/dL (ref 0.0–1.2)
CO2: 22 mmol/L (ref 20–29)
Calcium: 9.7 mg/dL (ref 8.7–10.3)
Chloride: 106 mmol/L (ref 96–106)
Creatinine: 0.64 mg/dL (ref 0.57–1.00)
GFR est AA: 111 mL/min/{1.73_m2} (ref >59)
GFR est non-AA: 96 mL/min/{1.73_m2} (ref >59)
GLOBULIN, TOTAL: 2.4 g/dL (ref 1.5–4.5)
Glucose: 71 mg/dL (ref 65–99)
Potassium: 4.2 mmol/L (ref 3.5–5.2)
Protein, total: 7.3 g/dL (ref 6.0–8.5)
Sodium: 142 mmol/L (ref 134–144)

## 2019-11-24 LAB — CBC WITH AUTOMATED DIFF
ABS. BASOPHILS: 0 10*3/uL (ref 0.0–0.2)
ABS. EOSINOPHILS: 0.2 10*3/uL (ref 0.0–0.4)
ABS. IMM. GRANS.: 0 10*3/uL (ref 0.0–0.1)
ABS. MONOCYTES: 0.6 10*3/uL (ref 0.1–0.9)
ABS. NEUTROPHILS: 2.4 10*3/uL (ref 1.4–7.0)
Abs Lymphocytes: 3.3 10*3/uL — ABNORMAL HIGH (ref 0.7–3.1)
BASOPHILS: 1 %
EOSINOPHILS: 3 %
HCT: 43.9 % (ref 34.0–46.6)
HGB: 15.3 g/dL (ref 11.1–15.9)
IMMATURE GRANULOCYTES: 0 %
Lymphocytes: 50 %
MCH: 31 pg (ref 26.6–33.0)
MCHC: 34.9 g/dL (ref 31.5–35.7)
MCV: 89 fL (ref 79–97)
MONOCYTES: 9 %
NEUTROPHILS: 37 %
PLATELET: 249 10*3/uL (ref 150–450)
RBC: 4.94 x10E6/uL (ref 3.77–5.28)
RDW: 12.5 % (ref 11.7–15.4)
WBC: 6.5 10*3/uL (ref 3.4–10.8)

## 2019-11-24 LAB — LIPID PANEL WITH LDL/HDL RATIO
Cholesterol, Total: 211 mg/dL — ABNORMAL HIGH (ref 100–199)
Cholesterol, total: 211 mg/dL — ABNORMAL HIGH (ref 100–199)
HDL Cholesterol: 56 mg/dL (ref >39)
HDL: 56 mg/dL (ref >39)
LDL Calculated: 124 mg/dL — ABNORMAL HIGH (ref 0–99)
LDL, calculated: 124 mg/dL — ABNORMAL HIGH (ref 0–99)
LDL/HDL Ratio: 2.2 ratio (ref 0.0–3.2)
LDL/HDL Ratio: 2.2 ratio (ref 0.0–3.2)
Triglyceride: 176 mg/dL — ABNORMAL HIGH (ref 0–149)
Triglycerides: 176 mg/dL — ABNORMAL HIGH (ref 0–149)
VLDL, calculated: 31 mg/dL (ref 5–40)
VLDL: 31 mg/dL (ref 5–40)

## 2019-11-24 LAB — TSH AND FREE T4
T4, Free: 0.99 ng/dL (ref 0.82–1.77)
TSH: 3.9 u[IU]/mL (ref 0.450–4.500)

## 2019-11-24 LAB — CBC WITH AUTO DIFFERENTIAL
Basophils %: 1 %
Basophils Absolute: 0 10*3/uL (ref 0.0–0.2)
Eosinophils %: 3 %
Eosinophils Absolute: 0.2 10*3/uL (ref 0.0–0.4)
Granulocyte Absolute Count: 0 10*3/uL (ref 0.0–0.1)
Hematocrit: 43.9 % (ref 34.0–46.6)
Hemoglobin: 15.3 g/dL (ref 11.1–15.9)
Immature Granulocytes: 0 %
Lymphocytes %: 50 %
Lymphocytes Absolute: 3.3 10*3/uL — ABNORMAL HIGH (ref 0.7–3.1)
MCH: 31 pg (ref 26.6–33.0)
MCHC: 34.9 g/dL (ref 31.5–35.7)
MCV: 89 fL (ref 79–97)
Monocytes %: 9 %
Monocytes Absolute: 0.6 10*3/uL (ref 0.1–0.9)
Neutrophils %: 37 %
Neutrophils Absolute: 2.4 10*3/uL (ref 1.4–7.0)
Platelets: 249 10*3/uL (ref 150–450)
RBC: 4.94 x10E6/uL (ref 3.77–5.28)
RDW: 12.5 % (ref 11.7–15.4)
WBC: 6.5 10*3/uL (ref 3.4–10.8)

## 2019-11-24 LAB — COMPREHENSIVE METABOLIC PANEL
ALT: 23 IU/L (ref 0–32)
AST: 22 IU/L (ref 0–40)
Albumin/Globulin Ratio: 2 NA (ref 1.2–2.2)
Albumin: 4.9 g/dL — ABNORMAL HIGH (ref 3.8–4.8)
Alkaline Phosphatase: 92 IU/L (ref 44–121)
BUN: 11 mg/dL (ref 8–27)
Bun/Cre Ratio: 17 NA (ref 12–28)
CO2: 22 mmol/L (ref 20–29)
Calcium: 9.7 mg/dL (ref 8.7–10.3)
Chloride: 106 mmol/L (ref 96–106)
Creatinine: 0.64 mg/dL (ref 0.57–1.00)
EGFR IF NonAfrican American: 96 mL/min/{1.73_m2} (ref >59)
GFR African American: 111 mL/min/{1.73_m2} (ref >59)
Globulin, Total: 2.4 g/dL (ref 1.5–4.5)
Glucose: 71 mg/dL (ref 65–99)
Potassium: 4.2 mmol/L (ref 3.5–5.2)
Sodium: 142 mmol/L (ref 134–144)
Total Bilirubin: 0.5 mg/dL (ref 0.0–1.2)
Total Protein: 7.3 g/dL (ref 6.0–8.5)

## 2019-11-24 LAB — TSH + FREE T4 PANEL
T4 Free: 0.99 ng/dL (ref 0.82–1.77)
TSH: 3.9 u[IU]/mL (ref 0.450–4.500)

## 2019-11-25 ENCOUNTER — Encounter: Payer: PRIVATE HEALTH INSURANCE | Attending: Nurse Practitioner | Primary: Nurse Practitioner

## 2019-11-30 ENCOUNTER — Encounter

## 2019-12-08 ENCOUNTER — Inpatient Hospital Stay
Admit: 2019-12-08 | Discharge: 2019-12-09 | Disposition: A | Discharge status: Home / Self Care | Payer: PRIVATE HEALTH INSURANCE | Attending: Emergency Medicine

## 2019-12-08 ENCOUNTER — Emergency Department: Admit: 2019-12-08 | Payer: PRIVATE HEALTH INSURANCE | Primary: Nurse Practitioner

## 2019-12-08 DIAGNOSIS — M79605 Pain in left leg: Secondary | ICD-10-CM

## 2019-12-08 NOTE — ED Provider Notes (Signed)
Per nurses notes: "Pt ambulatory unassisted to triage with mask in place. Pt complains of L leg pain onset Sunday. Denies injury. Denies hx of DVT. Denies CP. Denies SOB."    Patient states she attempted to follow-up with her family doctor for the discomfort, and she was sent to the emergency department to rule out possible DVT.    The history is provided by the patient.   Leg Pain   This is a new problem. The current episode started more than 2 days ago. The problem occurs daily. The problem has been gradually worsening. The pain is present in the right lower leg. The quality of the pain is described as aching. The pain is at a severity of 5/10. Associated symptoms include stiffness. Pertinent negatives include no numbness, full range of motion, no tingling, no itching, no back pain and no neck pain. The symptoms are aggravated by movement, standing and palpation. She has tried rest for the symptoms. The treatment provided no relief. There has been no history of extremity trauma.        Past Medical History:   Diagnosis Date   ??? Arthritis    ??? Hypertension    ??? Thyroid disease     hypothyroid       Past Surgical History:   Procedure Laterality Date   ??? HX APPENDECTOMY     ??? HX CERVICAL DISKECTOMY  09/16/06    Anterior C5-C6 diskectomy with decompression;  anterior cervical arthrodesis interbody technique;  anterior cervical instrumentation;  tricortical iliac crest bone graft harvested through a separate incision.    ??? HX CHOLECYSTECTOMY     ??? HX TAH AND BSO           Family History:   Problem Relation Age of Onset   ??? Lung Disease Mother    ??? Asthma Mother    ??? Liver Disease Father    ??? Stroke Brother    ??? Diabetes Brother        Social History     Socioeconomic History   ??? Marital status: DIVORCED     Spouse name: Not on file   ??? Number of children: Not on file   ??? Years of education: Not on file   ??? Highest education level: Not on file   Occupational History   ??? Not on file   Tobacco Use   ??? Smoking status:  Former Smoker     Packs/day: 1.00     Years: 15.00     Pack years: 15.00     Types: Cigarettes     Quit date: 10/25/1996     Years since quitting: 23.1   ??? Smokeless tobacco: Former Estate agent and Sexual Activity   ??? Alcohol use: No   ??? Drug use: No   ??? Sexual activity: Not on file   Other Topics Concern   ??? Not on file   Social History Narrative   ??? Not on file     Social Determinants of Health     Financial Resource Strain:    ??? Difficulty of Paying Living Expenses:    Food Insecurity:    ??? Worried About Programme researcher, broadcasting/film/video in the Last Year:    ??? Barista in the Last Year:    Transportation Needs:    ??? Freight forwarder (Medical):    ??? Lack of Transportation (Non-Medical):    Physical Activity:    ??? Days of Exercise per Week:    ???  Minutes of Exercise per Session:    Stress:    ??? Feeling of Stress :    Social Connections:    ??? Frequency of Communication with Friends and Family:    ??? Frequency of Social Gatherings with Friends and Family:    ??? Attends Religious Services:    ??? Database administrator or Organizations:    ??? Attends Engineer, structural:    ??? Marital Status:    Intimate Programme researcher, broadcasting/film/video Violence:    ??? Fear of Current or Ex-Partner:    ??? Emotionally Abused:    ??? Physically Abused:    ??? Sexually Abused:          ALLERGIES: Lipitor [atorvastatin], Lortab [hydrocodone-acetaminophen], and Penicillins    Review of Systems   Constitutional: Negative for chills and fever.   Respiratory: Negative for cough and shortness of breath.    Cardiovascular: Negative for chest pain and palpitations.   Musculoskeletal: Positive for myalgias and stiffness. Negative for back pain and neck pain.   Skin: Negative for itching.   Neurological: Negative for tingling and numbness.   All other systems reviewed and are negative.      Vitals:    12/08/19 1718   BP: (!) 155/110   Pulse: (!) 110   Resp: 18   Temp: 98.2 ??F (36.8 ??C)   SpO2: 99%   Weight: 77.1 kg (170 lb)   Height: 5' (1.524 m)            Physical  Exam  Vitals and nursing note reviewed.   Constitutional:       General: She is not in acute distress.     Appearance: She is well-developed.   HENT:      Head: Normocephalic and atraumatic.      Right Ear: External ear normal.      Left Ear: External ear normal.   Eyes:      Extraocular Movements: Extraocular movements intact.      Conjunctiva/sclera: Conjunctivae normal.      Pupils: Pupils are equal, round, and reactive to light.   Cardiovascular:      Rate and Rhythm: Normal rate and regular rhythm.      Heart sounds: Normal heart sounds.   Pulmonary:      Effort: Pulmonary effort is normal.      Breath sounds: Normal breath sounds.   Abdominal:      General: Bowel sounds are normal.      Palpations: Abdomen is soft.      Tenderness: There is no abdominal tenderness.   Musculoskeletal:         General: Tenderness (Left calf with minimal swelling.  Negative Homans.) present. No deformity or signs of injury. Normal range of motion.      Cervical back: Normal range of motion and neck supple.      Right lower leg: No edema.      Left lower leg: No edema.   Skin:     General: Skin is warm and dry.      Capillary Refill: Capillary refill takes less than 2 seconds.      Findings: No bruising.   Neurological:      General: No focal deficit present.      Mental Status: She is alert and oriented to person, place, and time.   Psychiatric:         Mood and Affect: Mood normal.         Behavior: Behavior normal.  MDM  Number of Diagnoses or Management Options  Left leg pain: new and requires workup     Amount and/or Complexity of Data Reviewed  Tests in the radiology section of CPT??: reviewed and ordered  Review and summarize past medical records: yes    Risk of Complications, Morbidity, and/or Mortality  Presenting problems: moderate  Diagnostic procedures: moderate  Management options: low    Patient Progress  Patient progress: stable         Procedures    The patient was observed in the ED. ultrasound negative for  evidence of DVT.  I suspect her discomfort is more related to calf strain and spasm.  Patient currently takes Flexeril every night.  Recommended she take Tylenol for pain, frequent stretching and consider follow-up with physical therapy or a massage therapist.    Results Reviewed:    DUPLEX LOWER EXT VENOUS LEFT   Final Result   Negative for sonographic evidence of deep venous thrombosis left   lower extremity.              I discussed the results of all labs, procedures, radiographs, and treatments with the patient and available family.  Treatment plan is agreed upon and the patient is ready for discharge.  All voiced understanding of the discharge plan and medication instructions or changes as appropriate.  Questions about treatment in the ED were answered.  All were encouraged to return should symptoms worsen or new problems develop.

## 2019-12-08 NOTE — ED Notes (Signed)
Pt ambulatory unassisted to triage with mask in place. Pt complains of L leg pain onset Sunday. Denies injury. Denies hx of DVT. Denies CP. Denies SOB.

## 2019-12-10 ENCOUNTER — Encounter: Attending: Family Medicine | Primary: Nurse Practitioner

## 2019-12-30 ENCOUNTER — Encounter

## 2020-01-03 ENCOUNTER — Telehealth: Attending: Family | Primary: Nurse Practitioner

## 2020-01-03 ENCOUNTER — Telehealth
Admit: 2020-01-03 | Discharge: 2020-01-03 | Payer: PRIVATE HEALTH INSURANCE | Attending: Family | Primary: Nurse Practitioner

## 2020-01-03 DIAGNOSIS — J019 Acute sinusitis, unspecified: Secondary | ICD-10-CM

## 2020-01-03 DIAGNOSIS — B9689 Other specified bacterial agents as the cause of diseases classified elsewhere: Secondary | ICD-10-CM

## 2020-01-03 MED ORDER — AZITHROMYCIN 250 MG TAB
250 mg | ORAL_TABLET | ORAL | 0 refills | Status: AC
Start: 2020-01-03 — End: 2020-01-08

## 2020-01-03 NOTE — Progress Notes (Signed)
Sandra Harmon is a 64 y.o. female, new to me today for sick visit evaluated via audio-only technology on 01/03/2020 for Sinus Infection  Onset 6 days starting last Wednesday initially had sneezing and allergy type symptoms.  Pfzier booster October 22nd and had flu vaccine this season.  Can taste and smell.  Blows nose thick yellow mucous.  Wears masks at work, works with infants.  Went to ED several weeks ago due to leg pain, ultrasound was negative.  Started out with allergy medication initially.  Symptoms came on Saturday with sneezing and watery eyes along with congestion, started DayQuil and NyQuil with no help.  Feels fatigued.  Cannot breath out of nose.  Vicks bombs no help.  PND coughing, nonproductive.  Blows nose thick yellow mucous.  No history of allergy symptoms or seasonal allergies.  Normally never sick.  Baby at work with croup.  Sinus pressure.  Can't breath from nose.  Feeling worse symptoms.  Denies any kidney or liver problems, fever, and nausea or vomiting.  Assessment & Plan:   Diagnoses and all orders for this visit:    1. Acute bacterial sinusitis  -     azithromycin (ZITHROMAX) 250 mg tablet; Take 1 Tablet by mouth See Admin Instructions for 5 days. Indications: bacterial sinusitis    Discussed with patient using hot showers, hot compresses, and nasal saline sprays.    The complexity of medical decision making for this visit is moderate         12  Subjective:       Prior to Admission medications    Medication Sig Start Date End Date Taking? Authorizing Provider   metoprolol succinate (TOPROL-XL) 50 mg XL tablet Take 1 Tablet by mouth daily. 11/23/19   Cothran-Pate, Sonya L, NP   lisinopriL (PRINIVIL, ZESTRIL) 20 mg tablet Take 0.5 Tablets by mouth daily. 11/23/19   Cothran-Pate, Sonya L, NP   levothyroxine (SYNTHROID) 75 mcg tablet Take 1 Tablet by mouth Daily (before breakfast). 11/23/19   Cothran-Pate, Sonya L, NP   cyclobenzaprine (Flexeril) 10 mg tablet Take 1 Tab by mouth nightly.  05/11/19   Cothran-Pate, Sonya L, NP   aspirin 81 mg chewable tablet Take  by mouth daily.    Provider, Historical   multivitamin, tx-iron-ca-min (THERA-M w/ IRON) 9 mg iron-400 mcg tab tablet Take 1 Tab by mouth daily.    Provider, Historical     Patient Active Problem List   Diagnosis Code   ??? 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 Chest pain R07.9   ??? Hypomagnesemia E83.42   ??? Altered mental status, unspecified R41.82   ??? Hx-TIA (transient ischemic attack) Z86.73   ??? Syncope, vasovagal R55   ??? Encounter for medication management Z79.899   ??? Lightheadedness R42     Patient Active Problem List    Diagnosis Date Noted   ??? Lightheadedness 10/18/2015   ??? Altered mental status, unspecified 08/28/2015   ??? Hx-TIA (transient ischemic attack) 08/28/2015   ??? Syncope, vasovagal 08/28/2015   ??? Encounter for medication management 08/28/2015   ??? Hypomagnesemia 06/01/2011   ??? Adhesive capsulitis of shoulder 05/31/2011   ??? Primary localized osteoarthrosis, shoulder region 05/31/2011   ??? Chest pain 05/31/2011     Current Outpatient Medications   Medication Sig Dispense Refill   ??? azithromycin (ZITHROMAX) 250 mg tablet Take 1 Tablet by mouth See Admin Instructions for 5 days. Indications: bacterial sinusitis 6 Tablet 0   ???  metoprolol succinate (TOPROL-XL) 50 mg XL tablet Take 1 Tablet by mouth daily. 30 Tablet 2   ??? lisinopriL (PRINIVIL, ZESTRIL) 20 mg tablet Take 0.5 Tablets by mouth daily. 30 Tablet 2   ??? levothyroxine (SYNTHROID) 75 mcg tablet Take 1 Tablet by mouth Daily (before breakfast). 30 Tablet 2   ??? cyclobenzaprine (Flexeril) 10 mg tablet Take 1 Tab by mouth nightly. 30 Tab 2   ??? aspirin 81 mg chewable tablet Take  by mouth daily.     ??? multivitamin, tx-iron-ca-min (THERA-M w/ IRON) 9 mg iron-400 mcg tab tablet Take 1 Tab by mouth daily.       Allergies   Allergen Reactions   ??? Lipitor [Atorvastatin] Other (comments)     Muscle pain   ??? Lortab  [Hydrocodone-Acetaminophen] Itching   ??? Penicillins Hives     Past Medical History:   Diagnosis Date   ??? Arthritis    ??? Hypertension    ??? Thyroid disease     hypothyroid     Past Surgical History:   Procedure Laterality Date   ??? HX APPENDECTOMY     ??? HX CERVICAL DISKECTOMY  09/16/06    Anterior C5-C6 diskectomy with decompression;  anterior cervical arthrodesis interbody technique;  anterior cervical instrumentation;  tricortical iliac crest bone graft harvested through a separate incision.    ??? HX CHOLECYSTECTOMY     ??? HX TAH AND BSO       Family History   Problem Relation Age of Onset   ??? Lung Disease Mother    ??? Asthma Mother    ??? Liver Disease Father    ??? Stroke Brother    ??? Diabetes Brother      Social History     Tobacco Use   ??? Smoking status: Former Smoker     Packs/day: 1.00     Years: 15.00     Pack years: 15.00     Types: Cigarettes     Quit date: 10/25/1996     Years since quitting: 23.2   ??? Smokeless tobacco: Former Estate agent Use Topics   ??? Alcohol use: No       Review of Systems   Constitutional: Positive for chills and malaise/fatigue. Negative for fever.        The other night, felt cold.  Never runs fever even when she had the flu.   HENT: Positive for congestion and ear pain. Negative for ear discharge, hearing loss, nosebleeds, sinus pain, sore throat and tinnitus.         Congestion in the head.  Chronic ringing in her ears.  Lips stay dry all the time.   Eyes: Negative for pain, discharge and redness.   Respiratory: Positive for cough and shortness of breath. Negative for wheezing.         Mild winded when coughs.     Cardiovascular: Positive for palpitations. Negative for chest pain and leg swelling.        Palpitations when coughs.   Gastrointestinal: Negative for diarrhea, nausea and vomiting.   Genitourinary: Negative.    Musculoskeletal: Negative for joint pain and myalgias.   Skin: Negative.    Neurological: Negative for dizziness, weakness and headaches.   Psychiatric/Behavioral:  Negative for depression. The patient is not nervous/anxious.      Physical Exam  Pulmonary:      Effort: Pulmonary effort is normal.   Neurological:      Mental Status: She is alert and oriented to person,  place, and time.   Psychiatric:         Mood and Affect: Mood normal.         Behavior: Behavior normal.         Thought Content: Thought content normal.         Judgment: Judgment normal.         Diagnoses and all orders for this visit:    1. Acute bacterial sinusitis  -     azithromycin (ZITHROMAX) 250 mg tablet; Take 1 Tablet by mouth See Admin Instructions for 5 days. Indications: bacterial sinusitis      Follow-up if no resolution.  If develops chest pain, shortness of breath, or fever, needs to report to the emergency room.  No flowsheet data found.    Ronni Rumble, who was evaluated through a patient-initiated, synchronous (real-time) audio only encounter, and/or her healthcare decision maker, is aware that it is a billable service, with coverage as determined by her insurance carrier. She provided verbal consent to proceed: Yes. She has not had a related appointment within my department in the past 7 days or scheduled within the next 24 hours.    On this date 01/03/2020 I have spent 29 minutes reviewing previous notes, test results and face to face (virtual) with the patient discussing the diagnosis and importance of compliance with the treatment plan as well as documenting on the day of the visit.    Noel Christmas, NP

## 2020-01-19 ENCOUNTER — Encounter

## 2020-01-19 MED ORDER — CYCLOBENZAPRINE 10 MG TAB
10 mg | ORAL_TABLET | Freq: Every evening | ORAL | 1 refills | Status: DC
Start: 2020-01-19 — End: 2020-02-22

## 2020-01-19 NOTE — Telephone Encounter (Signed)
Pt requesting a refill.

## 2020-02-22 ENCOUNTER — Ambulatory Visit: Attending: Nurse Practitioner | Primary: Nurse Practitioner

## 2020-02-22 ENCOUNTER — Ambulatory Visit
Admit: 2020-02-22 | Discharge: 2020-02-22 | Payer: PRIVATE HEALTH INSURANCE | Attending: Nurse Practitioner | Primary: Nurse Practitioner

## 2020-02-22 DIAGNOSIS — I10 Essential (primary) hypertension: Secondary | ICD-10-CM

## 2020-02-22 MED ORDER — CYCLOBENZAPRINE 10 MG TAB
10 mg | ORAL_TABLET | Freq: Every evening | ORAL | 1 refills | Status: DC
Start: 2020-02-22 — End: 2020-05-23

## 2020-02-22 MED ORDER — LISINOPRIL 20 MG TAB
20 mg | ORAL_TABLET | Freq: Every day | ORAL | 2 refills | Status: DC
Start: 2020-02-22 — End: 2020-05-23

## 2020-02-22 MED ORDER — LEVOTHYROXINE 75 MCG TAB
75 mcg | ORAL_TABLET | Freq: Every day | ORAL | 2 refills | Status: DC
Start: 2020-02-22 — End: 2020-05-23

## 2020-02-22 MED ORDER — METOPROLOL SUCCINATE SR 50 MG 24 HR TAB
50 mg | ORAL_TABLET | Freq: Every day | ORAL | 2 refills | Status: DC
Start: 2020-02-22 — End: 2020-05-23

## 2020-02-22 NOTE — Progress Notes (Signed)
Patient notified and verbalized understanding

## 2020-02-22 NOTE — Progress Notes (Signed)
Results to patient please.  Thyroid is normal.  Glucose is 96.  Sodium is slightly elevated.  Please decrease sodium in diet.  Liver enzymes are normal hemoglobin is 14.3.  Total cholesterol 200, triglycerides 304, HDL is 43, LDL is 105. Has she been on a low cholesterol diet?  If not, please encourage this.  If so, please let me know.  Thanks

## 2020-02-22 NOTE — Progress Notes (Signed)
Pap is normal    Thanks

## 2020-02-22 NOTE — Progress Notes (Signed)
Patient notified and verbalized understanding    Advise pt to start low cholesterol diet

## 2020-02-22 NOTE — Progress Notes (Signed)
Progress Notes by Erskine Squibb, NP at 02/22/20 1600                Author: Erskine Squibb, NP  Service: --  Author Type: Nurse Practitioner       Filed: 02/22/20 1645  Encounter Date: 02/22/2020  Status: Signed          Editor: Erskine Squibb, NP (Nurse Practitioner)                                  [facility]   [address]   [place], [place] [address]    (ph) [phone] (fax) [phone]   Breslin Burklow L. Cothran-Pate APRN, FNP-C           Chief Complaint       Patient presents with        ?  Follow-up             3 month fu           64 yo Harmon comes in to repeat her pap smear that was unsatisfactory for evaluation due to epithelial abnormality  because of obscuring bacteria back in 06/21. She also needs labs while she is here.            Allergies        Allergen  Reactions         ?  Lipitor [Atorvastatin]  Other (comments)             Muscle pain         ?  Lortab [Hydrocodone-Acetaminophen]  Itching         ?  Penicillins  Hives             Past Medical History:        Diagnosis  Date         ?  Arthritis       ?  Hypertension       ?  Thyroid disease            hypothyroid             Family History         Problem  Relation  Age of Onset          ?  Lung Disease  Mother       ?  Asthma  Mother       ?  Liver Disease  Father       ?  Stroke  Brother            ?  Diabetes  Brother               Social History          Socioeconomic History         ?  Marital status:  DIVORCED              Spouse name:  Not on file         ?  Number of children:  Not on file     ?  Years of education:  Not on file     ?  Highest education level:  Not on file       Occupational History        ?  Not on file       Tobacco Use         ?  Smoking status:  Former Smoker              Packs/day:  1.00         Years:  15.00         Pack years:  15.00         Types:  Cigarettes         Quit date:  10/25/1996         Years since quitting:  23.3         ?  Smokeless  tobacco:  Former Network engineer and Sexual Activity         ?  Alcohol use:  No     ?  Drug use:  No     ?  Sexual activity:  Not on file        Other Topics  Concern        ?  Not on file       Social History Narrative        ?  Not on file          Social Determinants of Health          Financial Resource Strain:         ?  Difficulty of Paying Living Expenses: Not on file       Food Insecurity:         ?  Worried About Running Out of Food in the Last Year: Not on file     ?  Ran Out of Food in the Last Year: Not on file       Transportation Needs:         ?  Lack of Transportation (Medical): Not on file     ?  Lack of Transportation (Non-Medical): Not on file       Physical Activity:         ?  Days of Exercise per Week: Not on file     ?  Minutes of Exercise per Session: Not on file       Stress:         ?  Feeling of Stress : Not on file       Social Connections:         ?  Frequency of Communication with Friends and Family: Not on file     ?  Frequency of Social Gatherings with Friends and Family: Not on file     ?  Attends Religious Services: Not on file     ?  Active Member of Clubs or Organizations: Not on file     ?  Attends Banker Meetings: Not on file     ?  Marital Status: Not on file       Intimate Partner Violence:         ?  Fear of Current or Ex-Partner: Not on file     ?  Emotionally Abused: Not on file     ?  Physically Abused: Not on file     ?  Sexually Abused: Not on file       Housing Stability:         ?  Unable to Pay for Housing in the Last Year: Not on file     ?  Number of Places Lived in the Last Year: Not on file        ?  Unstable Housing in the Last Year: Not on  file             OB History                Gravida      4                Para      4                Term                       Preterm                       AB                       Living                                    SAB                       IAB                       Ectopic                        Molar                       Multiple                       Live Births                                             Past Surgical History:         Procedure  Laterality  Date          ?  HX APPENDECTOMY         ?  HX CERVICAL DISKECTOMY    09/16/06          Anterior C5-C6 diskectomy with decompression;  anterior cervical arthrodesis interbody technique;  anterior cervical instrumentation;  tricortical  iliac crest bone graft harvested through a separate incision.           ?  HX CHOLECYSTECTOMY              ?  HX TAH AND BSO                 Health Maintenance        Topic  Date Due         ?  Breast Cancer Screen Mammogram   08/12/2021     ?  DTaP/Tdap/Td series (2 - Td or Tdap)  09/26/2022     ?  Colorectal Cancer Screening Combo   11/27/2023     ?  Lipid Screen   11/22/2024     ?  Hepatitis C Screening   Completed     ?  Bone Densitometry (Dexa) Screening   Completed     ?  Shingrix Vaccine Age 26>   Completed     ?  Flu Vaccine   Completed     ?  COVID-19 Vaccine  Completed         ?  Pneumococcal 0-64 years   Aged Out              Current Outpatient Medications:    ?  metoprolol succinate (TOPROL-XL) 50 mg XL tablet, Take 1 Tablet by mouth daily., Disp: 30 Tablet, Rfl: 2   ?  lisinopriL (PRINIVIL, ZESTRIL) 20 mg tablet, Take 0.5 Tablets by mouth daily., Disp: 30 Tablet, Rfl: 2   ?  levothyroxine (SYNTHROID) 75 mcg tablet, Take 1 Tablet by mouth Daily (before breakfast)., Disp: 30 Tablet, Rfl: 2   ?  cyclobenzaprine (Flexeril) 10 mg tablet, Take 1 Tablet by mouth nightly., Disp: 30 Tablet, Rfl: 1   ?  aspirin 81 mg chewable tablet, Take  by mouth daily., Disp: , Rfl:    ?  multivitamin, tx-iron-ca-min (THERA-M w/ IRON) 9 mg iron-400 mcg tab tablet, Take 1 Tab by mouth daily., Disp: , Rfl:       Review of Systems    Constitutional: Negative.     HENT: Negative.     Eyes: Negative.     Respiratory: Negative.     Cardiovascular: Negative.     Gastrointestinal: Negative.     Genitourinary: Negative.      Musculoskeletal: Negative.     Skin: Negative.     Neurological: Negative.     Endo/Heme/Allergies: Negative.     Psychiatric/Behavioral: Negative.     All other systems reviewed and are negative.         Visit Vitals      BP  110/80 (BP 1 Location: Left arm, BP Patient Position: Sitting, BP Cuff Size: Large adult)     Pulse  95     Temp  98 ??F (36.7 ??C) (Oral)     Ht  5' (1.524 m)     Wt  176 lb 9.6 oz (80.1 kg)     SpO2  98%        BMI  34.49 kg/m??            Physical Exam      Constitutional: She appears well-developed and well-nourished.    HENT:    Head: Normocephalic.   Neck: Normal range of motion. Neck supple.    Cardiovascular: Normal rate.     Pulmonary/Chest: Effort normal and breath sounds normal.   Abdominal: Soft. Bowel sounds are normal.   Genitourinary:  There is no rash, tenderness, lesion or injury on the right labia. There is no rash, tenderness, lesion or injury on the left labia. No erythema, tenderness or bleeding in the vagina. No  foreign body in the vagina. No signs of injury around the vagina. No vaginal discharge found. Vaginal smear obtained.    Skin: Skin is warm, dry and intact. No cyanosis. Nails show no clubbing.   Psychiatric: She has a normal mood and affect. Her speech is normal and behavior is normal. Judgment and thought content normal. Cognition and memory are normal.    Nursing note and vitals reviewed.   ??         Assessment & Plan:      1. Primary hypertension   stable   - METABOLIC PANEL, COMPREHENSIVE   - CBC WITH AUTOMATED DIFF      metoprolol succinate (TOPROL-XL) 50 mg XL tablet; Take 1 Tablet by mouth daily.  Dispense: 30 Tablet; Refill: 2   - lisinopriL (PRINIVIL, ZESTRIL) 20 mg tablet; Take 0.5 Tablets by mouth daily.  Dispense: 30 Tablet;  Refill: 2      2. Mixed hyperlipidemia   stable   - METABOLIC PANEL, COMPREHENSIVE   - LIPID PANEL WITH LDL/HDL RATIO      3. Hypothyroidism, unspecified type   stable   - TSH AND FREE T4   - levothyroxine (SYNTHROID) 75 mcg tablet;  Take 1 Tablet by mouth Daily (before breakfast).  Dispense: 30 Tablet; Refill: 2      4. Abnormal vaginal Pap smear   - PAP IG, RFX APTIMA HPV ASCUS ([id number])      5. Night muscle spasms   stable   - cyclobenzaprine (Flexeril) 10 mg tablet; Take 1 Tablet by mouth nightly.  Dispense: 30 Tablet; Refill: 1         Greater than 50% counseling and/or coordination of care: the treatment regimen is extensive; detailed review. Will notify of labs. Recheck in 3 months. This note was dictated using dragon voice recognition software.  It has been proofread, but there  may still exist voice recognition errors that the author did not detect.            Signed By:  Erskine Squibb, NP           February 22, 2020

## 2020-02-23 LAB — CBC WITH AUTOMATED DIFF
ABS. BASOPHILS: 0 10*3/uL (ref 0.0–0.2)
ABS. EOSINOPHILS: 0.2 10*3/uL (ref 0.0–0.4)
ABS. IMM. GRANS.: 0 10*3/uL (ref 0.0–0.1)
ABS. MONOCYTES: 0.5 10*3/uL (ref 0.1–0.9)
ABS. NEUTROPHILS: 1.9 10*3/uL (ref 1.4–7.0)
Abs Lymphocytes: 3.2 10*3/uL — ABNORMAL HIGH (ref 0.7–3.1)
BASOPHILS: 1 %
EOSINOPHILS: 4 %
HCT: 41 % (ref 34.0–46.6)
HGB: 14.3 g/dL (ref 11.1–15.9)
IMMATURE GRANULOCYTES: 0 %
Lymphocytes: 54 %
MCH: 31.2 pg (ref 26.6–33.0)
MCHC: 34.9 g/dL (ref 31.5–35.7)
MCV: 90 fL (ref 79–97)
MONOCYTES: 9 %
NEUTROPHILS: 32 %
PLATELET: 250 10*3/uL (ref 150–450)
RBC: 4.58 x10E6/uL (ref 3.77–5.28)
RDW: 12.4 % (ref 11.7–15.4)
WBC: 6 10*3/uL (ref 3.4–10.8)

## 2020-02-23 LAB — METABOLIC PANEL, COMPREHENSIVE
A-G Ratio: 1.6 (ref 1.2–2.2)
ALT (SGPT): 26 IU/L (ref 0–32)
AST (SGOT): 20 IU/L (ref 0–40)
Albumin: 4 g/dL (ref 3.8–4.8)
Alk. phosphatase: 89 IU/L (ref 44–121)
BUN/Creatinine ratio: 15 (ref 12–28)
BUN: 11 mg/dL (ref 8–27)
Bilirubin, total: 0.2 mg/dL (ref 0.0–1.2)
CO2: 21 mmol/L (ref 20–29)
Calcium: 10 mg/dL (ref 8.7–10.3)
Chloride: 109 mmol/L — ABNORMAL HIGH (ref 96–106)
Creatinine: 0.71 mg/dL (ref 0.57–1.00)
GFR est AA: 105 mL/min/{1.73_m2} (ref >59)
GFR est non-AA: 91 mL/min/{1.73_m2} (ref >59)
GLOBULIN, TOTAL: 2.5 g/dL (ref 1.5–4.5)
Glucose: 96 mg/dL (ref 65–99)
Potassium: 4.3 mmol/L (ref 3.5–5.2)
Protein, total: 6.5 g/dL (ref 6.0–8.5)
Sodium: 145 mmol/L — ABNORMAL HIGH (ref 134–144)

## 2020-02-23 LAB — LIPID PANEL WITH LDL/HDL RATIO
Cholesterol, Total: 200 mg/dL — ABNORMAL HIGH (ref 100–199)
Cholesterol, total: 200 mg/dL — ABNORMAL HIGH (ref 100–199)
HDL Cholesterol: 43 mg/dL (ref >39)
HDL: 43 mg/dL (ref >39)
LDL Calculated: 105 mg/dL — ABNORMAL HIGH (ref 0–99)
LDL, calculated: 105 mg/dL — ABNORMAL HIGH (ref 0–99)
LDL/HDL Ratio: 2.4 ratio (ref 0.0–3.2)
LDL/HDL Ratio: 2.4 ratio (ref 0.0–3.2)
Triglyceride: 304 mg/dL — ABNORMAL HIGH (ref 0–149)
Triglycerides: 304 mg/dL — ABNORMAL HIGH (ref 0–149)
VLDL, calculated: 52 mg/dL — ABNORMAL HIGH (ref 5–40)
VLDL: 52 mg/dL — ABNORMAL HIGH (ref 5–40)

## 2020-02-23 LAB — PAP IG, RFX APTIMA HPV ASCUS (507800)
.: 0
LABCORP 019018: 0

## 2020-02-23 LAB — TSH AND FREE T4
T4, Free: 1.04 ng/dL (ref 0.82–1.77)
TSH: 1.94 u[IU]/mL (ref 0.450–4.500)

## 2020-02-23 LAB — COMPREHENSIVE METABOLIC PANEL
ALT: 26 IU/L (ref 0–32)
AST: 20 IU/L (ref 0–40)
Albumin/Globulin Ratio: 1.6 NA (ref 1.2–2.2)
Albumin: 4 g/dL (ref 3.8–4.8)
Alkaline Phosphatase: 89 IU/L (ref 44–121)
BUN: 11 mg/dL (ref 8–27)
Bun/Cre Ratio: 15 NA (ref 12–28)
CO2: 21 mmol/L (ref 20–29)
Calcium: 10 mg/dL (ref 8.7–10.3)
Chloride: 109 mmol/L — ABNORMAL HIGH (ref 96–106)
Creatinine: 0.71 mg/dL (ref 0.57–1.00)
EGFR IF NonAfrican American: 91 mL/min/{1.73_m2} (ref >59)
GFR African American: 105 mL/min/{1.73_m2} (ref >59)
Globulin, Total: 2.5 g/dL (ref 1.5–4.5)
Glucose: 96 mg/dL (ref 65–99)
Potassium: 4.3 mmol/L (ref 3.5–5.2)
Sodium: 145 mmol/L — ABNORMAL HIGH (ref 134–144)
Total Bilirubin: 0.2 mg/dL (ref 0.0–1.2)
Total Protein: 6.5 g/dL (ref 6.0–8.5)

## 2020-02-23 LAB — CBC WITH AUTO DIFFERENTIAL
Basophils %: 1 %
Basophils Absolute: 0 10*3/uL (ref 0.0–0.2)
Eosinophils %: 4 %
Eosinophils Absolute: 0.2 10*3/uL (ref 0.0–0.4)
Granulocyte Absolute Count: 0 10*3/uL (ref 0.0–0.1)
Hematocrit: 41 % (ref 34.0–46.6)
Hemoglobin: 14.3 g/dL (ref 11.1–15.9)
Immature Granulocytes: 0 %
Lymphocytes %: 54 %
Lymphocytes Absolute: 3.2 10*3/uL — ABNORMAL HIGH (ref 0.7–3.1)
MCH: 31.2 pg (ref 26.6–33.0)
MCHC: 34.9 g/dL (ref 31.5–35.7)
MCV: 90 fL (ref 79–97)
Monocytes %: 9 %
Monocytes Absolute: 0.5 10*3/uL (ref 0.1–0.9)
Neutrophils %: 32 %
Neutrophils Absolute: 1.9 10*3/uL (ref 1.4–7.0)
Platelets: 250 10*3/uL (ref 150–450)
RBC: 4.58 x10E6/uL (ref 3.77–5.28)
RDW: 12.4 % (ref 11.7–15.4)
WBC: 6 10*3/uL (ref 3.4–10.8)

## 2020-02-23 LAB — TSH + FREE T4 PANEL
T4 Free: 1.04 ng/dL (ref 0.82–1.77)
TSH: 1.94 u[IU]/mL (ref 0.450–4.500)

## 2020-03-03 ENCOUNTER — Encounter

## 2020-03-03 ENCOUNTER — Telehealth

## 2020-03-03 ENCOUNTER — Institutional Professional Consult (permissible substitution): Payer: PRIVATE HEALTH INSURANCE | Primary: Nurse Practitioner

## 2020-03-03 DIAGNOSIS — Z20822 Contact with and (suspected) exposure to covid-19: Secondary | ICD-10-CM

## 2020-03-03 NOTE — Progress Notes (Signed)
Patient presented to the [facility] for COVID testing as ordered. After verbal consent was given by patient/cargiver, nasal swab obtained and sent to lab for processing.

## 2020-03-03 NOTE — Telephone Encounter (Signed)
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 Feeling stuffy sinus.

## 2020-03-03 NOTE — Telephone Encounter (Signed)
Orders Placed This Encounter   ??? NOVEL CORONAVIRUS (COVID-19)     Scheduling Instructions:      1) Due to current limited availability of the COVID-19 PCR test, tests will be prioritized and may not be completed.????            2) Order only if the test result will change clinical management or necessary for a return to mission-critical employment decision.????            3) Print and instruct patient to adhere to CDC home isolation program. (Link Above)????            4) Set up or refer patient for a monitoring program.????            5) Have patient sign up for and leverage MyChart (if not previously done).     Order Specific Question:   Is this test for diagnosis or screening?     Answer:   Diagnosis of ill patient     Order Specific Question:   Symptomatic for COVID-19 as defined by CDC?     Answer:   Yes     Order Specific Question:   Date of Symptom Onset     Answer:   03/02/2020     Order Specific Question:   Hospitalized for COVID-19?     Answer:   No     Order Specific Question:   Admitted to ICU for COVID-19?     Answer:   No     Order Specific Question:   Employed in healthcare setting?     Answer:   No     Order Specific Question:   Resident in a congregate (group) care setting?     Answer:   No     Order Specific Question:   Pregnant?     Answer:   No     Order Specific Question:   Previously tested for COVID-19?     Answer:   Unknown

## 2020-03-06 LAB — NOVEL CORONAVIRUS (COVID-19): SARS-CoV-2, NAA: DETECTED — AB

## 2020-03-06 LAB — SARS-COV-2, NAA 2 DAY TAT

## 2020-03-06 LAB — INPATIENT

## 2020-03-06 LAB — COVID-19: SARS-CoV-2, NAA: DETECTED — AB

## 2020-03-06 NOTE — Telephone Encounter (Signed)
Message left for patient to return my call.

## 2020-03-06 NOTE — Telephone Encounter (Signed)
Reviewed results and patient verbalizes understanding.

## 2020-03-06 NOTE — Telephone Encounter (Signed)
Please let patient know that she tested positive for COVID.  Current CDC recommendation is to quarantine for 5 days at home and then wear a mask for 5 more days as long as symptoms are mild.  Please contact office with any questions.

## 2020-05-23 ENCOUNTER — Ambulatory Visit: Attending: Nurse Practitioner | Primary: Nurse Practitioner

## 2020-05-23 ENCOUNTER — Ambulatory Visit
Admit: 2020-05-23 | Discharge: 2020-05-23 | Payer: PRIVATE HEALTH INSURANCE | Attending: Nurse Practitioner | Primary: Nurse Practitioner

## 2020-05-23 DIAGNOSIS — I10 Essential (primary) hypertension: Secondary | ICD-10-CM

## 2020-05-23 MED ORDER — CYCLOBENZAPRINE 10 MG TAB
10 mg | ORAL_TABLET | Freq: Every evening | ORAL | 1 refills | Status: AC
Start: 2020-05-23 — End: ?

## 2020-05-23 MED ORDER — LEVOTHYROXINE 75 MCG TAB
75 mcg | ORAL_TABLET | Freq: Every day | ORAL | 2 refills | Status: AC
Start: 2020-05-23 — End: ?

## 2020-05-23 MED ORDER — LISINOPRIL 20 MG TAB
20 mg | ORAL_TABLET | Freq: Every day | ORAL | 2 refills | Status: AC
Start: 2020-05-23 — End: ?

## 2020-05-23 MED ORDER — METOPROLOL SUCCINATE SR 50 MG 24 HR TAB
50 mg | ORAL_TABLET | Freq: Every day | ORAL | 2 refills | Status: DC
Start: 2020-05-23 — End: 2020-06-02

## 2020-05-23 NOTE — Progress Notes (Signed)
Results to patient please.  TSH is normal.  Glucose is 87.  Liver enzymes are normal.  Hemoglobin is 14.8.  Total cholesterol is 185, triglycerides 175, HDL is 51, and LDL is 104.  Please instruct on a low-carb diet.  Continue meds, keep scheduled follow-up

## 2020-05-23 NOTE — Progress Notes (Signed)
Progress Notes by Erskine Squibb, NP at 05/23/20 1600                Author: Erskine Squibb, NP  Service: --  Author Type: Nurse Practitioner       Filed: 05/23/20 1659  Encounter Date: 05/23/2020  Status: Signed          Editor: Erskine Squibb, NP (Nurse Practitioner)                                  [facility]   [address]   [place], [place] [address]    (ph) [phone] (fax) [phone]   Jalayna Josten L. Cothran-Pate APRN, FNP-C           Chief Complaint       Patient presents with        ?  Referral Request             ortho for back issues           65 year old female  comes in as a recheck to chronic issues and to get med refills.  . She has had her mammogram,  colonoscopy, and Dexa scan done. She denies smoking. She is c/o back pain and right hip pain. She denies injury and reports it may be arthritis. She is requesting to be referred to Dr. Delane Ginger in [place]([phone]) to have back evaluated. She never  went to have xrays back in 09/21.She does not wish to go back to [facility]. Pt is very concerned due to having episodes of dizziness over the past several months. Reports seeing cardiology last year and everything checked out ok. Denies headaches on a regular  basis.            Allergies        Allergen  Reactions         ?  Lipitor [Atorvastatin]  Other (comments)             Muscle pain         ?  Lortab [Hydrocodone-Acetaminophen]  Itching         ?  Penicillins  Hives             Past Medical History:        Diagnosis  Date         ?  Arthritis       ?  Hypertension       ?  Thyroid disease            hypothyroid             Family History         Problem  Relation  Age of Onset          ?  Lung Disease  Mother       ?  Asthma  Mother       ?  Liver Disease  Father       ?  Stroke  Brother            ?  Diabetes  Brother               Social History          Socioeconomic History         ?  Marital status:  DIVORCED  Spouse name:   Not on file         ?  Number of children:  Not on file     ?  Years of education:  Not on file     ?  Highest education level:  Not on file       Occupational History        ?  Not on file       Tobacco Use         ?  Smoking status:  Former Smoker              Packs/day:  1.00         Years:  15.00         Pack years:  15.00         Types:  Cigarettes         Quit date:  10/25/1996         Years since quitting:  23.5         ?  Smokeless tobacco:  Former Network engineerUser       Substance and Sexual Activity         ?  Alcohol use:  No     ?  Drug use:  No     ?  Sexual activity:  Not on file        Other Topics  Concern        ?  Not on file       Social History Narrative        ?  Not on file          Social Determinants of Health          Financial Resource Strain:         ?  Difficulty of Paying Living Expenses: Not on file       Food Insecurity:         ?  Worried About Running Out of Food in the Last Year: Not on file     ?  Ran Out of Food in the Last Year: Not on file       Transportation Needs:         ?  Lack of Transportation (Medical): Not on file     ?  Lack of Transportation (Non-Medical): Not on file       Physical Activity:         ?  Days of Exercise per Week: Not on file     ?  Minutes of Exercise per Session: Not on file       Stress:         ?  Feeling of Stress : Not on file       Social Connections:         ?  Frequency of Communication with Friends and Family: Not on file     ?  Frequency of Social Gatherings with Friends and Family: Not on file     ?  Attends Religious Services: Not on file     ?  Active Member of Clubs or Organizations: Not on file     ?  Attends BankerClub or Organization Meetings: Not on file     ?  Marital Status: Not on file       Intimate Partner Violence:         ?  Fear of Current or Ex-Partner: Not on file     ?  Emotionally Abused: Not on  file     ?  Physically Abused: Not on file     ?  Sexually Abused: Not on file       Housing Stability:         ?  Unable to Pay for Housing in the  Last Year: Not on file     ?  Number of Places Lived in the Last Year: Not on file        ?  Unstable Housing in the Last Year: Not on file             OB History                Gravida      4                Para      4                Term                       Preterm                       AB                       Living                                    SAB                       IAB                       Ectopic                       Molar                       Multiple                       Live Births                                             Past Surgical History:         Procedure  Laterality  Date          ?  HX APPENDECTOMY         ?  HX CERVICAL DISKECTOMY    09/16/06          Anterior C5-C6 diskectomy with decompression;  anterior cervical arthrodesis interbody technique;  anterior cervical instrumentation;  tricortical  iliac crest bone graft harvested through a separate incision.           ?  HX CHOLECYSTECTOMY              ?  HX TAH AND BSO                 Health Maintenance        Topic  Date Due         ?  Depression Screen   02/21/2021     ?  Breast Cancer Screen Mammogram   08/12/2021     ?  DTaP/Tdap/Td series (2 - Td or Tdap)  09/26/2022     ?  Colorectal Cancer Screening Combo   11/27/2023     ?  Lipid Screen   02/21/2025     ?  Hepatitis C Screening   Completed     ?  Bone Densitometry (Dexa) Screening   Completed     ?  Shingrix Vaccine Age 46>   Completed     ?  Flu Vaccine   Completed     ?  COVID-19 Vaccine   Completed         ?  Pneumococcal 0-64 years   Aged Out              Current Outpatient Medications:    ?  metoprolol succinate (TOPROL-XL) 50 mg XL tablet, Take 1 Tablet by mouth daily., Disp: 30 Tablet, Rfl: 2   ?  lisinopriL (PRINIVIL, ZESTRIL) 20 mg tablet, Take 0.5 Tablets by mouth daily., Disp: 30 Tablet, Rfl: 2   ?  levothyroxine (SYNTHROID) 75 mcg tablet, Take 1 Tablet by mouth Daily (before breakfast)., Disp: 30 Tablet, Rfl: 2   ?  cyclobenzaprine (Flexeril) 10 mg tablet,  Take 1 Tablet by mouth nightly., Disp: 30 Tablet, Rfl: 1   ?  aspirin 81 mg chewable tablet, Take  by mouth daily., Disp: , Rfl:    ?  multivitamin, tx-iron-ca-min (THERA-M w/ IRON) 9 mg iron-400 mcg tab tablet, Take 1 Tab by mouth daily., Disp: , Rfl:       Review of Systems    Constitutional: Negative.     HENT: Negative.     Eyes: Negative.     Respiratory: Negative.     Cardiovascular: Negative.     Gastrointestinal: Negative.     Genitourinary: Negative.     Musculoskeletal: Positive for back pain.         Right hip pain    Skin: Negative.     Neurological: Positive for dizziness.    Endo/Heme/Allergies: Negative.     Psychiatric/Behavioral: Negative.     All other systems reviewed and are negative.         Visit Vitals      BP  120/80     Pulse  72     Temp  98.1 ??F (36.7 ??C) (Oral)     Ht  5' (1.524 m)     Wt  180 lb 9.6 oz (81.9 kg)     SpO2  100%        BMI  35.27 kg/m??            Physical Exam   Constitutional :        Appearance: She is well-developed.   HENT :       Head: Normocephalic.   Eyes :       Pupils: Pupils are equal, round, and reactive to light.   Cardiovascular:       Rate and Rhythm: Normal rate and regular rhythm.      Heart sounds: No murmur heard.   No friction rub. No gallop.     Pulmonary:       Effort: Pulmonary effort is normal.      Breath sounds: Normal breath sounds.   Abdominal :      General: Bowel sounds are normal.      Palpations: Abdomen is soft.     Musculoskeletal:          General: Normal range of motion.  Cervical back: Normal range of motion.    Skin:      General: Skin is warm and dry.   Neurological :       Mental Status: She is alert and oriented to person, place, and time.         PHQ2=0      Assessment & Plan:      1. Essential hypertension   - metoprolol succinate (TOPROL-XL) 50 mg XL tablet; Take 1 Tablet by mouth daily.  Dispense: 30 Tablet; Refill: 2   - lisinopriL (PRINIVIL, ZESTRIL) 20 mg tablet; Take 0.5 Tablets by mouth daily.  Dispense: 30 Tablet;  Refill: 2   - METABOLIC PANEL, COMPREHENSIVE   - CBC WITH AUTOMATED DIFF      2. Hypothyroidism, unspecified type   - levothyroxine (SYNTHROID) 75 mcg tablet; Take 1 Tablet by mouth Daily (before breakfast).  Dispense: 30 Tablet; Refill: 2   - TSH AND FREE T4      3. Night muscle spasms   - cyclobenzaprine (Flexeril) 10 mg tablet; Take 1 Tablet by mouth nightly.  Dispense: 30 Tablet; Refill: 1      4. 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 Hip pain   - XR HIP RT W OR WO PELV 2-3 VWS; Future      7. Dizziness   - MRI BRAIN WO CONT; Future      8. Gait disturbance   - MRI BRAIN WO CONT; Future         Greater than 50% counseling and/or coordination of care: the treatment regimen is extensive; detailed review. Will notify of labs/MRI.  Will schedule appt with cardiologist for recheck since this was due in 09/21. If carotid U/S is needed, can do through  cardiology's office. Will refer to ortho once we have xray report. If dizziness worsens, prior to MRI, will go to ER. She was not dizzy in office today. This note was dictated using dragon voice recognition software.  It has been proofread, but there  may still exist voice recognition errors that the author did not detect.            Signed By:  Erskine Squibb, NP           May 23, 2020

## 2020-05-23 NOTE — Progress Notes (Signed)
Reviewed results and patient verbalizes understanding.

## 2020-05-24 LAB — METABOLIC PANEL, COMPREHENSIVE
A-G Ratio: 2 (ref 1.2–2.2)
ALT (SGPT): 27 IU/L (ref 0–32)
AST (SGOT): 27 IU/L (ref 0–40)
Albumin: 4.5 g/dL (ref 3.8–4.8)
Alk. phosphatase: 88 IU/L (ref 44–121)
BUN/Creatinine ratio: 16 (ref 12–28)
BUN: 9 mg/dL (ref 8–27)
Bilirubin, total: 0.5 mg/dL (ref 0.0–1.2)
CO2: 20 mmol/L (ref 20–29)
Calcium: 9.5 mg/dL (ref 8.7–10.3)
Chloride: 107 mmol/L — ABNORMAL HIGH (ref 96–106)
Creatinine: 0.58 mg/dL (ref 0.57–1.00)
GLOBULIN, TOTAL: 2.3 g/dL (ref 1.5–4.5)
Glucose: 87 mg/dL (ref 65–99)
Potassium: 4.4 mmol/L (ref 3.5–5.2)
Protein, total: 6.8 g/dL (ref 6.0–8.5)
Sodium: 143 mmol/L (ref 134–144)
eGFR: 102 mL/min/{1.73_m2} (ref >59)

## 2020-05-24 LAB — TSH AND FREE T4
T4, Free: 1.07 ng/dL (ref 0.82–1.77)
TSH: 2.67 u[IU]/mL (ref 0.450–4.500)

## 2020-05-24 LAB — LIPID PANEL WITH LDL/HDL RATIO
Cholesterol, Total: 185 mg/dL (ref 100–199)
Cholesterol, total: 185 mg/dL (ref 100–199)
HDL Cholesterol: 51 mg/dL (ref >39)
HDL: 51 mg/dL (ref >39)
LDL Calculated: 104 mg/dL — ABNORMAL HIGH (ref 0–99)
LDL, calculated: 104 mg/dL — ABNORMAL HIGH (ref 0–99)
LDL/HDL Ratio: 2 ratio (ref 0.0–3.2)
LDL/HDL Ratio: 2 ratio (ref 0.0–3.2)
Triglyceride: 175 mg/dL — ABNORMAL HIGH (ref 0–149)
Triglycerides: 175 mg/dL — ABNORMAL HIGH (ref 0–149)
VLDL, calculated: 30 mg/dL (ref 5–40)
VLDL: 30 mg/dL (ref 5–40)

## 2020-05-24 LAB — CBC WITH AUTOMATED DIFF
ABS. BASOPHILS: 0 10*3/uL (ref 0.0–0.2)
ABS. EOSINOPHILS: 0.2 10*3/uL (ref 0.0–0.4)
ABS. IMM. GRANS.: 0 10*3/uL (ref 0.0–0.1)
ABS. MONOCYTES: 0.5 10*3/uL (ref 0.1–0.9)
ABS. NEUTROPHILS: 1.9 10*3/uL (ref 1.4–7.0)
Abs Lymphocytes: 3.1 10*3/uL (ref 0.7–3.1)
BASOPHILS: 1 %
EOSINOPHILS: 4 %
HCT: 42.2 % (ref 34.0–46.6)
HGB: 14.8 g/dL (ref 11.1–15.9)
IMMATURE GRANULOCYTES: 0 %
Lymphocytes: 54 %
MCH: 31.8 pg (ref 26.6–33.0)
MCHC: 35.1 g/dL (ref 31.5–35.7)
MCV: 91 fL (ref 79–97)
MONOCYTES: 8 %
NEUTROPHILS: 33 %
PLATELET: 247 10*3/uL (ref 150–450)
RBC: 4.65 x10E6/uL (ref 3.77–5.28)
RDW: 12.9 % (ref 11.7–15.4)
WBC: 5.7 10*3/uL (ref 3.4–10.8)

## 2020-05-24 LAB — CBC WITH AUTO DIFFERENTIAL
Basophils %: 1 %
Basophils Absolute: 0 10*3/uL (ref 0.0–0.2)
Eosinophils %: 4 %
Eosinophils Absolute: 0.2 10*3/uL (ref 0.0–0.4)
Granulocyte Absolute Count: 0 10*3/uL (ref 0.0–0.1)
Hematocrit: 42.2 % (ref 34.0–46.6)
Hemoglobin: 14.8 g/dL (ref 11.1–15.9)
Immature Granulocytes: 0 %
Lymphocytes %: 54 %
Lymphocytes Absolute: 3.1 10*3/uL (ref 0.7–3.1)
MCH: 31.8 pg (ref 26.6–33.0)
MCHC: 35.1 g/dL (ref 31.5–35.7)
MCV: 91 fL (ref 79–97)
Monocytes %: 8 %
Monocytes Absolute: 0.5 10*3/uL (ref 0.1–0.9)
Neutrophils %: 33 %
Neutrophils Absolute: 1.9 10*3/uL (ref 1.4–7.0)
Platelets: 247 10*3/uL (ref 150–450)
RBC: 4.65 x10E6/uL (ref 3.77–5.28)
RDW: 12.9 % (ref 11.7–15.4)
WBC: 5.7 10*3/uL (ref 3.4–10.8)

## 2020-05-24 LAB — COMPREHENSIVE METABOLIC PANEL
ALT: 27 IU/L (ref 0–32)
AST: 27 IU/L (ref 0–40)
Albumin/Globulin Ratio: 2 NA (ref 1.2–2.2)
Albumin: 4.5 g/dL (ref 3.8–4.8)
Alkaline Phosphatase: 88 IU/L (ref 44–121)
BUN: 9 mg/dL (ref 8–27)
Bun/Cre Ratio: 16 NA (ref 12–28)
CO2: 20 mmol/L (ref 20–29)
Calcium: 9.5 mg/dL (ref 8.7–10.3)
Chloride: 107 mmol/L — ABNORMAL HIGH (ref 96–106)
Creatinine: 0.58 mg/dL (ref 0.57–1.00)
Est, Glomerular Filtration Rate: 102 mL/min/{1.73_m2} (ref >59)
Globulin, Total: 2.3 g/dL (ref 1.5–4.5)
Glucose: 87 mg/dL (ref 65–99)
Potassium: 4.4 mmol/L (ref 3.5–5.2)
Sodium: 143 mmol/L (ref 134–144)
Total Bilirubin: 0.5 mg/dL (ref 0.0–1.2)
Total Protein: 6.8 g/dL (ref 6.0–8.5)

## 2020-05-24 LAB — TSH + FREE T4 PANEL
T4 Free: 1.07 ng/dL (ref 0.82–1.77)
TSH: 2.67 u[IU]/mL (ref 0.450–4.500)

## 2020-05-25 NOTE — Telephone Encounter (Signed)
Saw PCP having dizzy spells and PCP advised fu w/cardiology.HR does get up to 125 at rest.This happens off/on.Dizziness does not coincide w/high HR's is random.PCP is sending for MRI of head.She would like her to have carotid.Pt.calls for an appt.    I made next available w/Dr.Bittrick 4/29 /EA and sent note to Dr.Bittrick to see if he wants to order carotid/heart monitor prior to appt.

## 2020-05-25 NOTE — Telephone Encounter (Signed)
Appt.made 4/8 12:30/MA pt.notified and v/u.

## 2020-05-25 NOTE — Telephone Encounter (Signed)
Just work her in whenever.  After I see her I can decide if I need any tests

## 2020-05-25 NOTE — Telephone Encounter (Signed)
PCP wants her to see Dr Nunzio Cory.ASAP  Dizzy spells and not feeling well. Please call

## 2020-05-26 NOTE — Telephone Encounter (Signed)
-----   Message from Marisue Humble sent at 05/26/2020  4:35 PM EDT -----  Subject: Message to Provider    QUESTIONS  Information for Provider? Cesar from [facility] called with   APPROVED status on order for MRI Brain Scan w/out Contrast for this   patient. Authorization #[id number] valid for 30 days starting today.   ---------------------------------------------------------------------------  --------------  CALL BACK INFO  What is the best way for the office to contact you? Do not leave any   message, patient will call back for answer  Preferred Call Back Phone Number? [phone]  ---------------------------------------------------------------------------  --------------  SCRIPT ANSWERS  Relationship to Patient? Third Party  Third Party Type? Insurance?   Representative Name? Cesar from [employer]

## 2020-06-01 ENCOUNTER — Inpatient Hospital Stay: Admit: 2020-06-01 | Payer: PRIVATE HEALTH INSURANCE | Attending: Nurse Practitioner | Primary: Nurse Practitioner

## 2020-06-01 DIAGNOSIS — R42 Dizziness and giddiness: Secondary | ICD-10-CM

## 2020-06-01 NOTE — Progress Notes (Signed)
Reviewed results and patient verbalizes understanding.

## 2020-06-01 NOTE — Progress Notes (Signed)
MRI results to pt.  IMPRESSION  ??  No acute or subacute appearing intracranial abnormality.  ??

## 2020-06-02 ENCOUNTER — Ambulatory Visit: Attending: Cardiovascular Disease | Primary: Nurse Practitioner

## 2020-06-02 ENCOUNTER — Ambulatory Visit
Admit: 2020-06-02 | Discharge: 2020-06-02 | Payer: PRIVATE HEALTH INSURANCE | Attending: Cardiovascular Disease | Primary: Nurse Practitioner

## 2020-06-02 DIAGNOSIS — I10 Essential (primary) hypertension: Secondary | ICD-10-CM

## 2020-06-02 MED ORDER — METOPROLOL SUCCINATE SR 50 MG 24 HR TAB
50 mg | ORAL_TABLET | Freq: Two times a day (BID) | ORAL | 3 refills | Status: AC
Start: 2020-06-02 — End: ?

## 2020-06-02 NOTE — Progress Notes (Signed)
[facility]  [address]  [place] [address]  PHONE: [phone]    SUBJECTIVE: Sandra Harmon  Sandra Harmon (1955/06/24) is a 65 y.o. female seen for a follow up visit regarding the following:        ICD-10-CM ICD-9-CM   1. Essential hypertension  I10 401.9   2. Hypothyroidism, unspecified type  E03.9 244.9   3. 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  CBC and metabolic panel are unremarkable.  She presents today with complaints of worsening tachycardia.    Patient is again manifesting periodic symptoms of her syndrome of inappropriate sinus tachycardia.  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  We will just need to increase her beta-blocker to deal with the episodes of tachycardia      Past Medical History, Past Surgical History, Family history, Social History, and Medications were all reviewed with the patient today and updated as necessary.      Allergies   Allergen Reactions   ??? Lipitor [Atorvastatin] Other (comments)     Muscle pain   ??? Lortab [Hydrocodone-Acetaminophen] Itching   ??? Penicillins Hives     Past Medical History:   Diagnosis Date   ??? Arthritis    ??? Hypertension    ??? Thyroid disease     hypothyroid     Past Surgical History:   Procedure Laterality Date   ??? HX APPENDECTOMY     ??? HX CERVICAL DISKECTOMY  09/16/06    Anterior C5-C6 diskectomy with decompression;  anterior cervical arthrodesis interbody technique;  anterior cervical instrumentation;  tricortical iliac crest bone graft harvested through a separate incision.    ??? HX CHOLECYSTECTOMY     ??? HX TAH AND BSO       Family History   Problem Relation Age of Onset   ??? Lung Disease Mother    ??? Asthma Mother    ??? Liver Disease Father    ??? Stroke Brother    ??? Diabetes Brother       Social History     Tobacco Use   ??? Smoking status: Former Smoker      Packs/day: 1.00     Years: 15.00     Pack years: 15.00     Types: Cigarettes     Quit date: 10/25/1996     Years since quitting: 23.6   ??? Smokeless tobacco: Former Chief Strategy Officer Use Topics   ??? Alcohol use: No     Pt does   have history of early onset cad or heart failure in immediate family members    Current Outpatient Medications:   ???  metoprolol succinate (TOPROL-XL) 50 mg XL tablet, Take 1 Tablet by mouth two (2) times a day., Disp: 180 Tablet, Rfl: 3  ???  lisinopriL (PRINIVIL, ZESTRIL) 20 mg tablet, Take 0.5 Tablets by mouth daily., Disp: 30 Tablet, Rfl: 2  ???  levothyroxine (SYNTHROID) 75 mcg tablet, Take 1 Tablet by mouth Daily (before breakfast)., Disp: 30 Tablet, Rfl: 2  ???  cyclobenzaprine (Flexeril) 10 mg tablet, Take 1 Tablet by mouth nightly., Disp: 30 Tablet, Rfl: 1  ???  aspirin 81 mg chewable tablet, Take  by mouth daily., Disp: , Rfl:   ???  multivitamin, tx-iron-ca-min (THERA-M w/ IRON) 9 mg iron-400 mcg tab tablet, Take 1 Tab by  mouth daily., Disp: , Rfl:         ROS:  Review of Systems - General ROS: negative for - chills, fatigue or fever  Hematological and Lymphatic ROS: negative for - bleeding problems, bruising or jaundice  Respiratory ROS: no cough, shortness of breath, or wheezing  Cardiovascular ROS: no chest pain or dyspnea on exertion  Gastrointestinal ROS: no abdominal pain, change in bowel habits, or black or bloody stools  Neurological ROS: no TIA or stroke symptoms  All other systems negative.      Wt Readings from Last 3 Encounters:   06/02/20 178 lb (80.7 kg)   05/23/20 180 lb 9.6 oz (81.9 kg)   02/22/20 176 lb 9.6 oz (80.1 kg)     Temp Readings from Last 3 Encounters:   05/23/20 98.1 ??F (36.7 ??C) (Oral)   02/22/20 98 ??F (36.7 ??C) (Oral)   12/08/19 98.2 ??F (36.8 ??C)     BP Readings from Last 3 Encounters:   06/02/20 132/88   05/23/20 120/80   02/22/20 110/80     Pulse Readings from Last 3 Encounters:   06/02/20 78   05/23/20 72   02/22/20 95           PHYSICAL EXAM:  Visit  Vitals  BP 132/88   Pulse 78   Ht 5' (1.524 m)   Wt 178 lb (80.7 kg) Comment: with shoes   BMI 34.76 kg/m??       Physical Examination: General appearance - alert, well appearing, and in no distress  Mental status - alert, oriented to person, place, and time  Eyes - pupils equal and reactive, extraocular eye movements intact  Neck/lymph - supple, no significant adenopathy  Chest/lungs - clear to auscultation, no wheezes, rales or rhonchi, symmetric air entry  Heart/CV - normal rate, regular rhythm, normal S1, S2, no murmurs, rubs, clicks or gallops  Abdomen/GI - soft, nontender, nondistended, no masses or organomegaly  Musculoskeletal - no joint tenderness, deformity or swelling  Extremities - peripheral pulses normal, no pedal edema, no clubbing or cyanosis  Skin - normal coloration and turgor, no rashes, no suspicious skin lesions noted    EKG: normal EKG, normal sinus rhythm, unchanged from previous tracings.                   Medications reviewed and questions answered    Recent Results (from the past 672 hour(s))   TSH AND FREE T4    Collection Time: 05/23/20  4:33 PM   Result Value Ref Range    TSH 2.670 0.450 - 4.500 uIU/mL    T4, Free 1.07 0.82 - 5.95 ng/dL   METABOLIC PANEL, COMPREHENSIVE    Collection Time: 05/23/20  4:33 PM   Result Value Ref Range    Glucose 87 65 - 99 mg/dL    BUN 9 8 - 27 mg/dL    Creatinine 0.58 0.57 - 1.00 mg/dL    eGFR 102 >59 mL/min/1.73    BUN/Creatinine ratio 16 12 - 28    Sodium 143 134 - 144 mmol/L    Potassium 4.4 3.5 - 5.2 mmol/L    Chloride 107 (H) 96 - 106 mmol/L    CO2 20 20 - 29 mmol/L    Calcium 9.5 8.7 - 10.3 mg/dL    Protein, total 6.8 6.0 - 8.5 g/dL    Albumin 4.5 3.8 - 4.8 g/dL    GLOBULIN, TOTAL 2.3 1.5 - 4.5 g/dL    A-G Ratio 2.0 1.2 -  2.2    Bilirubin, total 0.5 0.0 - 1.2 mg/dL    Alk. phosphatase 88 44 - 121 IU/L    AST (SGOT) 27 0 - 40 IU/L    ALT (SGPT) 27 0 - 32 IU/L   CBC WITH AUTOMATED DIFF    Collection Time: 05/23/20  4:33 PM   Result Value Ref Range    WBC  5.7 3.4 - 10.8 x10E3/uL    RBC 4.65 3.77 - 5.28 x10E6/uL    HGB 14.8 11.1 - 15.9 g/dL    HCT 42.2 34.0 - 46.6 %    MCV 91 79 - 97 fL    MCH 31.8 26.6 - 33.0 pg    MCHC 35.1 31.5 - 35.7 g/dL    RDW 12.9 11.7 - 15.4 %    PLATELET 247 150 - 450 x10E3/uL    NEUTROPHILS 33 Not Estab. %    Lymphocytes 54 Not Estab. %    MONOCYTES 8 Not Estab. %    EOSINOPHILS 4 Not Estab. %    BASOPHILS 1 Not Estab. %    ABS. NEUTROPHILS 1.9 1.4 - 7.0 x10E3/uL    Abs Lymphocytes 3.1 0.7 - 3.1 x10E3/uL    ABS. MONOCYTES 0.5 0.1 - 0.9 x10E3/uL    ABS. EOSINOPHILS 0.2 0.0 - 0.4 x10E3/uL    ABS. BASOPHILS 0.0 0.0 - 0.2 x10E3/uL    IMMATURE GRANULOCYTES 0 Not Estab. %    ABS. IMM. GRANS. 0.0 0.0 - 0.1 x10E3/uL   LIPID PANEL WITH LDL/HDL RATIO    Collection Time: 05/23/20  4:33 PM   Result Value Ref Range    Cholesterol, total 185 100 - 199 mg/dL    Triglyceride 175 (H) 0 - 149 mg/dL    HDL Cholesterol 51 >39 mg/dL    VLDL, calculated 30 5 - 40 mg/dL    LDL, calculated 104 (H) 0 - 99 mg/dL    LDL/HDL Ratio 2.0 0.0 - 3.2 ratio     Lab Results   Component Value Date/Time    Cholesterol, total 185 05/23/2020 04:33 PM    HDL Cholesterol 51 05/23/2020 04:33 PM    LDL, calculated 104 (H) 05/23/2020 04:33 PM    LDL, calculated 86 07/29/2018 08:10 AM    VLDL, calculated 30 05/23/2020 04:33 PM    VLDL, calculated 30 07/29/2018 08:10 AM    Triglyceride 175 (H) 05/23/2020 04:33 PM         ASSESSMENT and PLAN        1. Essential hypertension  The current medical regimen is effective;  continue present plan and medications.    - AMB POC EKG ROUTINE W/ 12 LEADS, INTER & REP    2. Hypothyroidism, unspecified type  The current medical regimen is effective;  continue present plan and medications.      3. Inappropriate sinus node tachycardia  The current medical regimen is effective;  continue present plan and medications.    We will increase Toprol-XL to 50 mg p.o. twice daily we will follow up in 4 weeks to assess efficacy of the increased Toprol  dose      Orders Placed This Encounter   ??? AMB POC EKG ROUTINE W/ 12 LEADS, INTER & REP     Order Specific Question:   Reason for Exam:     Answer:   See diagnosis   ??? metoprolol succinate (TOPROL-XL) 50 mg XL tablet     Sig: Take 1 Tablet by mouth two (2) times a day.  Dispense:  180 Tablet     Refill:  3       Pt is instructed to follow all appropriate cardiac risk factor recommendations and to be compliant with meds and testing. Instructed to follow up appropriately and seek urgent medical care if acute or unstable issues arise. Results of some tests may be viewed thru [place] but this does not substitute for follow up with MD. If follow up is not scheduled pt is instructed to call for follow up      Follow-up and Dispositions    ?? Return in about 4 weeks (around 06/30/2020).      is for   Specialty Problems     None                 Jens Som, MD  06/02/2020  1:01 PM

## 2020-06-23 ENCOUNTER — Encounter: Attending: Cardiovascular Disease | Primary: Nurse Practitioner

## 2020-07-26 ENCOUNTER — Encounter: Attending: Cardiovascular Disease | Primary: Nurse Practitioner

## 2020-07-28 ENCOUNTER — Inpatient Hospital Stay: Admit: 2020-07-28 | Payer: PRIVATE HEALTH INSURANCE | Primary: Nurse Practitioner

## 2020-07-28 ENCOUNTER — Encounter

## 2020-07-28 DIAGNOSIS — M5441 Lumbago with sciatica, right side: Secondary | ICD-10-CM

## 2020-07-31 LAB — COVID-19: SARS-CoV-2: DETECTED

## 2020-08-19 ENCOUNTER — Inpatient Hospital Stay: Admit: 2020-08-19 | Payer: PRIVATE HEALTH INSURANCE | Primary: Nurse Practitioner

## 2020-08-19 DIAGNOSIS — Z1231 Encounter for screening mammogram for malignant neoplasm of breast: Secondary | ICD-10-CM

## 2020-08-22 ENCOUNTER — Encounter: Attending: Nurse Practitioner | Primary: Nurse Practitioner

## 2020-08-29 NOTE — Telephone Encounter (Signed)
Pt requesting a refill next apt 7/20

## 2020-08-29 NOTE — Telephone Encounter (Signed)
 Formatting of this note might be different from the original.  Pt requesting a refill next apt 7/20  Electronically signed by Interface, Incoming [facility] Source Conversion at 11/22/2020  4:37 AM EDT

## 2020-08-29 NOTE — Telephone Encounter (Signed)
Formatting of this note might be different from the original.  Pt requesting a refill next apt 7/20  Electronically signed by Interface, Incoming [facility] Source Conversion at 11/22/2020  4:37 AM EDT

## 2020-08-30 MED ORDER — CYCLOBENZAPRINE HCL 10 MG PO TABS
10 MG | ORAL_TABLET | Freq: Every day | ORAL | 0 refills | Status: DC
Start: 2020-08-30 — End: 2020-09-13

## 2020-09-13 ENCOUNTER — Ambulatory Visit
Admit: 2020-09-13 | Discharge: 2020-09-13 | Payer: PRIVATE HEALTH INSURANCE | Attending: Nurse Practitioner | Primary: Nurse Practitioner

## 2020-09-13 DIAGNOSIS — I10 Essential (primary) hypertension: Secondary | ICD-10-CM

## 2020-09-13 MED ORDER — LEVOTHYROXINE SODIUM 75 MCG PO TABS
75 MCG | ORAL_TABLET | Freq: Every day | ORAL | 1 refills | Status: DC
Start: 2020-09-13 — End: 2021-01-09

## 2020-09-13 MED ORDER — CYCLOBENZAPRINE HCL 10 MG PO TABS
10 MG | ORAL_TABLET | Freq: Every day | ORAL | 2 refills | Status: DC
Start: 2020-09-13 — End: 2021-01-09

## 2020-09-13 NOTE — Progress Notes (Signed)
[facility]  [address]  [place], [place] [address]   (ph) [phone] (fax) [phone]  Ever Halberg L. Cothran-Pate APRN, FNP-C      Chief Complaint   Patient presents with    Follow-up     3 month fu       65 year old female  comes in as a recheck to chronic issues and to get med refills.  . She has had her mammogram,  colonoscopy, and Dexa scan done. She denies smoking. She is still  c/o back pain and right hip pain. She denies injury and reports it may be arthritis. She is requesting to be referred to Dr. Delane Ginger in [phone]) to have back evaluated. She never  went to have xrays back in 09/21.She does not wish to go back to [facility]. Xray from June revealed that there is no evidence of fracture or subluxation. The vertebrae are well aligned. No bony lesions are seen. Degenerative disc and facet changes noted at L5-S1.             Allergies   Allergen Reactions    Atorvastatin Other (See Comments)     Muscle pain    Hydrocodone-Acetaminophen Itching    Penicillins Hives       Past Medical History:   Diagnosis Date    Arthritis     Hypertension     Thyroid disease     hypothyroid       Family History   Problem Relation Age of Onset    Stroke Brother     Liver Disease Father     Asthma Mother     Lung Disease Mother     Diabetes Brother        Social History     Socioeconomic History    Marital status: Divorced     Spouse name: Not on file    Number of children: Not on file    Years of education: Not on file    Highest education level: Not on file   Occupational History    Not on file   Tobacco Use    Smoking status: Former     Packs/day: 1.00     Types: Cigarettes     Quit date: 10/25/1996     Years since quitting: 23.9    Smokeless tobacco: Former   Substance and Sexual Activity    Alcohol use: No    Drug use: No    Sexual activity: Not on file   Other Topics Concern    Not on file   Social History Narrative    Not on file     Social Determinants of Health      Financial Resource Strain: Low Risk     Difficulty of Paying Living Expenses: Not very hard   Food Insecurity: No Food Insecurity    Worried About Programme researcher, broadcasting/film/video in the Last Year: Never true    Ran Out of Food in the Last Year: Never true   Transportation Needs: No Transportation Needs    Lack of Transportation (Medical): No    Lack of Transportation (Non-Medical): No   Physical Activity: Not on file   Stress: Not on file   Social Connections: Not on file   Intimate Partner Violence: Not on file   Housing Stability: Not on file       OB History       Gravida   4    Para  Term        Preterm        AB        Living             SAB        IAB        Ectopic        Molar        Multiple        Live Births                    Past Surgical History:   Procedure Laterality Date    APPENDECTOMY      CERVICAL DISCECTOMY  09/16/06    Anterior C5-C6 diskectomy with decompression;  anterior cervical arthrodesis interbody technique;  anterior cervical instrumentation;  tricortical iliac crest bone graft harvested through a separate incision.     CHOLECYSTECTOMY      TAH AND BSO (CERVIX REMOVED)         Health Maintenance   Topic Date Due    HIV screen  Never done    Annual Wellness Visit (AWV)  02/10/2020    COVID-19 Vaccine (4 - Booster for [employer] series) 04/18/2020    Flu vaccine (1) 10/26/2020    Depression Screen  05/23/2021    Breast cancer screen  08/20/2022    DTaP/Tdap/Td vaccine (2 - Td or Tdap) 09/26/2022    Lipids  05/23/2025    Colorectal Cancer Screen  11/26/2028    Shingles vaccine  Completed    Hepatitis C screen  Completed    Hepatitis A vaccine  Aged Out    Hepatitis B vaccine  Aged Out    Hib vaccine  Aged Out    Meningococcal (ACWY) vaccine  Aged Out    Pneumococcal 0-64 years Vaccine  Aged Out         Current Outpatient Medications:     cyclobenzaprine (FLEXERIL) 10 MG tablet, Take 1 tablet by mouth daily, Disp: 30 tablet, Rfl: 0    aspirin 81 MG chewable tablet, Take by mouth daily, Disp: ,  Rfl:     levothyroxine (SYNTHROID) 75 MCG tablet, Take 75 mcg by mouth every morning (before breakfast), Disp: , Rfl:     lisinopril (PRINIVIL;ZESTRIL) 20 MG tablet, Take 10 mg by mouth daily, Disp: , Rfl:     metoprolol succinate (TOPROL XL) 50 MG extended release tablet, Take 50 mg by mouth 2 times daily, Disp: , Rfl:     Review of Systems   Constitutional: Negative.    HENT: Negative.     Eyes: Negative.    Respiratory: Negative.     Cardiovascular: Negative.    Gastrointestinal: Negative.    Endocrine: Negative.    Genitourinary: Negative.    Musculoskeletal:  Positive for back pain.   Skin: Negative.    Allergic/Immunologic: Negative.    Neurological: Negative.    Hematological: Negative.    Psychiatric/Behavioral: Negative.        Vitals:    09/13/20 1519   BP: 110/74   Pulse: 84   Temp: 98.1 ??F (36.7 ??C)   SpO2: 97%        Physical Exam  Constitutional:       Appearance: Normal appearance.   HENT:      Head: Normocephalic.      Nose: Nose normal.   Eyes:      Extraocular Movements: Extraocular movements intact.      Pupils: Pupils are equal, round, and reactive  to light.   Cardiovascular:      Rate and Rhythm: Normal rate and regular rhythm.   Pulmonary:      Effort: Pulmonary effort is normal.      Breath sounds: Normal breath sounds.   Abdominal:      General: Abdomen is flat.      Palpations: Abdomen is soft.   Musculoskeletal:         General: Normal range of motion.      Cervical back: Normal range of motion and neck supple.      Comments: Positive straight leg raising test; lumbar tenderness   Skin:     General: Skin is warm and dry.   Neurological:      General: No focal deficit present.      Mental Status: She is alert and oriented to person, place, and time.   Psychiatric:         Mood and Affect: Mood normal.         Behavior: Behavior normal.              Assessment & Plan:    1. Primary hypertension  stable  - Comprehensive Metabolic Panel; Future  - CBC with Auto Differential; Future  - CBC with  Auto Differential  - Comprehensive Metabolic Panel    2. Hypothyroidism, unspecified type  stable  - levothyroxine (SYNTHROID) 75 MCG tablet; Take 1 tablet by mouth every morning (before breakfast)  Dispense: 90 tablet; Refill: 1  - TSH; Future  - TSH    3. Hypertriglyceridemia  - Lipid Panel; Future  - Lipid Panel    4. Back muscle spasm  - cyclobenzaprine (FLEXERIL) 10 MG tablet; Take 1 tablet by mouth in the morning.  Dispense: 30 tablet; Refill: 2    5. Severe obesity (BMI 35.0-39.9) with comorbidity (HCC)  stable    6. Postmenopause  - DEXA BONE DENSITY AXIAL SKELETON; Future      Greater than 50% counseling and/or coordination of care: the treatment regimen is extensive; detailed review. Will notify of labs. Recheck in 3 months.This note was dictated using dragon voice recognition software.  It has been proofread, but there may still exist voice recognition errors that the author did not detect.      Signed By: Erskine Squibb, APRN - CNP     September 13, 2020

## 2020-09-14 LAB — CBC WITH AUTO DIFFERENTIAL
Absolute Eos #: 0.2 10*3/uL (ref 0.0–0.8)
Absolute Immature Granulocyte: 0 10*3/uL (ref 0.0–0.5)
Absolute Lymph #: 2.9 10*3/uL (ref 0.5–4.6)
Absolute Mono #: 0.5 10*3/uL (ref 0.1–1.3)
Basophils Absolute: 0 10*3/uL (ref 0.0–0.2)
Basophils: 1 % (ref 0.0–2.0)
Eosinophils %: 4 % (ref 0.5–7.8)
Hematocrit: 40.9 % (ref 35.8–46.3)
Hemoglobin: 13.7 g/dL (ref 11.7–15.4)
Immature Granulocytes: 0 % (ref 0.0–5.0)
Lymphocytes: 47 % — ABNORMAL HIGH (ref 13–44)
MCH: 31 PG (ref 26.1–32.9)
MCHC: 33.5 g/dL (ref 31.4–35.0)
MCV: 92.5 FL (ref 79.6–97.8)
MPV: 11.3 FL (ref 9.4–12.3)
Monocytes: 8 % (ref 4.0–12.0)
Platelets: 234 10*3/uL (ref 150–450)
RBC: 4.42 M/uL (ref 4.05–5.2)
RDW: 12.9 % (ref 11.9–14.6)
Seg Neutrophils: 40 % — ABNORMAL LOW (ref 43–78)
Segs Absolute: 2.5 10*3/uL (ref 1.7–8.2)
WBC: 6.2 10*3/uL (ref 4.3–11.1)
nRBC: 0 10*3/uL (ref 0.0–0.2)

## 2020-09-14 LAB — COMPREHENSIVE METABOLIC PANEL
ALT: 37 U/L (ref 12–65)
AST: 20 U/L (ref 15–37)
Albumin/Globulin Ratio: 1 — ABNORMAL LOW (ref 1.2–3.5)
Albumin: 3.6 g/dL (ref 3.2–4.6)
Alk Phosphatase: 87 U/L (ref 50–136)
Anion Gap: 8 mmol/L (ref 7–16)
BUN: 11 MG/DL (ref 8–23)
CO2: 22 mmol/L (ref 21–32)
Calcium: 9.4 MG/DL (ref 8.3–10.4)
Chloride: 111 mmol/L — ABNORMAL HIGH (ref 98–107)
Creatinine: 0.8 MG/DL (ref 0.6–1.0)
GFR African American: >60 mL/min/{1.73_m2} (ref >60)
GFR Non-African American: >60 mL/min/{1.73_m2} (ref >60)
Globulin: 3.5 g/dL (ref 2.3–3.5)
Glucose: 125 mg/dL — ABNORMAL HIGH (ref 65–100)
Potassium: 3.5 mmol/L (ref 3.5–5.1)
Sodium: 141 mmol/L (ref 136–145)
Total Bilirubin: 0.5 MG/DL (ref 0.2–1.1)
Total Protein: 7.1 g/dL (ref 6.3–8.2)

## 2020-09-14 LAB — LIPID PANEL
Chol/HDL Ratio: 4.4
Cholesterol, Total: 167 MG/DL (ref <200)
HDL: 38 MG/DL — ABNORMAL LOW (ref 40–60)
LDL Calculated: 73.4 MG/DL (ref <100)
Triglycerides: 278 MG/DL — ABNORMAL HIGH (ref 35–150)
VLDL Cholesterol Calculated: 55.6 MG/DL — ABNORMAL HIGH (ref 6.0–23.0)

## 2020-09-14 LAB — TSH: TSH, 3RD GENERATION: 1 u[IU]/mL (ref 0.358–3.740)

## 2020-09-15 ENCOUNTER — Encounter

## 2020-09-21 LAB — HEMOGLOBIN A1C
Hemoglobin A1C: 5.3 % (ref 4.8–5.6)
eAG: 105 mg/dL

## 2020-09-25 ENCOUNTER — Inpatient Hospital Stay: Admit: 2020-09-25 | Payer: PRIVATE HEALTH INSURANCE | Primary: Nurse Practitioner

## 2020-09-25 DIAGNOSIS — M810 Age-related osteoporosis without current pathological fracture: Secondary | ICD-10-CM

## 2020-09-26 ENCOUNTER — Ambulatory Visit: Payer: PRIVATE HEALTH INSURANCE | Primary: Nurse Practitioner

## 2020-10-31 ENCOUNTER — Encounter

## 2020-10-31 NOTE — Telephone Encounter (Signed)
Pt returned call regarding DEXA scan. Pt states she does not take any medications for osteoporosis but does take vit d and calcium supplements. Please advise.

## 2020-11-20 NOTE — Telephone Encounter (Signed)
I called pt.made next available that suited her schedule 10/26 she will call back as needed.

## 2020-11-20 NOTE — Telephone Encounter (Signed)
Patient has been having heart rate problems. Friday took an additional Metopolol to try to get Heart Rate down. Patient  said she feel 'odd'. Kind of 'Fuzzy and spaced' out. [phone].

## 2020-11-20 NOTE — Telephone Encounter (Signed)
Friday HR shot up to 130's and again yesterday.She is getting this "fuzziness"like her head does not feel right.BP is good 120/90's.She is on Toprol xl 50mg  qday and has been good until this weekend.She is wondering if she just needs a fu appt. Or what Dr.Bittrick would advise?

## 2020-11-20 NOTE — Telephone Encounter (Signed)
She can increase her Toprol-XL back up to 50 mg twice daily and schedule follow-up with me in the next 2 weeks

## 2020-12-20 ENCOUNTER — Ambulatory Visit: Admit: 2020-12-20 | Discharge: 2021-01-21 | Payer: PRIVATE HEALTH INSURANCE | Primary: Nurse Practitioner

## 2020-12-20 ENCOUNTER — Ambulatory Visit
Admit: 2020-12-20 | Discharge: 2020-12-20 | Payer: PRIVATE HEALTH INSURANCE | Attending: Cardiovascular Disease | Primary: Nurse Practitioner

## 2020-12-20 ENCOUNTER — Encounter

## 2020-12-20 DIAGNOSIS — R000 Tachycardia, unspecified: Secondary | ICD-10-CM

## 2020-12-20 NOTE — Patient Instructions (Signed)
4

## 2020-12-20 NOTE — Progress Notes (Signed)
[facility]  [address], [address]  [place], [place] [address]  PHONE: [phone]    SUBJECTIVE: Sandra Harmon  Sandra Harmon (18-Jul-1955) is a 65 y.o. female seen for a follow up visit regarding the following:   Specialty Problems          Cardiology Problems    Chest pain         Patient states that for the last month she has been having worsening episodes of intermittent heart racing and palpitations. She notes the episodes will happen every few days and she has not been able to pin point any aggravating factors that bring on the palpitations. She notes she does have some associated dizziness with the episodes of her heart racing. She says that she has occasionally had chest pain at the same time as her palpitations but not with every episode. She denies any shortness of breath. She is taking her metoprolol XL 50 mg twice a day.     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    Allergies   Allergen Reactions    Atorvastatin Other (See Comments)     Muscle pain    Hydrocodone-Acetaminophen Itching    Penicillins Hives     Past Medical History:   Diagnosis Date    Arthritis     Hypertension     Thyroid disease     hypothyroid     Past Surgical History:   Procedure Laterality Date    APPENDECTOMY      CERVICAL DISCECTOMY  09/16/06    Anterior C5-C6 diskectomy with decompression;  anterior cervical arthrodesis interbody technique;  anterior cervical instrumentation;  tricortical iliac crest bone graft harvested through a separate incision.     CHOLECYSTECTOMY      TAH AND BSO  (CERVIX REMOVED)       Family History   Problem Relation Age of Onset    Stroke Brother     Liver Disease Father     Asthma Mother     Lung Disease Mother     Diabetes Brother       Social History     Tobacco Use    Smoking status: Former     Packs/day: 1.00     Types: Cigarettes     Quit date: 10/25/1996     Years since quitting: 24.1    Smokeless tobacco: Former   Substance Use Topics    Alcohol use: No       Current Outpatient Medications:     levothyroxine (SYNTHROID) 75 MCG tablet, Take 1 tablet by mouth every morning (before breakfast), Disp: 90 tablet, Rfl: 1    cyclobenzaprine (FLEXERIL) 10 MG tablet, Take 1 tablet by mouth in the morning., Disp: 30 tablet, Rfl: 2    aspirin 81 MG chewable tablet, Take by mouth daily, Disp: , Rfl:     lisinopril (PRINIVIL;ZESTRIL) 20 MG tablet, Take 10 mg by mouth daily, Disp: , Rfl:     metoprolol succinate (TOPROL XL) 50 MG extended release tablet, Take 50 mg by  mouth 2 times daily, Disp: , Rfl:     ROS:  Review of Systems - General ROS: negative for - chills, fatigue or fever  Hematological and Lymphatic ROS: negative for - bleeding problems, bruising or jaundice  Respiratory ROS: no cough, shortness of breath, or wheezing  Cardiovascular ROS: no chest pain or dyspnea on exertion  positive for - palpitations  Gastrointestinal ROS: no abdominal pain, change in bowel habits, or black or bloody stools  Neurological ROS: no TIA or stroke symptoms  All other systems negative.    Wt Readings from Last 3 Encounters:   12/20/20 178 lb 9.6 oz (81 kg)   09/13/20 181 lb 3.2 oz (82.2 kg)   06/02/20 178 lb (80.7 kg)     Temp Readings from Last 3 Encounters:   09/13/20 98.1 ??F (36.7 ??C) (Oral)     BP Readings from Last 3 Encounters:   12/20/20 118/82   09/13/20 110/74   06/02/20 132/88     Pulse Readings from Last 3 Encounters:   12/20/20 80   09/13/20 84   06/02/20 78       PHYSICAL EXAM:  BP 118/82    Pulse 80    Ht 5' (1.524 m)    Wt 178 lb 9.6 oz (81 kg) Comment: with shoes   BMI  34.88 kg/m??   Physical Examination: General appearance - alert, well appearing, and in no distress  Mental status - alert, oriented to person, place, and time  Eyes - pupils equal and reactive, extraocular eye movements intact  Neck/lymph - supple, no significant adenopathy  Chest/lungs - clear to auscultation, no wheezes, rales or rhonchi, symmetric air entry  Heart/CV - normal rate, regular rhythm, normal S1, S2, no murmurs, rubs, clicks or gallops  Abdomen/GI - soft, nontender, nondistended, no masses or organomegaly  Musculoskeletal - no joint tenderness, deformity or swelling  Extremities - no pedal edema noted  Skin - normal coloration and turgor, no rashes, no suspicious skin lesions noted    EKG: normal EKG, normal sinus rhythm.         Medications reviewed and questions answered    No results found for this or any previous visit (from the past 672 hour(s)).  Lab Results   Component Value Date/Time    CHOL 167 09/13/2020 03:47 PM    HDL 38 09/13/2020 03:47 PM    VLDL 30 05/23/2020 04:33 PM       ASSESSMENT and PLAN  Problem List Items Addressed This Visit    None  Visit Diagnoses       Tachycardia    -  Primary    Relevant Orders    EKG 12 Lead (Completed)    Essential (primary) hypertension        Hypothyroidism due to acquired atrophy of thyroid        Tachycardia, unspecified               Palpitations:   - Patient states that episodes of heart racing has been worse over the last month.   - Plan for 21 day heart monitor and echo for further evaluation    Hypertension:   - BP today 118/82, well controlled  - Continue current regimen    Hypothyroidism:   - Last TSH was in 04/2020, at that time it was 2.670. Pt on Synthroid.   - Would benefit from repeat TSH with reflex    Continue meds as below    Current Outpatient Medications:  levothyroxine (SYNTHROID) 75 MCG tablet, Take 1 tablet by mouth every morning (before breakfast), Disp: 90 tablet, Rfl: 1    cyclobenzaprine (FLEXERIL) 10 MG tablet, Take 1  tablet by mouth in the morning., Disp: 30 tablet, Rfl: 2    aspirin 81 MG chewable tablet, Take by mouth daily, Disp: , Rfl:     lisinopril (PRINIVIL;ZESTRIL) 20 MG tablet, Take 10 mg by mouth daily, Disp: , Rfl:     metoprolol succinate (TOPROL XL) 50 MG extended release tablet, Take 50 mg by mouth 2 times daily, Disp: , Rfl:     Gerri Spore  12/20/2020  2:42 PM    Pt is instructed to follow all appropriate cardiac risk factor recommendations and to be compliant with meds and testing. Instructed to follow up appropriately and seek urgent medical care if acute or unstable issues arise. Results of some tests may be viewed thru MyChart but this does not substitute for follow up with MD. If follow up is not scheduled pt is instructed to call for follow up    I have personally seen and evaluated the patient and reviewed the students note and agree with the following assessment and plan and findings. I was present for and observed the key components of this note.  Any appropriate additions or editing of the information have been done by me.    Elisabeth Most MD, [facility]  Cardiology

## 2020-12-21 LAB — TSH WITH REFLEX: TSH w Free Thyroid if Abnormal: 1.8 u[IU]/mL (ref 0.358–3.740)

## 2020-12-22 ENCOUNTER — Encounter

## 2021-01-09 ENCOUNTER — Ambulatory Visit
Admit: 2021-01-09 | Discharge: 2021-01-09 | Payer: PRIVATE HEALTH INSURANCE | Attending: Nurse Practitioner | Primary: Nurse Practitioner

## 2021-01-09 DIAGNOSIS — I10 Essential (primary) hypertension: Secondary | ICD-10-CM

## 2021-01-09 LAB — AMB POC HEMOGLOBIN A1C: Hemoglobin A1C, POC: 5.1 %

## 2021-01-09 MED ORDER — LEVOTHYROXINE SODIUM 75 MCG PO TABS
75 MCG | ORAL_TABLET | Freq: Every day | ORAL | 1 refills | Status: DC
Start: 2021-01-09 — End: 2021-08-16

## 2021-01-09 MED ORDER — CYCLOBENZAPRINE HCL 10 MG PO TABS
10 MG | ORAL_TABLET | Freq: Every day | ORAL | 2 refills | Status: DC
Start: 2021-01-09 — End: 2021-04-09

## 2021-01-09 NOTE — Progress Notes (Signed)
[facility]  [address]  [place], [place] [address]   (ph) [phone] (fax) [phone]  Aleta Manternach L. Cothran-Pate APRN, FNP-C      Chief Complaint   Patient presents with    Follow-up     4 month fu    URI    Hypertension    Nasal Congestion       65 year old female  comes in as a recheck to chronic issues and to get med refills.  . She has had her mammogram,  colonoscopy, and Dexa scan done. She denies smoking. She is currently having testing with cardiology and has an echo scheduled for tomorrow. Reports that heart has been racing recently even while taking Toprol. BP has also been increased. Pt is under care of Dr. Delane Ginger for her lower back pain. She has had a cold recently; however, denies fever and does not feel it is flu/covid related. Denies nausea, headache, vomiting, or diarrhea.       Allergies   Allergen Reactions    Atorvastatin Other (See Comments)     Muscle pain    Hydrocodone-Acetaminophen Itching    Penicillins Hives       Past Medical History:   Diagnosis Date    Arthritis     Hypertension     Thyroid disease     hypothyroid       Family History   Problem Relation Age of Onset    Stroke Brother     Liver Disease Father     Asthma Mother     Lung Disease Mother     Diabetes Brother        Social History     Socioeconomic History    Marital status: Divorced     Spouse name: Not on file    Number of children: Not on file    Years of education: Not on file    Highest education level: Not on file   Occupational History    Not on file   Tobacco Use    Smoking status: Former     Packs/day: 1.00     Types: Cigarettes     Quit date: 10/25/1996     Years since quitting: 24.2    Smokeless tobacco: Former   Substance and Sexual Activity    Alcohol use: No    Drug use: No    Sexual activity: Not on file   Other Topics Concern    Not on file   Social History Narrative    Not on file     Social Determinants of Health     Financial Resource Strain: Low Risk     Difficulty of  Paying Living Expenses: Not very hard   Food Insecurity: No Food Insecurity    Worried About Programme researcher, broadcasting/film/video in the Last Year: Never true    Ran Out of Food in the Last Year: Never true   Transportation Needs: No Transportation Needs    Lack of Transportation (Medical): No    Lack of Transportation (Non-Medical): No   Physical Activity: Not on file   Stress: Not on file   Social Connections: Not on file   Intimate Partner Violence: Not on file   Housing Stability: Not on file       OB History       Gravida   4    Para        Term  Preterm        AB        Living             SAB        IAB        Ectopic        Molar        Multiple        Live Births                    Past Surgical History:   Procedure Laterality Date    APPENDECTOMY      CERVICAL DISCECTOMY  09/16/06    Anterior C5-C6 diskectomy with decompression;  anterior cervical arthrodesis interbody technique;  anterior cervical instrumentation;  tricortical iliac crest bone graft harvested through a separate incision.     CHOLECYSTECTOMY      TAH AND BSO (CERVIX REMOVED)         Health Maintenance   Topic Date Due    HIV screen  Never done    COVID-19 Vaccine (4 - Booster for [employer] series) 02/11/2020    Annual Wellness Visit (AWV)  08/18/2020    Flu vaccine (1) 09/25/2020    Depression Screen  09/13/2021    Breast cancer screen  08/20/2022    DTaP/Tdap/Td vaccine (2 - Td or Tdap) 09/26/2022    Diabetes screen  09/21/2023    Colorectal Cancer Screen  11/27/2023    Lipids  09/13/2025    Shingles vaccine  Completed    Hepatitis C screen  Completed    Hepatitis A vaccine  Aged Out    Hib vaccine  Aged Out    Meningococcal (ACWY) vaccine  Aged Out    Pneumococcal 0-64 years Vaccine  Aged Out         Current Outpatient Medications:     cyclobenzaprine (FLEXERIL) 10 MG tablet, Take 1 tablet by mouth daily, Disp: 30 tablet, Rfl: 2    levothyroxine (SYNTHROID) 75 MCG tablet, Take 1 tablet by mouth every morning (before breakfast), Disp: 90 tablet, Rfl: 1     aspirin 81 MG chewable tablet, Take by mouth daily, Disp: , Rfl:     lisinopril (PRINIVIL;ZESTRIL) 20 MG tablet, Take 10 mg by mouth daily, Disp: , Rfl:     metoprolol succinate (TOPROL XL) 50 MG extended release tablet, Take 50 mg by mouth 2 times daily, Disp: , Rfl:     Review of Systems   Constitutional:  Positive for fatigue.   HENT: Negative.     Eyes: Negative.    Respiratory:  Positive for cough (occasionally).    Cardiovascular:  Positive for palpitations.   Gastrointestinal: Negative.    Endocrine: Negative.    Genitourinary: Negative.    Musculoskeletal: Negative.    Skin: Negative.    Allergic/Immunologic: Negative.    Neurological: Negative.    Hematological: Negative.    Psychiatric/Behavioral: Negative.        Vitals:    01/09/21 1544   BP: (!) 149/88   Pulse: (!) 109   Temp: 98.5 ??F (36.9 ??C)   SpO2: 96%        Physical Exam  Constitutional:       Appearance: Normal appearance.   HENT:      Head: Normocephalic.      Nose: Nose normal.   Eyes:      Extraocular Movements: Extraocular movements intact.      Pupils: Pupils are equal, round, and reactive to light.  Cardiovascular:      Rate and Rhythm: Regular rhythm. Tachycardia present.   Pulmonary:      Effort: Pulmonary effort is normal.      Breath sounds: Normal breath sounds.   Abdominal:      General: Abdomen is flat.      Palpations: Abdomen is soft.   Musculoskeletal:         General: Normal range of motion.      Cervical back: Normal range of motion and neck supple.   Skin:     General: Skin is warm and dry.   Neurological:      General: No focal deficit present.      Mental Status: She is alert and oriented to person, place, and time.   Psychiatric:         Mood and Affect: Mood normal.         Behavior: Behavior normal.        PHQ-9=0      Assessment & Plan:    1. Primary hypertension  Pt undergoing cardiac testing and has echo tomorrow; cardiology is deciding what med will be best  - Comprehensive Metabolic Panel; Future  - CBC with Auto  Differential; Future    2. Back muscle spasm  stable  - cyclobenzaprine (FLEXERIL) 10 MG tablet; Take 1 tablet by mouth daily  Dispense: 30 tablet; Refill: 2    3. Hypothyroidism, unspecified type  stable  - levothyroxine (SYNTHROID) 75 MCG tablet; Take 1 tablet by mouth every morning (before breakfast)  Dispense: 90 tablet; Refill: 1  - TSH; Future    4. Pure hypertriglyceridemia  - Lipid Panel; Future    5. Osteoporosis, unspecified osteoporosis type, unspecified pathological fracture presence  Pt had to change from [place] [facility] referral to [facility] rheumatology due to insurance  - [facility] (Bone Density)    6. Elevated glucose  A1C=5.1  - AMB POC HEMOGLOBIN A1C      Greater than 50% counseling and/or coordination of care: the treatment regimen is extensive; detailed review. Will notify of labs once she does these. Lab was closed today. This note was dictated using dragon voice recognition software.  It has been proofread, but there may still exist voice recognition errors that the author did not detect.      Signed By: Erskine Squibb, APRN - CNP     January 09, 2021

## 2021-01-10 ENCOUNTER — Ambulatory Visit: Admit: 2021-01-10 | Discharge: 2021-01-15 | Payer: PRIVATE HEALTH INSURANCE | Primary: Nurse Practitioner

## 2021-01-10 DIAGNOSIS — R000 Tachycardia, unspecified: Secondary | ICD-10-CM

## 2021-01-11 LAB — TRANSTHORACIC ECHOCARDIOGRAM (TTE) COMPLETE (CONTRAST/BUBBLE/3D PRN)
AV Area by Peak Velocity: 3.1 cm2
AV Area by VTI: 3.2 cm2
AV Mean Gradient: 5 mmHg
AV Mean Velocity: 1.1 m/s
AV Peak Gradient: 12 mmHg
AV Peak Velocity: 1.7 m/s
AV VTI: 25.7 cm
AV Velocity Ratio: 1.06
AVA/BSA Peak Velocity: 1.7 cm2/m2
AVA/BSA VTI: 1.8 cm2/m2
Ao Root Index: 1.69 cm/m2
Aortic Root: 3 cm
Body Surface Area: 1.85 m2
E/E' Lateral: 5.89
E/E' Ratio (Averaged): 7.36
E/E' Septal: 8.83
EF BP: 80 % (ref 55–100)
Est. RA Pressure: 3 mmHg
Fractional Shortening 2D: 45 % (ref 28–44)
IVSd: 1 cm — AB (ref 0.6–0.9)
LA Diameter: 2.9 cm
LA Size Index: 1.63 cm/m2
LA Volume 2C: 27 mL (ref 22–52)
LA Volume 2C: 29 mL (ref 22–52)
LA Volume 4C: 19 mL — AB (ref 22–52)
LA Volume 4C: 20 mL — AB (ref 22–52)
LA Volume A/L: 25 mL
LA Volume BP: 24 mL (ref 22–52)
LA Volume Index A/L: 14 mL/m2 (ref 16–34)
LA Volume Index BP: 13 ml/m2 — AB (ref 16–34)
LA/AO Root Ratio: 0.97
LV E' Lateral Velocity: 9 cm/s
LV E' Septal Velocity: 6 cm/s
LV EDV A2C: 57 mL
LV EDV A4C: 66 mL
LV EDV BP: 62 mL (ref 56–104)
LV EDV Index A2C: 32 mL/m2
LV EDV Index A4C: 37 mL/m2
LV EDV Index BP: 35 mL/m2
LV ESV A2C: 18 mL
LV ESV A4C: 8 mL
LV ESV BP: 12 mL — AB (ref 19–49)
LV ESV Index A2C: 10 mL/m2
LV ESV Index A4C: 4 mL/m2
LV ESV Index BP: 7 mL/m2
LV Ejection Fraction A2C: 69 %
LV Ejection Fraction A4C: 87 %
LV Mass 2D Index: 65.9 g/m2 (ref 43–95)
LV Mass 2D: 117.3 g (ref 67–162)
LV RWT Ratio: 0.53
LVIDd Index: 2.13 cm/m2
LVIDd: 3.8 cm — AB (ref 3.9–5.3)
LVIDs Index: 1.18 cm/m2
LVIDs: 2.1 cm
LVOT Area: 3.1 cm2
LVOT Diameter: 2 cm
LVOT Mean Gradient: 5 mmHg
LVOT Peak Gradient: 12 mmHg
LVOT Peak Velocity: 1.8 m/s
LVOT SV: 82.6 ml
LVOT Stroke Volume Index: 46.4 mL/m2
LVOT VTI: 26.3 cm
LVOT:AV VTI Index: 1.02
LVPWd: 1 cm — AB (ref 0.6–0.9)
Left Ventricular Ejection Fraction: 73
MV A Velocity: 1.07 m/s
MV E Velocity: 0.53 m/s
MV E Wave Deceleration Time: 200.8 ms
MV E/A: 0.5
RV Basal Dimension: 2.2 cm
RV Mid Dimension: 1.8 cm
RVIDd: 2.4 cm
RVSP: 13 mmHg
TAPSE: 1.8 cm (ref 1.7–?)
TR Max Velocity: 1.58 m/s
TR Peak Gradient: 10 mmHg

## 2021-01-16 LAB — CARDIAC EVENT MONITOR: Body Surface Area: 1.85 m2

## 2021-01-24 ENCOUNTER — Ambulatory Visit
Admit: 2021-01-24 | Discharge: 2021-01-24 | Payer: PRIVATE HEALTH INSURANCE | Attending: Cardiovascular Disease | Primary: Nurse Practitioner

## 2021-01-24 DIAGNOSIS — R000 Tachycardia, unspecified: Secondary | ICD-10-CM

## 2021-01-24 NOTE — Progress Notes (Signed)
[facility]  [address], [address]  [place], [place] [address]  PHONE: [phone]    SUBJECTIVE: Sandra Harmon  Sandra Harmon (04-04-1955) is a 65 y.o. female seen for a follow up visit regarding the following:   Specialty Problems          Cardiology Problems    Chest pain         Patient states that for the last month she has been having worsening episodes of intermittent heart racing and palpitations. She notes the episodes will happen every few days and she has not been able to pin point any aggravating factors that bring on the palpitations. She notes she does have some associated dizziness with the episodes of her heart racing. She says that she has occasionally had chest pain at the same time as her palpitations but not with every episode. She denies any shortness of breath. She is taking her metoprolol XL 50 mg twice a day.     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    Last TSH was 2.670 with free T4 of 1.07 on 05/23/2020    01/24/21  Review of patient's monitor shows normal sinus rhythm with no evidence of any significant bradycardia no pauses and no arrhythmias    Echocardiogram shows normal left ventricular systolic function mild LVH normal diastolic function    Does notice that her heart rate sometimes elevates more than she feels would be normal or she can sometimes feel and its faster earlier today her heart rate was close to 130 her responses are all technically within the normal physiologic range of response but if she is having periods where she is noticing it more she can take an extra half of one of her Toprol-XL's.  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    Allergies   Allergen Reactions    Atorvastatin Other (See Comments)     Muscle pain    Hydrocodone-Acetaminophen Itching    Penicillins Hives     Past Medical History:   Diagnosis Date    Arthritis     Hypertension     Thyroid disease     hypothyroid     Past Surgical History:   Procedure Laterality Date    APPENDECTOMY      CERVICAL DISCECTOMY  09/16/06    Anterior C5-C6 diskectomy with decompression;  anterior cervical arthrodesis interbody technique;  anterior cervical instrumentation;  tricortical iliac crest bone graft harvested through a separate incision.     CHOLECYSTECTOMY      TAH AND BSO (CERVIX REMOVED)       Family History   Problem Relation Age of Onset    Stroke Brother     Liver Disease Father     Asthma Mother     Lung Disease Mother     Diabetes Brother       Social History     Tobacco Use    Smoking status: Former     Packs/day: 1.00  Types: Cigarettes     Quit date: 10/25/1996     Years since quitting: 24.2    Smokeless tobacco: Former   Substance Use Topics    Alcohol use: No       Current Outpatient Medications:     Cholecalciferol (VITAMIN D3) 1.25 MG (50000 UT) CAPS, Take by mouth, Disp: , Rfl:     cyclobenzaprine (FLEXERIL) 10 MG tablet, Take 1 tablet by mouth daily, Disp: 30 tablet, Rfl: 2    levothyroxine (SYNTHROID) 75 MCG tablet, Take 1 tablet by mouth every morning (before breakfast), Disp: 90 tablet, Rfl: 1    aspirin 81 MG chewable tablet, Take by mouth daily, Disp: , Rfl:     lisinopril (PRINIVIL;ZESTRIL) 20 MG tablet, Take 10 mg by mouth daily TAKING 1/2 TABLET, Disp: , Rfl:     metoprolol succinate (TOPROL XL) 50 MG extended release tablet, Take 50 mg by mouth 2 times daily, Disp: , Rfl:     ROS:  Review of Systems - General ROS: negative for - chills, fatigue or fever  Hematological and Lymphatic ROS:  negative for - bleeding problems, bruising or jaundice  Respiratory ROS: no cough, shortness of breath, or wheezing  Cardiovascular ROS: no chest pain or dyspnea on exertion  positive for - palpitations  Gastrointestinal ROS: no abdominal pain, change in bowel habits, or black or bloody stools  Neurological ROS: no TIA or stroke symptoms  All other systems negative.    Wt Readings from Last 3 Encounters:   01/24/21 180 lb (81.6 kg)   01/10/21 178 lb (80.7 kg)   01/09/21 178 lb (80.7 kg)     Temp Readings from Last 3 Encounters:   01/09/21 98.5 ??F (36.9 ??C) (Oral)   09/13/20 98.1 ??F (36.7 ??C) (Oral)     BP Readings from Last 3 Encounters:   01/24/21 124/80   01/10/21 (!) 144/82   01/09/21 (!) 149/88     Pulse Readings from Last 3 Encounters:   01/24/21 (!) 104   01/09/21 (!) 109   12/20/20 80       PHYSICAL EXAM:  BP 124/80    Pulse (!) 104    Ht 5' (1.524 m)    Wt 180 lb (81.6 kg)    BMI 35.15 kg/m??   Physical Examination: General appearance - alert, well appearing, and in no distress  Mental status - alert, oriented to person, place, and time  Eyes - pupils equal and reactive, extraocular eye movements intact  Neck/lymph - supple, no significant adenopathy  Chest/lungs - clear to auscultation, no wheezes, rales or rhonchi, symmetric air entry  Heart/CV - normal rate, regular rhythm, normal S1, S2, no murmurs, rubs, clicks or gallops  Abdomen/GI - soft, nontender, nondistended, no masses or organomegaly  Musculoskeletal - no joint tenderness, deformity or swelling  Extremities - no pedal edema noted  Skin - normal coloration and turgor, no rashes, no suspicious skin lesions noted    EKG: normal EKG, normal sinus rhythm.         Medications reviewed and questions answered    Recent Results (from the past 672 hour(s))   AMB POC HEMOGLOBIN A1C    Collection Time: 01/09/21  4:39 PM   Result Value Ref Range    Hemoglobin A1C, POC 5.1 %   Transthoracic echocardiogram (TTE) complete with contrast, bubble, strain, and 3D  PRN    Collection Time: 01/10/21  7:59 AM   Result Value Ref Range    IVSd 1.0 (A)  0.6 - 0.9 cm    LVIDd 3.8 (A) 3.9 - 5.3 cm    LVIDs 2.1 cm    LVOT Diameter 2.0 cm    LVPWd 1.0 (A) 0.6 - 0.9 cm    EF BP 80 55 - 100 %    LV Ejection Fraction A2C 69 %    LV Ejection Fraction A4C 87 %    LV EDV A2C 57 mL    LV EDV A4C 66 mL    LV EDV BP 62 56 - 104 mL    LV ESV A2C 18 mL    LV ESV A4C 8 mL    LV ESV BP 12 (A) 19 - 49 mL    LVOT Peak Gradient 12 mmHg    LVOT Mean Gradient 5 mmHg    LVOT SV 82.6 ml    LVOT Peak Velocity 1.8 m/s    LVOT VTI 26.3 cm    RVIDd 2.4 cm    LA Diameter 2.9 cm    LA Volume A/L 25 mL    LA Volume 2C 29 22 - 52 mL    LA Volume 4C 20 (A) 22 - 52 mL    LA Volume 2C 27 22 - 52 mL    LA Volume 4C 19 (A) 22 - 52 mL    LA Volume BP 24 22 - 52 mL    AV Area by Peak Velocity 3.1 cm2    AV Area by VTI 3.2 cm2    AV Peak Gradient 12 mmHg    AV Mean Gradient 5 mmHg    AV Peak Velocity 1.7 m/s    AV Mean Velocity 1.1 m/s    AV VTI 25.7 cm    MV A Velocity 1.07 m/s    MV E Wave Deceleration Time 200.8 ms    MV E Velocity 0.53 m/s    LV E' Lateral Velocity 9 cm/s    LV E' Septal Velocity 6 cm/s    TAPSE 1.8 1.7 cm    TR Peak Gradient 10 mmHg    TR Max Velocity 1.58 m/s    Aortic Root 3.0 cm    Body Surface Area 1.85 m2    Fractional Shortening 2D 45 28 - 44 %    LV ESV Index BP 7 mL/m2    LV EDV Index BP 35 mL/m2    LV ESV Index A4C 4 mL/m2    LV EDV Index A4C 37 mL/m2    LV ESV Index A2C 10 mL/m2    LV EDV Index A2C 32 mL/m2    LVIDd Index 2.13 cm/m2    LVIDs Index 1.18 cm/m2    LV RWT Ratio 0.53     LV Mass 2D 117.3 67 - 162 g    LV Mass 2D Index 65.9 43 - 95 g/m2    MV E/A 0.50     E/E' Ratio (Averaged) 7.36     E/E' Lateral 5.89     E/E' Septal 8.83     LA Volume Index BP 13 (A) 16 - 34 ml/m2    LA Volume Index A/L 14 16 - 34 mL/m2    LVOT Stroke Volume Index 46.4 mL/m2    LVOT Area 3.1 cm2    LA Size Index 1.63 cm/m2    LA/AO Root Ratio 0.97     Ao Root Index 1.69 cm/m2    AV Velocity Ratio 1.06      LVOT:AV VTI Index 1.02  AVA/BSA VTI 1.8 cm2/m2    AVA/BSA Peak Velocity 1.7 cm2/m2    RV Basal Dimension 2.2 cm    RV Mid Dimension 1.8 cm    Est. RA Pressure 3 mmHg    RVSP 13 mmHg    Left Ventricular Ejection Fraction 73     LVEF MODALITY ECHO      Lab Results   Component Value Date/Time    CHOL 167 09/13/2020 03:47 PM    HDL 38 09/13/2020 03:47 PM    VLDL 30 05/23/2020 04:33 PM       ASSESSMENT and PLAN  Problem List Items Addressed This Visit    None  Visit Diagnoses       Tachycardia    -  Primary    Essential (primary) hypertension        Hypothyroidism due to acquired atrophy of thyroid               Palpitations:   - Patient states that episodes of heart racing has been worse over the last month.   - Plan for 21 day heart monitor and echo for further evaluation    Hypertension:   - BP today 118/82, well controlled  - Continue current regimen    Hypothyroidism:   - Last TSH was in 04/2020, at that time it was 2.670. Pt on Synthroid.   - Would benefit from repeat TSH with reflex    Continue meds as below    Current Outpatient Medications:     Cholecalciferol (VITAMIN D3) 1.25 MG (50000 UT) CAPS, Take by mouth, Disp: , Rfl:     cyclobenzaprine (FLEXERIL) 10 MG tablet, Take 1 tablet by mouth daily, Disp: 30 tablet, Rfl: 2    levothyroxine (SYNTHROID) 75 MCG tablet, Take 1 tablet by mouth every morning (before breakfast), Disp: 90 tablet, Rfl: 1    aspirin 81 MG chewable tablet, Take by mouth daily, Disp: , Rfl:     lisinopril (PRINIVIL;ZESTRIL) 20 MG tablet, Take 10 mg by mouth daily TAKING 1/2 TABLET, Disp: , Rfl:     metoprolol succinate (TOPROL XL) 50 MG extended release tablet, Take 50 mg by mouth 2 times daily, Disp: , Rfl:     Hilliard Clark, MD  01/24/2021  3:58 PM    Pt is instructed to follow all appropriate cardiac risk factor recommendations and to be compliant with meds and testing. Instructed to follow up appropriately and seek urgent medical care if acute or unstable issues arise. Results of some  tests may be viewed thru MyChart but this does not substitute for follow up with MD. If follow up is not scheduled pt is instructed to call for follow up    I have personally seen and evaluated the patient and reviewed the students note and agree with the following assessment and plan and findings. I was present for and observed the key components of this note.  Any appropriate additions or editing of the information have been done by me.    Elisabeth Most MD, [facility]  Cardiology

## 2021-02-05 NOTE — Telephone Encounter (Signed)
LVM for [facility] Rheumatology office to call back regarding status of referral per provider Sonya-NP.

## 2021-02-12 NOTE — Telephone Encounter (Signed)
Sandra Harmon, per rheumatology bone density scans are no longer performed in there practice.

## 2021-03-07 ENCOUNTER — Encounter: Payer: BLUE CROSS/BLUE SHIELD | Attending: Nurse Practitioner | Primary: Nurse Practitioner

## 2021-03-21 NOTE — Telephone Encounter (Signed)
Location of patient: [place]    Received call from [place] at [facility] with [employer] Complaint.    Subjective: Caller states "For the last few weeks I have had some headaches come and go. Also, I am having some new anxiety that I haven't really had before. Sometimes when I feel anxious, like today, my chest kind of hurts. I Jus thad an ECHO and heart monitor so I know its nothing to do with my heart. I have been working 11 hours a day for 7 months now. "     Current Symptoms: anxiety, headaches, chest pain (not at time of call)     Onset: a few days ago;     Associated Symptoms:     Pain Severity: none/10; ; at time of call     Temperature: none     What has been tried: nothing      Recommended disposition: See PCP within 3 Days    Care advice provided, patient verbalizes understanding; denies any other questions or concerns; instructed to call back for any new or worsening symptoms.    Patient/Caller agrees with recommended disposition; Clinical research associate provided warm transfer to [employer] at [employer] for appointment scheduling    Attention Provider:  Thank you for allowing me to participate in the care of your patient.  The patient was connected to triage in response to information provided to the ECC/PSC.  Please do not respond through this encounter as the response is not directed to a shared pool.      Reason for Disposition   [1] Symptoms of anxiety or panic AND [2] has not been evaluated for this by physician    Protocols used: Anxiety and Panic Attack-ADULT-AH

## 2021-03-23 ENCOUNTER — Ambulatory Visit
Admit: 2021-03-23 | Discharge: 2021-03-23 | Payer: BLUE CROSS/BLUE SHIELD | Attending: Family | Primary: Nurse Practitioner

## 2021-03-23 DIAGNOSIS — F419 Anxiety disorder, unspecified: Secondary | ICD-10-CM

## 2021-03-23 MED ORDER — SERTRALINE HCL 50 MG PO TABS
50 MG | ORAL_TABLET | Freq: Every day | ORAL | 5 refills | Status: DC
Start: 2021-03-23 — End: 2021-08-16

## 2021-03-23 NOTE — Progress Notes (Signed)
Sandra Harmon is a 66 y.o. female who presents today for the following:  Chief Complaint   Patient presents with    Chest Pain     Reports CP x2 weeks along w/ elevated HR         Allergies   Allergen Reactions    Atorvastatin Other (See Comments)     Muscle pain    Hydrocodone-Acetaminophen Itching    Penicillins Hives       Current Outpatient Medications   Medication Sig Dispense Refill    sertraline (ZOLOFT) 50 MG tablet Take 1 tablet by mouth daily 30 tablet 5    Cholecalciferol (VITAMIN D3) 1.25 MG (50000 UT) CAPS Take by mouth      cyclobenzaprine (FLEXERIL) 10 MG tablet Take 1 tablet by mouth daily 30 tablet 2    levothyroxine (SYNTHROID) 75 MCG tablet Take 1 tablet by mouth every morning (before breakfast) 90 tablet 1    aspirin 81 MG chewable tablet Take by mouth daily      lisinopril (PRINIVIL;ZESTRIL) 20 MG tablet Take 10 mg by mouth daily TAKING 1/2 TABLET      metoprolol succinate (TOPROL XL) 50 MG extended release tablet Take 50 mg by mouth 2 times daily       No current facility-administered medications for this visit.       Past Medical History:   Diagnosis Date    Arthritis     Chronic back pain     Hypertension     Hyperthyroidism     Thyroid disease     hypothyroid       Past Surgical History:   Procedure Laterality Date    APPENDECTOMY      CERVICAL DISCECTOMY  09/16/06    Anterior C5-C6 diskectomy with decompression;  anterior cervical arthrodesis interbody technique;  anterior cervical instrumentation;  tricortical iliac crest bone graft harvested through a separate incision.     CHOLECYSTECTOMY      TAH AND BSO (CERVIX REMOVED)      TUBAL LIGATION         Social History     Tobacco Use    Smoking status: Former     Packs/day: 1.00     Years: 15.00     Pack years: 15.00     Types: Cigarettes     Start date: 421981     Quit date: 10/25/1996     Years since quitting: 24.4     Passive exposure: Never    Smokeless tobacco: Former   Substance Use Topics    Alcohol use: No        Family History    Problem Relation Age of Onset    Stroke Brother     Diabetes Brother     Alcohol Abuse Brother     Early Death Brother     Heart Attack Brother     High Blood Pressure Brother     Substance Abuse Brother     Liver Disease Father     Alcohol Abuse Father     Asthma Mother     Lung Disease Mother     High Blood Pressure Mother     Alcohol Abuse Brother     Diabetes Brother     High Blood Pressure Brother     Substance Abuse Brother     Early Death Sister        HPI       Pt presents new to me today, established with Sandra Cothran-Pate,  NP for acute visit:    Received call from Sandra Harmon at [facility] with [employer] Complaint.     Subjective: Caller states "For the last few weeks I have had some headaches come and go. Also, I am having some new anxiety that I haven't really had before. Sometimes when I feel anxious, like today, my chest kind of hurts. I Jus thad an ECHO and heart monitor so I know its nothing to do with my heart. I have been working 11 hours a day for 7 months now. 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  She is established with cardiology and referred to rheumatology for her osteoporosis.  She had elevated bp in November and recommended to f/u with Sandra in 3 weeks; did not see that done.    It appears she had labs ordered by Sandra Laundry, NP that were not completed 01/09/21.  Will have drawn today.    Olle melatonin helps with sleep.  Metoprolol BID-TID per cardiology.  HR 126 on her watch, continues to monitor bp and HR.  3 weeks onset d/t difficult work conditions working 11 hours daily 5 days a week.  No  time off has been approved.  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  Extreme stress d/t supervisor and understaffed.  Reports anxious, chest pain, and palpitations mainly when at work.  Cardiac eval has been negative.    Review of Systems   Constitutional:  Positive for fatigue.   Respiratory:  Negative for shortness of breath.    Cardiovascular:  Positive for chest pain and palpitations.   Psychiatric/Behavioral:  Negative for dysphoric mood and sleep disturbance. The patient is nervous/anxious.        BP (!) 146/98    Pulse (!) 116    Temp 98.1 ??F (36.7 ??C) (Oral)    Ht 5' (1.524 m)    Wt 179 lb 12.8 oz (81.6 kg)    SpO2 99%    BMI 35.11 kg/m??     Physical Exam  Constitutional:       General: She is not in acute distress.     Appearance: Normal appearance. She is obese. She is not ill-appearing.   HENT:      Head: Normocephalic and atraumatic.      Right Ear: External ear normal.      Left Ear: External ear normal.   Eyes:      Extraocular Movements: Extraocular movements intact.      Conjunctiva/sclera: Conjunctivae normal.      Pupils: Pupils are equal, round, and reactive to light.   Cardiovascular:      Rate and Rhythm: Regular rhythm. Tachycardia present.      Pulses: Normal pulses.      Heart sounds: Normal heart sounds.   Pulmonary:      Effort: Pulmonary effort is normal.      Breath sounds: Normal breath sounds.   Abdominal:      General: Bowel sounds are normal. There is no  distension.      Palpations: Abdomen is soft.      Tenderness: There is no abdominal tenderness.   Musculoskeletal:         General: Normal range of motion.      Cervical back: Normal range of motion and neck supple.      Right lower leg: No edema.      Left lower leg: No edema.   Skin:     General: Skin is warm and dry.      Coloration: Skin is not jaundiced or pale.   Neurological:      General: No focal deficit present.      Mental Status: She is alert and oriented to person, place, and time.   Psychiatric:         Behavior: Behavior normal.          Thought Content: Thought content normal.         Judgment: Judgment normal.      Comments: Tearful and anxious        1. Anxiety  -     sertraline (ZOLOFT) 50 MG tablet; Take 1 tablet by mouth daily, Disp-30 tablet, R-5Normal  2. Palpitations  3. Other chest pain   Pt reports has support of her church and daughter in law and son (lives with them).  Declines counseling at this time or psychiatry referral.  Patient informed, we will call with blood work results within one week.  If you have not heard regarding results in over a week, please contact office.  You can also review results on mychart.       Follow-up and Dispositions    Return for work excuse 01/30-02/03 return planned 04/02/21  keep appt with [facility].         Noel Christmas, APRN - CNP

## 2021-03-25 DIAGNOSIS — F419 Anxiety disorder, unspecified: Secondary | ICD-10-CM

## 2021-03-26 ENCOUNTER — Encounter: Admit: 2021-03-26 | Discharge: 2021-03-26 | Payer: BLUE CROSS/BLUE SHIELD | Primary: Nurse Practitioner

## 2021-03-26 DIAGNOSIS — I10 Essential (primary) hypertension: Secondary | ICD-10-CM

## 2021-03-26 NOTE — Other (Signed)
Results to patient please.  Total cholesterol 203, triglycerides 342, HDL is 45, LDL is 89.6.  Has she been on a low-carb diet?  If not, please encourage this if so, please let me know.  TSH is within normal limits.  Liver enzymes are normal.  Hemoglobin is 14.9.  Keep scheduled follow-up  Thanks

## 2021-03-27 LAB — COMPREHENSIVE METABOLIC PANEL
ALT: 33 U/L (ref 12–65)
AST: 24 U/L (ref 15–37)
Albumin/Globulin Ratio: 1.2 (ref 0.4–1.6)
Albumin: 3.7 g/dL (ref 3.2–4.6)
Alk Phosphatase: 83 U/L (ref 50–136)
Anion Gap: 7 mmol/L (ref 2–11)
BUN: 8 MG/DL (ref 8–23)
CO2: 22 mmol/L (ref 21–32)
Calcium: 9.1 MG/DL (ref 8.3–10.4)
Chloride: 114 mmol/L — ABNORMAL HIGH (ref 101–110)
Creatinine: 0.6 MG/DL (ref 0.6–1.0)
Est, Glom Filt Rate: >60 mL/min/{1.73_m2} (ref >60)
Globulin: 3 g/dL (ref 2.8–4.5)
Glucose: 74 mg/dL (ref 65–100)
Potassium: 3.8 mmol/L (ref 3.5–5.1)
Sodium: 143 mmol/L (ref 133–143)
Total Bilirubin: 0.7 MG/DL (ref 0.2–1.1)
Total Protein: 6.7 g/dL (ref 6.3–8.2)

## 2021-03-27 LAB — CBC WITH AUTO DIFFERENTIAL
Absolute Eos #: 0.2 10*3/uL (ref 0.0–0.8)
Absolute Immature Granulocyte: 0 10*3/uL (ref 0.0–0.5)
Absolute Lymph #: 3.5 10*3/uL (ref 0.5–4.6)
Absolute Mono #: 0.6 10*3/uL (ref 0.1–1.3)
Basophils Absolute: 0.1 10*3/uL (ref 0.0–0.2)
Basophils: 1 % (ref 0.0–2.0)
Eosinophils %: 3 % (ref 0.5–7.8)
Hematocrit: 44.1 % (ref 35.8–46.3)
Hemoglobin: 14.9 g/dL (ref 11.7–15.4)
Immature Granulocytes: 0 % (ref 0.0–5.0)
Lymphocytes: 50 % — ABNORMAL HIGH (ref 13–44)
MCH: 31.1 PG (ref 26.1–32.9)
MCHC: 33.8 g/dL (ref 31.4–35.0)
MCV: 92.1 FL (ref 82–102)
MPV: 10.6 FL (ref 9.4–12.3)
Monocytes: 8 % (ref 4.0–12.0)
Platelets: 282 10*3/uL (ref 150–450)
RBC: 4.79 M/uL (ref 4.05–5.2)
RDW: 13.1 % (ref 11.9–14.6)
Seg Neutrophils: 38 % — ABNORMAL LOW (ref 43–78)
Segs Absolute: 2.7 10*3/uL (ref 1.7–8.2)
WBC: 7.1 10*3/uL (ref 4.3–11.1)
nRBC: 0 10*3/uL (ref 0.0–0.2)

## 2021-03-27 LAB — HEMOGLOBIN A1C
Hemoglobin A1C: 5.2 % (ref 4.8–5.6)
eAG: 103 mg/dL

## 2021-03-27 LAB — LIPID PANEL
Chol/HDL Ratio: 4.5
Cholesterol, Total: 203 MG/DL — ABNORMAL HIGH (ref <200)
HDL: 45 MG/DL (ref 40–60)
LDL Calculated: 89.6 MG/DL (ref <100)
Triglycerides: 342 MG/DL — ABNORMAL HIGH (ref 35–150)
VLDL Cholesterol Calculated: 68.4 MG/DL — ABNORMAL HIGH (ref 6.0–23.0)

## 2021-03-27 LAB — TSH: TSH, 3RD GENERATION: 2.34 u[IU]/mL (ref 0.358–3.740)

## 2021-04-04 ENCOUNTER — Ambulatory Visit
Admit: 2021-04-04 | Discharge: 2021-04-04 | Payer: PRIVATE HEALTH INSURANCE | Attending: Nurse Practitioner | Primary: Nurse Practitioner

## 2021-04-04 DIAGNOSIS — F419 Anxiety disorder, unspecified: Secondary | ICD-10-CM

## 2021-04-04 NOTE — Progress Notes (Signed)
[facility]  [address]  [place], [place] [address]   (ph) [phone] (fax) [phone]  Mattix Imhof L. Cothran-Pate APRN, FNP-C      Chief Complaint   Patient presents with    Forms       66 yo female comes in as a follow-up to anxiety. Pt came in an saw Kendal Hymen on 03/23/21 due to job stress/anxiety. She was placed on Zoloft and feels it has helped some and she was also placed out of work for a week. She started a new job today and is in a much better place. She needs her FMLA papers filled out in order to get her PTO for the week. She has also started a low carb diet since changing jobs and will repeat cholesterol at next appt.       Allergies   Allergen Reactions    Atorvastatin Other (See Comments)     Muscle pain    Hydrocodone-Acetaminophen Itching    Penicillins Hives       Past Medical History:   Diagnosis Date    Arthritis     Chronic back pain     Hypertension     Hyperthyroidism     Thyroid disease     hypothyroid       Family History   Problem Relation Age of Onset    Stroke Brother     Diabetes Brother     Alcohol Abuse Brother     Early Death Brother     Heart Attack Brother     High Blood Pressure Brother     Substance Abuse Brother     Liver Disease Father     Alcohol Abuse Father     Asthma Mother     Lung Disease Mother     High Blood Pressure Mother     Alcohol Abuse Brother     Diabetes Brother     High Blood Pressure Brother     Substance Abuse Brother     Early Death Sister        Social History     Socioeconomic History    Marital status: Divorced     Spouse name: Not on file    Number of children: Not on file    Years of education: Not on file    Highest education level: Not on file   Occupational History    Not on file   Tobacco Use    Smoking status: Former     Packs/day: 1.00     Years: 15.00     Pack years: 15.00     Types: Cigarettes     Start date: 44     Quit date: 10/25/1996     Years since quitting: 24.4     Passive exposure: Never    Smokeless  tobacco: Former   Substance and Sexual Activity    Alcohol use: No    Drug use: No    Sexual activity: Not Currently     Partners: Male   Other Topics Concern    Not on file   Social History Narrative    Not on file     Social Determinants of Health     Financial Resource Strain: Low Risk     Difficulty of Paying Living Expenses: Not very hard   Food Insecurity: No Food Insecurity    Worried About Running Out of Food in the Last Year: Never true    Ran Out of  Food in the Last Year: Never true   Transportation Needs: No Transportation Needs    Lack of Transportation (Medical): No    Lack of Transportation (Non-Medical): No   Physical Activity: Not on file   Stress: Not on file   Social Connections: Not on file   Intimate Partner Violence: Not on file   Housing Stability: Not on file       OB History       Gravida   4    Para        Term        Preterm        AB        Living             SAB        IAB        Ectopic        Molar        Multiple        Live Births                    Past Surgical History:   Procedure Laterality Date    APPENDECTOMY      CERVICAL DISCECTOMY  09/16/06    Anterior C5-C6 diskectomy with decompression;  anterior cervical arthrodesis interbody technique;  anterior cervical instrumentation;  tricortical iliac crest bone graft harvested through a separate incision.     CHOLECYSTECTOMY      TAH AND BSO (CERVIX REMOVED)      TUBAL LIGATION         Health Maintenance   Topic Date Due    HIV screen  Never done    COVID-19 Vaccine (4 - Booster for [employer] series) 02/11/2020    Annual Wellness Visit (AWV)  08/18/2020    Flu vaccine (1) 09/25/2020    Depression Screen  03/23/2022    Breast cancer screen  08/20/2022    DTaP/Tdap/Td vaccine (2 - Td or Tdap) 09/26/2022    Colorectal Cancer Screen  11/27/2023    Lipids  03/26/2026    Shingles vaccine  Completed    Hepatitis C screen  Completed    Hepatitis A vaccine  Aged Out    Hib vaccine  Aged Out    Meningococcal (ACWY) vaccine  Aged Out     Pneumococcal 0-64 years Vaccine  Aged Out         Current Outpatient Medications:     sertraline (ZOLOFT) 50 MG tablet, Take 1 tablet by mouth daily, Disp: 30 tablet, Rfl: 5    cyclobenzaprine (FLEXERIL) 10 MG tablet, Take 1 tablet by mouth daily, Disp: 30 tablet, Rfl: 2    levothyroxine (SYNTHROID) 75 MCG tablet, Take 1 tablet by mouth every morning (before breakfast), Disp: 90 tablet, Rfl: 1    aspirin 81 MG chewable tablet, Take by mouth daily, Disp: , Rfl:     lisinopril (PRINIVIL;ZESTRIL) 20 MG tablet, Take 10 mg by mouth daily TAKING 1/2 TABLET, Disp: , Rfl:     metoprolol succinate (TOPROL XL) 50 MG extended release tablet, Take 50 mg by mouth 2 times daily, Disp: , Rfl:     Review of Systems   Constitutional: Negative.    HENT: Negative.     Eyes: Negative.    Respiratory: Negative.     Cardiovascular: Negative.    Gastrointestinal: Negative.    Endocrine: Negative.    Genitourinary: Negative.    Musculoskeletal: Negative.    Skin: Negative.  Allergic/Immunologic: Negative.    Neurological: Negative.    Hematological: Negative.    Psychiatric/Behavioral:  The patient is nervous/anxious.       Vitals:    04/04/21 1602   BP: 120/82   Pulse: 85   SpO2: 97%        Physical Exam  Constitutional:       Appearance: Normal appearance.   HENT:      Head: Normocephalic.      Nose: Nose normal.   Eyes:      Extraocular Movements: Extraocular movements intact.      Pupils: Pupils are equal, round, and reactive to light.   Cardiovascular:      Rate and Rhythm: Normal rate and regular rhythm.   Pulmonary:      Effort: Pulmonary effort is normal.      Breath sounds: Normal breath sounds.   Abdominal:      General: Abdomen is flat.      Palpations: Abdomen is soft.   Musculoskeletal:         General: Normal range of motion.      Cervical back: Normal range of motion and neck supple.   Skin:     General: Skin is warm and dry.   Neurological:      General: No focal deficit present.      Mental Status: She is alert and  oriented to person, place, and time.   Psychiatric:         Mood and Affect: Mood normal.         Behavior: Behavior normal.        PHQ-2=0      Assessment & Plan:    1. Anxiety  Continue Zoloft    2. Primary hypertension  Stable; continue meds    3. Severe obesity (BMI 35.0-39.9) with comorbidity (HCC)  Diet encouraged    4. Hypertriglyceridemia  Low carb diet encouraged    5. Hypothyroidism, unspecified type  Continue med      Forms filled out for the week that she was out of work.  Greater than 50% counseling and/or coordination of care: Will repeat labs in 2-3 months.  This note was dictated using dragon voice recognition software.  It has been proofread, but there may still exist voice recognition errors that the author did not detect.      Signed By: Erskine Squibb, APRN - CNP     April 04, 2021

## 2021-04-07 ENCOUNTER — Encounter

## 2021-04-09 ENCOUNTER — Encounter

## 2021-04-09 NOTE — Telephone Encounter (Signed)
Please refill medication for pt. She is currently due next apt 5/16

## 2021-04-10 MED ORDER — CYCLOBENZAPRINE HCL 10 MG PO TABS
10 MG | ORAL_TABLET | Freq: Every day | ORAL | 2 refills | Status: AC
Start: 2021-04-10 — End: 2021-06-28

## 2021-04-10 NOTE — Telephone Encounter (Signed)
Medication refilled by provider 04/09/21, receipt confirmed by pharmacy on 04/09/21 at 8:50pm.

## 2021-05-15 ENCOUNTER — Inpatient Hospital Stay: Admit: 2021-05-15 | Payer: BLUE CROSS/BLUE SHIELD | Primary: Nurse Practitioner

## 2021-05-15 ENCOUNTER — Encounter

## 2021-05-15 DIAGNOSIS — M5489 Other dorsalgia: Secondary | ICD-10-CM

## 2021-05-30 MED ORDER — METOPROLOL SUCCINATE ER 50 MG PO TB24
50 MG | ORAL_TABLET | ORAL | 0 refills | Status: AC
Start: 2021-05-30 — End: 2021-08-16

## 2021-05-30 NOTE — Telephone Encounter (Signed)
Requested Prescriptions     Pending Prescriptions Disp Refills    metoprolol succinate (TOPROL XL) 50 MG extended release tablet [Pharmacy Med Name: METOPROLOL SUCC ER 50 MG TAB] 180 tablet 0     Sig: TAKE ONE TABLET BY MOUTH TWICE A DAY

## 2021-06-26 ENCOUNTER — Encounter

## 2021-06-28 ENCOUNTER — Encounter

## 2021-06-28 MED ORDER — LISINOPRIL 20 MG PO TABS
20 MG | ORAL_TABLET | Freq: Every day | ORAL | 0 refills | Status: AC
Start: 2021-06-28 — End: 2021-12-25

## 2021-06-28 MED ORDER — CYCLOBENZAPRINE HCL 10 MG PO TABS
10 MG | ORAL_TABLET | Freq: Every day | ORAL | 2 refills | Status: DC
Start: 2021-06-28 — End: 2021-08-16

## 2021-06-28 NOTE — Telephone Encounter (Signed)
Pt's next apt is 6/22 and she will be out of medication before then. Meds have been pended.

## 2021-07-10 ENCOUNTER — Encounter: Payer: BLUE CROSS/BLUE SHIELD | Attending: Nurse Practitioner | Primary: Nurse Practitioner

## 2021-08-16 ENCOUNTER — Encounter

## 2021-08-16 ENCOUNTER — Ambulatory Visit
Admit: 2021-08-16 | Discharge: 2021-08-16 | Payer: BLUE CROSS/BLUE SHIELD | Attending: Nurse Practitioner | Primary: Nurse Practitioner

## 2021-08-16 DIAGNOSIS — M62830 Muscle spasm of back: Secondary | ICD-10-CM

## 2021-08-16 MED ORDER — LISINOPRIL 10 MG PO TABS
10 MG | ORAL_TABLET | Freq: Every day | ORAL | 1 refills | Status: DC
Start: 2021-08-16 — End: 2021-12-25

## 2021-08-16 MED ORDER — CYCLOBENZAPRINE HCL 10 MG PO TABS
10 MG | ORAL_TABLET | Freq: Every day | ORAL | 3 refills | Status: AC
Start: 2021-08-16 — End: 2021-12-25

## 2021-08-16 MED ORDER — SERTRALINE HCL 50 MG PO TABS
50 MG | ORAL_TABLET | Freq: Every day | ORAL | 1 refills | Status: AC
Start: 2021-08-16 — End: 2021-12-25

## 2021-08-16 MED ORDER — METOPROLOL SUCCINATE ER 50 MG PO TB24
50 MG | ORAL_TABLET | Freq: Two times a day (BID) | ORAL | 1 refills | Status: AC
Start: 2021-08-16 — End: 2021-12-25

## 2021-08-16 MED ORDER — LEVOTHYROXINE SODIUM 75 MCG PO TABS
75 MCG | ORAL_TABLET | Freq: Every day | ORAL | 1 refills | Status: AC
Start: 2021-08-16 — End: 2021-12-25

## 2021-08-16 NOTE — Progress Notes (Addendum)
[facility]  [address]  [place], [place] [address]   (ph) [phone] (fax) [phone]  Sandra Offield L. Cothran-Pate APRN, FNP-C      Chief Complaint   Patient presents with    3 Month Follow-Up    Hypertension       66 year old female  comes in as a recheck to chronic issues and to get med refills.  . She has had her colonoscopy  and Dexa scan done. She now needs her mammogram scheduled.She denies smoking. She has been evaluated by cardiology and reports everything checked out ok. She is still on Toprol and reports palpitations have improved.     Hypertension      Allergies   Allergen Reactions    Atorvastatin Other (See Comments)     Muscle pain    Hydrocodone-Acetaminophen Itching    Penicillins Hives       Past Medical History:   Diagnosis Date    Arthritis     Chronic back pain     Hypertension     Hyperthyroidism     Thyroid disease     hypothyroid       Family History   Problem Relation Age of Onset    Stroke Brother     Diabetes Brother     Alcohol Abuse Brother     Early Death Brother     Heart Attack Brother     High Blood Pressure Brother     Substance Abuse Brother     Liver Disease Father     Alcohol Abuse Father     Asthma Mother     Lung Disease Mother     High Blood Pressure Mother     Alcohol Abuse Brother     Diabetes Brother     High Blood Pressure Brother     Substance Abuse Brother     Early Death Sister        Social History     Socioeconomic History    Marital status: Divorced     Spouse name: Not on file    Number of children: Not on file    Years of education: Not on file    Highest education level: Not on file   Occupational History    Not on file   Tobacco Use    Smoking status: Former     Packs/day: 1.00     Years: 15.00     Pack years: 15.00     Types: Cigarettes     Start date: 36     Quit date: 10/25/1996     Years since quitting: 24.8     Passive exposure: Never    Smokeless tobacco: Former   Substance and Sexual Activity    Alcohol use: No     Drug use: No    Sexual activity: Not Currently     Partners: Male   Other Topics Concern    Not on file   Social History Narrative    Not on file     Social Determinants of Health     Financial Resource Strain: Low Risk     Difficulty of Paying Living Expenses: Not very hard   Food Insecurity: No Food Insecurity    Worried About Running Out of Food in the Last Year: Never true    Ran Out of Food in the Last Year: Never true   Transportation Needs: No Transportation Needs    Lack of Transportation (Medical):  No    Lack of Transportation (Non-Medical): No   Physical Activity: Not on file   Stress: Not on file   Social Connections: Not on file   Intimate Partner Violence: Not on file   Housing Stability: Not on file       OB History       Gravida   4    Para        Term        Preterm        AB        Living             SAB        IAB        Ectopic        Molar        Multiple        Live Births                    Past Surgical History:   Procedure Laterality Date    APPENDECTOMY      CERVICAL DISCECTOMY  09/16/06    Anterior C5-C6 diskectomy with decompression;  anterior cervical arthrodesis interbody technique;  anterior cervical instrumentation;  tricortical iliac crest bone graft harvested through a separate incision.     CHOLECYSTECTOMY      TAH AND BSO (CERVIX REMOVED)      TUBAL LIGATION         Health Maintenance   Topic Date Due    HIV screen  Never done    COVID-19 Vaccine (5 - Booster) 02/11/2020    Annual Wellness Visit (AWV)  08/18/2020    Flu vaccine (Season Ended) 09/25/2021    Depression Screen  08/15/2022    Breast cancer screen  08/20/2022    DTaP/Tdap/Td vaccine (2 - Td or Tdap) 09/26/2022    Colorectal Cancer Screen  11/27/2023    Lipids  03/26/2026    Shingles vaccine  Completed    Hepatitis C screen  Completed    Hepatitis A vaccine  Aged Out    Hib vaccine  Aged Out    Meningococcal (ACWY) vaccine  Aged Out    Pneumococcal 0-64 years Vaccine  Aged Out    Diabetes screen  Discontinued    Cervical  cancer screen  Discontinued         Current Outpatient Medications:     alendronate (FOSAMAX) 70 MG tablet, , Disp: , Rfl:     vitamin D (ERGOCALCIFEROL) 1.25 MG (50000 UT) CAPS capsule, , Disp: , Rfl:     cyclobenzaprine (FLEXERIL) 10 MG tablet, Take 1 tablet by mouth daily, Disp: 30 tablet, Rfl: 3    levothyroxine (SYNTHROID) 75 MCG tablet, Take 1 tablet by mouth every morning (before breakfast), Disp: 90 tablet, Rfl: 1    sertraline (ZOLOFT) 50 MG tablet, Take 1 tablet by mouth daily, Disp: 90 tablet, Rfl: 1    lisinopril (PRINIVIL;ZESTRIL) 10 MG tablet, Take 1 tablet by mouth daily, Disp: 90 tablet, Rfl: 1    metoprolol succinate (TOPROL XL) 50 MG extended release tablet, Take 1 tablet by mouth 2 times daily, Disp: 180 tablet, Rfl: 1    lisinopril (PRINIVIL;ZESTRIL) 20 MG tablet, Take 0.5 tablets by mouth daily TAKING 1/2 TABLET, Disp: 30 tablet, Rfl: 0    aspirin 81 MG chewable tablet, Take by mouth daily, Disp: , Rfl:     Review of Systems   Constitutional: Negative.    HENT: Negative.  Eyes: Negative.    Respiratory: Negative.     Cardiovascular: Negative.    Gastrointestinal: Negative.    Endocrine: Negative.    Genitourinary: Negative.    Musculoskeletal: Negative.    Skin: Negative.    Allergic/Immunologic: Negative.    Neurological: Negative.    Hematological: Negative.    Psychiatric/Behavioral: Negative.        Vitals:    08/16/21 0901   BP: 136/88   Pulse:    Temp:    SpO2:         Physical Exam  Constitutional:       Appearance: Normal appearance.   HENT:      Head: Normocephalic.      Nose: Nose normal.   Eyes:      Extraocular Movements: Extraocular movements intact.      Pupils: Pupils are equal, round, and reactive to light.   Cardiovascular:      Rate and Rhythm: Normal rate and regular rhythm.   Pulmonary:      Effort: Pulmonary effort is normal.      Breath sounds: Normal breath sounds.   Abdominal:      General: Abdomen is flat.      Palpations: Abdomen is soft.   Musculoskeletal:          General: Normal range of motion.      Cervical back: Normal range of motion and neck supple.   Skin:     General: Skin is warm and dry.   Neurological:      General: No focal deficit present.      Mental Status: She is alert and oriented to person, place, and time.   Psychiatric:         Mood and Affect: Mood normal.         Behavior: Behavior normal.          PHQ Scores 08/14/2021 04/04/2021 03/23/2021 01/09/2021 09/13/2020 06/02/2020 05/23/2020   PHQ2 Score 0 0 0 0 0 0 -   PHQ2 Score - - - - - - 0   PHQ9 Score 0 0 0 0 0 0 -        Assessment & Plan:    1. Back muscle spasm    - cyclobenzaprine (FLEXERIL) 10 MG tablet; Take 1 tablet by mouth daily  Dispense: 30 tablet; Refill: 3    2. Hypothyroidism, unspecified type  - levothyroxine (SYNTHROID) 75 MCG tablet; Take 1 tablet by mouth every morning (before breakfast)  Dispense: 90 tablet; Refill: 1  - TSH with Reflex; Future    3. Anxiety  - sertraline (ZOLOFT) 50 MG tablet; Take 1 tablet by mouth daily  Dispense: 90 tablet; Refill: 1    4. Mixed hyperlipidemia  - Lipid Panel; Future    5. Breast screening  - MAM DIGITAL SCREEN W OR WO CAD BILATERAL; Future    Greater than 50% counseling and/or coordination of care: the treatment regimen is extensive; detailed review. Will notify of labs. AWV in October. This note was dictated using dragon voice recognition software.  It has been proofread, but there may still exist voice recognition errors that the author did not detect.      Signed By: Erskine Squibb, APRN - CNP     August 16, 2021

## 2021-08-16 NOTE — Other (Signed)
Results to pt. Chol=171, trig=174, HDL=51, LDL=85.2.  Continue low carb diet. TSH is wnl.  Thanks

## 2021-08-17 LAB — LIPID PANEL
Chol/HDL Ratio: 3.4
Cholesterol, Total: 171 MG/DL (ref <200)
HDL: 51 MG/DL (ref 40–60)
LDL Calculated: 85.2 MG/DL (ref <100)
Triglycerides: 174 MG/DL — ABNORMAL HIGH (ref 35–150)
VLDL Cholesterol Calculated: 34.8 MG/DL — ABNORMAL HIGH (ref 6.0–23.0)

## 2021-08-17 LAB — TSH: TSH, 3RD GENERATION: 2.24 u[IU]/mL (ref 0.358–3.740)

## 2021-08-29 ENCOUNTER — Encounter

## 2021-09-04 ENCOUNTER — Inpatient Hospital Stay: Admit: 2021-09-04 | Payer: BLUE CROSS/BLUE SHIELD | Primary: Nurse Practitioner

## 2021-09-04 DIAGNOSIS — Z1231 Encounter for screening mammogram for malignant neoplasm of breast: Secondary | ICD-10-CM

## 2021-09-04 NOTE — Other (Signed)
IMPRESSION:  No mammographic evidence of malignancy.      Recommend annual mammogram in one year.  A reminder letter will be scheduled.    BI-RADS Assessment Category 1: Negative.

## 2021-11-16 ENCOUNTER — Encounter

## 2021-11-16 ENCOUNTER — Inpatient Hospital Stay: Admit: 2021-11-16 | Payer: BLUE CROSS/BLUE SHIELD | Primary: Nurse Practitioner

## 2021-11-16 DIAGNOSIS — M5489 Other dorsalgia: Secondary | ICD-10-CM

## 2021-12-11 ENCOUNTER — Inpatient Hospital Stay
Admit: 2021-12-11 | Discharge: 2021-12-11 | Disposition: A | Discharge status: Home / Self Care | Payer: BLUE CROSS/BLUE SHIELD

## 2021-12-11 ENCOUNTER — Emergency Department: Admit: 2021-12-11 | Payer: BLUE CROSS/BLUE SHIELD | Primary: Nurse Practitioner

## 2021-12-11 DIAGNOSIS — M25521 Pain in right elbow: Secondary | ICD-10-CM

## 2021-12-11 MED ORDER — ACETAMINOPHEN 500 MG PO TABS
500 MG | ORAL | Status: AC
Start: 2021-12-11 — End: 2021-12-11
  Administered 2021-12-11: 17:00:00 1000 mg via ORAL

## 2021-12-11 MED ORDER — MELOXICAM 15 MG PO TABS
15 MG | ORAL_TABLET | Freq: Every day | ORAL | 0 refills | Status: DC
Start: 2021-12-11 — End: 2022-10-03

## 2021-12-11 MED FILL — TYLENOL EXTRA STRENGTH 500 MG PO TABS: 500 MG | ORAL | Qty: 2

## 2021-12-11 NOTE — Discharge Instructions (Signed)
Your x-rays today were normal.  You may use a sling for comfort for the next 2 to 3 days but do not wear it any longer than that as the risk of frozen shoulder as we discussed.  Be sure to mobilize your shoulder and elbow daily.  Use Mobic once daily for pain.  You may follow-up with orthopedics as needed.  Return here for new or worsening symptoms.    As we discussed, I did not find a life threatening cause of your symptoms today. However, THAT DOES NOT MEAN IT COULD NOT DEVELOP. If you develop ANY new or worsening symptoms, it is critical that you return for re-evaluation. This includes any symptoms that are concerning to you, especially symptoms such as fevers, weakness.  If you do not return for re-evaluation, you risk serious complications, including death.

## 2021-12-11 NOTE — ED Provider Notes (Signed)
Emergency Department Provider Note       PCP: Cothran-Pate, Delfin Edis, APRN - CNP   Age: 66 y.o.   Sex: female     DISPOSITION Discharge - Pending Orders Complete 12/11/2021 01:31:39 PM       ICD-10-CM    1. Right elbow pain  M25.521           Medical Decision Making     Complexity of Problems Addressed:  1 acute injury    Data Reviewed and Analyzed:  I independently ordered and reviewed each unique test.         I interpreted the X-rays no fracture.  RADIOLOGY DISCLAIMER: Although I do my best to interpret the imaging, I am not a board-certified radiologist.  I am still basing my medical decision making off of the board-certified radiology interpretation provided to me.    Discussion of management or test interpretation.  In summary this is a 66 year old female patient presented for evaluation of direct injury to the right elbow.  Suspect olecranon contusion. Based on my evaluation I feel the patient is at low risk for alternative causes such as compartment syndrome, distal neurovascular injury, open fracture. The reasoning behind my decision process is that the patient is overall well-appearing with no acute distress noted. There is no presence of an open wound that would be indicative of open fracture. Compartments are soft and nontender without evidence of compartment syndrome. Distal vasculature and sensation is intact with brisk capillary refill. My independent interpretation of initial x-ray evaluation is grossly unremarkable. However, given the mechanism of injury and swelling I recommend immobilization. The plan for this patient is Outpatient management.  We will utilize a sling for the next few days.  I have advised the patient not to wear it longer than 3 days to avoid frozen shoulder.  Recommended continued mobilization of the arm and shoulder.  Recommended follow-up with orthopedics as needed.  I have specifically counseled the patient on warning signs to return immediately for including but not  limited to signs of compartment syndrome, infection. The patient has verbalized understanding and is in agreement with the treatment plan.         Risk of Complications and/or Morbidity of Patient Management:  Prescription drug management performed.  Patient was discharged risks and benefits of hospitalization were considered.  Shared medical decision making was utilized in creating the patients health plan today.    ED Course as of 12/11/21 1342   Tue Dec 11, 2021   1330 1:30 PM  Independent interpretation of right elbow without any fracture or posterior fat pad sign. [NR]      ED Course User Index  [NR] Bailey Mech, [place]       History       66 year old female patient presents today complaining of right elbow pain that occurred about an hour prior to arrival.  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  She reports her arm was not hyperextended.  She states she has no other injuries.  She does note some intermittent tingling in the right hand since the injury.  She denies any head injury.  Denies loss of range of motion.  Denies shoulder or wrist pain.  Denies swelling.  Denies laceration.  Patient is primary historian.    The history is provided by the patient. No language interpreter was used.        Review of Systems  Constitutional:  Negative for fever.   HENT:  Negative for facial swelling and trouble swallowing.    Eyes:  Negative for photophobia and visual disturbance.   Respiratory:  Negative for cough and shortness of breath.    Cardiovascular:  Negative for chest pain.   Gastrointestinal:  Negative for abdominal pain and vomiting.   Genitourinary:  Negative for dysuria.   Musculoskeletal:  Positive for arthralgias. Negative for myalgias and neck stiffness.   Skin:  Negative for rash and wound.   Neurological:  Negative for dizziness, syncope, weakness and light-headedness.   Psychiatric/Behavioral:  Negative for self-injury and  suicidal ideas.    All other systems reviewed and are negative.      Physical Exam     Vitals signs and nursing note reviewed:  Vitals:    12/11/21 1257   BP: (!) 151/87   Pulse: 92   Resp: 16   Temp: 98.7 F (37.1 C)   TempSrc: Oral   SpO2: 98%   Weight: 170 lb (77.1 kg)   Height: 5' (1.524 m)      Physical Exam  Vitals and nursing note reviewed.   Constitutional:       General: She is not in acute distress.     Appearance: Normal appearance. She is obese. She is not ill-appearing, toxic-appearing or diaphoretic.   HENT:      Head: Normocephalic and atraumatic.      Right Ear: External ear normal.      Left Ear: External ear normal.      Nose: Nose normal.      Mouth/Throat:      Mouth: Mucous membranes are moist.   Eyes:      General: No scleral icterus.        Right eye: No discharge.         Left eye: No discharge.      Conjunctiva/sclera: Conjunctivae normal.   Cardiovascular:      Rate and Rhythm: Normal rate and regular rhythm.      Pulses: Normal pulses.      Heart sounds: Normal heart sounds.   Pulmonary:      Effort: Pulmonary effort is normal. No respiratory distress.      Breath sounds: Normal breath sounds. No stridor. No wheezing, rhonchi or rales.   Musculoskeletal:         General: Normal range of motion.      Right shoulder: Normal.      Right upper arm: Normal.      Right elbow: No swelling, deformity, effusion or lacerations. Normal range of motion. Tenderness (Over the olecranon) present.      Right forearm: Normal.      Right wrist: Normal. No snuff box tenderness. Normal pulse.      Right hand: Normal. Normal sensation. Normal capillary refill.      Cervical back: Normal range of motion and neck supple.   Skin:     General: Skin is warm and dry.      Capillary Refill: Capillary refill takes less than 2 seconds.      Coloration: Skin is not jaundiced.   Neurological:      General: No focal deficit present.      Mental Status: She is alert. Mental status is at baseline.          Procedures      Procedures    Orders Placed This Encounter   Procedures    XR ELBOW RIGHT (MIN 3 VIEWS)  [facility] Arm Sling, Right; M        Medications given during this emergency department visit:  Medications   acetaminophen (TYLENOL) tablet 1,000 mg (1,000 mg Oral Given 12/11/21 1321)       New Prescriptions    MELOXICAM (MOBIC) 15 MG TABLET    Take 1 tablet by mouth daily        Past Medical History:   Diagnosis Date    Arthritis     Chronic back pain     Hypertension     Hyperthyroidism     Thyroid disease     hypothyroid        Past Surgical History:   Procedure Laterality Date    APPENDECTOMY      CERVICAL DISCECTOMY  09/16/06    Anterior C5-C6 diskectomy with decompression;  anterior cervical arthrodesis interbody technique;  anterior cervical instrumentation;  tricortical iliac crest bone graft harvested through a separate incision.     CHOLECYSTECTOMY      TAH AND BSO (CERVIX REMOVED)      TUBAL LIGATION          Social History     Socioeconomic History    Marital status: Divorced     Spouse name: None    Number of children: None    Years of education: None    Highest education level: None   Tobacco Use    Smoking status: Former     Packs/day: 1.00     Years: 15.00     Additional pack years: 0.00     Total pack years: 15.00     Types: Cigarettes     Start date: 70     Quit date: 10/25/1996     Years since quitting: 25.1     Passive exposure: Never    Smokeless tobacco: Former   Substance and Sexual Activity    Alcohol use: No    Drug use: No    Sexual activity: Not Currently     Partners: Male     Social Determinants of Health     Financial Resource Strain: Low Risk  (09/13/2020)    Overall Financial Resource Strain (CARDIA)     Difficulty of Paying Living Expenses: Not very hard   Food Insecurity: No Food Insecurity (09/13/2020)    Hunger Vital Sign     Worried About Running Out of Food in the Last Year: Never true     Ran Out of Food in the Last Year: Never true   Transportation Needs: No  Transportation Needs (09/13/2020)    PRAPARE - Therapist, art (Medical): No     Lack of Transportation (Non-Medical): No        Previous Medications    ALENDRONATE (FOSAMAX) 70 MG TABLET        ASPIRIN 81 MG CHEWABLE TABLET    Take by mouth daily    CYCLOBENZAPRINE (FLEXERIL) 10 MG TABLET    Take 1 tablet by mouth daily    LEVOTHYROXINE (SYNTHROID) 75 MCG TABLET    Take 1 tablet by mouth every morning (before breakfast)    LISINOPRIL (PRINIVIL;ZESTRIL) 10 MG TABLET    Take 1 tablet by mouth daily    LISINOPRIL (PRINIVIL;ZESTRIL) 20 MG TABLET    Take 0.5 tablets by mouth daily TAKING 1/2 TABLET    METOPROLOL SUCCINATE (TOPROL XL) 50 MG EXTENDED RELEASE TABLET    Take 1 tablet by mouth 2 times daily    SERTRALINE (  ZOLOFT) 50 MG TABLET    Take 1 tablet by mouth daily    VITAMIN D (ERGOCALCIFEROL) 1.25 MG (50000 UT) CAPS CAPSULE            Results for orders placed or performed during the hospital encounter of 12/11/21   XR ELBOW RIGHT (MIN 3 VIEWS)    Narrative    History: Right elbow injury with hand numbness    EXAM: 3 views right elbow    FINDINGS: No fracture, dislocation, or joint effusion.      Impression    No acute findings        XR ELBOW RIGHT (MIN 3 VIEWS)   Final Result   No acute findings                        Voice dictation software was used during the making of this note.  This software is not perfect and grammatical and other typographical errors may be present.  This note has not been completely proofread for errors.        Bailey Mech, [place]  12/11/21 1342

## 2021-12-25 ENCOUNTER — Encounter

## 2021-12-25 ENCOUNTER — Encounter
Admit: 2021-12-25 | Discharge: 2021-12-25 | Payer: BLUE CROSS/BLUE SHIELD | Attending: Nurse Practitioner | Primary: Nurse Practitioner

## 2021-12-25 DIAGNOSIS — M62830 Muscle spasm of back: Secondary | ICD-10-CM

## 2021-12-25 LAB — CBC WITH AUTO DIFFERENTIAL
Absolute Immature Granulocyte: 0 10*3/uL (ref 0.0–0.5)
Basophils %: 1 % (ref 0.0–2.0)
Basophils Absolute: 0.1 10*3/uL (ref 0.0–0.2)
Eosinophils %: 4 % (ref 0.5–7.8)
Eosinophils Absolute: 0.2 10*3/uL (ref 0.0–0.8)
Hematocrit: 41.9 % (ref 35.8–46.3)
Hemoglobin: 13.6 g/dL (ref 11.7–15.4)
Immature Granulocytes: 0 % (ref 0.0–5.0)
Lymphocytes %: 47 % — ABNORMAL HIGH (ref 13–44)
Lymphocytes Absolute: 2.3 10*3/uL (ref 0.5–4.6)
MCH: 30.8 PG (ref 26.1–32.9)
MCHC: 32.5 g/dL (ref 31.4–35.0)
MCV: 94.8 FL (ref 82–102)
MPV: 10.3 FL (ref 9.4–12.3)
Monocytes %: 9 % (ref 4.0–12.0)
Monocytes Absolute: 0.4 10*3/uL (ref 0.1–1.3)
Neutrophils %: 39 % — ABNORMAL LOW (ref 43–78)
Neutrophils Absolute: 1.9 10*3/uL (ref 1.7–8.2)
Platelets: 245 10*3/uL (ref 150–450)
RBC: 4.42 M/uL (ref 4.05–5.2)
RDW: 13.3 % (ref 11.9–14.6)
WBC: 4.9 10*3/uL (ref 4.3–11.1)
nRBC: 0 10*3/uL (ref 0.0–0.2)

## 2021-12-25 LAB — AMB POC HEMOGLOBIN A1C: Hemoglobin A1C, POC: 5.4 %

## 2021-12-25 MED ORDER — CYCLOBENZAPRINE HCL 10 MG PO TABS
10 MG | ORAL_TABLET | Freq: Every day | ORAL | 3 refills | Status: DC
Start: 2021-12-25 — End: 2022-07-15

## 2021-12-25 MED ORDER — METOPROLOL SUCCINATE ER 50 MG PO TB24
50 MG | ORAL_TABLET | Freq: Two times a day (BID) | ORAL | 1 refills | Status: DC
Start: 2021-12-25 — End: 2022-07-03

## 2021-12-25 MED ORDER — LEVOTHYROXINE SODIUM 75 MCG PO TABS
75 MCG | ORAL_TABLET | Freq: Every day | ORAL | 1 refills | Status: DC
Start: 2021-12-25 — End: 2022-07-03

## 2021-12-25 MED ORDER — SERTRALINE HCL 50 MG PO TABS
50 MG | ORAL_TABLET | Freq: Every day | ORAL | 1 refills | Status: AC
Start: 2021-12-25 — End: 2022-07-03

## 2021-12-25 MED ORDER — LISINOPRIL 10 MG PO TABS
10 MG | ORAL_TABLET | Freq: Every day | ORAL | 1 refills | Status: DC
Start: 2021-12-25 — End: 2022-07-03

## 2021-12-25 NOTE — Other (Signed)
To pt. A1C was 5.4. This is wnl.     Thanks

## 2021-12-25 NOTE — Progress Notes (Signed)
[facility]  [address]  [place], [place] [id number]   (ph) [phone] (fax) [phone]  Manvir Prabhu L. Cothran-Pate APRN, FNP-C      Chief Complaint   Patient presents with    Medicare AWV     Pt presents today for an AWV.  Pt would like some blood work done for Vit D. Pt would like to talk about aneurysm prevention.       66 yo female comes in for her AWV. She is requesting  an abdominal ultrasound due to her mom having an abdominal aortic aneurysm.         Allergies   Allergen Reactions    Atorvastatin Other (See Comments)     Muscle pain    Hydrocodone-Acetaminophen Itching    Penicillins Hives       Past Medical History:   Diagnosis Date    Arthritis     Chronic back pain     Hypertension     Hyperthyroidism     Thyroid disease     hypothyroid       Family History   Problem Relation Age of Onset    Asthma Mother     Lung Disease Mother     High Blood Pressure Mother     Liver Disease Father     Alcohol Abuse Father     Early Death Sister     Stroke Brother     Diabetes Brother     Alcohol Abuse Brother     Early Death Brother     Heart Attack Brother     High Blood Pressure Brother     Substance Abuse Brother     Alcohol Abuse Brother     Diabetes Brother     High Blood Pressure Brother     Substance Abuse Brother     Breast Cancer Neg Hx        Social History     Socioeconomic History    Marital status: Divorced     Spouse name: Not on file    Number of children: Not on file    Years of education: Not on file    Highest education level: Not on file   Occupational History    Not on file   Tobacco Use    Smoking status: Former     Packs/day: 1.00     Years: 15.00     Additional pack years: 0.00     Total pack years: 15.00     Types: Cigarettes     Start date: [id number]     Quit date: 10/25/1996     Years since quitting: 25.1     Passive exposure: Never    Smokeless tobacco: Former   Substance and Sexual Activity    Alcohol use: No    Drug use: No    Sexual activity: Not Currently      Partners: Male   Other Topics Concern    Not on file   Social History Narrative    Not on file     Social Determinants of Health     Financial Resource Strain: Low Risk  (09/13/2020)    Overall Financial Resource Strain (CARDIA)     Difficulty of Paying Living Expenses: Not very hard   Food Insecurity: No Food Insecurity (09/13/2020)    Hunger Vital Sign     Worried About Running Out of Food in the Last Year: Never true     Ran Out  of Food in the Last Year: Never true   Transportation Needs: No Transportation Needs (09/13/2020)    PRAPARE - Therapist, art (Medical): No     Lack of Transportation (Non-Medical): No   Physical Activity: Unknown (12/22/2021)    Exercise Vital Sign     Days of Exercise per Week: 0 days     Minutes of Exercise per Session: Not on file   Stress: Not on file   Social Connections: Not on file   Intimate Partner Violence: Not on file   Housing Stability: Not on file       OB History    No obstetric history on file.         Past Surgical History:   Procedure Laterality Date    APPENDECTOMY      CERVICAL DISCECTOMY  09/16/06    Anterior C5-C6 diskectomy with decompression;  anterior cervical arthrodesis interbody technique;  anterior cervical instrumentation;  tricortical iliac crest bone graft harvested through a separate incision.     CHOLECYSTECTOMY      TAH AND BSO (CERVIX REMOVED)      TUBAL LIGATION         Health Maintenance   Topic Date Due    HIV screen  Never done    COVID-19 Vaccine (5 - Pfizer series) 02/11/2020    Pneumococcal 65+ years Vaccine (1 - PCV) Never done    DTaP/Tdap/Td vaccine (2 - Td or Tdap) 09/26/2022    Depression Screen  12/23/2022    Annual Wellness Visit (AWV)  12/26/2022    Breast cancer screen  09/05/2023    Colorectal Cancer Screen  11/27/2023    Lipids  08/17/2026    DEXA (modify frequency per FRAX score)  Completed    Flu vaccine  Completed    Shingles vaccine  Completed    Hepatitis C screen  Completed    Hepatitis A vaccine  Aged  Out    Hepatitis B vaccine  Aged Out    Hib vaccine  Aged Out    Meningococcal (ACWY) vaccine  Aged Out    Diabetes screen  Discontinued    Cervical cancer screen  Discontinued         Current Outpatient Medications:     etanercept (ENBREL) 50 MG/ML injection, Inject 0.5 mLs into the skin once, Disp: , Rfl:     cyclobenzaprine (FLEXERIL) 10 MG tablet, Take 1 tablet by mouth daily, Disp: 30 tablet, Rfl: 3    levothyroxine (SYNTHROID) 75 MCG tablet, Take 1 tablet by mouth every morning (before breakfast), Disp: 90 tablet, Rfl: 1    sertraline (ZOLOFT) 50 MG tablet, Take 1 tablet by mouth daily, Disp: 90 tablet, Rfl: 1    lisinopril (PRINIVIL;ZESTRIL) 10 MG tablet, Take 1 tablet by mouth daily, Disp: 90 tablet, Rfl: 1    metoprolol succinate (TOPROL XL) 50 MG extended release tablet, Take 1 tablet by mouth 2 times daily, Disp: 180 tablet, Rfl: 1    meloxicam (MOBIC) 15 MG tablet, Take 1 tablet by mouth daily, Disp: 30 tablet, Rfl: 0    alendronate (FOSAMAX) 70 MG tablet, , Disp: , Rfl:     vitamin D (ERGOCALCIFEROL) 1.25 MG (50000 UT) CAPS capsule, , Disp: , Rfl:     aspirin 81 MG chewable tablet, Take by mouth daily, Disp: , Rfl:     Review of Systems   Constitutional: Negative.    HENT: Negative.     Eyes: Negative.  Respiratory: Negative.     Cardiovascular: Negative.    Gastrointestinal: Negative.    Endocrine: Negative.    Genitourinary: Negative.    Musculoskeletal: Negative.    Skin: Negative.    Allergic/Immunologic: Negative.    Neurological: Negative.    Hematological: Negative.    Psychiatric/Behavioral: Negative.          Vitals:    12/25/21 0813   BP: 124/84   Pulse: 90   Temp: 97.9 F (36.6 C)   SpO2: 98%        Physical Exam  Constitutional:       Appearance: Normal appearance.   HENT:      Head: Normocephalic.      Nose: Nose normal.   Eyes:      Extraocular Movements: Extraocular movements intact.      Pupils: Pupils are equal, round, and reactive to light.   Cardiovascular:      Rate and Rhythm:  Normal rate and regular rhythm.   Pulmonary:      Effort: Pulmonary effort is normal.      Breath sounds: Normal breath sounds.   Abdominal:      General: Abdomen is flat.      Palpations: Abdomen is soft.   Musculoskeletal:         General: Normal range of motion.      Cervical back: Normal range of motion and neck supple.   Skin:     General: Skin is warm and dry.   Neurological:      General: No focal deficit present.      Mental Status: She is alert and oriented to person, place, and time.   Psychiatric:         Mood and Affect: Mood normal.         Behavior: Behavior normal.                12/22/2021     7:38 PM 08/14/2021     9:38 PM 04/04/2021     4:02 PM 03/23/2021     3:34 PM 01/09/2021     3:47 PM 09/13/2020     3:27 PM 06/02/2020     1:01 PM   PHQ Scores   PHQ2 Score 0 0 0 0 0 0 0   PHQ9 Score 0 0 0 0 0 0 0        Assessment & Plan:    1. Back muscle spasm  stable  - cyclobenzaprine (FLEXERIL) 10 MG tablet; Take 1 tablet by mouth daily  Dispense: 30 tablet; Refill: 3    2. Hypothyroidism, unspecified type  stable  - levothyroxine (SYNTHROID) 75 MCG tablet; Take 1 tablet by mouth every morning (before breakfast)  Dispense: 90 tablet; Refill: 1  - TSH; Future    3. Primary hypertension  stable  - lisinopril (PRINIVIL;ZESTRIL) 10 MG tablet; Take 1 tablet by mouth daily  Dispense: 90 tablet; Refill: 1  - metoprolol succinate (TOPROL XL) 50 MG extended release tablet; Take 1 tablet by mouth 2 times daily  Dispense: 180 tablet; Refill: 1  - CBC with Auto Differential; Future  - Comprehensive Metabolic Panel; Future    4. Vitamin D deficiency  - Vitamin D 25 Hydroxy; Future    5. Mixed hyperlipidemia  - Lipid Panel; Future    6. Elevated glucose    - AMB POC HEMOGLOBIN A1C    7. Encounter for immunization    - Influenza, FLUAD, (age 50 y+), IM, Preservative Free, 0.5  mL    8. Encounter for annual wellness visit (AWV) in Medicare patient         Greater than 50% counseling and/or coordination of care: the treatment  regimen is extensive; detailed review. Will notify of labs. Recheck in 3 months. This note was dictated using dragon voice recognition software.  It has been proofread, but there may still exist voice recognition errors that the author did not detect.      Signed By: Erskine Squibb, APRN - CNP     December 25, 2021    1. Medicare annual wellness visit, subsequent    2. Screening for Depression  PHQ2=0    3. Screening for alcoholism  Pt denies drinking    4. Screening immunizations  Immunizations UTD other than tetanus and Prevnar.      5. Screening for Hep C  05/15/2021- negative    6. Screening for colon cancer  Colonoscopy 2020, due again 2025    7. Screening for breast cancer  Mammogram UTD-07/23    8. Screening for cervical cancer  Hysterectomy years ago; not due to cancer per pt.    9. Postmenopausal  Dexa scan 07/22; due 07/24    10. Routine eye exam  Pt scheduling appt.       Reviewed health care maintenance. All appropriate screenings ordered or discussed/offered that are recommended or due. Information provided on Advanced Directive,Living Will and POA with AVS. Pt to return for next Medicare Wellness in 1 year.  Medicare Annual Wellness Visit    Marco Adelson is here for Medicare AWV (Pt presents today for an AWV.  Pt would like some blood work done for Vit D. Pt would like to talk about aneurysm prevention.)    Assessment & Plan   Back muscle spasm  -     cyclobenzaprine (FLEXERIL) 10 MG tablet; Take 1 tablet by mouth daily, Disp-30 tablet, R-3Normal  Hypothyroidism, unspecified type  -     levothyroxine (SYNTHROID) 75 MCG tablet; Take 1 tablet by mouth every morning (before breakfast), Disp-90 tablet, R-1Normal  -     TSH; Future  Primary hypertension  -     lisinopril (PRINIVIL;ZESTRIL) 10 MG tablet; Take 1 tablet by mouth daily, Disp-90 tablet, R-1Normal  -     metoprolol succinate (TOPROL XL) 50 MG extended release tablet; Take 1 tablet by mouth 2 times daily, Disp-180 tablet, R-1Normal  -      CBC with Auto Differential; Future  -     Comprehensive Metabolic Panel; Future  Vitamin D deficiency  -     Vitamin D 25 Hydroxy; Future  Mixed hyperlipidemia  -     Lipid Panel; Future  Elevated glucose  -     AMB POC HEMOGLOBIN A1C  Encounter for immunization  -     Influenza, FLUAD, (age 81 y+), IM, Preservative Free, 0.5 mL  Encounter for annual wellness visit (AWV) in Medicare patient    Recommendations for Preventive Services Due: see orders and patient instructions/AVS.  Recommended screening schedule for the next 5-10 years is provided to the patient in written form: see Patient Instructions/AVS.     Return in about 3 months (around 03/27/2022).     Subjective       Patient's complete Health Risk Assessment and screening values have been reviewed and are found in Flowsheets. The following problems were reviewed today and where indicated follow up appointments were made and/or referrals ordered.    Positive Risk Factor Screenings with  Interventions:    Fall Risk:  Do you feel unsteady or are you worried about falling? : (!) yes  2 or more falls in past year?: (!) yes  Fall with injury in past year?: (!) yes     Interventions:    Patient comments: will get grab bars for shower    Cognitive:   Words recalled: 3 Words Recalled   Clock Drawing Test (CDT): (!) Abnormal       Total Score Interpretation: Normal Mini-Cog      Interventions:  Patient declines any further evaluation or treatment             Weight and Activity:  Physical Activity: Unknown (12/22/2021)    Exercise Vital Sign     Days of Exercise per Week: 0 days     Minutes of Exercise per Session: Not on file     On average, how many days per week do you engage in moderate to strenuous exercise (like a brisk walk)?: 0 days  Have you lost any weight without trying in the past 3 months?: No  Body mass index is 34.76 kg/m. (!) Abnormal  Obesity Interventions:  Patient declines any further evaluation or treatment          Dentist Screen:  Have you seen  the dentist within the past year?: (!) No    Intervention:  Patient comments: will go when she has insurance     Vision Screen:  Do you have difficulty driving, watching TV, or doing any of your daily activities because of your eyesight?: No  Have you had an eye exam within the past year?: (!) No  Vision Screening    Right eye Left eye Both eyes   Without correction 20/25 20/20 20/20    With correction          Interventions:   Patient comments: will schedule appt    Safety:  Do you have either shower bars, grab bars, non-slip mats or non-slip surfaces in your shower or bathtub?: (!) No  Interventions:  Patient declined any further interventions or treatment     Advanced Directives:  Do you have a Living Will?: (!) No    Intervention:  has NO advanced directive - information provided                       Objective   Vitals:    12/25/21 0813   BP: 124/84   Pulse: 90   Temp: 97.9 F (36.6 C)   SpO2: 98%   Weight: 80.7 kg (178 lb)      Body mass index is 34.76 kg/m.               Allergies   Allergen Reactions    Atorvastatin Other (See Comments)     Muscle pain    Hydrocodone-Acetaminophen Itching    Penicillins Hives     Prior to Visit Medications    Medication Sig Taking? Authorizing Provider   etanercept (ENBREL) 50 MG/ML injection Inject 0.5 mLs into the skin once Yes [provider]   cyclobenzaprine (FLEXERIL) 10 MG tablet Take 1 tablet by mouth daily Yes Cothran-Pate, Gabrille Kilbride L, APRN - CNP   levothyroxine (SYNTHROID) 75 MCG tablet Take 1 tablet by mouth every morning (before breakfast) Yes Cothran-Pate, Tyauna Lacaze L, APRN - CNP   sertraline (ZOLOFT) 50 MG tablet Take 1 tablet by mouth daily Yes Cothran-Pate, Kao Conry L, APRN - CNP   lisinopril (PRINIVIL;ZESTRIL) 10 MG tablet Take  1 tablet by mouth daily Yes Cothran-Pate, Zhara Gieske L, APRN - CNP   metoprolol succinate (TOPROL XL) 50 MG extended release tablet Take 1 tablet by mouth 2 times daily Yes Cothran-Pate, Oluwademilade Mckiver L, APRN - CNP   meloxicam (MOBIC) 15 MG tablet  Take 1 tablet by mouth daily Yes Bailey Mech, PA   alendronate (FOSAMAX) 70 MG tablet  Yes [provider]   vitamin D (ERGOCALCIFEROL) 1.25 MG (50000 UT) CAPS capsule  Yes [provider]   aspirin 81 MG chewable tablet Take by mouth daily Yes Automatic Reconciliation, Ar       CareTeam (Including outside providers/suppliers regularly involved in providing care):   Patient Care Team:  Cothran-Pate, Delfin Edis, APRN - CNP as PCP - General  Cothran-Pate, Delfin Edis, APRN - CNP as PCP - Empaneled Provider     Reviewed and updated this visit:  Allergies  Meds

## 2021-12-25 NOTE — Patient Instructions (Signed)
Preventing Falls: Care Instructions    Talk to your doctor about the medicines you take. Ask if any of them increase the risk of falls and whether they can be changed or stopped.   Try to exercise regularly. It can help improve your strength and balance. This can help lower your risk of falling.     Practice fall safety and prevention.    Wear low-heeled shoes that fit well and give your feet good support. Talk to your doctor if you have foot problems that make this hard.  Carry a cellphone or wear a medical alert device that you can use to call for help.  Use stepladders instead of chairs to reach high objects. Don't climb if you're at risk for falls. Ask for help, if needed.  Wear the correct eyeglasses, if you need them.    Make your home safer.    Remove rugs, cords, clutter, and furniture from walkways.  Keep your house well lit. Use night-lights in hallways and bathrooms.  Install and use sturdy handrails on stairways.  Wear nonskid footwear, even inside. Don't walk barefoot or in socks without shoes.    Be safe outside.    Use handrails, curb cuts, and ramps whenever possible.  Keep your hands free by using a shoulder bag or backpack.  Try to walk in well-lit areas. Watch out for uneven ground, changes in pavement, and debris.  Be careful in the winter. Walk on the grass or gravel when sidewalks are slippery. Use de-icer on steps and walkways. Add non-slip devices to shoes.    Put grab bars and nonskid mats in your shower or tub and near the toilet. Try to use a shower chair or bath bench when bathing.   Get into a tub or shower by putting in your weaker leg first. Get out with your strong side first. Have a phone or medical alert device in the bathroom with you.   Where can you learn more?  Go to https://www.bennett.info/ and enter G117 to learn more about "Preventing Falls: Care Instructions."  Current as of: July 18, 2023Content Version: 13.8   2006-2023 Healthwise,  Incorporated.   Care instructions adapted under license by [facility]. If you have questions about a medical condition or this instruction, always ask your healthcare professional. [place] any warranty or liability for your use of this information.           Learning About Mild Cognitive Impairment (MCI)  What is mild cognitive impairment (MCI)?     It's common to forget things sometimes as we get older. But some older people have memory loss that's more than normal aging. It's called mild cognitive impairment, or MCI. It is not the same as dementia.  People with the condition often know that their memory or mental function has changed. Tests may show some loss. But their minds work well overall. They can carry out daily tasks that are normal for them.  People with MCI have a higher chance of one day getting dementia. But not all people who have it will get dementia. Some people may stay the same over time.  What are the symptoms?  People with MCI have more memory loss than what occurs with normal aging. They may have increasing trouble with recalling words and keeping up with conversations. They may also have trouble remembering important events and making decisions.  What puts you at risk?  The risk of getting MCI increases with age. Having high blood pressure  or having a family history of MCI may also increase your risk.  How is it diagnosed?  Your doctor will do a physical exam.  You may be asked questions to check your memory and other mental skills. Your doctor may also talk to close friends and family members. This can help the doctor figure out how your memory and other mental skills have changed.  You may get blood tests and tests that look at your brain.  These questions and tests can make sure you don't have other conditions that can cause symptoms like MCI. These include depression, sleep problems, and side effects from medicines.  How is it treated?  There are no medicines  to treat MCI or to keep it from progressing to dementia. But treating conditions like high blood pressure and diabetes may help. A person with MCI needs routine follow-up visits with their doctor to check on changes in the person's mental skills.  How can you care for yourself at home?  Keeping your body active can help slow MCI. Exercises like walking can help. Try to stay active mentally too. Read or do things like crossword puzzles if you enjoy doing them.  If you need help coping with MCI, you may want to get support from family, friends, a support group, or a counselor who works with people who have [place].  Though the future isn't always clear, it can be good to plan ahead with instructions for your care. These are called advanced directives. Having a plan can help make sure that you get the care you want.  Current as of: May 1, 2023Content Version: 13.8   2006-2023 Healthwise, Incorporated.   Care instructions adapted under license by [facility]. If you have questions about a medical condition or this instruction, always ask your healthcare professional. [place] any warranty or liability for your use of this information.           Learning About Dental Care for Older Adults  Dental care for older adults: Overview  Dental care for older people is much the same as for younger adults. But older adults do have concerns that younger adults do not. Older adults may have problems with gum disease and decay on the roots of their teeth. They may need missing teeth replaced or broken fillings fixed. Or they may have dentures that need to be cared for. Some older adults may have trouble holding a toothbrush.  You can help remind the person you are caring for to brush and floss their teeth or to clean their dentures. In some cases, you may need to do the brushing and other dental care tasks. People who have trouble using their hands or who have dementia may need this extra  help.  How can you help with dental care?  Normal dental care  To keep the teeth and gums healthy:  Brush the teeth with fluoride toothpaste twice a day--in the morning and at night--and floss at least once a day. Plaque can quickly build up on the teeth of older adults.  Watch for the signs of gum disease. These signs include gums that bleed after brushing or after eating hard foods, such as apples.  See a dentist regularly. Many experts recommend checkups every 6 months.  Keep the dentist up to date on any new medications the person is taking.  Encourage a balanced diet that includes whole grains, vegetables, and fruits, and that is low in saturated fat and sodium.  Encourage the person  you're caring for not to use tobacco products. They can affect dental and general health.  Many older adults have a fixed income and feel that they can't afford dental care. But most towns and cities have programs in which dentists help older adults by lowering fees. Contact your area's public health offices or social services for information about dental care in your area.  Using a toothbrush  Older adults with arthritis sometimes have trouble brushing their teeth because they can't easily hold the toothbrush. Their hands and fingers may be stiff, painful, or weak. If this is the case, you can:  Offer an IT trainer toothbrush.  Enlarge the handle of a non-electric toothbrush by wrapping a sponge, an elastic bandage, or adhesive tape around it.  Push the toothbrush handle through a ball made of rubber or soft foam.  Make the handle longer and thicker by taping Popsicle sticks or tongue depressors to it.  You may also be able to buy special toothbrushes, toothpaste dispensers, and floss holders.  Your doctor may recommend a soft-bristle toothbrush if the person you care for bleeds easily. Bleeding can happen because of a health problem or from certain medicines.  A toothpaste for sensitive teeth may help if the person you care for has  sensitive teeth.  How do you brush and floss someone's teeth?  If the person you are caring for has a hard time cleaning their teeth on their own, you may need to brush and floss their teeth for them. It may be easiest to have the person sit and face away from you, and to sit or stand behind them. That way you can steady their head against your arm as you reach around to floss and brush their teeth. Choose a place that has good lighting and is comfortable for both of you.  Before you begin, gather your supplies. You will need gloves, floss, a toothbrush, and a container to hold water if you are not near a sink. Wash and dry your hands well and put on gloves. Start by flossing:  Gently work a piece of floss between each of the teeth toward the gums. A plastic flossing tool may make this easier, and they are available at most drugstores.  Curve the floss around each tooth into a U-shape and gently slide it under the gum line.  Move the floss firmly up and down several times to scrape off the plaque.  After you've finished flossing, throw away the used floss and begin brushing:  Wet the brush and apply toothpaste.  Place the brush at a 45-degree angle where the teeth meet the gums. Press firmly, and move the brush in small circles over the surface of the teeth.  Be careful not to brush too hard. Vigorous brushing can make the gums pull away from the teeth and can scratch the tooth enamel.  Brush all surfaces of the teeth, on the tongue side and on the cheek side. Pay special attention to the front teeth and all surfaces of the back teeth.  Brush chewing surfaces with short back-and-forth strokes.  After you've finished, help the person rinse the remaining toothpaste from their mouth.  Where can you learn more?  Go to https://www.bennett.info/ and enter F944 to learn more about "[place] for Older Adults."  Current as of: November 13, 2022Content Version: 13.8   2006-2023  Healthwise, Incorporated.   Care instructions adapted under license by [facility]. If you have questions about a medical condition or this  instruction, always ask your healthcare professional. [place] any warranty or liability for your use of this information.           Learning About Vision Tests  What are vision tests?     The four most common vision tests are visual acuity tests, refraction, visual field tests, and color vision tests.  Visual acuity (sharpness) tests  These tests are used:  To see if you need glasses or contact lenses.  To monitor an eye problem.  To check an eye injury.  Visual acuity tests are done as part of routine exams. You may also have this test when you get your driver's license or apply for some types of jobs.  Visual field tests  These tests are used:  To check for vision loss in any area of your range of vision.  To screen for certain eye diseases.  To look for nerve damage after a stroke, head injury, or other problem that could reduce blood flow to the brain.  Refraction and color tests  A refraction test is done to find the right prescription for glasses and contact lenses.  A color vision test is done to check for color blindness.  Color vision is often tested as part of a routine exam. You may also have this test when you apply for a job where recognizing different colors is important, such as truck driving, Research officer, trade union, or the [employer].  How are vision tests done?  Visual acuity test   You cover one eye at a time.  You read aloud from a wall chart across the room.  You read aloud from a small card that you hold in your hand.  Refraction   You look into a special device.  The device puts lenses of different strengths in front of each eye to see how strong your glasses or contact lenses need to be.  Visual field tests   Your doctor may have you look through special machines.  Or your doctor may simply have you stare straight ahead while they move a  finger into and out of your field of vision.  Color vision test   You look at pieces of printed test patterns in various colors. You say what number or symbol you see.  Your doctor may have you trace the number or symbol using a pointer.  How do these tests feel?  There is very little chance of having a problem from this test. If dilating drops are used for a vision test, they may make the eyes sting and cause a medicine taste in the mouth.  Follow-up care is a key part of your treatment and safety. Be sure to make and go to all appointments, and call your doctor if you are having problems. It's also a good idea to know your test results and keep a list of the medicines you take.  Where can you learn more?  Go to https://www.bennett.info/ and enter G551 to learn more about "Learning About Vision Tests."  Current as of: June 6, 2023Content Version: 13.8   2006-2023 Healthwise, Incorporated.   Care instructions adapted under license by [facility]. If you have questions about a medical condition or this instruction, always ask your healthcare professional. [place] any warranty or liability for your use of this information.           Advance Directives: Care Instructions  Overview  An advance directive is a legal way to state your wishes at the end of  your life. It tells your family and your doctor what to do if you can't say what you want.  There are two main types of advance directives. You can change them any time your wishes change.  Living will.  This form tells your family and your doctor your wishes about life support and other treatment. The form is also called a declaration.  Medical power of attorney.  This form lets you name a person to make treatment decisions for you when you can't speak for yourself. This person is called a health care agent (health care proxy, health care surrogate). The form is also called a durable power of attorney for health  care.  If you do not have an advance directive, decisions about your medical care may be made by a family member, or by a doctor or a judge who doesn't know you.  It may help to think of an advance directive as a gift to the people who care for you. If you have one, they won't have to make tough decisions by themselves.  For more information, including forms for your state, see the [place] website (RebankingSpace.hu).  Follow-up care is a key part of your treatment and safety. Be sure to make and go to all appointments, and call your doctor if you are having problems. It's also a good idea to know your test results and keep a list of the medicines you take.  What should you include in an advance directive?  Many states have a unique advance directive form. (It may ask you to address specific issues.) Or you might use a universal form that's approved by many states.  If your form doesn't tell you what to address, it may be hard to know what to include in your advance directive. Use the questions below to help you get started.  Who do you want to make decisions about your medical care if you are not able to?  What life-support measures do you want if you have a serious illness that gets worse over time or can't be cured?  What are you most afraid of that might happen? (Maybe you're afraid of having pain, losing your independence, or being kept alive by machines.)  Where would you prefer to die? (Your home? A hospital? A nursing home?)  Do you want to donate your organs when you die?  Do you want certain religious practices performed before you die?  When should you call for help?  Be sure to contact your doctor if you have any questions.  Where can you learn more?  Go to https://www.bennett.info/ and enter R264 to learn more about "Advance Directives: Care Instructions."  Current as of: March 26, 2023Content Version: 13.8   2006-2023 Healthwise, Incorporated.    Care instructions adapted under license by [facility]. If you have questions about a medical condition or this instruction, always ask your healthcare professional. [place] any warranty or liability for your use of this information.           Starting a Weight Loss Plan: Care Instructions  Overview     If you're thinking about losing weight, it can be hard to know where to start. Your doctor can help you set up a weight loss plan that best meets your needs. You may want to take a class on nutrition or exercise, or you could join a weight loss support group. If you have questions about how to make changes to your eating or exercise habits, ask  your doctor about seeing a registered dietitian or an exercise specialist.  It can be a big challenge to lose weight. But you don't have to make huge changes at once. Make small changes, and stick with them. When those changes become habit, add a few more changes.  If you don't think you're ready to make changes right now, try to pick a date in the future. Make an appointment to see your doctor to discuss whether the time is right for you to start a plan.  Follow-up care is a key part of your treatment and safety. Be sure to make and go to all appointments, and call your doctor if you are having problems. It's also a good idea to know your test results and keep a list of the medicines you take.  How can you care for yourself at home?  Set realistic goals. Many people expect to lose much more weight than is likely. A weight loss of 5% to 10% of your body weight may be enough to improve your health.  Get family and friends involved to provide support. Talk to them about why you are trying to lose weight, and ask them to help. They can help by participating in exercise and having meals with you, even if they may be eating something different.  Find what works best for you. If you do not have time or do not like to cook, a program that offers meal  replacement bars or shakes may be better for you. Or if you like to prepare meals, finding a plan that includes daily menus and recipes may be best.  Ask your doctor about other health professionals who can help you achieve your weight loss goals.  A dietitian can help you make healthy changes in your diet.  An exercise specialist or personal trainer can help you develop a safe and effective exercise program.  A counselor or psychiatrist can help you cope with issues such as depression, anxiety, or family problems that can make it hard to focus on weight loss.  Consider joining a support group for people who are trying to lose weight. Your doctor can suggest groups in your area.  Where can you learn more?  Go to https://www.bennett.info/ and enter U357 to learn more about "Starting a Weight Loss Plan: Care Instructions."  Current as of: February 28, 2023Content Version: 13.8   2006-2023 Healthwise, Incorporated.   Care instructions adapted under license by [facility]. If you have questions about a medical condition or this instruction, always ask your healthcare professional. [place] any warranty or liability for your use of this information.           A Healthy Heart: Care Instructions  Your Care Instructions     Coronary artery disease, also called heart disease, occurs when a substance called plaque builds up in the vessels that supply oxygen-rich blood to your heart muscle. This can narrow the blood vessels and reduce blood flow. A heart attack happens when blood flow is completely blocked. A high-fat diet, smoking, and other factors increase the risk of heart disease.  Your doctor has found that you have a chance of having heart disease. You can do lots of things to keep your heart healthy. It may not be easy, but you can change your diet, exercise more, and quit smoking. These steps really work to lower your chance of heart disease.  Follow-up care is a  key part of your treatment and safety. Be sure to  make and go to all appointments, and call your doctor if you are having problems. It's also a good idea to know your test results and keep a list of the medicines you take.  How can you care for yourself at home?  Diet   Use less salt when you cook and eat. This helps lower your blood pressure. Taste food before salting. Add only a little salt when you think you need it. With time, your taste buds will adjust to less salt.    Eat fewer snack items, fast foods, canned soups, and other high-salt, high-fat, processed foods.    Read food labels and try to avoid saturated and trans fats. They increase your risk of heart disease by raising cholesterol levels.    Limit the amount of solid fat-butter, margarine, and shortening-you eat. Use olive, peanut, or canola oil when you cook. Bake, broil, and steam foods instead of frying them.    Eat a variety of fruit and vegetables every day. Dark green, deep orange, red, or yellow fruits and vegetables are especially good for you. Examples include spinach, carrots, peaches, and berries.    Foods high in fiber can reduce your cholesterol and provide important vitamins and minerals. High-fiber foods include whole-grain cereals and breads, oatmeal, beans, brown rice, citrus fruits, and apples.    Eat lean proteins. Heart-healthy proteins include seafood, lean meats and poultry, eggs, beans, peas, nuts, seeds, and soy products.    Limit drinks and foods with added sugar. These include candy, desserts, and soda pop.   Lifestyle changes   If your doctor recommends it, get more exercise. Walking is a good choice. Bit by bit, increase the amount you walk every day. Try for at least 30 minutes on most days of the week. You also may want to swim, bike, or do other activities.    Do not smoke. If you need help quitting, talk to your doctor about stop-smoking programs and medicines. These can increase your chances of quitting for  good. Quitting smoking may be the most important step you can take to protect your heart. It is never too late to quit.    Limit alcohol to 2 drinks a day for men and 1 drink a day for women. Too much alcohol can cause health problems.    Manage other health problems such as diabetes, high blood pressure, and high cholesterol. If you think you may have a problem with alcohol or drug use, talk to your doctor.   Medicines   Take your medicines exactly as prescribed. Call your doctor if you think you are having a problem with your medicine.    If your doctor recommends aspirin, take the amount directed each day. Make sure you take aspirin and not another kind of pain reliever, such as acetaminophen (Tylenol).   When should you call for help?   Call 911 if you have symptoms of a heart attack. These may include:   Chest pain or pressure, or a strange feeling in the chest.    Sweating.    Shortness of breath.    Pain, pressure, or a strange feeling in the back, neck, jaw, or upper belly or in one or both shoulders or arms.    Lightheadedness or sudden weakness.    A fast or irregular heartbeat.   After you call 911, the operator may tell you to chew 1 adult-strength or 2 to 4 low-dose aspirin. Wait for an ambulance. Do not try to drive yourself.  Watch closely for changes in your health, and be sure to contact your doctor if you have any problems.  Where can you learn more?  Go to https://www.bennett.info/ and enter F075 to learn more about "A Healthy Heart: Care Instructions."  Current as of: June 25, 2023Content Version: 13.8   2006-2023 Healthwise, Incorporated.   Care instructions adapted under license by [facility]. If you have questions about a medical condition or this instruction, always ask your healthcare professional. [place] any warranty or liability for your use of this information.      Personalized Preventive Plan for Sandra Harmon -  12/25/2021  Medicare offers a range of preventive health benefits. Some of the tests and screenings are paid in full while other may be subject to a deductible, co-insurance, and/or copay.    Some of these benefits include a comprehensive review of your medical history including lifestyle, illnesses that may run in your family, and various assessments and screenings as appropriate.    After reviewing your medical record and screening and assessments performed today your provider may have ordered immunizations, labs, imaging, and/or referrals for you.  A list of these orders (if applicable) as well as your Preventive Care list are included within your After Visit Summary for your review.    Other Preventive Recommendations:    A preventive eye exam performed by an eye specialist is recommended every 1-2 years to screen for glaucoma; cataracts, macular degeneration, and other eye disorders.  A preventive dental visit is recommended every 6 months.  Try to get at least 150 minutes of exercise per week or 10,000 steps per day on a pedometer .  Order or download the FREE "Exercise & Physical Activity: Your Everyday Guide" from [employer]. Call 225-242-9972 or search [employer] online.  You need 1200-1500 mg of calcium and 1000-2000 IU of vitamin D per day. It is possible to meet your calcium requirement with diet alone, but a vitamin D supplement is usually necessary to meet this goal.  When exposed to the sun, use a sunscreen that protects against both UVA and UVB radiation with an SPF of 30 or greater. Reapply every 2 to 3 hours or after sweating, drying off with a towel, or swimming.  Always wear a seat belt when traveling in a car. Always wear a helmet when riding a bicycle or motorcycle.

## 2021-12-25 NOTE — Other (Signed)
To pt. Chol=229, Trig=186, HDL=56, LDL=135.8. This has increased since 4 months ago. Is she willing to try another statin. I know Lipitor made her ache. Also, encourage a low cholesterol diet. Glucose=89. A1C=5.4. Vitamin D is wnl as well as TSH.   Thanks

## 2021-12-26 LAB — COMPREHENSIVE METABOLIC PANEL
ALT: 33 U/L (ref 12–65)
AST: 33 U/L (ref 15–37)
Albumin/Globulin Ratio: 1.3 (ref 0.4–1.6)
Albumin: 3.7 g/dL (ref 3.2–4.6)
Alk Phosphatase: 43 U/L — ABNORMAL LOW (ref 50–136)
Anion Gap: 5 mmol/L (ref 2–11)
BUN: 8 MG/DL (ref 8–23)
CO2: 22 mmol/L (ref 21–32)
Calcium: 8.9 MG/DL (ref 8.3–10.4)
Chloride: 115 mmol/L — ABNORMAL HIGH (ref 101–110)
Creatinine: 0.7 MG/DL (ref 0.6–1.0)
Est, Glom Filt Rate: >60 mL/min/{1.73_m2} (ref >60)
Globulin: 2.9 g/dL (ref 2.8–4.5)
Glucose: 89 mg/dL (ref 65–100)
Potassium: 4.2 mmol/L (ref 3.5–5.1)
Sodium: 142 mmol/L (ref 133–143)
Total Bilirubin: 0.3 MG/DL (ref 0.2–1.1)
Total Protein: 6.6 g/dL (ref 6.3–8.2)

## 2021-12-26 LAB — TSH: TSH, 3RD GENERATION: 3.01 u[IU]/mL (ref 0.358–3.740)

## 2021-12-26 LAB — LIPID PANEL
Chol/HDL Ratio: 4.1
Cholesterol, Total: 229 MG/DL — ABNORMAL HIGH (ref <200)
HDL: 56 MG/DL (ref 40–60)
LDL Calculated: 135.8 MG/DL — ABNORMAL HIGH (ref <100)
Triglycerides: 186 MG/DL — ABNORMAL HIGH (ref 35–150)
VLDL Cholesterol Calculated: 37.2 MG/DL — ABNORMAL HIGH (ref 6.0–23.0)

## 2021-12-26 LAB — VITAMIN D 25 HYDROXY: Vit D, 25-Hydroxy: 47.5 ng/mL (ref 30.0–100.0)

## 2021-12-28 MED ORDER — PRAVASTATIN SODIUM 20 MG PO TABS
20 MG | ORAL_TABLET | Freq: Every day | ORAL | 1 refills | Status: DC
Start: 2021-12-28 — End: 2022-07-03

## 2021-12-28 NOTE — Other (Signed)
Pt aware

## 2022-01-04 ENCOUNTER — Ambulatory Visit: Payer: PRIVATE HEALTH INSURANCE | Primary: Nurse Practitioner

## 2022-01-04 DIAGNOSIS — Z8249 Family history of ischemic heart disease and other diseases of the circulatory system: Secondary | ICD-10-CM

## 2022-01-04 NOTE — Other (Signed)
IMPRESSION:  No abdominal aortic aneurysm  Thanks

## 2022-01-28 NOTE — Telephone Encounter (Signed)
Location of patient: [place]    Received call from [facility] at [facility] with [employer].    Subjective: Caller states "Cough"     Current Symptoms:   Dry cough that is worsening  Pain under the right breast worse with coughing  SOB upon exertion  "A little bit" of wheezing  Patient was seen at [facility] on 11/25 and diagnosed with bronchitis.  She has been taking Doxycycline and will be finished today.  Has also been using an Albuterol inhaler for wheezing.    Onset: several weeks ago; worsening    Pain Severity: 7/10; sharp; waxing and waning    Temperature: Denies    Recommended disposition: See PCP within 3 Days    Care advice provided, patient verbalizes understanding; denies any other questions or concerns; instructed to call back for any new or worsening symptoms.    Patient/Caller agrees with recommended disposition; Probation officer provided warm transfer to [employer] [facility] at [facility] for appointment scheduling    Attention Provider:  Thank you for allowing me to participate in the care of your patient.  The patient was connected to triage in response to information provided to the ECC/PSC.  Please do not respond through this encounter as the response is not directed to a shared pool.    Reason for Disposition   Cough has been present for > 3 weeks    Protocols used: Cough-ADULT-[place]

## 2022-01-29 ENCOUNTER — Ambulatory Visit
Admit: 2022-01-29 | Discharge: 2022-01-29 | Payer: MEDICARE | Attending: Nurse Practitioner | Primary: Nurse Practitioner

## 2022-01-29 DIAGNOSIS — R059 Cough, unspecified: Secondary | ICD-10-CM

## 2022-01-29 MED ORDER — BUDESONIDE-FORMOTEROL FUMARATE 160-4.5 MCG/ACT IN AERO
Freq: Two times a day (BID) | RESPIRATORY_TRACT | 1 refills | Status: AC
Start: 2022-01-29 — End: 2022-12-27

## 2022-01-29 MED ORDER — MONTELUKAST SODIUM 10 MG PO TABS
10 MG | ORAL_TABLET | Freq: Every day | ORAL | 3 refills | Status: DC
Start: 2022-01-29 — End: 2022-07-03

## 2022-01-29 MED ORDER — ALBUTEROL SULFATE HFA 108 (90 BASE) MCG/ACT IN AERS
10890 (90 Base) MCG/ACT | Freq: Four times a day (QID) | RESPIRATORY_TRACT | 0 refills | Status: DC | PRN
Start: 2022-01-29 — End: 2022-07-03

## 2022-01-29 NOTE — Progress Notes (Signed)
[facility]  [address]  [place], [place] [address]   (ph) [phone] (fax) [phone]  Prescious Hurless L. Cothran-Pate APRN, FNP-C      Chief Complaint   Patient presents with    Cough     Dry cough       66 yo female comes in c/o a cough. Pt reports having Covid in October. She then went to Urgent Care in November and was diagnosed with Bronchitis and given Doxycycline and Medrol dose pak. Reports that she does not feel better. Denies fever.     Cough        Allergies   Allergen Reactions    Atorvastatin Other (See Comments)     Muscle pain    Hydrocodone-Acetaminophen Itching    Penicillins Hives       Past Medical History:   Diagnosis Date    Arthritis     Chronic back pain     Hypertension     Hyperthyroidism     Thyroid disease     hypothyroid       Family History   Problem Relation Age of Onset    Asthma Mother     Lung Disease Mother     High Blood Pressure Mother     Liver Disease Father     Alcohol Abuse Father     Early Death Sister     Stroke Brother     Diabetes Brother     Alcohol Abuse Brother     Early Death Brother     Heart Attack Brother     High Blood Pressure Brother     Substance Abuse Brother     Alcohol Abuse Brother     Diabetes Brother     High Blood Pressure Brother     Substance Abuse Brother     Breast Cancer Neg Hx        Social History     Socioeconomic History    Marital status: Divorced     Spouse name: Not on file    Number of children: Not on file    Years of education: Not on file    Highest education level: Not on file   Occupational History    Not on file   Tobacco Use    Smoking status: Former     Packs/day: 1.00     Years: 15.00     Additional pack years: 0.00     Total pack years: 15.00     Types: Cigarettes     Start date: 32     Quit date: 10/25/1996     Years since quitting: 25.2     Passive exposure: Never    Smokeless tobacco: Former   Substance and Sexual Activity    Alcohol use: No    Drug use: No    Sexual activity: Not Currently      Partners: Male   Other Topics Concern    Not on file   Social History Narrative    Not on file     Social Determinants of Health     Financial Resource Strain: Low Risk  (09/13/2020)    Overall Financial Resource Strain (CARDIA)     Difficulty of Paying Living Expenses: Not very hard   Food Insecurity: Not on file (04/03/2021)   Transportation Needs: No Transportation Needs (09/13/2020)    PRAPARE - Transportation     Lack of Transportation (Medical): No     Lack of  Transportation (Non-Medical): No   Physical Activity: Unknown (12/22/2021)    Exercise Vital Sign     Days of Exercise per Week: 0 days     Minutes of Exercise per Session: Not on file   Stress: Not on file   Social Connections: Not on file   Intimate Partner Violence: Not on file   Housing Stability: Not on file       OB History    No obstetric history on file.         Past Surgical History:   Procedure Laterality Date    APPENDECTOMY      CERVICAL DISCECTOMY  09/16/06    Anterior C5-C6 diskectomy with decompression;  anterior cervical arthrodesis interbody technique;  anterior cervical instrumentation;  tricortical iliac crest bone graft harvested through a separate incision.     CHOLECYSTECTOMY      TAH AND BSO (CERVIX REMOVED)      TUBAL LIGATION         Health Maintenance   Topic Date Due    HIV screen  Never done    Respiratory Syncytial Virus (RSV) age 4 yrs+ (1 - 46-dose 60+ series) Never done    COVID-19 Vaccine (5 - 2023-24 season) 10/26/2021    Pneumococcal 65+ years Vaccine (1 - PCV) Never done    DTaP/Tdap/Td vaccine (2 - Td or Tdap) 09/26/2022    Depression Screen  12/23/2022    Lipids  12/26/2022    Annual Wellness Visit (AWV)  12/26/2022    Breast cancer screen  09/05/2023    Colorectal Cancer Screen  11/27/2023    DEXA (modify frequency per FRAX score)  Completed    Flu vaccine  Completed    Shingles vaccine  Completed    Hepatitis C screen  Completed    Hepatitis A vaccine  Aged Out    Hepatitis B vaccine  Aged Out    Hib vaccine  Aged  Out    Meningococcal (ACWY) vaccine  Aged Out    Diabetes screen  Discontinued    Cervical cancer screen  Discontinued         Current Outpatient Medications:     pravastatin (PRAVACHOL) 20 MG tablet, Take 1 tablet by mouth daily, Disp: 90 tablet, Rfl: 1    etanercept (ENBREL) 50 MG/ML injection, Inject 0.5 mLs into the skin once, Disp: , Rfl:     cyclobenzaprine (FLEXERIL) 10 MG tablet, Take 1 tablet by mouth daily, Disp: 30 tablet, Rfl: 3    levothyroxine (SYNTHROID) 75 MCG tablet, Take 1 tablet by mouth every morning (before breakfast), Disp: 90 tablet, Rfl: 1    sertraline (ZOLOFT) 50 MG tablet, Take 1 tablet by mouth daily, Disp: 90 tablet, Rfl: 1    lisinopril (PRINIVIL;ZESTRIL) 10 MG tablet, Take 1 tablet by mouth daily, Disp: 90 tablet, Rfl: 1    metoprolol succinate (TOPROL XL) 50 MG extended release tablet, Take 1 tablet by mouth 2 times daily, Disp: 180 tablet, Rfl: 1    alendronate (FOSAMAX) 70 MG tablet, , Disp: , Rfl:     vitamin D (ERGOCALCIFEROL) 1.25 MG (50000 UT) CAPS capsule, , Disp: , Rfl:     aspirin 81 MG chewable tablet, Take by mouth daily, Disp: , Rfl:     meloxicam (MOBIC) 15 MG tablet, Take 1 tablet by mouth daily, Disp: 30 tablet, Rfl: 0    Review of Systems   Constitutional: Negative.    HENT: Negative.     Eyes: Negative.  Respiratory:  Positive for cough.    Cardiovascular: Negative.    Gastrointestinal: Negative.    Endocrine: Negative.    Genitourinary: Negative.    Musculoskeletal: Negative.    Skin: Negative.    Allergic/Immunologic: Negative.    Neurological: Negative.    Hematological: Negative.    Psychiatric/Behavioral: Negative.          Vitals:    01/29/22 1054   BP: 115/75   Pulse: 86   Temp: 98 F (36.7 C)   SpO2: 96%        Physical Exam  Constitutional:       Appearance: Normal appearance.   HENT:      Head: Normocephalic.      Nose: Nose normal.   Eyes:      Extraocular Movements: Extraocular movements intact.      Pupils: Pupils are equal, round, and reactive to  light.   Cardiovascular:      Rate and Rhythm: Normal rate and regular rhythm.   Pulmonary:      Effort: Pulmonary effort is normal.      Breath sounds: Normal breath sounds.   Abdominal:      General: Abdomen is flat.      Palpations: Abdomen is soft.   Musculoskeletal:         General: Normal range of motion.      Cervical back: Normal range of motion and neck supple.   Skin:     General: Skin is warm and dry.   Neurological:      General: No focal deficit present.      Mental Status: She is alert and oriented to person, place, and time.   Psychiatric:         Mood and Affect: Mood normal.         Behavior: Behavior normal.                01/29/2022    11:01 AM 12/22/2021     7:38 PM 08/14/2021     9:38 PM 04/04/2021     4:02 PM 03/23/2021     3:34 PM 01/09/2021     3:47 PM 09/13/2020     3:27 PM   PHQ Scores   PHQ2 Score 0 0 0 0 0 0 0   PHQ9 Score 0 0 0 0 0 0 0        Assessment & Plan:  1. Cough, unspecified type    - budesonide-formoterol (SYMBICORT) 160-4.5 MCG/ACT AERO; Inhale 2 puffs into the lungs 2 times daily  Dispense: 30.6 g; Refill: 1  - albuterol sulfate HFA (VENTOLIN HFA) 108 (90 Base) MCG/ACT inhaler; Inhale 2 puffs into the lungs 4 times daily as needed for Wheezing  Dispense: 18 g; Refill: 0  - montelukast (SINGULAIR) 10 MG tablet; Take 1 tablet by mouth daily  Dispense: 30 tablet; Refill: 3    2. SOB (shortness of breath)    - budesonide-formoterol (SYMBICORT) 160-4.5 MCG/ACT AERO; Inhale 2 puffs into the lungs 2 times daily  Dispense: 30.6 g; Refill: 1  - albuterol sulfate HFA (VENTOLIN HFA) 108 (90 Base) MCG/ACT inhaler; Inhale 2 puffs into the lungs 4 times daily as needed for Wheezing  Dispense: 18 g; Refill: 0  - montelukast (SINGULAIR) 10 MG tablet; Take 1 tablet by mouth daily  Dispense: 30 tablet; Refill: 3          Greater than 50% counseling and/or coordination of care: the treatment regimen is extensive; detailed review.  This note was dictated using dragon voice recognition software.  It  has been proofread, but there may still exist voice recognition errors that the author did not detect.      Signed By: Erskine Squibb, APRN - CNP     January 29, 2022

## 2022-01-30 NOTE — Telephone Encounter (Signed)
Pt called wanting to have a cheaper rx other than Symbicort, states it's too expensive.  Please call pt at [phone], thank you!

## 2022-02-05 NOTE — Telephone Encounter (Signed)
-----   Message from Anette Guarneri sent at 02/05/2022 12:24 PM EST -----  Subject: Message to Provider    QUESTIONS  Information for Provider? pt is trying to get symbicort from drug store   but ins will not cover anc cost is too high for pt-pt will ck with ins and   pharmacy to get a preferred drug   ---------------------------------------------------------------------------  --------------  [place]  [phone]; OK to leave message on voicemail  ---------------------------------------------------------------------------  --------------  SCRIPT ANSWERS  Relationship to Patient? Self

## 2022-02-05 NOTE — Telephone Encounter (Signed)
FYI

## 2022-02-06 NOTE — Telephone Encounter (Signed)
Please send in another inhaler for pt.

## 2022-02-06 NOTE — Telephone Encounter (Signed)
From: Larinda Buttery  Sent: 02/06/2022 10:29 AM EST  To: [facility] Clinical Staff  Subject: Symbicort inhaler.    The insurance said they would not cover this medication because the notes don't match what the medication is used for.

## 2022-02-06 NOTE — Telephone Encounter (Signed)
Done

## 2022-03-25 NOTE — Telephone Encounter (Signed)
Has been experiencing vertigo since Saturday, 1/27 @ 3am. She is able to get up now and walk around now, but is still dizzy. No known cause, acute dizziness. I told her to check her blood pressure, and if it is too low, she needs to call back.

## 2022-03-28 ENCOUNTER — Encounter

## 2022-03-28 ENCOUNTER — Inpatient Hospital Stay: Admit: 2022-03-28 | Payer: MEDICARE | Primary: Nurse Practitioner

## 2022-03-28 ENCOUNTER — Ambulatory Visit
Admit: 2022-03-28 | Discharge: 2022-03-28 | Payer: MEDICARE | Attending: Nurse Practitioner | Primary: Nurse Practitioner

## 2022-03-28 DIAGNOSIS — R2681 Unsteadiness on feet: Secondary | ICD-10-CM

## 2022-03-28 LAB — CBC WITH AUTO DIFFERENTIAL
Absolute Immature Granulocyte: 0 10*3/uL (ref 0.0–0.5)
Basophils %: 1 % (ref 0.0–2.0)
Basophils Absolute: 0.1 10*3/uL (ref 0.0–0.2)
Eosinophils %: 4 % (ref 0.5–7.8)
Eosinophils Absolute: 0.2 10*3/uL (ref 0.0–0.8)
Hematocrit: 45.6 % (ref 35.8–46.3)
Hemoglobin: 14.8 g/dL (ref 11.7–15.4)
Immature Granulocytes: 0 % (ref 0.0–5.0)
Lymphocytes %: 39 % (ref 13–44)
Lymphocytes Absolute: 2 10*3/uL (ref 0.5–4.6)
MCH: 30.9 PG (ref 26.1–32.9)
MCHC: 32.5 g/dL (ref 31.4–35.0)
MCV: 95.2 FL (ref 82–102)
MPV: 10.5 FL (ref 9.4–12.3)
Monocytes %: 9 % (ref 4.0–12.0)
Monocytes Absolute: 0.5 10*3/uL (ref 0.1–1.3)
Neutrophils %: 47 % (ref 43–78)
Neutrophils Absolute: 2.5 10*3/uL (ref 1.7–8.2)
Platelets: 205 10*3/uL (ref 150–450)
RBC: 4.79 M/uL (ref 4.05–5.2)
RDW: 13.2 % (ref 11.9–14.6)
WBC: 5.2 10*3/uL (ref 4.3–11.1)
nRBC: 0 10*3/uL (ref 0.0–0.2)

## 2022-03-28 LAB — COMPREHENSIVE METABOLIC PANEL
ALT: 41 U/L (ref 12–65)
AST: 39 U/L — ABNORMAL HIGH (ref 15–37)
Albumin/Globulin Ratio: 1.3 (ref 0.4–1.6)
Albumin: 3.8 g/dL (ref 3.2–4.6)
Alk Phosphatase: 55 U/L (ref 50–136)
Anion Gap: 4 mmol/L (ref 2–11)
BUN: 9 MG/DL (ref 8–23)
CO2: 28 mmol/L (ref 21–32)
Calcium: 8.8 MG/DL (ref 8.3–10.4)
Chloride: 112 mmol/L (ref 103–113)
Creatinine: 0.6 MG/DL (ref 0.6–1.0)
Est, Glom Filt Rate: >60 mL/min/{1.73_m2} (ref >60)
Globulin: 2.9 g/dL (ref 2.8–4.5)
Glucose: 99 mg/dL (ref 65–100)
Potassium: 3.8 mmol/L (ref 3.5–5.1)
Sodium: 144 mmol/L (ref 136–146)
Total Bilirubin: 0.7 MG/DL (ref 0.2–1.1)
Total Protein: 6.7 g/dL (ref 6.3–8.2)

## 2022-03-28 LAB — LIPID PANEL
Chol/HDL Ratio: 2.8
Cholesterol, Total: 174 MG/DL (ref <200)
HDL: 63 MG/DL — ABNORMAL HIGH (ref 40–60)
LDL Calculated: 83.6 MG/DL (ref <100)
Triglycerides: 137 MG/DL (ref 35–150)
VLDL Cholesterol Calculated: 27.4 MG/DL — ABNORMAL HIGH (ref 6.0–23.0)

## 2022-03-28 LAB — CK: Total CK: 59 U/L (ref 21–215)

## 2022-03-28 LAB — TSH: TSH, 3RD GENERATION: 2.16 u[IU]/mL (ref 0.358–3.740)

## 2022-03-28 LAB — VITAMIN D 25 HYDROXY: Vit D, 25-Hydroxy: 45.9 ng/mL (ref 30.0–100.0)

## 2022-03-28 LAB — AMB POC HEMOGLOBIN A1C: Hemoglobin A1C, POC: 5.3 %

## 2022-03-28 MED ORDER — MECLIZINE HCL 25 MG PO TABS
25 MG | ORAL_TABLET | Freq: Three times a day (TID) | ORAL | 0 refills | Status: DC | PRN
Start: 2022-03-28 — End: 2023-05-27

## 2022-03-28 NOTE — Other (Signed)
Results to patient please.  Total cholesterol is 174, triglycerides 137, HDL 63, and LDL 83.6.  Electrolytes are within normal limits.  Glucose is 99.  One of her liver enzymes is slightly elevated.  Can we add a hepatitis panel to recent labs?  Vitamin D and TSH are normal.  Thanks.

## 2022-03-28 NOTE — Other (Signed)
Report to pt.   IMPRESSION:  No CT evidence of acute intracranial abnormality.  She wants to try Meclizine for vertigo. Script sent to pharmacy.  Thanks

## 2022-03-28 NOTE — Progress Notes (Signed)
[facility]  [address]  [place] [address]   (ph) [phone] (fax) [phone]  Deland Slocumb L. Cothran-Pate APRN, FNP-C      Chief Complaint   Patient presents with    Follow-up     Pt presents today for FU. Pt had dizziness spell over the weekend and has not been right since.          67 yo female presents today for FU. Pt had dizziness spell over the weekend and has not been right since. She reports an unsteady gait, lingering headache, and dizziness. Denies a hx of vertigo. She also reports that she deviates to the right when walking. Sister died of an aneurysm years ago. Pt had an MRI in 04/22 that was normal. Has a hx of tachycardia with dizziness occasionally surrounding this. Had a complete cardiac work-up in 2022 for tachycardia and currently taking metoprolol bid. Reports when she is anxious that heart rate increases.           Allergies   Allergen Reactions    Atorvastatin Other (See Comments)     Muscle pain    Hydrocodone-Acetaminophen Itching    Penicillins Hives       Past Medical History:   Diagnosis Date    Arthritis     Chronic back pain     Hypertension     Hyperthyroidism     Thyroid disease     hypothyroid       Family History   Problem Relation Age of Onset    Asthma Mother     Lung Disease Mother     High Blood Pressure Mother     Liver Disease Father     Alcohol Abuse Father     Early Death Sister     Stroke Brother     Diabetes Brother     Alcohol Abuse Brother     Early Death Brother     Heart Attack Brother     High Blood Pressure Brother     Substance Abuse Brother     Alcohol Abuse Brother     Diabetes Brother     High Blood Pressure Brother     Substance Abuse Brother     Breast Cancer Neg Hx        Social History     Socioeconomic History    Marital status: Divorced     Spouse name: Not on file    Number of children: Not on file    Years of education: Not on file    Highest education level: Not on file   Occupational History    Not on file    Tobacco Use    Smoking status: Former     Current packs/day: 0.00     Average packs/day: 1 pack/day for 17.7 years (17.7 ttl pk-yrs)     Types: Cigarettes     Start date: 60     Quit date: 10/25/1996     Years since quitting: 25.4     Passive exposure: Never    Smokeless tobacco: Former   Substance and Sexual Activity    Alcohol use: No    Drug use: No    Sexual activity: Not Currently     Partners: Male   Other Topics Concern    Not on file   Social History Narrative    Not on file     Social Determinants of Health     Financial Resource Strain:  Low Risk  (09/13/2020)    Overall Financial Resource Strain (CARDIA)     Difficulty of Paying Living Expenses: Not very hard   Food Insecurity: Not on file (04/03/2021)   Transportation Needs: No Transportation Needs (09/13/2020)    PRAPARE - Armed forces logistics/support/administrative officer (Medical): No     Lack of Transportation (Non-Medical): No   Physical Activity: Unknown (12/22/2021)    Exercise Vital Sign     Days of Exercise per Week: 0 days     Minutes of Exercise per Session: Not on file   Stress: Not on file   Social Connections: Not on file   Intimate Partner Violence: Not on file   Housing Stability: Not on file       OB History    No obstetric history on file.         Past Surgical History:   Procedure Laterality Date    APPENDECTOMY      CERVICAL DISCECTOMY  09/16/06    Anterior C5-C6 diskectomy with decompression;  anterior cervical arthrodesis interbody technique;  anterior cervical instrumentation;  tricortical iliac crest bone graft harvested through a separate incision.     CHOLECYSTECTOMY      TAH AND BSO (CERVIX REMOVED)      TUBAL LIGATION         Health Maintenance   Topic Date Due    Pneumococcal 65+ years Vaccine (1 - PCV) Never done    HIV screen  Never done    Respiratory Syncytial Virus (RSV) Pregnant or age 62 yrs+ (34 - 1-dose 60+ series) Never done    COVID-19 Vaccine (5 - 2023-24 season) 10/26/2021    Annual Wellness Visit (Medicare Advantage)   02/25/2022    DTaP/Tdap/Td vaccine (2 - Td or Tdap) 09/26/2022    Lipids  12/26/2022    Depression Screen  03/29/2023    Breast cancer screen  09/05/2023    Colorectal Cancer Screen  11/27/2023    DEXA (modify frequency per FRAX score)  Completed    Flu vaccine  Completed    Shingles vaccine  Completed    Hepatitis C screen  Completed    Hepatitis A vaccine  Aged Out    Hepatitis B vaccine  Aged Out    Hib vaccine  Aged Out    Polio vaccine  Aged Out    Meningococcal (ACWY) vaccine  Aged Out    Diabetes screen  Discontinued    Cervical cancer screen  Discontinued         Current Outpatient Medications:     budesonide-formoterol (SYMBICORT) 160-4.5 MCG/ACT AERO, Inhale 2 puffs into the lungs 2 times daily, Disp: 30.6 g, Rfl: 1    albuterol sulfate HFA (VENTOLIN HFA) 108 (90 Base) MCG/ACT inhaler, Inhale 2 puffs into the lungs 4 times daily as needed for Wheezing, Disp: 18 g, Rfl: 0    montelukast (SINGULAIR) 10 MG tablet, Take 1 tablet by mouth daily, Disp: 30 tablet, Rfl: 3    pravastatin (PRAVACHOL) 20 MG tablet, Take 1 tablet by mouth daily, Disp: 90 tablet, Rfl: 1    cyclobenzaprine (FLEXERIL) 10 MG tablet, Take 1 tablet by mouth daily, Disp: 30 tablet, Rfl: 3    levothyroxine (SYNTHROID) 75 MCG tablet, Take 1 tablet by mouth every morning (before breakfast), Disp: 90 tablet, Rfl: 1    sertraline (ZOLOFT) 50 MG tablet, Take 1 tablet by mouth daily, Disp: 90 tablet, Rfl: 1    lisinopril (PRINIVIL;ZESTRIL) 10 MG tablet,  Take 1 tablet by mouth daily, Disp: 90 tablet, Rfl: 1    metoprolol succinate (TOPROL XL) 50 MG extended release tablet, Take 1 tablet by mouth 2 times daily, Disp: 180 tablet, Rfl: 1    alendronate (FOSAMAX) 70 MG tablet, , Disp: , Rfl:     aspirin 81 MG chewable tablet, Take by mouth daily, Disp: , Rfl:     etanercept (ENBREL) 50 MG/ML injection, Inject 0.5 mLs into the skin once (Patient not taking: Reported on 03/28/2022), Disp: , Rfl:     meloxicam (MOBIC) 15 MG tablet, Take 1 tablet by mouth  daily, Disp: 30 tablet, Rfl: 0    Review of Systems   Constitutional: Negative.    HENT: Negative.     Eyes: Negative.    Respiratory: Negative.     Cardiovascular:  Positive for palpitations (feels these may be related to anxiety).   Gastrointestinal: Negative.    Endocrine: Negative.    Genitourinary: Negative.    Musculoskeletal: Negative.    Skin: Negative.    Allergic/Immunologic: Negative.    Neurological: Negative.    Hematological: Negative.    Psychiatric/Behavioral: Negative.          Vitals:    03/28/22 0822   BP: (!) 138/92   Pulse: (!) 104   Temp:    SpO2: 96%        Physical Exam  Constitutional:       Appearance: Normal appearance.   HENT:      Head: Normocephalic.      Nose: Nose normal.   Eyes:      Extraocular Movements: Extraocular movements intact.      Pupils: Pupils are equal, round, and reactive to light.   Cardiovascular:      Rate and Rhythm: Normal rate and regular rhythm.   Pulmonary:      Effort: Pulmonary effort is normal.      Breath sounds: Normal breath sounds.   Abdominal:      General: Abdomen is flat.      Palpations: Abdomen is soft.   Musculoskeletal:         General: Normal range of motion.      Cervical back: Normal range of motion and neck supple.   Skin:     General: Skin is warm and dry.   Neurological:      General: No focal deficit present.      Mental Status: She is alert and oriented to person, place, and time.   Psychiatric:         Mood and Affect: Mood normal.         Behavior: Behavior normal.                03/28/2022     8:01 AM 01/29/2022    11:01 AM 12/22/2021     7:38 PM 08/14/2021     9:38 PM 04/04/2021     4:02 PM 03/23/2021     3:34 PM 01/09/2021     3:47 PM   PHQ Scores   PHQ2 Score 0 0 0 0 0 0 0   PHQ9 Score 0 0 0 0 0 0 0      Sinus Rhythm   -Old anteroseptal infarct.  -Nonspecific T-abnormality.    ABNORMAL  Rate=89, PR=158, QT=381    Assessment & Plan:    1. Unsteady gait    - CT HEAD W WO CONTRAST; Future    2. Dizziness  - CT HEAD W WO CONTRAST;  Future    3.  Elevated glucose  - AMB POC HEMOGLOBIN A1C  A1C=5.3    4. Mixed hyperlipidemia  stable  - Lipid Panel; Future  - CK; Future  - Comprehensive Metabolic Panel; Future  - CBC with Auto Differential; Future    5. Vitamin D deficiency  - Vitamin D 25 Hydroxy; Future    6. Hypothyroidism, unspecified type  - TSH; Future    7. Nonintractable headache, unspecified chronicity pattern, unspecified headache type  - CT HEAD W WO CONTRAST; Future    8. Palpitations  - EKG 12 Lead  Full cardiac work-up was in 2022. Pt is still under Dr. Octavio Manns but has not been there in a while. Will reach out for a follow-up.        Greater than 50% counseling and/or coordination of care: the treatment regimen is extensive; detailed review. Will notify of labs/CT. Recheck in 3 months. This note was dictated using dragon voice recognition software.  It has been proofread, but there may still exist voice recognition errors that the author did not detect.      Signed By: Dorthula Matas, APRN - CNP     March 28, 2022

## 2022-03-29 ENCOUNTER — Encounter

## 2022-03-29 LAB — HEPATITIS PANEL, ACUTE
Hep A IgM: NONREACTIVE
Hep B Core Ab, IgM: NONREACTIVE
Hepatitis B Surface Ag: NONREACTIVE
Hepatitis C Ab: NONREACTIVE

## 2022-04-19 ENCOUNTER — Encounter

## 2022-04-22 ENCOUNTER — Ambulatory Visit
Admit: 2022-04-22 | Discharge: 2022-04-22 | Payer: MEDICARE | Attending: Critical Care Medicine | Primary: Nurse Practitioner

## 2022-04-22 ENCOUNTER — Inpatient Hospital Stay: Admit: 2022-04-22 | Payer: MEDICARE | Primary: Nurse Practitioner

## 2022-04-22 DIAGNOSIS — R0602 Shortness of breath: Secondary | ICD-10-CM

## 2022-04-22 DIAGNOSIS — R053 Chronic cough: Secondary | ICD-10-CM

## 2022-04-22 LAB — SPIROMETRY WITHOUT BRONCHODILATOR
FEV1 %Pred-Pre: 64 %
FEV1 Pred: 1.98 L
FEV1/FVC: 86 %
FEV1: 1.26 L
FVC %Pred-Pre: 59 %
FVC Pred: 2.5 L
FVC: 1.47 L

## 2022-04-22 NOTE — Progress Notes (Signed)
Name:  Sandra Harmon  Date of Birth:  16-Dec-1955   MRN: [id number]      Office Visit: 04/22/2022        ASSESSMENT AND PLAN:  (Medical Decision Making)    Impression: 67 y.o. female with ankylosing spondylitis, obesity, hypertension, hypothyroidism with worsening shortness of breath and cough post COVID 4 months ago.    1. Shortness of breath  Unclear etiology.  Her exam is unremarkable and I think her chest x-ray is unchanged over the past 12 years.  Could certainly entertain the possibility of post-COVID syndrome, but there is no test for this and no treatment either so will further evaluate with complete PFTs and a CT scan given her history of ankylosing spondylitis versus other arthritis, restrictive spirometry and question of interstitial changes on CT scan.  - Spirometry Without Bronchodilator  - CT CHEST HIGH RESOLUTION; Future    2. Post covid-19 condition, unspecified  As above.  - CT CHEST HIGH RESOLUTION; Future    3. Abnormal chest x-ray  Interstitial changes though I do not think there is actually any difference over the past 12 years.  - CT CHEST HIGH RESOLUTION; Future    No orders of the defined types were placed in this encounter.    No orders of the defined types were placed in this encounter.    Follow-up and Dispositions    Return in about 3 months (around 07/21/2022) for CPFTs next available, Dr. Jerrel Ivory or NP Coutu.       Harrel Lemon, MD    No specialty comments available.  _________________________________________________________________________    HISTORY OF PRESENT ILLNESS:    Ms. Sandra Harmon is a 67 y.o. female who is seen at [facility] today for  Cough and New Patient  She is a 67 year old female with no history of pulmonary disease who presents after having COVID back in October 2023.  She did go to the ED and was given Paxlovid and recovered from the acute symptoms, but had persistent shortness of breath, difficulty taking a deep breath and and cough.  She has noticed frequent  cough worse at night nonproductive.  She also had another recurrent illness a couple weeks ago and treated for bronchitis.  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  She has noticed some wheezing may be worse with walking upstairs though she does not do any specific exercise.  She has noted some chest tightness associated with coughing.  She quit smoking in 1996.  Her mom had COPD and emphysema and smoked heavily.  She is not currently on any therapy for ankylosing spondylitis, but has taken Enbrel in the past and is trying to get recertified for it again.  She denies any fevers, chills, night sweats or weight loss.    REVIEW OF SYSTEMS: 10 point review of systems is negative except as reported in HPI.    PHYSICAL EXAM: Body mass index is 35.75 kg/m.  Vitals:    04/22/22 0810   BP: 130/64   Pulse: (!) 112   Resp: 18   Temp: 98.1 F (36.7 C)   SpO2: 98%   Weight: 80.3 kg (177 lb)   Height: 1.499 m (4' 11"$ )         General:   Alert, cooperative, no distress, appears stated age.        Eyes:   Conjunctivae/corneas clear. PERRL        Mouth/Throat:  Lips, mucosa, and tongue normal. Teeth and gums normal.  Lungs:   Clear bilaterally     Heart:   Regular rate and rhythm, S1, S2 normal, no murmur, click, rub or gallop.     Abdomen:    Soft, non-tender.     Extremities:  Extremities normal, atraumatic, no cyanosis or edema.     Skin:  Skin color normal. No rashes or lesions     Neurologic:  A&Ox3     DIAGNOSTIC TESTS:                                                                                    LABS:   Lab Results   Component Value Date/Time    WBC 5.2 03/28/2022 08:42 AM    HGB 14.8 03/28/2022 08:42 AM    HCT 45.6 03/28/2022 08:42 AM    PLT 205 03/28/2022 08:42 AM    TSH 2.670 05/23/2020 04:33 PM     Imaging: I performed an independent interpretation of the patient's images.  CXR: clear chest, no change compared to 2012.    XR CHEST STANDARD TWO VW 04/22/2022    Narrative  CHEST TWO  VIEWS    Indication: Chronic cough and shortness of breath.    Reference: May 31, 2011.    FINDINGS: Chronic interstitial changes are noted. No consolidation. No  pneumothorax or effusion.  The heart is normal in size.  No acute bony  abnormalities are noted.    Impression  No acute cardiopulmonary process.    Chronic interstitial changes may reflect COPD in the appropriate clinical  setting.    CT Chest:   CT HEAD WO CONTRAST 03/28/2022    Narrative  Head CT    INDICATION: Dizziness    TECHNIQUE: Multiple 2D axial images obtained through the brain without  intravenous contrast.  Radiation dose reduction techniques were used for this  study:  All CT scans performed at this facility use one or all of the following:  Automated exposure control, adjustment of the mA and/or kVp according to  patient's size, iterative reconstruction.    COMPARISON: MRI 06/01/2020    FINDINGS: No areas of abnormal attenuation are seen in the brain. There is no CT  evidence of acute hemorrhage or infarction. The ventricles are normal in size.  There are no extra-axial fluid collections. No masses are seen. The sinuses are  clear. Nasal septal deviation to the right. There are no bony lesions.    Impression  No CT evidence of acute intracranial abnormality.    Nuclear Medicine: No results found for this or any previous visit from the past 3650 days.    PFTs: Mild restrictive pattern, different effort with FEV1 in 70s.       Latest Ref Rng & Units 04/22/2022     8:21 AM   Office Spirometry Results   FVC L 1.47    FEV1 L 1.26    FEV1 %Pred-Pre % 64    FVC %Pred-Pre % 59    FEV1/FVC % 86      No results found for this or any previous visit. No results found for this or any previous visit.    FeNO: No results found for this or any previous  visit.  FeNO and Likelihood of Eosinophilic Asthma   Unlikely Intermediate Likely   <25 ppb 25-50 ppb >50ppb     Exercise Oximetry:    Echo:   TRANSTHORACIC ECHOCARDIOGRAM (TTE) COMPLETE (CONTRAST/BUBBLE/3D  PRN) 01/10/2021    Interpretation Summary    Left Ventricle: Hyperdynamic left ventricular systolic function with a visually estimated EF of 70 - 75%. Left ventricle size is normal. Mildly increased wall thickness. Findings consistent with mild concentric hypertrophy. Normal wall motion. Normal diastolic function for age. Average E/e' ratio is 7.36.    PMH Reference Info:                                                                                                                Immunization History   Administered Date(s) Administered    COVID-19, MODERNA BLUE border, Primary or Immunocompromised, (age 8562y+), IM, 100 mcg/0.56m 12/17/2019    COVID-19, PFIZER PURPLE top, DILUTE for use, (age 85668y+), 319m/0.3mL 05/05/2019, 05/26/2019, 12/17/2019    Influenza Virus Vaccine 11/30/2018, 11/23/2019    Influenza, FLUAD, (age 66+), Adjuvanted, 0.2m59m0/31/2023    Influenza, FLUARIX, FLULAVAL, FLUZONE (age 85 m37+) AND AFLURIA, (age 466 y41), PF, 0.2mL40m/28/2021    Influenza, FLUCELVAX, (age 85 mo61), MDCK, PF, 0.2mL 40m07/2020, 03/04/2018    TDaP, ADACEL (age 8560y-669y-64yOSTRIX (age 8560y+), IM, 0.2mL 069m1/2014    Zoster Recombinant (Shingrix) 09/16/2018, 11/17/2018     Past Medical History:   Diagnosis Date    Arthritis     Chronic back pain     Hypertension     Hyperthyroidism     Thyroid disease     hypothyroid        Tobacco Use      Smoking status: Former        Packs/day: 0.00        Years: 1 pack/day for 17.7 years (17.7 ttl pk-yrs)        Types: Cigarettes        Start date: 1981  43  Quit date: 10/25/1996        Years since quitting: 25.5        Passive exposure: Never      Smokeless tobacco: Former    Allergies   Allergen Reactions    Atorvastatin Other (See Comments)     Muscle pain    Hydrocodone-Acetaminophen Itching    Penicillins Hives     Current Outpatient Medications   Medication Instructions    albuterol sulfate HFA (VENTOLIN HFA) 108 (90 Base) MCG/ACT inhaler 2 puffs, Inhalation, 4 TIMES DAILY PRN     alendronate (FOSAMAX) 70 MG tablet No dose, route, or frequency recorded.    aspirin 81 MG chewable tablet Oral, DAILY    budesonide-formoterol (SYMBICORT) 160-4.5 MCG/ACT AERO 2 puffs, Inhalation, 2 TIMES DAILY    cyclobenzaprine (FLEXERIL) 10 mg, Oral, DAILY    etanercept (ENBREL) 25 mg, SubCUTAneous, ONCE    levothyroxine (SYNTHROID) 75 mcg, Oral, DAILY BEFORE BREAKFAST    lisinopril (PRINIVIL;ZESTRIL) 10 mg,  Oral, DAILY    meloxicam (MOBIC) 15 mg, Oral, DAILY    metoprolol succinate (TOPROL XL) 50 mg, Oral, 2 TIMES DAILY    montelukast (SINGULAIR) 10 mg, Oral, DAILY    Multiple Vitamins-Minerals (CULTURELLE PROBIOTICS + MULTIV) CHEW Oral    pravastatin (PRAVACHOL) 20 mg, Oral, DAILY    sertraline (ZOLOFT) 50 mg, Oral, DAILY    Vitamin D3 2,000 Units, Oral, DAILY

## 2022-05-01 ENCOUNTER — Ambulatory Visit: Payer: MEDICARE | Primary: Nurse Practitioner

## 2022-05-01 DIAGNOSIS — R0602 Shortness of breath: Secondary | ICD-10-CM

## 2022-05-03 ENCOUNTER — Encounter

## 2022-05-03 ENCOUNTER — Telehealth

## 2022-05-03 NOTE — Telephone Encounter (Signed)
-----   Message from Bari Lessen, MD sent at 05/02/2022  7:58 AM EST -----  CT shows some mild inflammatory or scarring changes, non-specific. Still need to do PFTs and will likely follow in future for any worsening as well, but I would like her to do some autoimmune labs as well for evaluation.    CRP, ESR, ANA, RF, CCP, ANCA.   ----- Message -----  From: Edi, [facility] Incoming Orders Results To [facility]  Sent: 05/02/2022   7:23 AM EST  To: Bari Lessen, MD

## 2022-05-03 NOTE — Telephone Encounter (Signed)
 Spoke with the patient in regards to their CT scan results, explained per Dr. Aurora that the CT shows some mild inflammatory or scarring changes, non-specific.  I also explained that Dr. Aurora encourages to follow through with the CPFT that is scheduled for 05/22/2022 and will like to have some autoimmune labs as well for evaluation.  Patient was agreeable with the blood work and the orders have been established.  Patient understood the results and did not have any further questions or concerns at this time.  // Lucie Balding M.A.

## 2022-05-04 ENCOUNTER — Encounter

## 2022-05-04 LAB — SEDIMENTATION RATE: Sed Rate, Automated: 5 mm/hr (ref 0–30)

## 2022-05-04 LAB — ANA, DIRECT, W/REFLEX: ANA: NEGATIVE

## 2022-05-04 LAB — C-REACTIVE PROTEIN: CRP: 0.6 mg/dL (ref 0.0–0.9)

## 2022-05-05 LAB — CCP ANTIBODIES IGG/IGA: CCP Antibodies IgG/IgA: 5 units (ref 0–19)

## 2022-05-06 LAB — RHEUMATOID FACTOR: Rheumatoid Factor: NEGATIVE

## 2022-05-07 LAB — ANCA PANEL
Atypical pANCA: <1:20 {titer}
Cytoplasmic (C-ANCA): <1:20 {titer}
Myeloperoxidase Ab: <0.2 units (ref 0.0–0.9)
Perinuclear (P-ANCA): <1:20 {titer}
Proteinase 3 AB: <0.2 units (ref 0.0–0.9)

## 2022-05-22 ENCOUNTER — Encounter: Admit: 2022-05-22 | Discharge: 2022-05-22 | Payer: MEDICARE | Primary: Nurse Practitioner

## 2022-05-22 DIAGNOSIS — R0602 Shortness of breath: Secondary | ICD-10-CM

## 2022-05-22 LAB — AMB POC SPIROMETRY W/BRONCHODILATOR
FEV 1 , POC: 1.45 L
FEV1 % Pred POC: 76 %
FEV1/FVC, POC: 87
FVC % Pred POC: 70 %
FVC, POC: 1.66

## 2022-05-22 NOTE — Progress Notes (Signed)
CPFT performed.

## 2022-06-20 ENCOUNTER — Ambulatory Visit
Admit: 2022-06-20 | Discharge: 2022-06-20 | Payer: MEDICARE | Attending: Nurse Practitioner | Primary: Nurse Practitioner

## 2022-06-20 DIAGNOSIS — J020 Streptococcal pharyngitis: Secondary | ICD-10-CM

## 2022-06-20 LAB — AMB POC INFLUENZA A  AND B REAL-TIME RT-PCR
Influenza A Antigen, POC: NEGATIVE
Influenza B Antigen, POC: NEGATIVE

## 2022-06-20 LAB — AMB POC COVID-19 COV: SARS-COV-2 RNA, POC: NEGATIVE

## 2022-06-20 MED ORDER — METHYLPREDNISOLONE 4 MG PO TBPK
4 | PACK | ORAL | 0 refills | Status: AC
Start: 2022-06-20 — End: 2022-06-26

## 2022-06-20 NOTE — Progress Notes (Signed)
[facility]  [address]  [place], [place] [address]   (ph) [phone] (fax) [phone]  Falisha Osment L. Cothran-Pate APRN, FNP-C      Chief Complaint   Patient presents with    Cough    Congestion     Started end of last week seen by Monday UC was told she had strep. Given Azithomyocin 500 mg qd no better worse. Fatigue, lightheadedness.       67 yo female comes in c/o cough/congestion/strep. Started end of last week  and was seen  Monday  at [facility]  and was told she had strep. Given Azithomyocin 500 mg qd no better worse. Fatigue, lightheadedness. Feels like she has a lot of drainage and her sinuses are flaring with left ear pain. She has not used her inhaler with this but feels it may help.       Cough  Associated symptoms include ear pain (left) and a sore throat.       Allergies   Allergen Reactions    Atorvastatin Other (See Comments)     Muscle pain    Hydrocodone-Acetaminophen Itching    Penicillins Hives       Past Medical History:   Diagnosis Date    Arthritis     Chronic back pain     Hypertension     Hyperthyroidism     Thyroid disease     hypothyroid       Family History   Problem Relation Age of Onset    Asthma Mother     Lung Disease Mother     High Blood Pressure Mother     Liver Disease Father     Alcohol Abuse Father     Early Death Sister     Stroke Brother     Diabetes Brother     Alcohol Abuse Brother     Early Death Brother     Heart Attack Brother     High Blood Pressure Brother     Substance Abuse Brother     Alcohol Abuse Brother     Diabetes Brother     High Blood Pressure Brother     Substance Abuse Brother     Breast Cancer Neg Hx        Social History     Socioeconomic History    Marital status: Divorced     Spouse name: Not on file    Number of children: Not on file    Years of education: Not on file    Highest education level: Not on file   Occupational History    Not on file   Tobacco Use    Smoking status: Former     Current packs/day: 0.00      Average packs/day: 1 pack/day for 17.7 years (17.7 ttl pk-yrs)     Types: Cigarettes     Start date: 11     Quit date: 10/25/1996     Years since quitting: 25.6     Passive exposure: Never    Smokeless tobacco: Former   Substance and Sexual Activity    Alcohol use: No    Drug use: No    Sexual activity: Not Currently     Partners: Male   Other Topics Concern    Not on file   Social History Narrative    Not on file     Social Determinants of Health     Financial Resource Strain: Low Risk  (06/20/2022)  Overall Financial Resource Strain (CARDIA)     Difficulty of Paying Living Expenses: Not hard at all   Food Insecurity: No Food Insecurity (06/20/2022)    Hunger Vital Sign     Worried About Running Out of Food in the Last Year: Never true     Ran Out of Food in the Last Year: Never true   Transportation Needs: Unknown (06/20/2022)    PRAPARE - Therapist, art (Medical): Not on file     Lack of Transportation (Non-Medical): No   Physical Activity: Unknown (12/22/2021)    Exercise Vital Sign     Days of Exercise per Week: 0 days     Minutes of Exercise per Session: Not on file   Stress: Not on file   Social Connections: Not on file   Intimate Partner Violence: Not on file   Housing Stability: Unknown (06/20/2022)    Housing Stability Vital Sign     Unable to Pay for Housing in the Last Year: Not on file     Number of Places Lived in the Last Year: Not on file     Unstable Housing in the Last Year: No       OB History    No obstetric history on file.         Past Surgical History:   Procedure Laterality Date    APPENDECTOMY      CERVICAL DISCECTOMY  09/16/06    Anterior C5-C6 diskectomy with decompression;  anterior cervical arthrodesis interbody technique;  anterior cervical instrumentation;  tricortical iliac crest bone graft harvested through a separate incision.     CHOLECYSTECTOMY      TAH AND BSO (CERVIX REMOVED)      TUBAL LIGATION         Health Maintenance   Topic Date Due    Pneumococcal  65+ years Vaccine (1 of 2 - PCV) Never done    HIV screen  Never done    Respiratory Syncytial Virus (RSV) Pregnant or age 72 yrs+ (1 - 1-dose 60+ series) Never done    COVID-19 Vaccine (5 - 2023-24 season) 10/26/2021    Annual Wellness Visit (Medicare Advantage)  02/25/2022    DTaP/Tdap/Td vaccine (2 - Td or Tdap) 09/26/2022    Lipids  03/29/2023    Depression Screen  03/29/2023    Breast cancer screen  09/05/2023    Colorectal Cancer Screen  11/27/2023    DEXA (modify frequency per FRAX score)  Completed    Flu vaccine  Completed    Shingles vaccine  Completed    Hepatitis C screen  Completed    Hepatitis A vaccine  Aged Out    Hepatitis B vaccine  Aged Out    Hib vaccine  Aged Out    Polio vaccine  Aged Out    Meningococcal (ACWY) vaccine  Aged Out    Diabetes screen  Discontinued    Cervical cancer screen  Discontinued         Current Outpatient Medications:     methylPREDNISolone (MEDROL DOSEPACK) 4 MG tablet, Take by mouth., Disp: 1 kit, Rfl: 0    Multiple Vitamins-Minerals (CULTURELLE PROBIOTICS + MULTIV) CHEW, Take by mouth, Disp: , Rfl:     vitamin D (VITAMIN D3) 50 MCG (2000 UT) CAPS capsule, Take 1 capsule by mouth daily, Disp: , Rfl:     budesonide-formoterol (SYMBICORT) 160-4.5 MCG/ACT AERO, Inhale 2 puffs into the lungs 2 times daily, Disp: 30.6 g, Rfl: 1  montelukast (SINGULAIR) 10 MG tablet, Take 1 tablet by mouth daily, Disp: 30 tablet, Rfl: 3    pravastatin (PRAVACHOL) 20 MG tablet, Take 1 tablet by mouth daily, Disp: 90 tablet, Rfl: 1    cyclobenzaprine (FLEXERIL) 10 MG tablet, Take 1 tablet by mouth daily, Disp: 30 tablet, Rfl: 3    levothyroxine (SYNTHROID) 75 MCG tablet, Take 1 tablet by mouth every morning (before breakfast), Disp: 90 tablet, Rfl: 1    sertraline (ZOLOFT) 50 MG tablet, Take 1 tablet by mouth daily, Disp: 90 tablet, Rfl: 1    lisinopril (PRINIVIL;ZESTRIL) 10 MG tablet, Take 1 tablet by mouth daily, Disp: 90 tablet, Rfl: 1    metoprolol succinate (TOPROL XL) 50 MG extended  release tablet, Take 1 tablet by mouth 2 times daily (Patient taking differently: Take 1 tablet by mouth), Disp: 180 tablet, Rfl: 1    alendronate (FOSAMAX) 70 MG tablet, 1 tablet every 7 days, Disp: , Rfl:     aspirin 81 MG chewable tablet, Take by mouth daily, Disp: , Rfl:     albuterol sulfate HFA (VENTOLIN HFA) 108 (90 Base) MCG/ACT inhaler, Inhale 2 puffs into the lungs 4 times daily as needed for Wheezing (Patient not taking: Reported on 06/20/2022), Disp: 18 g, Rfl: 0    etanercept (ENBREL) 50 MG/ML injection, Inject 0.5 mLs into the skin once (Patient not taking: Reported on 06/20/2022), Disp: , Rfl:     meloxicam (MOBIC) 15 MG tablet, Take 1 tablet by mouth daily, Disp: 30 tablet, Rfl: 0    Review of Systems   Constitutional: Negative.    HENT:  Positive for ear pain (left) and sore throat.    Eyes: Negative.    Respiratory:  Positive for cough.    Cardiovascular: Negative.    Gastrointestinal: Negative.    Endocrine: Negative.    Genitourinary: Negative.    Musculoskeletal: Negative.    Skin: Negative.    Allergic/Immunologic: Negative.    Neurological: Negative.    Hematological: Negative.    Psychiatric/Behavioral: Negative.          Vitals:    06/20/22 1021   BP: 126/76   Pulse: (!) 108   Temp: 98.7 F (37.1 C)   SpO2: 100%        Physical Exam  Constitutional:       Appearance: Normal appearance.   HENT:      Head: Normocephalic.      Nose: Congestion present.   Eyes:      Extraocular Movements: Extraocular movements intact.      Pupils: Pupils are equal, round, and reactive to light.   Cardiovascular:      Rate and Rhythm: Normal rate and regular rhythm.   Pulmonary:      Effort: Pulmonary effort is normal.      Breath sounds: Normal breath sounds.   Abdominal:      General: Abdomen is flat.      Palpations: Abdomen is soft.   Musculoskeletal:         General: Normal range of motion.      Cervical back: Normal range of motion and neck supple.   Skin:     General: Skin is warm and dry.   Neurological:       General: No focal deficit present.      Mental Status: She is alert and oriented to person, place, and time.   Psychiatric:         Mood and Affect: Mood normal.  Behavior: Behavior normal.                03/28/2022     8:01 AM 01/29/2022    11:01 AM 12/22/2021     7:38 PM 08/14/2021     9:38 PM 04/04/2021     4:02 PM 03/23/2021     3:34 PM 01/09/2021     3:47 PM   PHQ Scores   PHQ2 Score 0 0 0 0 0 0 0   PHQ9 Score 0 0 0 0 0 0 0        Assessment & Plan:    1. Acute streptococcal pharyngitis  Complete ATB. It should work for 5 days after completion of med. Instructed to use saline gargles. Chloraseptic spray as directed. Motrin for fever.    2. Cough, unspecified type  Delsym prn cough  - AMB POC RAPID INFLUENZA TEST  - AMB POC COVID-19 COV  - AMB POC INFLUENZA A  AND B REAL-TIME RT-PCR  Medrol dose pak as directed  Albuterol prn     3. Left ear pain  Ear looks fine    4. Allergic rhinitis, unspecified seasonality, unspecified trigger  Medrol dose pak as directed        Greater than 50% counseling and/or coordination of care: call for worsening symptoms. This note was dictated using dragon voice recognition software.  It has been proofread, but there may still exist voice recognition errors that the author did not detect.      Signed By: Erskine Squibb, APRN - CNP     June 20, 2022

## 2022-07-03 ENCOUNTER — Encounter

## 2022-07-03 ENCOUNTER — Ambulatory Visit
Admit: 2022-07-03 | Discharge: 2022-07-03 | Payer: MEDICARE | Attending: Nurse Practitioner | Primary: Nurse Practitioner

## 2022-07-03 DIAGNOSIS — R059 Cough, unspecified: Secondary | ICD-10-CM

## 2022-07-03 LAB — AMB POC HEMOGLOBIN A1C: Hemoglobin A1C, POC: 5.6 %

## 2022-07-03 MED ORDER — ALENDRONATE SODIUM 70 MG PO TABS
70 MG | ORAL_TABLET | ORAL | 3 refills | Status: DC
Start: 2022-07-03 — End: 2022-10-03

## 2022-07-03 MED ORDER — ALBUTEROL SULFATE HFA 108 (90 BASE) MCG/ACT IN AERS
10890 (90 Base) MCG/ACT | Freq: Four times a day (QID) | RESPIRATORY_TRACT | 0 refills | Status: DC | PRN
Start: 2022-07-03 — End: 2022-10-03

## 2022-07-03 MED ORDER — LEVOTHYROXINE SODIUM 75 MCG PO TABS
75 MCG | ORAL_TABLET | Freq: Every day | ORAL | 1 refills | Status: DC
Start: 2022-07-03 — End: 2022-10-03

## 2022-07-03 MED ORDER — LISINOPRIL 10 MG PO TABS
10 MG | ORAL_TABLET | Freq: Every day | ORAL | 1 refills | Status: DC
Start: 2022-07-03 — End: 2022-10-03

## 2022-07-03 MED ORDER — PRAVASTATIN SODIUM 20 MG PO TABS
20 MG | ORAL_TABLET | Freq: Every day | ORAL | 1 refills | Status: DC
Start: 2022-07-03 — End: 2022-10-03

## 2022-07-03 MED ORDER — MONTELUKAST SODIUM 10 MG PO TABS
10 MG | ORAL_TABLET | Freq: Every day | ORAL | 3 refills | Status: DC
Start: 2022-07-03 — End: 2022-10-03

## 2022-07-03 MED ORDER — SERTRALINE HCL 50 MG PO TABS
50 MG | ORAL_TABLET | Freq: Every day | ORAL | 1 refills | Status: DC
Start: 2022-07-03 — End: 2022-10-03

## 2022-07-03 MED ORDER — VITAMIN D3 50 MCG (2000 UT) PO CAPS
50 | ORAL_CAPSULE | Freq: Every day | ORAL | 3 refills | Status: AC
Start: 2022-07-03 — End: ?

## 2022-07-03 NOTE — Progress Notes (Signed)
[facility]  [address]  [place], [place] [address]   (ph) [phone] (fax) [phone]  Gyanna Jarema L. Cothran-Pate APRN, FNP-C      Chief Complaint   Patient presents with    Follow-up       67 yo female comes in as a recheck of chronic conditions. She reports strep finally cleared; however, she has still got a lingering cough. She goes back to Dr. Dayton Martes in 2 weeks and should be having a repeat chest CT for mild ILD. She feels that she has kept an aggravating cough since having Covid last 10/23.         Allergies   Allergen Reactions    Atorvastatin Other (See Comments)     Muscle pain    Hydrocodone-Acetaminophen Itching    Penicillins Hives       Past Medical History:   Diagnosis Date    Arthritis     Chronic back pain     Hypertension     Hyperthyroidism     Thyroid disease     hypothyroid       Family History   Problem Relation Age of Onset    Asthma Mother     Lung Disease Mother     High Blood Pressure Mother     Liver Disease Father     Alcohol Abuse Father     Early Death Sister     Stroke Brother     Diabetes Brother     Alcohol Abuse Brother     Early Death Brother     Heart Attack Brother     High Blood Pressure Brother     Substance Abuse Brother     Alcohol Abuse Brother     Diabetes Brother     High Blood Pressure Brother     Substance Abuse Brother     Breast Cancer Neg Hx        Social History     Socioeconomic History    Marital status: Divorced     Spouse name: Not on file    Number of children: Not on file    Years of education: Not on file    Highest education level: Not on file   Occupational History    Not on file   Tobacco Use    Smoking status: Former     Current packs/day: 0.00     Average packs/day: 1 pack/day for 17.7 years (17.7 ttl pk-yrs)     Types: Cigarettes     Start date: 86     Quit date: 10/25/1996     Years since quitting: 25.7     Passive exposure: Never    Smokeless tobacco: Former   Substance and Sexual Activity    Alcohol use: No     Drug use: No    Sexual activity: Not Currently     Partners: Male   Other Topics Concern    Not on file   Social History Narrative    Not on file     Social Determinants of Health     Financial Resource Strain: Low Risk  (06/20/2022)    Overall Financial Resource Strain (CARDIA)     Difficulty of Paying Living Expenses: Not hard at all   Food Insecurity: No Food Insecurity (06/20/2022)    Hunger Vital Sign     Worried About Running Out of Food in the Last Year: Never true     Ran Out of Food  in the Last Year: Never true   Transportation Needs: Unknown (06/20/2022)    PRAPARE - Therapist, art (Medical): Not on file     Lack of Transportation (Non-Medical): No   Physical Activity: Unknown (12/22/2021)    Exercise Vital Sign     Days of Exercise per Week: 0 days     Minutes of Exercise per Session: Not on file   Stress: Not on file   Social Connections: Not on file   Intimate Partner Violence: Not on file   Housing Stability: Unknown (06/20/2022)    Housing Stability Vital Sign     Unable to Pay for Housing in the Last Year: Not on file     Number of Places Lived in the Last Year: Not on file     Unstable Housing in the Last Year: No       OB History    No obstetric history on file.         Past Surgical History:   Procedure Laterality Date    APPENDECTOMY      CERVICAL DISCECTOMY  09/16/06    Anterior C5-C6 diskectomy with decompression;  anterior cervical arthrodesis interbody technique;  anterior cervical instrumentation;  tricortical iliac crest bone graft harvested through a separate incision.     CHOLECYSTECTOMY      TAH AND BSO (CERVIX REMOVED)      TUBAL LIGATION         Health Maintenance   Topic Date Due    Pneumococcal 65+ years Vaccine (1 of 2 - PCV) Never done    HIV screen  Never done    Respiratory Syncytial Virus (RSV) Pregnant or age 73 yrs+ (1 - 1-dose 60+ series) Never done    COVID-19 Vaccine (5 - 2023-24 season) 10/26/2021    Annual Wellness Visit (Medicare Advantage)   02/25/2022    DTaP/Tdap/Td vaccine (2 - Td or Tdap) 09/26/2022    Lipids  03/29/2023    Depression Screen  03/29/2023    Breast cancer screen  09/05/2023    Colorectal Cancer Screen  11/27/2023    DEXA (modify frequency per FRAX score)  Completed    Flu vaccine  Completed    Shingles vaccine  Completed    Hepatitis C screen  Completed    Hepatitis A vaccine  Aged Out    Hepatitis B vaccine  Aged Out    Hib vaccine  Aged Out    Polio vaccine  Aged Out    Meningococcal (ACWY) vaccine  Aged Out    Diabetes screen  Discontinued    Cervical cancer screen  Discontinued         Current Outpatient Medications:     albuterol sulfate HFA (VENTOLIN HFA) 108 (90 Base) MCG/ACT inhaler, Inhale 2 puffs into the lungs 4 times daily as needed for Wheezing, Disp: 18 g, Rfl: 0    alendronate (FOSAMAX) 70 MG tablet, Take 1 tablet by mouth every 7 days, Disp: 4 tablet, Rfl: 3    levothyroxine (SYNTHROID) 75 MCG tablet, Take 1 tablet by mouth every morning (before breakfast), Disp: 90 tablet, Rfl: 1    lisinopril (PRINIVIL;ZESTRIL) 10 MG tablet, Take 1 tablet by mouth daily, Disp: 90 tablet, Rfl: 1    montelukast (SINGULAIR) 10 MG tablet, Take 1 tablet by mouth daily, Disp: 30 tablet, Rfl: 3    pravastatin (PRAVACHOL) 20 MG tablet, Take 1 tablet by mouth daily, Disp: 90 tablet, Rfl: 1    sertraline (  ZOLOFT) 50 MG tablet, Take 1 tablet by mouth daily, Disp: 90 tablet, Rfl: 1    Cholecalciferol (VITAMIN D3) 50 MCG (2000 UT) CAPS, Take 1 capsule by mouth daily, Disp: 30 capsule, Rfl: 3    Multiple Vitamins-Minerals (CULTURELLE PROBIOTICS + MULTIV) CHEW, Take by mouth, Disp: , Rfl:     budesonide-formoterol (SYMBICORT) 160-4.5 MCG/ACT AERO, Inhale 2 puffs into the lungs 2 times daily, Disp: 30.6 g, Rfl: 1    etanercept (ENBREL) 50 MG/ML injection, Inject 0.5 mLs into the skin once, Disp: , Rfl:     cyclobenzaprine (FLEXERIL) 10 MG tablet, Take 1 tablet by mouth daily, Disp: 30 tablet, Rfl: 3    aspirin 81 MG chewable tablet, Take by mouth  daily, Disp: , Rfl:     meloxicam (MOBIC) 15 MG tablet, Take 1 tablet by mouth daily, Disp: 30 tablet, Rfl: 0    Review of Systems   Constitutional: Negative.    HENT: Negative.     Eyes: Negative.    Respiratory:  Positive for cough (chronic).    Cardiovascular: Negative.    Gastrointestinal: Negative.    Endocrine: Negative.    Genitourinary: Negative.    Musculoskeletal: Negative.    Skin: Negative.    Allergic/Immunologic: Negative.    Neurological: Negative.    Hematological: Negative.    Psychiatric/Behavioral: Negative.          Vitals:    07/03/22 0751   BP: 127/76   Pulse: 87   Temp: 97.7 F (36.5 C)   SpO2: 97%        Physical Exam  Constitutional:       Appearance: Normal appearance.   HENT:      Head: Normocephalic.      Nose: Nose normal.   Eyes:      Extraocular Movements: Extraocular movements intact.      Pupils: Pupils are equal, round, and reactive to light.   Cardiovascular:      Rate and Rhythm: Normal rate and regular rhythm.   Pulmonary:      Effort: Pulmonary effort is normal.      Breath sounds: Normal breath sounds.   Abdominal:      General: Abdomen is flat.      Palpations: Abdomen is soft.   Musculoskeletal:         General: Normal range of motion.      Cervical back: Normal range of motion and neck supple.   Skin:     General: Skin is warm and dry.   Neurological:      General: No focal deficit present.      Mental Status: She is alert and oriented to person, place, and time.   Psychiatric:         Mood and Affect: Mood normal.         Behavior: Behavior normal.                03/28/2022     8:01 AM 01/29/2022    11:01 AM 12/22/2021     7:38 PM 08/14/2021     9:38 PM 04/04/2021     4:02 PM 03/23/2021     3:34 PM 01/09/2021     3:47 PM   PHQ Scores   PHQ2 Score 0 0 0 0 0 0 0   PHQ9 Score 0 0 0 0 0 0 0        Assessment & Plan:    1. Cough, unspecified type  Stable; follow-up with Dr.  Sine in 2 weeks. Lungs sounded good today.   - albuterol sulfate HFA (VENTOLIN HFA) 108 (90 Base) MCG/ACT  inhaler; Inhale 2 puffs into the lungs 4 times daily as needed for Wheezing  Dispense: 18 g; Refill: 0  - montelukast (SINGULAIR) 10 MG tablet; Take 1 tablet by mouth daily  Dispense: 30 tablet; Refill: 3    2. Chronic bronchitis, unspecified chronic bronchitis type (HCC)    - albuterol sulfate HFA (VENTOLIN HFA) 108 (90 Base) MCG/ACT inhaler; Inhale 2 puffs into the lungs 4 times daily as needed for Wheezing  Dispense: 18 g; Refill: 0    3. Hypothyroidism, unspecified type  stable  - levothyroxine (SYNTHROID) 75 MCG tablet; Take 1 tablet by mouth every morning (before breakfast)  Dispense: 90 tablet; Refill: 1  - TSH with Reflex; Future    4. Primary hypertension  stable  - lisinopril (PRINIVIL;ZESTRIL) 10 MG tablet; Take 1 tablet by mouth daily  Dispense: 90 tablet; Refill: 1  - Comprehensive Metabolic Panel; Future  - CBC with Auto Differential; Future    5. Mixed hyperlipidemia  stable  - pravastatin (PRAVACHOL) 20 MG tablet; Take 1 tablet by mouth daily  Dispense: 90 tablet; Refill: 1  - Lipid Panel; Future  - CK; Future    6. Elevated glucose  A1C=5.6  - AMB POC HEMOGLOBIN A1C    7. Vitamin D deficiency    - Vitamin D 25 Hydroxy; Future    8. Visit for screening mammogram    - MAM DIGITAL SCREEN W OR WO CAD BILATERAL; Future    9. Screening for osteoporosis    - DEXA BONE DENSITY AXIAL SKELETON; Future    10. Depression, unspecified depression type  stable  - sertraline (ZOLOFT) 50 MG tablet; Take 1 tablet by mouth daily  Dispense: 90 tablet; Refill: 1    11. Osteoporosis, unspecified osteoporosis type, unspecified pathological fracture presence  stable  - alendronate (FOSAMAX) 70 MG tablet; Take 1 tablet by mouth every 7 days  Dispense: 4 tablet; Refill: 3  - Cholecalciferol (VITAMIN D3) 50 MCG (2000 UT) CAPS; Take 1 capsule by mouth daily  Dispense: 30 capsule; Refill: 3        Greater than 50% counseling and/or coordination of care: the treatment regimen is extensive; detailed review. Will notify of labs.  Recheck in 3 months. This note was dictated using dragon voice recognition software.  It has been proofread, but there may still exist voice recognition errors that the author did not detect.      Signed By: Erskine Squibb, APRN - CNP     Jul 03, 2022

## 2022-07-04 ENCOUNTER — Encounter

## 2022-07-04 LAB — LIPID PANEL
Chol/HDL Ratio: 3 (ref 0.0–5.0)
Cholesterol, Total: 129 MG/DL (ref 0–200)
HDL: 43 MG/DL (ref 40–60)
LDL Cholesterol: 67 MG/DL (ref 0–100)
Triglycerides: 98 MG/DL (ref 0–150)
VLDL Cholesterol Calculated: 20 MG/DL (ref 6–23)

## 2022-07-04 LAB — COMPREHENSIVE METABOLIC PANEL
ALT: 55 U/L (ref 12–65)
AST: 64 U/L — ABNORMAL HIGH (ref 15–37)
Albumin/Globulin Ratio: 1 (ref 1.0–1.9)
Albumin: 3.3 g/dL (ref 3.2–4.6)
Alk Phosphatase: 60 U/L (ref 35–104)
Anion Gap: 11 mmol/L (ref 9–18)
BUN: 8 MG/DL (ref 8–23)
CO2: 22 mmol/L (ref 20–28)
Calcium: 8.7 MG/DL — ABNORMAL LOW (ref 8.8–10.2)
Chloride: 110 mmol/L — ABNORMAL HIGH (ref 98–107)
Creatinine: 0.57 MG/DL — ABNORMAL LOW (ref 0.60–1.10)
Est, Glom Filt Rate: >90 mL/min/{1.73_m2} (ref >60)
Globulin: 3.2 g/dL (ref 2.3–3.5)
Glucose: 99 mg/dL (ref 70–99)
Potassium: 3.9 mmol/L (ref 3.5–5.1)
Sodium: 142 mmol/L (ref 136–145)
Total Bilirubin: 0.4 MG/DL (ref 0.0–1.2)
Total Protein: 6.5 g/dL (ref 6.3–8.2)

## 2022-07-04 LAB — CBC WITH AUTO DIFFERENTIAL
Basophils %: 1 % (ref 0.0–2.0)
Basophils Absolute: 0 10*3/uL (ref 0.0–0.2)
Eosinophils %: 5 % (ref 0.5–7.8)
Eosinophils Absolute: 0.2 10*3/uL (ref 0.0–0.8)
Hematocrit: 42.3 % (ref 35.8–46.3)
Hemoglobin: 13.7 g/dL (ref 11.7–15.4)
Immature Granulocytes %: 1 % (ref 0.0–5.0)
Immature Granulocytes Absolute: 0 10*3/uL (ref 0.0–0.5)
Lymphocytes %: 46 % — ABNORMAL HIGH (ref 13–44)
Lymphocytes Absolute: 1.8 10*3/uL (ref 0.5–4.6)
MCH: 30.7 PG (ref 26.1–32.9)
MCHC: 32.4 g/dL (ref 31.4–35.0)
MCV: 94.8 FL (ref 82–102)
MPV: 10.7 FL (ref 9.4–12.3)
Monocytes %: 12 % (ref 4.0–12.0)
Monocytes Absolute: 0.4 10*3/uL (ref 0.1–1.3)
Neutrophils %: 35 % — ABNORMAL LOW (ref 43–78)
Neutrophils Absolute: 1.3 10*3/uL — ABNORMAL LOW (ref 1.7–8.2)
Platelets: 176 10*3/uL (ref 150–450)
RBC: 4.46 M/uL (ref 4.05–5.2)
RDW: 13.5 % (ref 11.9–14.6)
WBC: 3.8 10*3/uL — ABNORMAL LOW (ref 4.3–11.1)
nRBC: 0 10*3/uL (ref 0.0–0.2)

## 2022-07-04 LAB — TSH WITH REFLEX: TSH w Free Thyroid if Abnormal: 2.45 u[IU]/mL (ref 0.27–4.20)

## 2022-07-04 LAB — CK: Total CK: 44 U/L (ref 21–215)

## 2022-07-04 LAB — VITAMIN D 25 HYDROXY: Vit D, 25-Hydroxy: 34.9 ng/mL (ref 30.0–100.0)

## 2022-07-04 NOTE — Other (Signed)
Results to patient please.  Sodium is 142.  Potassium is normal as well.  AST which is a liver enzyme is still elevated.  I will refer to GI for evaluation.  Hemoglobin is 13.7.  TSH is within normal limits.  Vitamin D is within normal limits.  Total cholesterol is 129,  Triglycerides are 98, HDL is 43, and LDL 67.  Thanks

## 2022-07-15 ENCOUNTER — Encounter

## 2022-07-15 NOTE — Telephone Encounter (Signed)
Pt seen 5/8, needs script sent to [facility] for her cyclobenzaprine (FLEXERIL) 10 MG tablet [[id number]]    Order Details  Dose: 10 mg Route: Oral Frequency: DAILY   Dispense Quantity: 30 tablet Refills: 3          Sig: Take 1 tablet by mouth daily         Start Date: 12/25/21 End Date: --   Written Date: 12/25/21 Expiration Date: 12/25/22       Associated Diagnoses: Back muscle spasm [M62.830]   Original Order: cyclobenzaprine (FLEXERIL) 10 MG tablet [[id number]]   Providers      Authorizing Provider: Erskine Squibb, APRN - CNP  NPI: [id number]   Supervising Provider: Norva Pavlov, MD NPI: [id number]   Ordering User: Erskine Squibb, APRN - CNP

## 2022-07-16 MED ORDER — CYCLOBENZAPRINE HCL 10 MG PO TABS
10 MG | ORAL_TABLET | Freq: Every day | ORAL | 1 refills | Status: AC
Start: 2022-07-16 — End: 2022-10-03

## 2022-07-26 ENCOUNTER — Encounter: Payer: MEDICARE | Attending: Critical Care Medicine | Primary: Nurse Practitioner

## 2022-07-30 ENCOUNTER — Encounter

## 2022-07-30 ENCOUNTER — Ambulatory Visit: Admit: 2022-07-30 | Discharge: 2022-07-30 | Payer: MEDICARE | Attending: Gastroenterology | Primary: Nurse Practitioner

## 2022-07-30 DIAGNOSIS — R7989 Other specified abnormal findings of blood chemistry: Secondary | ICD-10-CM

## 2022-07-30 LAB — FERRITIN: Ferritin: 429 NG/ML — ABNORMAL HIGH (ref 8–388)

## 2022-07-30 NOTE — Patient Instructions (Signed)
Labs ordered, please complete   Ultrasound abdomen ordered to assess liver   Further recommendations to follow pending findings from imaging and labs   Ensure healthy lifestyle choice, exercise 45 min a day (at least brisk walk), monitor cholesterol and sugar intake.

## 2022-07-30 NOTE — Progress Notes (Signed)
Sandra Harmon (DOB:  1955-05-02) is a 67 y.o. female new patient referred to our office for evaluation of the following chief complaint(s):  Elevated LFT       Assessment & Plan   ASSESSMENT/PLAN:  1) Elevated LFTS- possible NASH/NAFLD without any recently imaging but in the setting of obese habitus/elevated BMI suspect fatty liver disease and metabolic issues. No heavy ETOH use. Would also like to rule out other causes for chronic liver disease such as autoimmune etiology, genetic, etc. No signs for cirrhosis clinically but platelet levels are dropping which can be a sign for developing cirrhosis and AST also elevated with normal ALT.     2) Health maintenance- prior colonoscopy in 2022 which showed benign polyp, was told to f/u in 5 years        Plan  Labs ordered, please complete   Ultrasound abdomen ordered to assess liver   Further recommendations to follow pending findings from imaging and labs   Ensure healthy lifestyle choice, exercise 45 min a day (at least brisk walk), monitor cholesterol and sugar intake.     Subjective   SUBJECTIVE/OBJECTIVE  Sandra Harmon is a 67 y.o. year old female with PMH pertinent for obesity and HTN, presents to the [facility] clinic for further management of elevated LFTs. Patient was found to have elevated AST earlier this year which was 61 and most recent labs showed that this increased to 64. No prior imaging of the liver and no known chronic liver disease. Patient denies any heavy ETOH or NSAID use. Last colonoscopy in 2020 and told that she needed a 5 year follow up exam given history of polyps. No prior EGD. Remaining Lfts normal. Patient denies any issues with weight loss, states occasional nausea, no vomiting, dysphagia, overt bleeding, changes in her bowel habits, etc. States that she has been working on her diet and weight.     Past Medical History:   Diagnosis Date    Arthritis     Chronic back pain     Hypertension     Hyperthyroidism     Thyroid disease      hypothyroid       Past Surgical History:   Procedure Laterality Date    APPENDECTOMY      CERVICAL DISCECTOMY  09/16/06    Anterior C5-C6 diskectomy with decompression;  anterior cervical arthrodesis interbody technique;  anterior cervical instrumentation;  tricortical iliac crest bone graft harvested through a separate incision.     CHOLECYSTECTOMY      TAH AND BSO (CERVIX REMOVED)      TUBAL LIGATION          Allergies   Allergen Reactions    Atorvastatin Other (See Comments)     Muscle pain    Hydrocodone-Acetaminophen Itching    Penicillins Hives        Family History   Problem Relation Age of Onset    Asthma Mother     Lung Disease Mother     High Blood Pressure Mother     Liver Disease Father     Alcohol Abuse Father     Early Death Sister     Stroke Brother     Diabetes Brother     Alcohol Abuse Brother     Early Death Brother     Heart Attack Brother     High Blood Pressure Brother     Substance Abuse Brother     Alcohol Abuse Brother     Diabetes Brother  High Blood Pressure Brother     Substance Abuse Brother     Breast Cancer Neg Hx        Current Outpatient Medications   Medication Sig Dispense Refill    cyclobenzaprine (FLEXERIL) 10 MG tablet Take 1 tablet by mouth daily 30 tablet 1    albuterol sulfate HFA (VENTOLIN HFA) 108 (90 Base) MCG/ACT inhaler Inhale 2 puffs into the lungs 4 times daily as needed for Wheezing 18 g 0    alendronate (FOSAMAX) 70 MG tablet Take 1 tablet by mouth every 7 days 4 tablet 3    levothyroxine (SYNTHROID) 75 MCG tablet Take 1 tablet by mouth every morning (before breakfast) 90 tablet 1    lisinopril (PRINIVIL;ZESTRIL) 10 MG tablet Take 1 tablet by mouth daily 90 tablet 1    montelukast (SINGULAIR) 10 MG tablet Take 1 tablet by mouth daily 30 tablet 3    pravastatin (PRAVACHOL) 20 MG tablet Take 1 tablet by mouth daily 90 tablet 1    sertraline (ZOLOFT) 50 MG tablet Take 1 tablet by mouth daily 90 tablet 1    Cholecalciferol (VITAMIN D3) 50 MCG (2000 UT) CAPS Take 1  capsule by mouth daily 30 capsule 3    Multiple Vitamins-Minerals (CULTURELLE PROBIOTICS + MULTIV) CHEW Take by mouth      budesonide-formoterol (SYMBICORT) 160-4.5 MCG/ACT AERO Inhale 2 puffs into the lungs 2 times daily 30.6 g 1    etanercept (ENBREL) 50 MG/ML injection Inject 0.5 mLs into the skin once      meloxicam (MOBIC) 15 MG tablet Take 1 tablet by mouth daily 30 tablet 0    aspirin 81 MG chewable tablet Take by mouth daily       No current facility-administered medications for this visit.       Review of Systems  All other systems reviewed and are negative except as noted above.          Objective     Vitals:    07/30/22 0938   BP: 132/76   Pulse: (!) 104   Resp: 16   SpO2: 95%       Physical Exam  Constitutional:       Appearance: She is not ill-appearing.   HENT:      Head: Normocephalic.      Nose: Nose normal.   Eyes:      Extraocular Movements: Extraocular movements intact.   Cardiovascular:      Rate and Rhythm: Normal rate.   Pulmonary:      Effort: Pulmonary effort is normal.   Abdominal:      Palpations: Abdomen is soft.   Musculoskeletal:         General: Normal range of motion.      Cervical back: Normal range of motion.   Skin:     General: Skin is warm.   Neurological:      General: No focal deficit present.   Psychiatric:         Mood and Affect: Mood normal.            Thank you for involving the GI service in the care of your patient. Please dont hesitate to call me directly on my cell below with any questions, updates, etc.      -Glennie Isle, MD  [phone]   Gastroenterology/Hepatology

## 2022-08-01 LAB — NASH FIBROSURE PLUS
ALT: 47 IU/L — ABNORMAL HIGH (ref 0–40)
AST (SGOT)   (NASH), 13987: 50 IU/L — ABNORMAL HIGH (ref 0–40)
Alpha 2-Macroglobulins, Qn: 297 mg/dL — ABNORMAL HIGH (ref 110–276)
Apolipoprotein A-1: 191 mg/dL (ref 116–209)
Cholesterol, Total: 177 mg/dL (ref 100–199)
Fibrosis Score: 0.41 — ABNORMAL HIGH (ref 0.00–0.21)
GGT: 74 IU/L — ABNORMAL HIGH (ref 0–60)
Glucose: 157 mg/dL — ABNORMAL HIGH (ref 70–99)
Haptoglobin: 80 mg/dL (ref 37–355)
NASH - Steatosis Score: 0.65 — ABNORMAL HIGH (ref 0.00–0.40)
NASH Score: 0.8 — ABNORMAL HIGH (ref 0.00–0.25)
Total Bilirubin: 0.4 mg/dL (ref 0.0–1.2)
Triglycerides: 120 mg/dL (ref 0–149)

## 2022-08-01 LAB — CELIAC AB TTG DGP TIGA
Gliadin Antibodies IgA: 8 units (ref 0–19)
Gliadin Antibodies IgG: 1 units (ref 0–19)
IgA: 223 mg/dL (ref 87–352)
TTG, IgG: <2 U/mL (ref 0–5)
Tissue Transglutaminase IgA: <2 U/mL (ref 0–3)

## 2022-08-01 LAB — LIVER-KIDNEY MICROSOME (LKM-1) ANTIBODY (IGG): Liver-Kidney Microsome-1 Ab IgG: 0.7 Units (ref 0.0–20.0)

## 2022-08-01 LAB — IGG, IGA, IGM
IgA: 224 mg/dL (ref 87–352)
IgG, Serum: 1029 mg/dL (ref 586–1602)
IgM: 94 mg/dL (ref 26–217)

## 2022-08-01 LAB — ALPHA-1-ANTITRYPSIN: A-1 Antitrypsin: 169 mg/dL (ref 101–187)

## 2022-08-01 LAB — MITOCHONDRIAL ANTIBODIES, M2: Mitochondrial M2 Ab, IgG: <20 Units (ref 0.0–20.0)

## 2022-08-01 LAB — ANTI-SMOOTH MUSCLE ANTIBODY: Smooth Muscle Ab: 3 Units (ref 0–19)

## 2022-08-01 LAB — CERULOPLASMIN: Ceruloplasmin: 26.1 mg/dL (ref 19.0–39.0)

## 2022-08-10 ENCOUNTER — Ambulatory Visit: Payer: MEDICARE | Primary: Nurse Practitioner

## 2022-08-10 ENCOUNTER — Encounter

## 2022-08-10 DIAGNOSIS — R7989 Other specified abnormal findings of blood chemistry: Secondary | ICD-10-CM

## 2022-08-13 ENCOUNTER — Encounter

## 2022-09-06 ENCOUNTER — Ambulatory Visit
Admit: 2022-09-06 | Discharge: 2022-09-06 | Payer: MEDICARE | Attending: Critical Care Medicine | Primary: Nurse Practitioner

## 2022-09-06 DIAGNOSIS — R0602 Shortness of breath: Secondary | ICD-10-CM

## 2022-09-06 NOTE — Progress Notes (Signed)
Name:  Sandra Harmon  Date of Birth:  [birth year]   MRN: [id number]      Office Visit: 09/06/2022        ASSESSMENT AND PLAN:  (Medical Decision Making)    Impression: 67 y.o. female with ankylosing spondylitis, obesity, hypertension, hypothyroidism with worsening shortness of breath and cough post COVID 4 months ago.    1. Shortness of breath  Stable CT findings with mild chronic institial changes of unclear etiology, possibly post COVID-19. Pulmonary function tests were satisfactory. Patient has discontinued Symbicort due to feeling better and uses rescue inhaler as needed.   -Continue rescue inhaler as needed.   -Consider use of Symbicort as needed.   -Order repeat CT scan in three months to monitor small spot noted on previous scan.     2. Post covid-19 condition, unspecified  Patient reports improved diet, exercise, and weight loss which may be contributing to improved respiratory symptoms.   -Encourage continued weight loss and healthy lifestyle changes.     3. Pulmonary nodule  Plan to reassess after repeat CT scan in three months. Depending on results, may schedule follow-up CT and repeat pulmonary function tests in six months to a year.   - CT CHEST WO CONTRAST; Future    No orders of the defined types were placed in this encounter.    No orders of the defined types were placed in this encounter.    Follow-up and Dispositions    Return in about 3 months (around 12/07/2022) for Dr. Dayton Martes or NP Coutu, After CT.     Floreen Comber, MD    No specialty comments available.  _________________________________________________________________________    HISTORY OF PRESENT ILLNESS:    Sandra Harmon is a 67 y.o. female who is seen at [facility] today for  Follow-up and Other (SOB)  She is a 67 year old female with no history of pulmonary disease who presents after having COVID back in October 2023.  She did go to the ED and was given Paxlovid and recovered from the acute symptoms, but had persistent  shortness of breath, difficulty taking a deep breath and and cough.  She has noticed frequent cough worse at night nonproductive.  She also had another recurrent illness a couple weeks ago and treated for bronchitis.  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  She has noticed some wheezing may be worse with walking upstairs though she does not do any specific exercise.  She has noted some chest tightness associated with coughing.  She quit smoking in 1996.  Her mom had COPD and emphysema and smoked heavily.  She is not currently on any therapy for ankylosing spondylitis, but has taken Enbrel in the past and is trying to get recertified for it again.  She denies any fevers, chills, night sweats or weight loss.    Interval History: A 67 year old with a history of ankylosing spondylitis, obesity, hypertension, and hypothyroidism presents for a follow-up visit post COVID. She reports feeling better overall and has discontinued Symbicort due to feeling better without it. She still has a rescue inhaler for use as needed, particularly during hot weather when she sometimes struggles with her breathing. She has been making lifestyle changes, including eating better, losing weight, and exercising, which she believes will continue to improve her breathing. She also has a small spot on a recent CT scan, which could be post COVID or due to other causes.     REVIEW OF SYSTEMS: 10 point review  of systems is negative except as reported in HPI.    PHYSICAL EXAM: Body mass index is 35.75 kg/m.  Vitals:    09/06/22 1618   BP: 126/80   Pulse: 82   Resp: 18   Temp: 97.3 F (36.3 C)   TempSrc: Infrared   SpO2: 100%   Weight: 80.3 kg (177 lb)   Height: 1.499 m (4\' 11" )         General:   Alert, cooperative, no distress, appears stated age.        Eyes:   Conjunctivae/corneas clear. PERRL        Mouth/Throat:  Lips, mucosa, and tongue normal. Teeth and gums normal.        Lungs:   Clear bilaterally     Heart:    Regular rate and rhythm, S1, S2 normal, no murmur, click, rub or gallop.     Abdomen:    Soft, non-tender.     Extremities:  Extremities normal, atraumatic, no cyanosis or edema.     Skin:  Skin color normal. No rashes or lesions     Neurologic:  A&Ox3     DIAGNOSTIC TESTS:                                                                                    LABS:   Lab Results   Component Value Date/Time    WBC 3.8 07/03/2022 09:04 AM    HGB 13.7 07/03/2022 09:04 AM    HCT 42.3 07/03/2022 09:04 AM    PLT 176 07/03/2022 09:04 AM    TSH 2.160 03/28/2022 08:42 AM    ANA Negative 05/03/2022 01:55 PM    RF Negative 05/03/2022 01:55 PM    CRP 0.6 05/03/2022 01:55 PM     Imaging: I performed an independent interpretation of the patient's images.  CXR: clear chest, no change compared to 2012.    XR CHEST STANDARD TWO VW 04/22/2022    Narrative  CHEST TWO VIEWS    Indication: Chronic cough and shortness of breath.    Reference: May 31, 2011.    FINDINGS: Chronic interstitial changes are noted. No consolidation. No  pneumothorax or effusion.  The heart is normal in size.  No acute bony  abnormalities are noted.    Impression  No acute cardiopulmonary process.    Chronic interstitial changes may reflect COPD in the appropriate clinical  setting.    CT Chest:     CT CHEST HIGH RESOLUTION 05/01/2022    Narrative  CT CHEST HIGH RESOLUTION - 05/01/2022 4:12 PM - [id number]    COMPARISON: 04/22/2022, 05/31/2011 chest radiograph    INDICATION: Shortness of breath; Post covid-19 condition, unspecified; Abnormal  findings on diagnostic imaging of other specified body structures.    TECHNIQUE:  Multiple axial images were obtained through the chest without IV contrast. High  resolution prone and supine imaging was performed. Lack of IV contrast decreases  test sensitivity and accuracy for vascular and solid visceral evaluation.  Radiation dose reduction techniques were used for this study: All CT scans  performed at this facility use one or  all of the following: Automated exposure  control, adjustment of  the mA and/or kVp according to patient's size, iterative  reconstruction.    FINDINGS:  Lungs including HIRES: Few scattered bilateral pulmonary nodules, largest 7 mm.  Mild scattered bilateral interstitial fibrosis greatest within the bilateral  lower lungs. No thickening of the interlobular septa or subpleural cystic  honeycomb formation seen. 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 Mild bilateral scar seen.    Vasculature:  Aorta mild calcific atherosclerotic disease.   No coronary artery calcification  seen.    Mediastinum/Hilum: Negative    Superior abdomen: No solid mass seen. Mild hepatic steatosis.    Osseous structures: Spine osteophytes seen. Partially seen cervical spine  fusion.    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    Few scattered bilateral pulmonary nodules, largest 7 mm. Recommend follow-up CT  chest without contrast in 3 to 6 months for continued surveillance.    Additional chronic findings.    Nuclear Medicine: No results found for this or any previous visit from the past 3650 days.    PFTs: Mild restrictive pattern, different effort with FEV1 in 70s.       Latest Ref Rng & Units 04/22/2022     8:21 AM   Office Spirometry Results   FVC L 1.47    FEV1 L 1.26    FEV1 %Pred-Pre % 64    FVC %Pred-Pre % 59    FEV1/FVC % 86      Results for orders placed or performed in visit on 05/22/22   AMB POC SPIROMETRY W/BRONCHODILATOR   Result Value Ref Range    FEV 1 , POC 1.45 L    FEV1 % Pred POC 76 %    FVC, POC 1.66     FVC % Pred POC 70 %    FEV1/FVC, POC 87     No results found for this or any previous visit.    FeNO: No results found for this or any previous visit.  FeNO and Likelihood of Eosinophilic Asthma   Unlikely Intermediate Likely   <25 ppb 25-50 ppb >50ppb     Exercise Oximetry:    Echo:    TRANSTHORACIC ECHOCARDIOGRAM (TTE) COMPLETE (CONTRAST/BUBBLE/3D PRN) 01/10/2021    Interpretation Summary    Left Ventricle: Hyperdynamic left ventricular systolic function with a visually estimated EF of 70 - 75%. Left ventricle size is normal. Mildly increased wall thickness. Findings consistent with mild concentric hypertrophy. Normal wall motion. Normal diastolic function for age. Average E/e' ratio is 7.36.    PMH Reference Info:                                                                                                                Immunization History   Administered Date(s) Administered    COVID-19, MODERNA BLUE border, Primary or Immunocompromised, (age 12y+), IM, 100 mcg/0.37mL 12/17/2019    COVID-19, PFIZER PURPLE top, DILUTE for use, (age 6 y+), 15mcg/0.3mL 05/05/2019,  05/26/2019, 12/17/2019    Influenza Virus Vaccine 11/30/2018, 11/23/2019    Influenza, FLUAD, (age 81 y+), Adjuvanted, 0.23mL 12/25/2021    Influenza, FLUARIX, FLULAVAL, FLUZONE (age 72 mo+) AND AFLURIA, (age 33 y+), PF, 0.76mL 11/23/2019    Influenza, FLUCELVAX, (age 72 mo+), MDCK, MDV, 0.56mL 12/16/2020    Influenza, FLUCELVAX, (age 72 mo+), MDCK, PF, 0.60mL 03/03/2018, 03/04/2018, 11/28/2018    Pneumococcal, PCV20, PREVNAR 20, (age 72w+), IM, 0.75mL 12/25/2021    TDaP, ADACEL (age 17y-64y), Leda Min (age 10y+), IM, 0.63mL 09/25/2012    Zoster Recombinant (Shingrix) 09/16/2018, 11/17/2018     Past Medical History:   Diagnosis Date    Arthritis     Chronic back pain     Hypertension     Hyperthyroidism     Thyroid disease     hypothyroid        Tobacco Use      Smoking status: Former        Packs/day: 0.00        Years: 1 pack/day for 17.7 years (17.7 ttl pk-yrs)        Types: Cigarettes        Start date: 34        Quit date: 10/25/1996        Years since quitting: 25.8        Passive exposure: Never      Smokeless tobacco: Former    Allergies   Allergen Reactions    Atorvastatin Other (See Comments)     Muscle pain     Hydrocodone-Acetaminophen Itching    Penicillins Hives     Current Outpatient Medications   Medication Instructions    albuterol sulfate HFA (VENTOLIN HFA) 108 (90 Base) MCG/ACT inhaler 2 puffs, Inhalation, 4 TIMES DAILY PRN    alendronate (FOSAMAX) 70 mg, Oral, EVERY 7 DAYS    aspirin 81 MG chewable tablet Oral, DAILY    budesonide-formoterol (SYMBICORT) 160-4.5 MCG/ACT AERO 2 puffs, Inhalation, 2 TIMES DAILY    cyclobenzaprine (FLEXERIL) 10 mg, Oral, DAILY    levothyroxine (SYNTHROID) 75 mcg, Oral, DAILY BEFORE BREAKFAST    lisinopril (PRINIVIL;ZESTRIL) 10 mg, Oral, DAILY    meloxicam (MOBIC) 15 mg, Oral, DAILY    montelukast (SINGULAIR) 10 mg, Oral, DAILY    Multiple Vitamins-Minerals (CULTURELLE PROBIOTICS + MULTIV) CHEW Oral    pravastatin (PRAVACHOL) 20 mg, Oral, DAILY    sertraline (ZOLOFT) 50 mg, Oral, DAILY    Vitamin D3 2,000 Units, Oral, DAILY

## 2022-10-03 ENCOUNTER — Encounter

## 2022-10-03 ENCOUNTER — Encounter
Admit: 2022-10-03 | Discharge: 2022-10-03 | Payer: MEDICARE | Attending: Nurse Practitioner | Primary: Nurse Practitioner

## 2022-10-03 DIAGNOSIS — F32A Depression, unspecified: Secondary | ICD-10-CM

## 2022-10-03 LAB — COMPREHENSIVE METABOLIC PANEL
ALT: 48 U/L (ref 12–65)
AST: 55 U/L — ABNORMAL HIGH (ref 15–37)
Albumin/Globulin Ratio: 1.3 (ref 1.0–1.9)
Albumin: 3.8 g/dL (ref 3.2–4.6)
Alk Phosphatase: 63 U/L (ref 35–104)
Anion Gap: 12 mmol/L (ref 9–18)
BUN: 7 MG/DL — ABNORMAL LOW (ref 8–23)
CO2: 23 mmol/L (ref 20–28)
Calcium: 9.2 MG/DL (ref 8.8–10.2)
Chloride: 109 mmol/L — ABNORMAL HIGH (ref 98–107)
Creatinine: 0.66 MG/DL (ref 0.60–1.10)
Est, Glom Filt Rate: >90 mL/min/{1.73_m2} (ref >60)
Globulin: 3 g/dL (ref 2.3–3.5)
Glucose: 89 mg/dL (ref 70–99)
Potassium: 3.7 mmol/L (ref 3.5–5.1)
Sodium: 144 mmol/L (ref 136–145)
Total Bilirubin: 0.4 MG/DL (ref 0.0–1.2)
Total Protein: 6.8 g/dL (ref 6.3–8.2)

## 2022-10-03 LAB — CBC WITH AUTO DIFFERENTIAL
Basophils %: 1 % (ref 0.0–2.0)
Basophils Absolute: 0 10*3/uL (ref 0.0–0.2)
Eosinophils %: 5 % (ref 0.5–7.8)
Eosinophils Absolute: 0.2 10*3/uL (ref 0.0–0.8)
Hematocrit: 46.6 % — ABNORMAL HIGH (ref 35.8–46.3)
Hemoglobin: 15.3 g/dL (ref 11.7–15.4)
Immature Granulocytes %: 0 % (ref 0.0–5.0)
Immature Granulocytes Absolute: 0 10*3/uL (ref 0.0–0.5)
Lymphocytes %: 46 % — ABNORMAL HIGH (ref 13–44)
Lymphocytes Absolute: 2 10*3/uL (ref 0.5–4.6)
MCH: 31.5 PG (ref 26.1–32.9)
MCHC: 32.8 g/dL (ref 31.4–35.0)
MCV: 96.1 FL (ref 82–102)
MPV: 10.6 FL (ref 9.4–12.3)
Monocytes %: 8 % (ref 4.0–12.0)
Monocytes Absolute: 0.3 10*3/uL (ref 0.1–1.3)
Neutrophils %: 40 % — ABNORMAL LOW (ref 43–78)
Neutrophils Absolute: 1.7 10*3/uL (ref 1.7–8.2)
Platelets: 201 10*3/uL (ref 150–450)
RBC: 4.85 M/uL (ref 4.05–5.2)
RDW: 13.1 % (ref 11.9–14.6)
WBC: 4.2 10*3/uL — ABNORMAL LOW (ref 4.3–11.1)
nRBC: 0 10*3/uL (ref 0.0–0.2)

## 2022-10-03 LAB — LIPID PANEL
Chol/HDL Ratio: 3.6 (ref 0.0–5.0)
Cholesterol, Total: 200 MG/DL (ref 0–200)
HDL: 56 MG/DL (ref 40–60)
LDL Cholesterol: 118 MG/DL — ABNORMAL HIGH (ref 0–100)
Triglycerides: 130 MG/DL (ref 0–150)
VLDL Cholesterol Calculated: 26 MG/DL — ABNORMAL HIGH (ref 6–23)

## 2022-10-03 LAB — TSH: TSH, 3rd Generation: 4.33 u[IU]/mL — ABNORMAL HIGH (ref 0.270–4.200)

## 2022-10-03 LAB — AMB POC HEMOGLOBIN A1C: Hemoglobin A1C, POC: 5.1 %

## 2022-10-03 MED ORDER — ALENDRONATE SODIUM 70 MG PO TABS
70 MG | ORAL_TABLET | ORAL | 3 refills | Status: AC
Start: 2022-10-03 — End: 2022-12-31

## 2022-10-03 MED ORDER — LEVOTHYROXINE SODIUM 75 MCG PO TABS
75 MCG | ORAL_TABLET | Freq: Every day | ORAL | 1 refills | Status: AC
Start: 2022-10-03 — End: 2022-12-31

## 2022-10-03 MED ORDER — CYCLOBENZAPRINE HCL 10 MG PO TABS
10 | ORAL_TABLET | Freq: Every day | ORAL | 1 refills | Status: AC
Start: 2022-10-03 — End: ?

## 2022-10-03 MED ORDER — ALBUTEROL SULFATE HFA 108 (90 BASE) MCG/ACT IN AERS
10890 (90 Base) MCG/ACT | Freq: Four times a day (QID) | RESPIRATORY_TRACT | 0 refills | Status: AC | PRN
Start: 2022-10-03 — End: 2022-12-27

## 2022-10-03 MED ORDER — SERTRALINE HCL 50 MG PO TABS
50 MG | ORAL_TABLET | Freq: Every day | ORAL | 1 refills | Status: AC
Start: 2022-10-03 — End: 2022-12-31

## 2022-10-03 NOTE — Progress Notes (Signed)
[facility]  [address]  [place], [place] [address]   (ph) [phone] (fax) [phone]  Montague Corella L. Cothran-Pate APRN, FNP-C      Chief Complaint   Patient presents with    Follow-up     3 month follow up, discuss test results from other doctors       67 yo female comes in for her 3 month follow up and to  discuss test results from other doctors. She has a lot of questions from recent physician visits.            Allergies   Allergen Reactions    Atorvastatin Other (See Comments)     Muscle pain    Hydrocodone-Acetaminophen Itching    Penicillins Hives       Past Medical History:   Diagnosis Date    Arthritis     Chronic back pain     Hypertension     Hyperthyroidism     Thyroid disease     hypothyroid       Family History   Problem Relation Age of Onset    Asthma Mother     Lung Disease Mother     High Blood Pressure Mother     Liver Disease Father     Alcohol Abuse Father     Early Death Sister     Stroke Brother     Diabetes Brother     Alcohol Abuse Brother     Early Death Brother     Heart Attack Brother     High Blood Pressure Brother     Substance Abuse Brother     Alcohol Abuse Brother     Diabetes Brother     High Blood Pressure Brother     Substance Abuse Brother     Breast Cancer Neg Hx        Social History     Socioeconomic History    Marital status: Divorced     Spouse name: Not on file    Number of children: Not on file    Years of education: Not on file    Highest education level: Not on file   Occupational History    Not on file   Tobacco Use    Smoking status: Former     Current packs/day: 0.00     Average packs/day: 1 pack/day for 17.7 years (17.7 ttl pk-yrs)     Types: Cigarettes     Start date: 51     Quit date: 10/25/1996     Years since quitting: 25.9     Passive exposure: Never    Smokeless tobacco: Former   Substance and Sexual Activity    Alcohol use: No    Drug use: No    Sexual activity: Not Currently     Partners: Male   Other Topics Concern     Not on file   Social History Narrative    Not on file     Social Determinants of Health     Financial Resource Strain: Low Risk  (06/20/2022)    Overall Financial Resource Strain (CARDIA)     Difficulty of Paying Living Expenses: Not hard at all   Food Insecurity: No Food Insecurity (06/20/2022)    Hunger Vital Sign     Worried About Running Out of Food in the Last Year: Never true     Ran Out of Food in the Last Year: Never true   Transportation Needs: Unknown (  06/20/2022)    PRAPARE - Therapist, art (Medical): Not on file     Lack of Transportation (Non-Medical): No   Physical Activity: Unknown (12/22/2021)    Exercise Vital Sign     Days of Exercise per Week: 0 days     Minutes of Exercise per Session: Not on file   Stress: Not on file   Social Connections: Not on file   Intimate Partner Violence: Not on file   Housing Stability: Unknown (06/20/2022)    Housing Stability Vital Sign     Unable to Pay for Housing in the Last Year: Not on file     Number of Places Lived in the Last Year: Not on file     Unstable Housing in the Last Year: No       OB History    No obstetric history on file.         Past Surgical History:   Procedure Laterality Date    APPENDECTOMY      CERVICAL DISCECTOMY  09/16/06    Anterior C5-C6 diskectomy with decompression;  anterior cervical arthrodesis interbody technique;  anterior cervical instrumentation;  tricortical iliac crest bone graft harvested through a separate incision.     CHOLECYSTECTOMY      TAH AND BSO (CERVIX REMOVED)      TUBAL LIGATION         Health Maintenance   Topic Date Due    HIV screen  Never done    Respiratory Syncytial Virus (RSV) Pregnant or age 39 yrs+ (1 - 15-dose 60+ series) Never done    COVID-19 Vaccine (5 - 2023-24 season) 10/26/2021    Annual Wellness Visit (Medicare Advantage)  02/25/2022    Flu vaccine (1) 09/26/2022    Depression Monitoring  03/29/2023    Breast cancer screen  09/05/2023    Colorectal Cancer Screen  11/27/2023     Diabetes screen  10/02/2025    Lipids  07/03/2027    DTaP/Tdap/Td vaccine (3 - Td or Tdap) 10/02/2032    DEXA (modify frequency per FRAX score)  Completed    Shingles vaccine  Completed    Pneumococcal 65+ years Vaccine  Completed    Hepatitis C screen  Completed    Hepatitis A vaccine  Aged Out    Hepatitis B vaccine  Aged Out    Hib vaccine  Aged Out    Polio vaccine  Aged Out    Meningococcal (ACWY) vaccine  Aged Out    Depression Screen  Discontinued    Pneumococcal 0-64 years Vaccine  Discontinued    Cervical cancer screen  Discontinued         Current Outpatient Medications:     sertraline (ZOLOFT) 50 MG tablet, Take 1 tablet by mouth daily, Disp: 90 tablet, Rfl: 1    albuterol sulfate HFA (VENTOLIN HFA) 108 (90 Base) MCG/ACT inhaler, Inhale 2 puffs into the lungs 4 times daily as needed for Wheezing, Disp: 18 g, Rfl: 0    alendronate (FOSAMAX) 70 MG tablet, Take 1 tablet by mouth every 7 days, Disp: 4 tablet, Rfl: 3    levothyroxine (SYNTHROID) 75 MCG tablet, Take 1 tablet by mouth every morning (before breakfast), Disp: 90 tablet, Rfl: 1    cyclobenzaprine (FLEXERIL) 10 MG tablet, Take 1 tablet by mouth daily, Disp: 30 tablet, Rfl: 1    Cholecalciferol (VITAMIN D3) 50 MCG (2000 UT) CAPS, Take 1 capsule by mouth daily (Patient not taking: Reported on 10/03/2022),  Disp: 30 capsule, Rfl: 3    Multiple Vitamins-Minerals (CULTURELLE PROBIOTICS + MULTIV) CHEW, Take by mouth (Patient not taking: Reported on 10/03/2022), Disp: , Rfl:     budesonide-formoterol (SYMBICORT) 160-4.5 MCG/ACT AERO, Inhale 2 puffs into the lungs 2 times daily (Patient not taking: Reported on 10/03/2022), Disp: 30.6 g, Rfl: 1    aspirin 81 MG chewable tablet, Take by mouth daily (Patient not taking: Reported on 10/03/2022), Disp: , Rfl:     Review of Systems   Constitutional: Negative.    HENT: Negative.     Eyes: Negative.    Respiratory: Negative.     Cardiovascular: Negative.    Gastrointestinal: Negative.    Endocrine: Negative.     Genitourinary: Negative.    Musculoskeletal: Negative.    Skin: Negative.    Allergic/Immunologic: Negative.    Neurological: Negative.    Hematological: Negative.    Psychiatric/Behavioral: Negative.          Vitals:    10/03/22 0805   BP: 124/70   Pulse: 92   SpO2: 95%        Physical Exam  Constitutional:       Appearance: Normal appearance.   HENT:      Head: Normocephalic.      Nose: Nose normal.   Eyes:      Extraocular Movements: Extraocular movements intact.      Pupils: Pupils are equal, round, and reactive to light.   Cardiovascular:      Rate and Rhythm: Normal rate and regular rhythm.   Pulmonary:      Effort: Pulmonary effort is normal.      Breath sounds: Normal breath sounds.   Abdominal:      General: Abdomen is flat.      Palpations: Abdomen is soft.   Musculoskeletal:         General: Normal range of motion.      Cervical back: Normal range of motion and neck supple.   Skin:     General: Skin is warm and dry.   Neurological:      General: No focal deficit present.      Mental Status: She is alert and oriented to person, place, and time.   Psychiatric:         Mood and Affect: Mood normal.         Behavior: Behavior normal.                03/28/2022     8:01 AM 01/29/2022    11:01 AM 12/22/2021     7:38 PM 08/14/2021     9:38 PM 04/04/2021     4:02 PM 03/23/2021     3:34 PM 01/09/2021     3:47 PM   PHQ Scores   PHQ2 Score 0 0 0 0 0 0 0   PHQ9 Score 0 0 0 0 0 0 0        Assessment & Plan:    1. Depression, unspecified depression type  stable  - sertraline (ZOLOFT) 50 MG tablet; Take 1 tablet by mouth daily  Dispense: 90 tablet; Refill: 1    2. Elevated glucose  A1c=5.1; stable  - AMB POC HEMOGLOBIN A1C    3. Chronic bronchitis, unspecified chronic bronchitis type (HCC)  stable  - albuterol sulfate HFA (VENTOLIN HFA) 108 (90 Base) MCG/ACT inhaler; Inhale 2 puffs into the lungs 4 times daily as needed for Wheezing  Dispense: 18 g; Refill: 0    4. Osteoporosis,  unspecified osteoporosis type, unspecified  pathological fracture presence  stable  - alendronate (FOSAMAX) 70 MG tablet; Take 1 tablet by mouth every 7 days  Dispense: 4 tablet; Refill: 3    5. Hypothyroidism, unspecified type  Stable; continue med  - levothyroxine (SYNTHROID) 75 MCG tablet; Take 1 tablet by mouth every morning (before breakfast)  Dispense: 90 tablet; Refill: 1  - TSH; Future    6. Back muscle spasm  stable  - cyclobenzaprine (FLEXERIL) 10 MG tablet; Take 1 tablet by mouth daily  Dispense: 30 tablet; Refill: 1    7. Primary hypertension  stable  - Comprehensive Metabolic Panel; Future  - CBC with Auto Differential; Future    8. Mixed hyperlipidemia  - Lipid Panel; Future    9. Encounter for immunization  - Tdap, BOOSTRIX, (age 4 yrs+), IM      Greater than 50% counseling and/or coordination of care: the treatment regimen is extensive; detailed review. Will notify of labs. Recheck in 3 months. This note was dictated using dragon voice recognition software.  It has been proofread, but there may still exist voice recognition errors that the author did not detect.      Signed By: Erskine Squibb, APRN - CNP     October 03, 2022

## 2022-10-03 NOTE — Other (Signed)
Results to patient please.  TSH is slightly elevated; however, I do not feel that we need to increase med at this point.  Repeat TSH in about 6 weeks.  Total cholesterol 200, triglycerides 130, HDL is 56, and LDL is 118.  Sodium and K+ wnl.  Glucose 89.  AST slightly elevated; however, has been.  White blood cell count is slightly decreased.  Thanks

## 2022-10-21 ENCOUNTER — Ambulatory Visit: Admit: 2022-10-21 | Payer: MEDICARE | Attending: Family | Primary: Nurse Practitioner

## 2022-10-21 DIAGNOSIS — R0989 Other specified symptoms and signs involving the circulatory and respiratory systems: Secondary | ICD-10-CM

## 2022-10-21 LAB — AMB POC COVID-19 COV: SARS-COV-2 RNA, POC: NEGATIVE

## 2022-10-21 LAB — AMB POC RAPID INFLUENZA TEST: Valid Internal Control, POC: NEGATIVE

## 2022-10-21 MED ORDER — FLUTICASONE PROPIONATE 50 MCG/ACT NA SUSP
50 MCG/ACT | Freq: Every day | NASAL | 0 refills | Status: DC
Start: 2022-10-21 — End: 2023-05-27

## 2022-10-21 MED ORDER — BENZONATATE 100 MG PO CAPS
100 | ORAL_CAPSULE | Freq: Every evening | ORAL | 0 refills | Status: AC | PRN
Start: 2022-10-21 — End: 2022-10-28

## 2022-10-21 NOTE — Patient Instructions (Addendum)
Symptoms are consistent with viral URI.  These type of infections typically resolve within 1-2 weeks.  No antibiotics are needed and will not be helpful. Recommend supportive care of humidifier, honey, mucinex, tylenol, ibuprofen, rest and increased fluid intake.  Advised to call for fever, worsening shortness of breath, chest pain, worsening persistent sinus pain or severe ear pain. Cough may linger for several weeks.  Advised to call if symptoms worsen.   Restart Symbicort.

## 2022-10-21 NOTE — Progress Notes (Unsigned)
Sandra Harmon is a 67 y.o. female who presents today for the following:  Chief Complaint   Patient presents with    Cough    Other     Deep cough with pain started Friday got worst over the weekend. Taking OTC cough and cold and robitussin dm.    Nausea    Headache     In front of head.     Fatigue       Allergies   Allergen Reactions    Atorvastatin Other (See Comments)     Muscle pain    Hydrocodone-Acetaminophen Itching    Penicillins Hives       Current Outpatient Medications   Medication Sig Dispense Refill    fluticasone (FLONASE) 50 MCG/ACT nasal spray 2 sprays by Each Nostril route daily 16 g 0    benzonatate (TESSALON) 100 MG capsule Take 1-2 capsules by mouth nightly as needed for Cough 14 capsule 0    sertraline (ZOLOFT) 50 MG tablet Take 1 tablet by mouth daily 90 tablet 1    albuterol sulfate HFA (VENTOLIN HFA) 108 (90 Base) MCG/ACT inhaler Inhale 2 puffs into the lungs 4 times daily as needed for Wheezing 18 g 0    levothyroxine (SYNTHROID) 75 MCG tablet Take 1 tablet by mouth every morning (before breakfast) 90 tablet 1    cyclobenzaprine (FLEXERIL) 10 MG tablet Take 1 tablet by mouth daily 30 tablet 1    alendronate (FOSAMAX) 70 MG tablet Take 1 tablet by mouth every 7 days (Patient not taking: Reported on 10/21/2022) 4 tablet 3    Cholecalciferol (VITAMIN D3) 50 MCG (2000 UT) CAPS Take 1 capsule by mouth daily (Patient not taking: Reported on 10/03/2022) 30 capsule 3    Multiple Vitamins-Minerals (CULTURELLE PROBIOTICS + MULTIV) CHEW Take by mouth (Patient not taking: Reported on 10/03/2022)      budesonide-formoterol (SYMBICORT) 160-4.5 MCG/ACT AERO Inhale 2 puffs into the lungs 2 times daily (Patient not taking: Reported on 10/03/2022) 30.6 g 1    aspirin 81 MG chewable tablet Take by mouth daily (Patient not taking: Reported on 10/03/2022)       No current facility-administered medications for this visit.       Past Medical History:   Diagnosis Date    Arthritis     Chronic back pain      Hypertension     Hyperthyroidism     Thyroid disease     hypothyroid       Past Surgical History:   Procedure Laterality Date    APPENDECTOMY      CERVICAL DISCECTOMY  09/16/06    Anterior C5-C6 diskectomy with decompression;  anterior cervical arthrodesis interbody technique;  anterior cervical instrumentation;  tricortical iliac crest bone graft harvested through a separate incision.     CHOLECYSTECTOMY      TAH AND BSO (CERVIX REMOVED)      TUBAL LIGATION         Social History     Tobacco Use    Smoking status: Former     Current packs/day: 0.00     Average packs/day: 1 pack/day for 17.7 years (17.7 ttl pk-yrs)     Types: Cigarettes     Start date: 65     Quit date: 10/25/1996     Years since quitting: 26.0     Passive exposure: Never    Smokeless tobacco: Former   Substance Use Topics    Alcohol use: No  Family History   Problem Relation Age of Onset    Asthma Mother     Lung Disease Mother     High Blood Pressure Mother     Liver Disease Father     Alcohol Abuse Father     Early Death Sister     Stroke Brother     Diabetes Brother     Alcohol Abuse Brother     Early Death Brother     Heart Attack Brother     High Blood Pressure Brother     Substance Abuse Brother     Alcohol Abuse Brother     Diabetes Brother     High Blood Pressure Brother     Substance Abuse Brother     Breast Cancer Neg Hx        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  Albuterol since Saturday using few times a day.  Coughing sometimes at night.  Every few minutes coughs.      Cough  This is a new problem. The cough is Non-productive. Associated symptoms include chills, ear pain, headaches, postnasal drip and shortness of breath. Pertinent negatives include no fever, heartburn, hemoptysis, rhinorrhea, sore throat or wheezing. Nothing aggravates the symptoms. She has tried a beta-agonist inhaler and OTC cough suppressant for the symptoms.   Headache  Fatigue  Associated symptoms include chills, coughing, fatigue and headaches. Pertinent negatives  include no congestion, fever, sore throat or weakness.        Review of Systems   Constitutional:  Positive for chills and fatigue. Negative for fever.   HENT:  Positive for ear pain and postnasal drip. Negative for congestion, ear discharge, rhinorrhea, sinus pressure, sinus pain, sneezing and sore throat.         Left ear   Respiratory:  Positive for cough and shortness of breath. Negative for hemoptysis and wheezing.         Mid-sternal chest pain each time coughs.  Fell in March and hurts in that spot.  Dry cough.  No hx of pneumonia.  No hospitalizations.    Hx spot on lung for CT scan in October.  COVID-19 lung problems.   Cardiovascular:  Negative for palpitations and leg swelling.   Gastrointestinal:  Negative for heartburn.   Neurological:  Positive for headaches. Negative for dizziness and weakness.        BP 128/84 (Site: Right Upper Arm, Position: Sitting)   Pulse 97   Temp 98.1 F (36.7 C)   Ht 1.524 m (5')   Wt 78 kg (172 lb)   SpO2 96%   BMI 33.59 kg/m     Physical Exam  Constitutional:       General: She is not in acute distress.     Appearance: Normal appearance. She is obese. She is not ill-appearing, toxic-appearing or diaphoretic.   HENT:      Head: Normocephalic and atraumatic.      Right Ear: Tympanic membrane, ear canal and external ear normal.      Left Ear: Tympanic membrane, ear canal and external ear normal.      Nose: Congestion present.      Mouth/Throat:      Mouth: Mucous membranes are moist.      Pharynx: Oropharynx is clear.   Eyes:      Extraocular Movements: Extraocular movements intact.      Conjunctiva/sclera: Conjunctivae normal.      Pupils: Pupils are equal, round, and reactive to light.   Cardiovascular:  Rate and Rhythm: Normal rate and regular rhythm.      Pulses: Normal pulses.      Heart sounds: Normal heart sounds.   Pulmonary:      Effort: Pulmonary effort is normal. No respiratory distress.      Breath sounds: No wheezing, rhonchi or rales.      Comments:  Mid lung on right laterally mild diminished otherwise, clear anterior-posteriorly bilaterally.  Chest:      Chest wall: No tenderness.   Abdominal:      General: There is no distension.   Musculoskeletal:         General: Normal range of motion.      Cervical back: Normal range of motion and neck supple.      Right lower leg: No edema.      Left lower leg: No edema.   Lymphadenopathy:      Cervical: No cervical adenopathy.   Skin:     General: Skin is warm and dry.      Coloration: Skin is not jaundiced or pale.   Neurological:      General: No focal deficit present.      Mental Status: She is alert and oriented to person, place, and time.   Psychiatric:         Mood and Affect: Mood normal.         Behavior: Behavior normal.         Thought Content: Thought content normal.         Judgment: Judgment normal.          1. Respiratory symptoms  -     AMB POC COVID-19 COV  -     AMB POC RAPID INFLUENZA TEST  2. Acute cough  -     XR CHEST PA LAT (2 VIEWS); Future  -     benzonatate (TESSALON) 100 MG capsule; Take 1-2 capsules by mouth nightly as needed for Cough, Disp-14 capsule, R-0Normal  3. Shortness of breath  -     XR CHEST PA LAT (2 VIEWS); Future  4. Acute rhinitis  -     fluticasone (FLONASE) 50 MCG/ACT nasal spray; 2 sprays by Each Nostril route daily, Disp-16 g, R-0Normal     Work excuse today and tomorrow.    Counseled patient that symptoms are consistent with viral URI.  These type of infections typically resolve within 1-2 weeks.  No antibiotics are needed and will not be helpful.  Recommend supportive care of humidifier, honey, mucinex, tylenol, ibuprofen, rest and increased fluid intake.  Advised to call for fever, worsening shortness of breath, chest pain, worsening persistent sinus pain or severe ear pain. Cough may linger for several weeks.  Advised to call if symptoms worsen.     Patient informed, we will call with blood work results within one week.  If you have not heard regarding results in over a  week, please contact office.  You can also review results on mychart.           Noel Christmas, APRN - CNP

## 2022-10-22 ENCOUNTER — Inpatient Hospital Stay: Admit: 2022-10-22 | Payer: MEDICARE | Primary: Nurse Practitioner

## 2022-10-22 DIAGNOSIS — R051 Acute cough: Secondary | ICD-10-CM

## 2022-10-22 NOTE — Result Encounter Note (Signed)
 Patient's imaging result showed:  chronic interstitial changes but no acute findings such as pneumonia or fluid on the lung.  Follow up with pulmonary as scheduled.  Adding back the Symbicort  may help her symptoms.  If worsening symptoms or severe symptoms go to ED.  If no improvement, f/u in office.  If patient has any additional questions or concerns, please let me know.

## 2022-11-07 ENCOUNTER — Telehealth
Admit: 2022-11-07 | Discharge: 2022-11-07 | Payer: MEDICARE | Attending: Nurse Practitioner | Primary: Nurse Practitioner

## 2022-11-07 DIAGNOSIS — F418 Other specified anxiety disorders: Secondary | ICD-10-CM

## 2022-11-07 MED ORDER — CYCLOBENZAPRINE HCL 10 MG PO TABS
10 | ORAL_TABLET | Freq: Every day | ORAL | 1 refills | Status: DC
Start: 2022-11-07 — End: 2023-01-07

## 2022-11-07 NOTE — Progress Notes (Signed)
 [facility]  [address]  [place], [place] [address]   (ph) [phone] (fax) [phone]  Minnie Shi L. Cothran-Pate APRN, FNP-C        67 yo female VV to discuss her mental health. She reports that she has not seen her brother in a few years and received a phone call yesterday that he had experienced an MI. This made her very upset and she is having situational anxiety. She does not feel depressed; however, feels that she needs to see someone for counseling to see what is the best way to deal with situation. She did not go into great detail about the past or what led up to the end of the relationship with her brother. She only asked for a referral to behavioral health. She also needs a refill on Flexeril  that she takes for muscle spasms.         Allergies   Allergen Reactions    Atorvastatin Other (See Comments)     Muscle pain    Hydrocodone-Acetaminophen  Itching    Penicillins Hives       Past Medical History:   Diagnosis Date    Arthritis     Chronic back pain     Hypertension     Hyperthyroidism     Thyroid disease     hypothyroid       Family History   Problem Relation Age of Onset    Asthma Mother     Lung Disease Mother     High Blood Pressure Mother     Liver Disease Father     Alcohol Abuse Father     Early Death Sister     Stroke Brother     Diabetes Brother     Alcohol Abuse Brother     Early Death Brother     Heart Attack Brother     High Blood Pressure Brother     Substance Abuse Brother     Alcohol Abuse Brother     Diabetes Brother     High Blood Pressure Brother     Substance Abuse Brother     Breast Cancer Neg Hx        Social History     Socioeconomic History    Marital status: Divorced     Spouse name: Not on file    Number of children: Not on file    Years of education: Not on file    Highest education level: Not on file   Occupational History    Not on file   Tobacco Use    Smoking status: Former     Current packs/day: 0.00     Average packs/day: 1  pack/day for 17.7 years (17.7 ttl pk-yrs)     Types: Cigarettes     Start date: 68     Quit date: 10/25/1996     Years since quitting: 26.0     Passive exposure: Never    Smokeless tobacco: Former   Substance and Sexual Activity    Alcohol use: No    Drug use: No    Sexual activity: Not Currently     Partners: Male   Other Topics Concern    Not on file   Social History Narrative    Not on file     Social Determinants of Health     Financial Resource Strain: Low Risk  (06/20/2022)    Overall Financial Resource Strain (CARDIA)     Difficulty of Paying Living  Expenses: Not hard at all   Food Insecurity: No Food Insecurity (06/20/2022)    Hunger Vital Sign     Worried About Running Out of Food in the Last Year: Never true     Ran Out of Food in the Last Year: Never true   Transportation Needs: Unknown (06/20/2022)    PRAPARE - Therapist, Art (Medical): Not on file     Lack of Transportation (Non-Medical): No   Physical Activity: Unknown (12/22/2021)    Exercise Vital Sign     Days of Exercise per Week: 0 days     Minutes of Exercise per Session: Not on file   Stress: Not on file   Social Connections: Not on file   Intimate Partner Violence: Not on file   Housing Stability: Unknown (06/20/2022)    Housing Stability Vital Sign     Unable to Pay for Housing in the Last Year: Not on file     Number of Places Lived in the Last Year: Not on file     Unstable Housing in the Last Year: No       OB History    No obstetric history on file.         Past Surgical History:   Procedure Laterality Date    APPENDECTOMY      CERVICAL DISCECTOMY  09/16/06    Anterior C5-C6 diskectomy with decompression;  anterior cervical arthrodesis interbody technique;  anterior cervical instrumentation;  tricortical iliac crest bone graft harvested through a separate incision.     CHOLECYSTECTOMY      TAH AND BSO (CERVIX REMOVED)      TUBAL LIGATION         Health Maintenance   Topic Date Due    HIV screen  Never done     Respiratory Syncytial Virus (RSV) Pregnant or age 21 yrs+ (1 - 1-dose 60+ series) Never done    Annual Wellness Visit (Medicare Advantage)  02/25/2022    Flu vaccine (1) 09/26/2022    COVID-19 Vaccine (5 - 2023-24 season) 10/27/2022    Breast cancer screen  09/05/2023    Depression Monitoring  10/21/2023    Colorectal Cancer Screen  11/27/2023    Lipids  10/03/2027    DTaP/Tdap/Td vaccine (3 - Td or Tdap) 10/02/2032    DEXA (modify frequency per FRAX score)  Completed    Shingles vaccine  Completed    Pneumococcal 65+ years Vaccine  Completed    Hepatitis C screen  Completed    Hepatitis A vaccine  Aged Out    Hepatitis B vaccine  Aged Out    Hib vaccine  Aged Out    Polio vaccine  Aged Out    Meningococcal (ACWY) vaccine  Aged Out    Depression Screen  Discontinued    Pneumococcal 0-64 years Vaccine  Discontinued    Diabetes screen  Discontinued    Cervical cancer screen  Discontinued         Current Outpatient Medications:     sertraline  (ZOLOFT ) 50 MG tablet, Take 1 tablet by mouth daily, Disp: 90 tablet, Rfl: 1    albuterol  sulfate HFA (VENTOLIN  HFA) 108 (90 Base) MCG/ACT inhaler, Inhale 2 puffs into the lungs 4 times daily as needed for Wheezing, Disp: 18 g, Rfl: 0    levothyroxine  (SYNTHROID ) 75 MCG tablet, Take 1 tablet by mouth every morning (before breakfast), Disp: 90 tablet, Rfl: 1    cyclobenzaprine  (FLEXERIL ) 10 MG tablet, Take  1 tablet by mouth daily, Disp: 30 tablet, Rfl: 1    budesonide -formoterol  (SYMBICORT ) 160-4.5 MCG/ACT AERO, Inhale 2 puffs into the lungs 2 times daily, Disp: 30.6 g, Rfl: 1    aspirin 81 MG chewable tablet, Take by mouth daily, Disp: , Rfl:     fluticasone  (FLONASE ) 50 MCG/ACT nasal spray, 2 sprays by Each Nostril route daily (Patient not taking: Reported on 11/07/2022), Disp: 16 g, Rfl: 0    alendronate  (FOSAMAX ) 70 MG tablet, Take 1 tablet by mouth every 7 days (Patient not taking: Reported on 10/21/2022), Disp: 4 tablet, Rfl: 3    Review of Systems   Constitutional: Negative.     HENT: Negative.     Eyes: Negative.    Respiratory: Negative.     Cardiovascular: Negative.    Gastrointestinal: Negative.    Endocrine: Negative.    Genitourinary: Negative.    Musculoskeletal: Negative.    Skin: Negative.    Allergic/Immunologic: Negative.    Neurological: Negative.    Hematological: Negative.    Psychiatric/Behavioral:  The patient is nervous/anxious.         There were no vitals filed for this visit.     Physical Exam  Constitutional:       Appearance: Normal appearance.   HENT:      Head: Normocephalic.   Musculoskeletal:         General: Normal range of motion.   Neurological:      General: No focal deficit present.      Mental Status: She is alert and oriented to person, place, and time.   Psychiatric:         Mood and Affect: Mood normal.         Behavior: Behavior normal.                Assessment & Plan:    1. Back muscle spasms  stable  - cyclobenzaprine  (FLEXERIL ) 10 MG tablet; Take 1 tablet by mouth daily  Dispense: 30 tablet; Refill: 1    2. Hypothyroidism, unspecified type    - TSH with Reflex; Future    3. Leukopenia, unspecified type    - CBC with Auto Differential; Future    4. Situational anxiety  She refuses meds and just feels that she needs counseling.   - [facility] - [address] [place] (Counseling)       Greater than 50% counseling and/or coordination of care.  This note was dictated using dragon voice recognition software.  It has been proofread, but there may still exist voice recognition errors that the author did not detect.      Xzaria Teo was evaluated through a synchronous (real-time) audio-video encounter. The patient (or guardian if applicable) is aware that this is a billable service, which includes applicable co-pays. This Virtual Visit was conducted with patient's (and/or legal guardian's) consent. Patient identification was verified, and a caregiver was present when appropriate.   The patient was located at Home: [address]  [place] [address]  Provider was located at [facility] (Appt Dept): [address]  [place],  [place] [address]            Signed By: Grayce LITTIE Conine, APRN - CNP     November 07, 2022

## 2022-11-11 ENCOUNTER — Encounter: Payer: MEDICARE | Primary: Nurse Practitioner

## 2022-11-21 ENCOUNTER — Encounter

## 2022-11-21 LAB — CBC WITH AUTO DIFFERENTIAL
Basophils %: 1 % (ref 0.0–2.0)
Basophils Absolute: 0.1 10*3/uL (ref 0.0–0.2)
Eosinophils %: 5 % (ref 0.5–7.8)
Eosinophils Absolute: 0.3 10*3/uL (ref 0.0–0.8)
Hematocrit: 42.1 % (ref 35.8–46.3)
Hemoglobin: 13.8 g/dL (ref 11.7–15.4)
Immature Granulocytes %: 0 % (ref 0.0–5.0)
Immature Granulocytes Absolute: 0 10*3/uL (ref 0.0–0.5)
Lymphocytes %: 40 % (ref 13–44)
Lymphocytes Absolute: 2.3 10*3/uL (ref 0.5–4.6)
MCH: 31.1 pg (ref 26.1–32.9)
MCHC: 32.8 g/dL (ref 31.4–35.0)
MCV: 94.8 FL (ref 82–102)
MPV: 10.3 FL (ref 9.4–12.3)
Monocytes %: 8 % (ref 4.0–12.0)
Monocytes Absolute: 0.5 10*3/uL (ref 0.1–1.3)
Neutrophils %: 46 % (ref 43–78)
Neutrophils Absolute: 2.8 10*3/uL (ref 1.7–8.2)
Platelets: 234 10*3/uL (ref 150–450)
RBC: 4.44 M/uL (ref 4.05–5.2)
RDW: 13 % (ref 11.9–14.6)
WBC: 5.9 10*3/uL (ref 4.3–11.1)
nRBC: 0 10*3/uL (ref 0.0–0.2)

## 2022-11-22 LAB — TSH REFLEX TO FT4: TSH w Free Thyroid if Abnormal: 2.52 u[IU]/mL (ref 0.27–4.20)

## 2022-11-23 ENCOUNTER — Ambulatory Visit: Payer: MEDICARE | Primary: Nurse Practitioner

## 2022-11-25 NOTE — Other (Signed)
 Results to patient please.  TSH is within normal limits.  Hemoglobin is 13.8.  Thanks

## 2022-12-04 NOTE — Telephone Encounter (Signed)
FYI apt 11/5  Son Susy Frizzle called wanted to give Sandra Harmon a heads up on what he would like to discuss at the next visit. He has some concerns for patients cognitive skills.  Pt has some shakes and very forgetful

## 2022-12-06 NOTE — Telephone Encounter (Signed)
Spoke to pt she is off balance, hands shaking and forgetfulness.

## 2022-12-10 ENCOUNTER — Inpatient Hospital Stay: Admit: 2022-12-10 | Payer: MEDICARE | Attending: Critical Care Medicine | Primary: Nurse Practitioner

## 2022-12-10 DIAGNOSIS — R911 Solitary pulmonary nodule: Secondary | ICD-10-CM

## 2022-12-12 NOTE — Telephone Encounter (Signed)
Left vm to see if pt wants sooner appt

## 2022-12-12 NOTE — Telephone Encounter (Signed)
Pt will wait until visit

## 2022-12-27 ENCOUNTER — Encounter
Admit: 2022-12-27 | Discharge: 2022-12-27 | Payer: MEDICARE | Attending: Critical Care Medicine | Primary: Nurse Practitioner

## 2022-12-27 DIAGNOSIS — R051 Acute cough: Secondary | ICD-10-CM

## 2022-12-27 MED ORDER — ALBUTEROL SULFATE HFA 108 (90 BASE) MCG/ACT IN AERS
108 (90 Base) MCG/ACT | Freq: Four times a day (QID) | RESPIRATORY_TRACT | 0 refills | Status: DC | PRN
Start: 2022-12-27 — End: 2023-01-11

## 2022-12-27 MED ORDER — BUDESONIDE-FORMOTEROL FUMARATE 160-4.5 MCG/ACT IN AERO
160-4.5 MCG/ACT | Freq: Two times a day (BID) | RESPIRATORY_TRACT | 1 refills | Status: DC
Start: 2022-12-27 — End: 2023-05-27

## 2022-12-27 MED ORDER — AZITHROMYCIN 250 MG PO TABS
250 MG | ORAL_TABLET | ORAL | 0 refills | Status: DC
Start: 2022-12-27 — End: 2023-04-01

## 2022-12-27 NOTE — Progress Notes (Signed)
Name:  Sandra Harmon  Date of Birth:  1955-12-08   MRN: [id number]      Office Visit: 12/27/2022        ASSESSMENT AND PLAN:  (Medical Decision Making)    Impression: 67 y.o. female with ankylosing spondylitis, obesity, hypertension, hypothyroidism with worsening shortness of breath and cough post COVID 4 months ago.      ICD-10-CM    1. Acute cough  R05.1 albuterol sulfate HFA (VENTOLIN HFA) 108 (90 Base) MCG/ACT inhaler     budesonide-formoterol (SYMBICORT) 160-4.5 MCG/ACT AERO     azithromycin (ZITHROMAX) 250 MG tablet      2. SOB (shortness of breath)  R06.02 albuterol sulfate HFA (VENTOLIN HFA) 108 (90 Base) MCG/ACT inhaler     budesonide-formoterol (SYMBICORT) 160-4.5 MCG/ACT AERO      3. Post covid-19 condition, unspecified  U09.9       4. Pulmonary nodule  R91.1 CT CHEST WO CONTRAST         Assessment & Plan  1. Walking pneumonia.  The patient has been experiencing a new cough for a little over a week, especially at night, with green or yellow sputum. 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 She reports chills but no fever or night sweats. Physical examination reveals crackling in the lungs. A Z-Pak will be initiated today for the pneumonia cough. She is advised to continue using her Symbicort and albuterol inhalers as needed. If symptoms persist or worsen, further evaluation will be considered.    2. Pulmonary nodule.  A recent CT scan from over two weeks ago showed stable findings with no new or worsening pulmonary nodules or structural lung abnormalities. A follow-up CT scan will be scheduled in a year to monitor the nodule, unless an earlier scan is deemed necessary.    3. Shortness of breath.  She reports variable oxygen saturation levels, ranging from 95-99% but occasionally dropping to 92-93%. She is advised to monitor her oxygen levels and use her inhalers as needed. If shortness of breath worsens, further evaluation will be considered.     No  orders of the defined types were placed in this encounter.  Follow-up and Dispositions    Return in about 1 year (around 12/27/2023) for After CT, Dr. Dayton Martes or NP Coutu.       Floreen Comber, MD    No specialty comments available.  No problem-specific Assessment & Plan notes found for this encounter.         The patient (or guardian, if applicable) and other individuals in attendance with the patient were advised that Artificial Intelligence will be utilized during this visit to record, process the conversation to generate a clinical note, and support improvement of the AI technology. The patient (or guardian, if applicable) and other individuals in attendance at the appointment consented to the use of AI, including the recording.    _________________________________________________________________________    HISTORY OF PRESENT ILLNESS:    Ms. Sandra Harmon is a 67 y.o. female who is seen at [facility] today for  Shortness of Breath     History of Present Illness  The patient is a 67 year old female here for follow-up on chronic shortness of breath, post COVID-19, and a pulmonary nodule. A recent CT scan shows the nodule is stable with no worsening or structural lung abnormalities.    She reports a severe cough, particularly at night, which has been present for over a week. The intensity of the  cough causes soreness. She produces green or yellow phlegm when coughing at night. Her oxygen saturation levels typically range between 95 and 99, but have recently dropped to 92 or 93. She uses Symbicort, which she finds helpful, and albuterol as needed, typically a few times a day.    She mentions a recent incident where she was exposed to numerous smokers in [place], resulting in a breathing attack. She works at a preschool, which has exposed her to children diagnosed with walking pneumonia. She reports no fevers or night sweats but does experience chills. There is no swelling, rashes, lumps, or  bumps.    Additionally, she has noticed some hand shaking and her balance is not good. She also experiences short-term memory loss.    REVIEW OF SYSTEMS: 10 point review of systems is negative except as reported in HPI.    PHYSICAL EXAM: Body mass index is 35.35 kg/m.  Vitals:    12/27/22 0934   BP: 120/80   Pulse: 85   Resp: 20   Temp: 98 F (36.7 C)   TempSrc: Temporal   SpO2: 95%   Weight: 82.1 kg (181 lb)   Height: 1.524 m (5')         General:   Alert, cooperative, no distress, appears stated age.        Eyes:   Conjunctivae/corneas clear. PERRL        Mouth/Throat:  Lips, mucosa, and tongue normal. Teeth and gums normal.        Lungs:   Mild opening crackles on exam bilaterally, no wheeze     Heart:   Regular rate and rhythm, S1, S2 normal, no murmur, click, rub or gallop.     Abdomen:    Soft, non-tender.     Extremities:  Extremities normal, atraumatic, no cyanosis or edema.     Skin:  Skin color normal. No rashes or lesions     Neurologic:  A&Ox3     DIAGNOSTIC TESTS:                                                                                    LABS:   Lab Results   Component Value Date/Time    WBC 5.9 11/21/2022 03:29 PM    HGB 13.8 11/21/2022 03:29 PM    HCT 42.1 11/21/2022 03:29 PM    PLT 234 11/21/2022 03:29 PM    TSH 4.330 10/03/2022 08:47 AM    ANA Negative 05/03/2022 01:55 PM    RF Negative 05/03/2022 01:55 PM    CRP 0.6 05/03/2022 01:55 PM     Imaging: I performed an independent interpretation of the patient's images.  CXR: clear chest, no change compared to 2012.    XR CHEST STANDARD TWO VW 10/22/2022    Narrative  EXAMINATION: XR CHEST (2 VW)    EXAM DATE: 10/22/2022 10:40 AM    INDICATION: Acute cough; Shortness of breath    COMPARISON: May 01, 2022    TECHNIQUE:  Frontal and lateral views of the chest.    FINDINGS:    Diffuse interstitial prominence is present. No focal consolidation. No pleural  effusion or pneumothorax.    The cardiomediastinal  silhouette is unremarkable.    No acute  osseous or soft tissue abnormality. Cervical fusion hardware partially  visualized.    Impression  1.  No acute cardiopulmonary process.    2.  Mild chronic interstitial lung changes.      Electronically signed by Jones Bales    CT Chest: 11/2022      CT CHEST WO CONTRAST 12/10/2022    Narrative  CT CHEST WO CONTRAST - 12/10/2022 5:15 PM - [id number]    INDICATION: Pulmonary nodules seen on prior study with recommendation for  follow-up imaging. Solitary pulmonary nodule.    COMPARISON: 05/01/2022 CT chest without contrast.    TECHNIQUE:  Multiple axial images were obtained through the chest without IV contrast.  Lack  of IV contrast decreases test sensitivity and accuracy for vascular and solid  visceral evaluation.  Radiation dose reduction techniques were used for this  study: All CT scans performed at this facility use one or all of the following:  Automated exposure control, adjustment of the mA and/or kVp according to  patient's size, iterative reconstruction.    FINDINGS:  Lungs: Unchanged bilateral scattered pulmonary nodules measuring up to 7 mm.  Unchanged mild chronic interstitial lung disease. Unchanged mild diffuse  varicoid bronchiectasis. Mild bilateral scar seen.    Vasculature:  Aorta mild calcific atherosclerotic disease.   No coronary artery calcification  seen.    Mediastinum/Hilum: Negative    Superior abdomen: No solid mass seen.    Osseous structures: Spine osteophytes seen. Partially seen cervical spine  fusion.    Impression  Unchanged bilateral subcentimeter pulmonary nodules compared to previous study.  Recommend CT chest without contrast in 12 to 18 months for continued  surveillance.    Chronic findings.      Electronically signed by Meade Maw    Nuclear Medicine: No results found for this or any previous visit from the past 3650 days.    PFTs: Mild restrictive pattern, different effort with FEV1 in 70s.       Latest Ref Rng & Units 04/22/2022     8:21 AM   Office Spirometry  Results   FVC L 1.47    FEV1 L 1.26    FEV1 %Pred-Pre % 64    FVC %Pred-Pre % 59    FEV1/FVC % 86      Results for orders placed or performed in visit on 05/22/22   AMB POC SPIROMETRY W/BRONCHODILATOR   Result Value Ref Range    FEV 1 , POC 1.45 L    FEV1 % Pred POC 76 %    FVC, POC 1.66     FVC % Pred POC 70 %    FEV1/FVC, POC 87     No results found for this or any previous visit.    FeNO: No results found for this or any previous visit.  FeNO and Likelihood of Eosinophilic Asthma   Unlikely Intermediate Likely   <25 ppb 25-50 ppb >50ppb     Exercise Oximetry:    Echo:   TRANSTHORACIC ECHOCARDIOGRAM (TTE) COMPLETE (CONTRAST/BUBBLE/3D PRN) 01/10/2021    Interpretation Summary    Left Ventricle: Hyperdynamic left ventricular systolic function with a visually estimated EF of 70 - 75%. Left ventricle size is normal. Mildly increased wall thickness. Findings consistent with mild concentric hypertrophy. Normal wall motion. Normal diastolic function for age. Average E/e' ratio is 7.36.    PMH Reference Info:  Immunization History   Administered Date(s) Administered    COVID-19, MODERNA BLUE border, Primary or Immunocompromised, (age 12y+), IM, 100 mcg/0.75mL 12/17/2019    COVID-19, PFIZER PURPLE top, DILUTE for use, (age 723 y+), 3mcg/0.3mL 05/05/2019, 05/26/2019, 12/17/2019    Influenza Virus Vaccine 11/30/2018, 11/23/2019    Influenza, FLUAD, (age 54 y+), IM, Quadv, 0.16mL 12/25/2021    Influenza, FLUARIX, FLULAVAL, FLUZONE (age 722 mo+) and AFLURIA, (age 58 y+), Quadv PF, 0.29mL 11/23/2019    Influenza, FLUCELVAX, (age 72 mo+), MDCK, Quadv MDV, 0.74mL 12/16/2020    Influenza, FLUCELVAX, (age 72 mo+), MDCK, Quadv PF, 0.58mL 03/03/2018, 03/04/2018, 11/28/2018    Pneumococcal, PCV20, PREVNAR 20, (age 72w+), IM, 0.71mL 12/25/2021    TDaP, ADACEL (age 78y-64y), Leda Min (age 10y+), IM, 0.27mL 09/25/2012, 10/03/2022    Zoster  Recombinant (Shingrix) 09/16/2018, 11/17/2018     Past Medical History:   Diagnosis Date    Arthritis     Chronic back pain     Hypertension     Hyperthyroidism     Thyroid disease     hypothyroid        Tobacco Use      Smoking status: Former        Packs/day: 0.00        Years: 1 pack/day for 17.7 years (17.7 ttl pk-yrs)        Types: Cigarettes        Start date: 32        Quit date: 10/25/1996        Years since quitting: 26.1        Passive exposure: Never      Smokeless tobacco: Former    Allergies   Allergen Reactions    Atorvastatin Other (See Comments)     Muscle pain    Hydrocodone-Acetaminophen Itching    Penicillins Hives     Current Outpatient Medications   Medication Instructions    albuterol sulfate HFA (VENTOLIN HFA) 108 (90 Base) MCG/ACT inhaler 2 puffs, Inhalation, 4 TIMES DAILY PRN    alendronate (FOSAMAX) 70 mg, Oral, EVERY 7 DAYS    aspirin 81 MG chewable tablet Oral, DAILY    budesonide-formoterol (SYMBICORT) 160-4.5 MCG/ACT AERO 2 puffs, Inhalation, 2 TIMES DAILY    cyclobenzaprine (FLEXERIL) 10 mg, Oral, DAILY    fluticasone (FLONASE) 50 MCG/ACT nasal spray 2 sprays, Each Nostril, DAILY    levothyroxine (SYNTHROID) 75 mcg, Oral, DAILY BEFORE BREAKFAST    sertraline (ZOLOFT) 50 mg, Oral, DAILY

## 2022-12-31 ENCOUNTER — Encounter: Payer: MEDICARE | Attending: Clinical | Primary: Nurse Practitioner

## 2022-12-31 ENCOUNTER — Inpatient Hospital Stay: Admit: 2022-12-31 | Payer: MEDICARE | Primary: Nurse Practitioner

## 2022-12-31 ENCOUNTER — Ambulatory Visit
Admit: 2022-12-31 | Discharge: 2022-12-31 | Payer: MEDICARE | Attending: Nurse Practitioner | Primary: Nurse Practitioner

## 2022-12-31 DIAGNOSIS — R413 Other amnesia: Secondary | ICD-10-CM

## 2022-12-31 DIAGNOSIS — R0600 Dyspnea, unspecified: Secondary | ICD-10-CM

## 2022-12-31 LAB — CBC WITH AUTO DIFFERENTIAL
Basophils %: 1 % (ref 0.0–2.0)
Basophils Absolute: 0.1 10*3/uL (ref 0.0–0.2)
Eosinophils %: 5 % (ref 0.5–7.8)
Eosinophils Absolute: 0.2 10*3/uL (ref 0.0–0.8)
Hematocrit: 43.5 % (ref 35.8–46.3)
Hemoglobin: 14.7 g/dL (ref 11.7–15.4)
Immature Granulocytes %: 0 % (ref 0.0–5.0)
Immature Granulocytes Absolute: 0 10*3/uL (ref 0.0–0.5)
Lymphocytes %: 31 % (ref 13–44)
Lymphocytes Absolute: 1.6 10*3/uL (ref 0.5–4.6)
MCH: 31.5 pg (ref 26.1–32.9)
MCHC: 33.8 g/dL (ref 31.4–35.0)
MCV: 93.1 fL (ref 82–102)
MPV: 9.9 fL (ref 9.4–12.3)
Monocytes %: 10 % (ref 4.0–12.0)
Monocytes Absolute: 0.5 10*3/uL (ref 0.1–1.3)
Neutrophils %: 53 % (ref 43–78)
Neutrophils Absolute: 2.7 10*3/uL (ref 1.7–8.2)
Platelets: 235 10*3/uL (ref 150–450)
RBC: 4.67 M/uL (ref 4.05–5.2)
RDW: 13.5 % (ref 11.9–14.6)
WBC: 5.1 10*3/uL (ref 4.3–11.1)
nRBC: 0 10*3/uL (ref 0.0–0.2)

## 2022-12-31 LAB — COMPREHENSIVE METABOLIC PANEL
ALT: 52 U/L — ABNORMAL HIGH (ref 8–45)
AST: 78 U/L — ABNORMAL HIGH (ref 15–37)
Albumin/Globulin Ratio: 1 (ref 1.0–1.9)
Albumin: 3.4 g/dL (ref 3.2–4.6)
Alk Phosphatase: 75 U/L (ref 35–104)
Anion Gap: 11 mmol/L (ref 7–16)
BUN: 6 mg/dL — ABNORMAL LOW (ref 8–23)
CO2: 23 mmol/L (ref 20–29)
Calcium: 9.2 mg/dL (ref 8.8–10.2)
Chloride: 109 mmol/L — ABNORMAL HIGH (ref 98–107)
Creatinine: 0.65 mg/dL (ref 0.60–1.10)
Est, Glom Filt Rate: >90 mL/min/{1.73_m2} (ref >60)
Globulin: 3.5 g/dL (ref 2.3–3.5)
Glucose: 123 mg/dL — ABNORMAL HIGH (ref 70–99)
Potassium: 3.6 mmol/L (ref 3.5–5.1)
Sodium: 143 mmol/L (ref 136–145)
Total Bilirubin: 0.4 mg/dL (ref 0.0–1.2)
Total Protein: 7 g/dL (ref 6.3–8.2)

## 2022-12-31 LAB — BRAIN NATRIURETIC PEPTIDE: NT Pro-BNP: <36 pg/mL (ref 0–125)

## 2022-12-31 LAB — AMB POC HEMOGLOBIN A1C: Hemoglobin A1C, POC: 5.4 %

## 2022-12-31 MED ORDER — ALENDRONATE SODIUM 70 MG PO TABS
70 MG | ORAL_TABLET | ORAL | 3 refills | Status: AC
Start: 2022-12-31 — End: 2023-04-01

## 2022-12-31 MED ORDER — SERTRALINE HCL 50 MG PO TABS
50 MG | ORAL_TABLET | Freq: Every day | ORAL | 1 refills | Status: AC
Start: 2022-12-31 — End: 2023-04-01

## 2022-12-31 MED ORDER — PRAVASTATIN SODIUM 20 MG PO TABS
20 MG | ORAL_TABLET | Freq: Every day | ORAL | 1 refills | Status: AC
Start: 2022-12-31 — End: 2023-04-01

## 2022-12-31 MED ORDER — LEVOTHYROXINE SODIUM 75 MCG PO TABS
75 MCG | ORAL_TABLET | Freq: Every day | ORAL | 1 refills | Status: AC
Start: 2022-12-31 — End: 2023-04-01

## 2022-12-31 NOTE — Addendum Note (Signed)
Addended by: Cecilio Asper on: 12/31/2022 10:54 AM     Modules accepted: Orders

## 2022-12-31 NOTE — Other (Signed)
Results to pt please.   CXR FINDINGS: The lungs are clear. There are no infiltrates or effusions.  The heart size is normal.  The bony thorax is intact.    Metallic plate with multiple metallic surgical scars base of the neck from  previous cervical fusion.    IMPRESSION:  1. No acute findings in the chest.  2. Chest X-ray  No signs of pneumonia  Thanks

## 2022-12-31 NOTE — Progress Notes (Unsigned)
Length of meeting;     minutes    Start Time:    Stop Time:    Diagnosis:    Treatment Plan: This is pended to next session as we will plan to review treatment goals at that time; the patient is given an assignment related to goal setting in session today.    Patient Prognosis: Guarded at present time.    Patient Progress: This is an initial visit for this patient who is referred by Julaine Fusi, APRN-CNP, for situational anxiety.  The medical record is reviewed prior to this visit today.  The patient shared with the referring provider that she had not spoken with her brother in a few years and had received a phone call that he had an MI and became quite anxious; she requested to be be seen by someone.  Pt also noted to have seen one of our other practitioners in early 2023 for anxiety related to a job where she was working long hours, which subsided with a job change.     Today, she notes that      .       Mental Status exam: PHQ-9 is done with a score of      ;  GAD-7 is done with a score of    .  Both are shared with the patient.  The patient indicates their alcohol use is     , and that their drug use is   .  No current active SI or HI.  The patient agrees today that if thoughts of self-harm or harm of others begins to occur that they will call 911, or go to the nearest ER or Urgent Care Center, prior to acting on these thoughts.  Thought processes appear normal.  Speech is goal directed.  The patient does not appear to be responding to internal stimuli.      Plan: I will see this patient again on     .    Shelda Pal, LISW-CP

## 2022-12-31 NOTE — Progress Notes (Signed)
[facility]  [address]  [place], [place] [address]   (ph) [phone] (fax) [phone]  Derak Schurman L. Cothran-Pate APRN, FNP-C      Chief Complaint   Patient presents with    Other     Dx with pneumonia last Friday at pulmonologist visit with Dr. Virgina Jock. Finished z pack has  gotten worst. Coughing, after standing having dizziness,weakness, fatigued and loss of appetite. Requesting Singulair generic brand.       67 yo female comes in for her AWV. Dx with pneumonia last Friday at pulmonologist Dr. Virgina Jock. Finished Z pak today; however, no better yet. Coughing, after standing having dizziness,weakness, fatigue and loss of appetite. She is requesting an xray. Reports pulmonologist listened to her; however, did not do a CXR. Requesting Singulair generic brand. Son is with pt and is concerned about her memory. He has noticed subtle changes in pt and is concerned such as her making her lunch and forgetting to take it. She is also scheduled to see Greggory Stallion today at 10:00. Reports Zoloft is helping.       Allergies   Allergen Reactions    Atorvastatin Other (See Comments)     Muscle pain    Hydrocodone-Acetaminophen Itching    Penicillins Hives       Past Medical History:   Diagnosis Date    Arthritis     Chronic back pain     Hypertension     Hyperthyroidism     Thyroid disease     hypothyroid       Family History   Problem Relation Age of Onset    Asthma Mother     Lung Disease Mother     High Blood Pressure Mother     Emphysema Mother     Liver Disease Father     Alcohol Abuse Father     Early Death Sister     Stroke Brother     Diabetes Brother     Alcohol Abuse Brother     Early Death Brother     Heart Attack Brother     High Blood Pressure Brother     Substance Abuse Brother     Alcohol Abuse Brother     Diabetes Brother     High Blood Pressure Brother     Substance Abuse Brother     Breast Cancer Neg Hx        Social History     Socioeconomic History    Marital status: Divorced      Spouse name: Not on file    Number of children: Not on file    Years of education: Not on file    Highest education level: Not on file   Occupational History    Not on file   Tobacco Use    Smoking status: Former     Current packs/day: 0.00     Average packs/day: 1 pack/day for 17.7 years (17.7 ttl pk-yrs)     Types: Cigarettes     Start date: 41     Quit date: 10/25/1996     Years since quitting: 26.2     Passive exposure: Never    Smokeless tobacco: Former   Substance and Sexual Activity    Alcohol use: No    Drug use: No    Sexual activity: Not Currently     Partners: Male   Other Topics Concern    Not on file   Social History Narrative  Not on file     Social Determinants of Health     Financial Resource Strain: Low Risk  (06/20/2022)    Overall Financial Resource Strain (CARDIA)     Difficulty of Paying Living Expenses: Not hard at all   Food Insecurity: No Food Insecurity (06/20/2022)    Hunger Vital Sign     Worried About Running Out of Food in the Last Year: Never true     Ran Out of Food in the Last Year: Never true   Transportation Needs: Unknown (06/20/2022)    PRAPARE - Therapist, art (Medical): Not on file     Lack of Transportation (Non-Medical): No   Physical Activity: Inactive (12/31/2022)    Exercise Vital Sign     Days of Exercise per Week: 0 days     Minutes of Exercise per Session: 0 min   Stress: Not on file   Social Connections: Not on file   Intimate Partner Violence: Not on file   Housing Stability: Unknown (06/20/2022)    Housing Stability Vital Sign     Unable to Pay for Housing in the Last Year: Not on file     Number of Places Lived in the Last Year: Not on file     Unstable Housing in the Last Year: No       OB History    No obstetric history on file.         Past Surgical History:   Procedure Laterality Date    APPENDECTOMY      CERVICAL DISCECTOMY  09/16/06    Anterior C5-C6 diskectomy with decompression;  anterior cervical arthrodesis interbody technique;   anterior cervical instrumentation;  tricortical iliac crest bone graft harvested through a separate incision.     CHOLECYSTECTOMY      DIAGNOSTIC CARDIAC CATH LAB PROCEDURE      TAH AND BSO (CERVIX REMOVED)      TUBAL LIGATION         Health Maintenance   Topic Date Due    Respiratory Syncytial Virus (RSV) Pregnant or age 33 yrs+ (1 - 1-dose 60+ series) Never done    Annual Wellness Visit (Medicare Advantage)  02/25/2022    Flu vaccine (1) 09/26/2022    COVID-19 Vaccine (5 - 2023-24 season) 10/27/2022    Breast cancer screen  09/05/2023    Colorectal Cancer Screen  11/27/2023    Depression Monitoring  12/28/2023    Lipids  10/03/2027    DTaP/Tdap/Td vaccine (3 - Td or Tdap) 10/02/2032    DEXA (modify frequency per FRAX score)  Completed    Shingles vaccine  Completed    Pneumococcal 65+ years Vaccine  Completed    Hepatitis C screen  Completed    Hepatitis A vaccine  Aged Out    Hepatitis B vaccine  Aged Out    Hib vaccine  Aged Out    Polio vaccine  Aged Out    Meningococcal (ACWY) vaccine  Aged Out    Depression Screen  Discontinued    Pneumococcal 0-64 years Vaccine  Discontinued    Diabetes screen  Discontinued    Cervical cancer screen  Discontinued         Current Outpatient Medications:     albuterol sulfate HFA (VENTOLIN HFA) 108 (90 Base) MCG/ACT inhaler, Inhale 2 puffs into the lungs 4 times daily as needed for Wheezing, Disp: 18 g, Rfl: 0    budesonide-formoterol (SYMBICORT) 160-4.5 MCG/ACT AERO, Inhale 2 puffs  into the lungs 2 times daily, Disp: 30.6 g, Rfl: 1    cyclobenzaprine (FLEXERIL) 10 MG tablet, Take 1 tablet by mouth daily, Disp: 30 tablet, Rfl: 1    sertraline (ZOLOFT) 50 MG tablet, Take 1 tablet by mouth daily, Disp: 90 tablet, Rfl: 1    levothyroxine (SYNTHROID) 75 MCG tablet, Take 1 tablet by mouth every morning (before breakfast), Disp: 90 tablet, Rfl: 1    azithromycin (ZITHROMAX) 250 MG tablet, TAKE 2 TABS ON DAY 1, THEN 1 TAB A DAY FOR 4 DAYS (Patient not taking: Reported on 12/31/2022),  Disp: 6 tablet, Rfl: 0    fluticasone (FLONASE) 50 MCG/ACT nasal spray, 2 sprays by Each Nostril route daily (Patient not taking: Reported on 11/07/2022), Disp: 16 g, Rfl: 0    alendronate (FOSAMAX) 70 MG tablet, Take 1 tablet by mouth every 7 days (Patient not taking: Reported on 10/21/2022), Disp: 4 tablet, Rfl: 3    aspirin 81 MG chewable tablet, Take by mouth daily (Patient not taking: Reported on 12/31/2022), Disp: , Rfl:     Review of Systems   Constitutional: Negative.    HENT: Negative.     Eyes: Negative.    Respiratory:  Positive for cough and shortness of breath.    Cardiovascular: Negative.    Gastrointestinal: Negative.    Endocrine: Negative.    Genitourinary: Negative.    Musculoskeletal: Negative.    Skin: Negative.    Allergic/Immunologic: Negative.    Neurological:         Possible memory issues   Hematological: Negative.    Psychiatric/Behavioral: Negative.          Vitals:    12/31/22 0804   BP: (!) 143/84   Pulse: (!) 105   SpO2: 96%        Physical Exam  Constitutional:       Appearance: Normal appearance.   HENT:      Head: Normocephalic.      Nose: Nose normal.   Eyes:      Extraocular Movements: Extraocular movements intact.      Pupils: Pupils are equal, round, and reactive to light.   Cardiovascular:      Rate and Rhythm: Normal rate and regular rhythm.   Pulmonary:      Effort: Pulmonary effort is normal.      Breath sounds: Normal breath sounds.   Abdominal:      General: Abdomen is flat.      Palpations: Abdomen is soft.   Musculoskeletal:         General: Normal range of motion.      Cervical back: Normal range of motion and neck supple.   Skin:     General: Skin is warm and dry.   Neurological:      General: No focal deficit present.      Mental Status: She is alert and oriented to person, place, and time.   Psychiatric:         Mood and Affect: Mood normal.         Behavior: Behavior normal.              12/31/2022     8:06 AM 12/28/2022    10:48 AM 11/07/2022     2:54 PM 11/07/2022      2:53 PM 10/21/2022     3:46 PM 10/21/2022     3:45 PM 03/28/2022     8:01 AM   PHQ Scores   PHQ2 Score  0 0  0  0 0   PHQ9 Score 4 0 6 0 0 0 0      The 10-year ASCVD risk score (Arnett DK, et al., 2019) is: 7.6%    Values used to calculate the score:      Age: 64 years      Sex: Female      Is Non-Hispanic African American: No      Diabetic: No      Tobacco smoker: No      Systolic Blood Pressure: 143 mmHg      Is BP treated: No      HDL Cholesterol: 56 MG/DL      Total Cholesterol: 200 MG/DL  Assessment & Plan:    1. Memory loss  Head CT from 02/24:  IMPRESSION:  No CT evidence of acute intracranial abnormality. Comprehensive Metabolic Panel; Future  - Vitamin B12; Future    2. Other fatigue  - CBC with Auto Differential; Future    3. Dyspnea, unspecified type  - XR CHEST PA LAT (2 VIEWS); Future  - Brain Natriuretic Peptide; Future      4. Cough, unspecified type  - XR CHEST PA LAT (2 VIEWS); Future  - Brain Natriuretic Peptide; Future      5. Pneumonia due to infectious organism, unspecified laterality, unspecified part of lung  Finished Zpak today.     6. Encounter for subsequent annual wellness visit (AWV) in Medicare patient      Greater than 50% counseling and/or coordination of care: the treatment regimen is extensive; detailed review. Will notify of labs. This note was dictated using dragon voice recognition software.  It has been proofread, but there may still exist voice recognition errors that the author did not detect.      Signed By: Erskine Squibb, APRN - CNP     December 31, 2022    1. Medicare annual wellness visit, subsequent     2. Screening for Depression  PHQ9=4     3. Screening for alcoholism  Pt denies drinking     4. Screening immunizations  Immunizations are not UTD; however, pt does not want one today due to being sick.      5. Screening for Hep C  05/15/2021- negative     6. Screening for colon cancer  Colonoscopy 2020, due again 2025     7. Screening for breast cancer  Mammogram scheduled  for this week     8. Screening for cervical cancer  Hysterectomy years ago; not due to cancer per pt.     9. Postmenopausal  Dexa scan 07/22; due 07/24-ordered     10. Routine eye exam  Pt scheduling appt.         Reviewed health care maintenance. All appropriate screenings ordered or discussed/offered that are recommended or due. Information provided on Advanced Directive,Living Will and POA with AVS. Pt to return for next Medicare Wellness in 1 year.Medicare Annual Wellness Visit    Sandra Harmon is here for Other (Dx with pneumonia last Friday at pulmonologist visit with Dr. Virgina Jock. Finished z pack has  gotten worst. Coughing, after standing having dizziness,weakness, fatigued and loss of appetite. Requesting Singulair generic brand.)    Assessment & Plan   Memory loss  -     Comprehensive Metabolic Panel; Future  -     Vitamin B12; Future  Other fatigue  -     CBC with Auto Differential; Future  Dyspnea, unspecified type  -  XR CHEST PA LAT (2 VIEWS); Future  -     Brain Natriuretic Peptide; Future  -     XR CHEST PA LAT (2 VIEWS); Future  Cough, unspecified type  -     XR CHEST PA LAT (2 VIEWS); Future  -     Brain Natriuretic Peptide; Future  -     XR CHEST PA LAT (2 VIEWS); Future  Pneumonia due to infectious organism, unspecified laterality, unspecified part of lung  Encounter for subsequent annual wellness visit (AWV) in Medicare patient    Recommendations for Preventive Services Due: see orders and patient instructions/AVS.  Recommended screening schedule for the next 5-10 years is provided to the patient in written form: see Patient Instructions/AVS.     Return for Medicare Annual Wellness Visit in 1 year.     Subjective       Patient's complete Health Risk Assessment and screening values have been reviewed and are found in Flowsheets. The following problems were reviewed today and where indicated follow up appointments were made and/or referrals ordered.    Positive Risk Factor Screenings with  Interventions:    Fall Risk:  Do you feel unsteady or are you worried about falling? : (!) yes  2 or more falls in past year?: (!) yes  Fall with injury in past year?: no     Interventions:    Reviewed medications, home hazards, visual acuity, and co-morbidities that can increase risk for falls            General HRA Questions:  Select all that apply: (!) New or Increased Fatigue  Interventions Fatigue:  Patient declined any further interventions or treatment      Inactivity:  On average, how many days per week do you engage in moderate to strenuous exercise (like a brisk walk)?: 0 days (!) Abnormal  On average, how many minutes do you engage in exercise at this level?: 0 min  Interventions:  Patient declined any further interventions or treatment     Abnormal BMI (obese):  Body mass index is 35.15 kg/m. (!) Abnormal  Interventions:  Patient declines any further evaluation or treatment        Dentist Screen:  Have you seen the dentist within the past year?: (!) No    Intervention:  Patient declines any further evaluation or treatment    Hearing Screen:  Do you or your family notice any trouble with your hearing that hasn't been managed with hearing aids?: (!) Yes    Interventions:  Patient declines any further evaluation or treatment     Safety:  Do you have any tripping hazards - loose or unsecured carpets or rugs?: (!) Yes  Do you have non-slip mats or non-slip surfaces or shower bars or grab bars in your shower or bathtub?: (!) No  Interventions:  Patient declined any further interventions or treatment     Advanced Directives:  Do you have a Living Will?: (!) No    Intervention:  has NO advanced directive - information provided                     Objective   Vitals:    12/31/22 0804   BP: (!) 143/84   Pulse: (!) 105   SpO2: 96%   Weight: 81.6 kg (180 lb)   Height: 1.524 m (5')      Body mass index is 35.15 kg/m.  Allergies   Allergen Reactions    Atorvastatin Other (See Comments)     Muscle  pain    Hydrocodone-Acetaminophen Itching    Penicillins Hives     Prior to Visit Medications    Medication Sig Taking? Authorizing Provider   albuterol sulfate HFA (VENTOLIN HFA) 108 (90 Base) MCG/ACT inhaler Inhale 2 puffs into the lungs 4 times daily as needed for Wheezing Yes Sine, Christy, MD   budesonide-formoterol (SYMBICORT) 160-4.5 MCG/ACT AERO Inhale 2 puffs into the lungs 2 times daily Yes Sine, Christy, MD   cyclobenzaprine (FLEXERIL) 10 MG tablet Take 1 tablet by mouth daily Yes Cothran-Pate, Tonette Koehne L, APRN - CNP   sertraline (ZOLOFT) 50 MG tablet Take 1 tablet by mouth daily Yes Cothran-Pate, Aariona Momon L, APRN - CNP   levothyroxine (SYNTHROID) 75 MCG tablet Take 1 tablet by mouth every morning (before breakfast) Yes Cothran-Pate, Jancarlos Thrun L, APRN - CNP   azithromycin (ZITHROMAX) 250 MG tablet TAKE 2 TABS ON DAY 1, THEN 1 TAB A DAY FOR 4 DAYS  Patient not taking: Reported on 12/31/2022  Floreen Comber, MD   fluticasone [facility]) 50 MCG/ACT nasal spray 2 sprays by Each Nostril route daily  Patient not taking: Reported on 11/07/2022  Noel Christmas, APRN - CNP   alendronate (FOSAMAX) 70 MG tablet Take 1 tablet by mouth every 7 days  Patient not taking: Reported on 10/21/2022  Cothran-Pate, Zebulin Siegel L, APRN - CNP   aspirin 81 MG chewable tablet Take by mouth daily  Patient not taking: Reported on 12/31/2022  Automatic Reconciliation, Ar       CareTeam (Including outside providers/suppliers regularly involved in providing care):   Patient Care Team:  Cothran-Pate, Delfin Edis, APRN - CNP as PCP - General  Cothran-Pate, Delfin Edis, APRN - CNP as PCP - Empaneled Provider      Reviewed and updated this visit:  Tobacco  Allergies  Meds  Med Hx  Surg Hx  Soc Hx  Fam Hx

## 2022-12-31 NOTE — Patient Instructions (Signed)
Preventing Falls: Care Instructions  Injuries and health problems such as trouble walking or poor eyesight can increase your risk of falling. So can some medicines. But there are things you can do to help prevent falls. You can exercise to get stronger. You can also arrange your home to make it safer.    Talk to your doctor about the medicines you take. Ask if any of them increase the risk of falls and whether they can be changed or stopped.   Try to exercise regularly. It can help improve your strength and balance. This can help lower your risk of falling.         Practice fall safety and prevention.   Wear low-heeled shoes that fit well and give your feet good support. Talk to your doctor if you have foot problems that make this hard.  Carry a cellphone or wear a medical alert device that you can use to call for help.  Use stepladders instead of chairs to reach high objects. Don't climb if you're at risk for falls. Ask for help, if needed.  Wear the correct eyeglasses, if you need them.        Make your home safer.   Remove rugs, cords, clutter, and furniture from walkways.  Keep your house well lit. Use night-lights in hallways and bathrooms.  Install and use sturdy handrails on stairways.  Wear nonskid footwear, even inside. Don't walk barefoot or in socks without shoes.        Be safe outside.   Use handrails, curb cuts, and ramps whenever possible.  Keep your hands free by using a shoulder bag or backpack.  Try to walk in well-lit areas. Watch out for uneven ground, changes in pavement, and debris.  Be careful in the winter. Walk on the grass or gravel when sidewalks are slippery. Use de-icer on steps and walkways. Add non-slip devices to shoes.    Put grab bars and nonskid mats in your shower or tub and near the toilet. Try to use a shower chair or bath bench when bathing.   Get into a tub or shower by putting in your weaker leg first. Get out with your strong side first. Have a phone or medical alert  device in the bathroom with you.   Where can you learn more?  Go to [email or web address] and enter G117 to learn more about "Preventing Falls: Care Instructions."  Current as of: September 10, 2021  Content Version: 14.2   [address] [place].   Care instructions adapted under license by [facility]. If you have questions about a medical condition or this instruction, always ask your healthcare professional. [employer] disclaims any warranty or liability for your use of this information.           Fatigue: Care Instructions  Overview     Fatigue is a feeling of tiredness, exhaustion, or lack of energy. You may feel fatigue because of too much or not enough activity. It can also come from stress, lack of sleep, boredom, and poor diet. Many medical problems, such as viral infections, can cause fatigue. Emotional problems, especially depression, are often the cause of fatigue.  Fatigue is most often a symptom of another problem. Treatment for fatigue depends on the cause. For example, if you have fatigue because you have a certain health problem, treating this problem also treats your fatigue. If depression or anxiety is the cause, treatment may help.  Follow-up care is a key part of your treatment  and safety. Be sure to make and go to all appointments, and call your doctor if you are having problems. It's also a good idea to know your test results and keep a list of the medicines you take.  How can you care for yourself at home?  Get regular exercise. But try not to overdo it. It may help to go back and forth between rest and exercise.  Get plenty of rest.  Eat a variety of healthy foods. Try not to skip any meals.  Avoid or try to cut back on your use of caffeine, tobacco, and alcohol. Caffeine is most often found in coffee, tea, cola drinks, and energy drinks.  Limit medicines that can cause fatigue. These include medicines such as cold and allergy medicines.  When should you call  for help?  Watch closely for changes in your health, and be sure to contact your doctor if:   You have new symptoms such as fever or a rash.    Your fatigue gets worse.    You have been feeling down, depressed, or hopeless. Or you may have lost interest in things that you usually enjoy.    You are not getting better as expected.   Where can you learn more?  Go to [email or web address] and enter W864 to learn more about "Fatigue: Care Instructions."  Current as of: August 18, 2021  Content Version: 14.2   [address], [place].   Care instructions adapted under license by [facility]. If you have questions about a medical condition or this instruction, always ask your healthcare professional. [employer] disclaims any warranty or liability for your use of this information.           Learning About Being Active as an Older Adult  Why is being active important as you get older?     Being active is one of the best things you can do for your health. And it's never too late to start. Being active--or getting active, if you aren't already--has definite benefits. It can:  Give you more energy,  Keep your mind sharp.  Improve balance to reduce your risk of falls.  Help you manage chronic illness with fewer medicines.  No matter how old you are, how fit you are, or what health problems you have, there is a form of activity that will work for you. And the more physical activity you can do, the better your overall health will be.  What kinds of activity can help you stay healthy?  Being more active will make your daily activities easier. Physical activity includes planned exercise and things you do in daily life. There are four types of activity:  Aerobic.  Doing aerobic activity makes your heart and lungs strong.  Includes walking, dancing, and gardening.  Aim for at least 2 hours spread throughout the week.  It improves your energy and can help you sleep  better.  Muscle-strengthening.  This type of activity can help maintain muscle and strengthen bones.  Includes climbing stairs, using resistance bands, and lifting or carrying heavy loads.  Aim for at least twice a week.  It can help protect the knees and other joints.  Stretching.  Stretching gives you better range of motion in joints and muscles.  Includes upper arm stretches, calf stretches, and gentle yoga.  Aim for at least twice a week, preferably after your muscles are warmed up from other activities.  It can help you function better in daily life.  Balancing.  This helps you stay coordinated and have good posture.  Includes heel-to-toe walking, tai chi, and certain types of yoga.  Aim for at least 3 days a week.  It can reduce your risk of falling.  Even if you have a hard time meeting the recommendations, it's better to be more active than less active. All activity done in each category counts toward your weekly total. You'd be surprised how daily things like carrying groceries, keeping up with grandchildren, and taking the stairs can add up.  What keeps you from being active?  If you've had a hard time being more active, you're not alone. Maybe you remember being able to do more. Or maybe you've never thought of yourself as being active. It's frustrating when you can't do the things you want. Being more active can help. What's holding you back?  Getting started.  Have a goal, but break it into easy tasks. Small steps build into big accomplishments.  Staying motivated.  If you feel like skipping your activity, remember your goal. Maybe you want to move better and stay independent. Every activity gets you one step closer.  Not feeling your best.  Start with 5 minutes of an activity you enjoy. Prove to yourself you can do it. As you get comfortable, increase your time.  You may not be where you want to be. But you're in the process of getting there. Everyone starts somewhere.  How can you find safe ways to  stay active?  Talk with your doctor about any physical challenges you're facing. Make a plan with your doctor if you have a health problem or aren't sure how to get started with activity.  If you're already active, ask your doctor if there is anything you should change to stay safe as your body and health change.  If you tend to feel dizzy after you take medicine, avoid activity at that time. Try being active before you take your medicine. This will reduce your risk of falls.  If you plan to be active at home, make sure to clear your space before you get started. Remove things like TV cords, coffee tables, and throw rugs. It's safest to have plenty of space to move freely.  The key to getting more active is to take it slow and steady. Try to improve only a little bit at a time. Pick just one area to improve on at first. And if an activity hurts, stop and talk to your doctor.  Where can you learn more?  Go to [email or web address] and enter P600 to learn more about "Learning About Being Active as an Older Adult."  Current as of: July 30, 2021  Content Version: 14.2   [address] [place].   Care instructions adapted under license by [facility]. If you have questions about a medical condition or this instruction, always ask your healthcare professional. [employer] disclaims any warranty or liability for your use of this information.           Learning About Dental Care for Older Adults  Dental care for older adults: Overview  Dental care for older people is much the same as for younger adults. But older adults do have concerns that younger adults do not. Older adults may have problems with gum disease and decay on the roots of their teeth. They may need missing teeth replaced or broken fillings fixed. Or they may have dentures that need to be cared for. Some older adults may have trouble holding a  toothbrush.  You can help remind the person you are caring for to brush and floss  their teeth or to clean their dentures. In some cases, you may need to do the brushing and other dental care tasks. People who have trouble using their hands or who have dementia may need this extra help.  How can you help with dental care?  Normal dental care  To keep the teeth and gums healthy:  Brush the teeth with fluoride toothpaste twice a day--in the morning and at night--and floss at least once a day. Plaque can quickly build up on the teeth of older adults.  Watch for the signs of gum disease. These signs include gums that bleed after brushing or after eating hard foods, such as apples.  See a dentist regularly. Many experts recommend checkups every 6 months.  Keep the dentist up to date on any new medications the person is taking.  Encourage a balanced diet that includes whole grains, vegetables, and fruits, and that is low in saturated fat and sodium.  Encourage the person you're caring for not to use tobacco products. They can affect dental and general health.  Many older adults have a fixed income and feel that they can't afford dental care. But most towns and cities have programs in which dentists help older adults by lowering fees. Contact your area's public health offices or social services for information about dental care in your area.  Using a toothbrush  Older adults with arthritis sometimes have trouble brushing their teeth because they can't easily hold the toothbrush. Their hands and fingers may be stiff, painful, or weak. If this is the case, you can:  Offer an Mining engineer toothbrush.  Enlarge the handle of a non-electric toothbrush by wrapping a sponge, an elastic bandage, or adhesive tape around it.  Push the toothbrush handle through a ball made of rubber or soft foam.  Make the handle longer and thicker by taping Popsicle sticks or tongue depressors to it.  You may also be able to buy special toothbrushes, toothpaste dispensers, and floss holders.  Your doctor may recommend a soft-bristle  toothbrush if the person you care for bleeds easily. Bleeding can happen because of a health problem or from certain medicines.  A toothpaste for sensitive teeth may help if the person you care for has sensitive teeth.  How do you brush and floss someone's teeth?  If the person you are caring for has a hard time cleaning their teeth on their own, you may need to brush and floss their teeth for them. It may be easiest to have the person sit and face away from you, and to sit or stand behind them. That way you can steady their head against your arm as you reach around to floss and brush their teeth. Choose a place that has good lighting and is comfortable for both of you.  Before you begin, gather your supplies. You will need gloves, floss, a toothbrush, and a container to hold water if you are not near a sink. Wash and dry your hands well and put on gloves. Start by flossing:  Gently work a piece of floss between each of the teeth toward the gums. A plastic flossing tool may make this easier, and they are available at most drugstores.  Curve the floss around each tooth into a U-shape and gently slide it under the gum line.  Move the floss firmly up and down several times to scrape off the plaque.  After  you've finished flossing, throw away the used floss and begin brushing:  Wet the brush and apply toothpaste.  Place the brush at a 45-degree angle where the teeth meet the gums. Press firmly, and move the brush in small circles over the surface of the teeth.  Be careful not to brush too hard. Vigorous brushing can make the gums pull away from the teeth and can scratch the tooth enamel.  Brush all surfaces of the teeth, on the tongue side and on the cheek side. Pay special attention to the front teeth and all surfaces of the back teeth.  Brush chewing surfaces with short back-and-forth strokes.  After you've finished, help the person rinse the remaining toothpaste from their mouth.  Where can you learn more?  Go to  [employer] and enter F944 to learn more about "Learning About Dental Care for Older Adults."  Current as of: September 30, 2021  Content Version: 14.2   [address], [place].   Care instructions adapted under license by [facility]. If you have questions about a medical condition or this instruction, always ask your healthcare professional. [employer] disclaims any warranty or liability for your use of this information.           Hearing Loss: Care Instructions  Overview     Hearing loss is a sudden or slow decrease in how well you hear. It can range from slight to profound. Permanent hearing loss can occur with aging. It also can happen when you are exposed long-term to loud noise. Examples include listening to loud music, riding motorcycles, or being around other loud machines.  Hearing loss can affect your work and home life. It can make you feel lonely or depressed. You may feel that you have lost your independence. But hearing aids and other devices can help you hear better and feel connected to others.  Follow-up care is a key part of your treatment and safety. Be sure to make and go to all appointments, and call your doctor if you are having problems. It's also a good idea to know your test results and keep a list of the medicines you take.  How can you care for yourself at home?  Avoid loud noises whenever possible. This helps keep your hearing from getting worse.  Always wear hearing protection around loud noises.  Wear a hearing aid as directed.  A professional can help you pick a hearing aid that will work best for you.  You can also get hearing aids over the counter for mild to moderate hearing loss.  Have hearing tests as your doctor suggests. They can show whether your hearing has changed. Your hearing aid may need to be adjusted.  Use other devices as needed. These may include:  Telephone amplifiers and hearing aids that can connect to a television, stereo,  radio, or microphone.  Devices that use lights or vibrations. These alert you to the doorbell, a ringing telephone, or a baby monitor.  Television closed-captioning. This shows the words at the bottom of the screen. Most new TVs can do this.  TTY (text telephone). This lets you type messages back and forth on the telephone instead of talking or listening. These devices are also called TDD. When messages are typed on the keyboard, they are sent over the phone line to a receiving TTY. The message is shown on a monitor.  Use text messaging, social media, and email if it is hard for you to communicate by telephone.  Try  to learn a listening technique called speechreading. It is not lipreading. You pay attention to people's gestures, expressions, posture, and tone of voice. These clues can help you understand what a person is saying. Face the person you are talking to, and have them face you. Make sure the lighting is good. You need to see the other person's face clearly.  Think about counseling if you need help to adjust to your hearing loss.  When should you call for help?  Watch closely for changes in your health, and be sure to contact your doctor if:   You think your hearing is getting worse.    You have new symptoms, such as dizziness or nausea.   Where can you learn more?  Go to [employer] and enter R798 to learn more about "Hearing Loss: Care Instructions."  Current as of: November 21, 2021  Content Version: 14.2   [address], [place].   Care instructions adapted under license by [facility]. If you have questions about a medical condition or this instruction, always ask your healthcare professional. Health[employer] Incorporated disclaims any warranty or liability for your use of this information.           Advance Directives: Care Instructions  Overview  An advance directive is a legal way to state your wishes at the end of your life. It tells your family and your doctor what  to do if you can't say what you want.  There are two main types of advance directives. You can change them any time your wishes change.  Living will.  This form tells your family and your doctor your wishes about life support and other treatment. The form is also called a declaration.  Medical power of attorney.  This form lets you name a person to make treatment decisions for you when you can't speak for yourself. This person is called a health care agent (health care proxy, health care surrogate). The form is also called a durable power of attorney for health care.  If you do not have an advance directive, decisions about your medical care may be made by a family member, or by a doctor or a judge who doesn't know you.  It may help to think of an advance directive as a gift to the people who care for you. If you have one, they won't have to make tough decisions by themselves.  For more information, including forms for your state, see the [employer] website ([employer]cy).  Follow-up care is a key part of your treatment and safety. Be sure to make and go to all appointments, and call your doctor if you are having problems. It's also a good idea to know your test results and keep a list of the medicines you take.  What should you include in an advance directive?  Many states have a unique advance directive form. (It may ask you to address specific issues.) Or you might use a universal form that's approved by many states.  If your form doesn't tell you what to address, it may be hard to know what to include in your advance directive. Use the questions below to help you get started.  Who do you want to make decisions about your medical care if you are not able to?  What life-support measures do you want if you have a serious illness that gets worse over time or can't be cured?  What are you most afraid of that might happen? (Maybe you're afraid of having pain,  losing your  independence, or being kept alive by machines.)  Where would you prefer to die? (Your home? A hospital? A nursing home?)  Do you want to donate your organs when you die?  Do you want certain religious practices performed before you die?  When should you call for help?  Be sure to contact your doctor if you have any questions.  Where can you learn more?  Go to [employer] and enter R264 to learn more about "Advance Directives: Care Instructions."  Current as of: January 10, 2022  Content Version: 14.2   [address], [place].   Care instructions adapted under license by [facility]. If you have questions about a medical condition or this instruction, always ask your healthcare professional. [employer] disclaims any warranty or liability for your use of this information.           Starting a Weight Loss Plan: Care Instructions  Overview     It can be a challenge to lose weight. But your doctor can help you make a weight-loss plan that meets your needs.  You don't have to make a lot of big changes at once. A better idea might be to focus on small changes and stick with them. When those changes become habit, you can add a few more changes.  Some people find it helpful to take an exercise or nutrition class. If you have questions, ask your doctor about seeing a registered dietitian or an exercise specialist. You might also think about joining a weight-loss support group.  If you're not ready to make changes right now, try to pick a date in the future. Then make an appointment with your doctor to talk about when and how you'll get started with a plan.  Follow-up care is a key part of your treatment and safety. Be sure to make and go to all appointments, and call your doctor if you are having problems. It's also a good idea to know your test results and keep a list of the medicines you take.  How can you care for yourself at home?  Set realistic goals. Many people expect to  lose much more weight than is likely. A weight loss of 5% to 10% of your body weight may be enough to improve your health.  Get family and friends involved to provide support. Talk to them about why you are trying to lose weight, and ask them to help. They can help by participating in exercise and having meals with you, even if they may be eating something different.  Find what works best for you. If you do not have time or do not like to cook, a program that offers meal replacement bars or shakes may be better for you. Or if you like to prepare meals, finding a plan that includes daily menus and recipes may be best.  Ask your doctor about other health professionals who can help you achieve your weight loss goals.  A dietitian can help you make healthy changes in your diet.  An exercise specialist or personal trainer can help you develop a safe and effective exercise program.  A counselor or psychiatrist can help you cope with issues such as depression, anxiety, or family problems that can make it hard to focus on weight loss.  Consider joining a support group for people who are trying to lose weight. Your doctor can suggest groups in your area.  Where can you learn more?  Go to [employer] and enter [phone]  to learn more about "Starting a Weight Loss Plan: Care Instructions."  Current as of: November 14, 2021  Content Version: 14.2   [address] [place].   Care instructions adapted under license by [facility]. If you have questions about a medical condition or this instruction, always ask your healthcare professional. [employer] disclaims any warranty or liability for your use of this information.           A Healthy Heart: Care Instructions  Overview     Coronary artery disease, also called heart disease, occurs when a substance called plaque builds up in the vessels that supply oxygen-rich blood to your heart muscle. This can narrow the blood vessels and reduce  blood flow. A heart attack happens when blood flow is completely blocked. A high-fat diet, smoking, and other factors increase the risk of heart disease.  Your doctor has found that you have a chance of having heart disease. A heart-healthy lifestyle can help keep your heart healthy and prevent heart disease. This lifestyle includes eating healthy, being active, staying at a weight that's healthy for you, and not smoking or using tobacco. It also includes taking medicines as directed, managing other health conditions, and trying to get a healthy amount of sleep.  Follow-up care is a key part of your treatment and safety. Be sure to make and go to all appointments, and call your doctor if you are having problems. It's also a good idea to know your test results and keep a list of the medicines you take.  How can you care for yourself at home?  Diet   Use less salt when you cook and eat. This helps lower your blood pressure. Taste food before salting. Add only a little salt when you think you need it. With time, your taste buds will adjust to less salt.    Eat fewer snack items, fast foods, canned soups, and other high-salt, high-fat, processed foods.    Read food labels and try to avoid saturated and trans fats. They increase your risk of heart disease by raising cholesterol levels.    Limit the amount of solid fat--butter, margarine, and shortening--you eat. Use olive, peanut, or canola oil when you cook. Bake, broil, and steam foods instead of frying them.    Eat a variety of fruit and vegetables every day. Dark green, deep orange, red, or yellow fruits and vegetables are especially good for you. Examples include spinach, carrots, peaches, and berries.    Foods high in fiber can reduce your cholesterol and provide important vitamins and minerals. High-fiber foods include whole-grain cereals and breads, oatmeal, beans, brown rice, citrus fruits, and apples.    Eat lean proteins. Heart-healthy proteins include  seafood, lean meats and poultry, eggs, beans, peas, nuts, seeds, and soy products.    Limit drinks and foods with added sugar. These include candy, desserts, and soda pop.   Heart-healthy lifestyle   If your doctor recommends it, get more exercise. For many people, walking is a good choice. Or you may want to swim, bike, or do other activities. Bit by bit, increase the time you're active every day. Try for at least 30 minutes on most days of the week.    Try to quit or cut back on using tobacco and other nicotine products. This includes smoking and vaping. If you need help quitting, talk to your doctor about stop-smoking programs and medicines. These can increase your chances of quitting for good. Quitting is one of the  most important things you can do to protect your heart. It is never too late to quit. Try to avoid secondhand smoke too.    Stay at a weight that's healthy for you. Talk to your doctor if you need help losing weight.    Try to get 7 to 9 hours of sleep each night.    Limit alcohol to 2 drinks a day for men and 1 drink a day for women. Too much alcohol can cause health problems.    Manage other health problems such as diabetes, high blood pressure, and high cholesterol. If you think you may have a problem with alcohol or drug use, talk to your doctor.   Medicines   Take your medicines exactly as prescribed. Call your doctor if you think you are having a problem with your medicine.    If your doctor recommends aspirin, take the amount directed each day. Make sure you take aspirin and not another kind of pain reliever, such as acetaminophen (Tylenol).   When should you call for help?   Call 911 if you have symptoms of a heart attack. These may include:   Chest pain or pressure, or a strange feeling in the chest.    Sweating.    Shortness of breath.    Pain, pressure, or a strange feeling in the back, neck, jaw, or upper belly or in one or both shoulders or arms.    Lightheadedness or  sudden weakness.    A fast or irregular heartbeat.   After you call 911, the operator may tell you to chew 1 adult-strength or 2 to 4 low-dose aspirin. Wait for an ambulance. Do not try to drive yourself.  Watch closely for changes in your health, and be sure to contact your doctor if you have any problems.  Where can you learn more?  Go to [employer] and enter F075 to learn more about "A Healthy Heart: Care Instructions."  Current as of: August 18, 2021  Content Version: 14.2   [address], [place].   Care instructions adapted under license by [facility]. If you have questions about a medical condition or this instruction, always ask your healthcare professional. [employer] disclaims any warranty or liability for your use of this information.      Personalized Preventive Plan for Sandra Harmon - 12/31/2022  Medicare offers a range of preventive health benefits. Some of the tests and screenings are paid in full while other may be subject to a deductible, co-insurance, and/or copay.    Some of these benefits include a comprehensive review of your medical history including lifestyle, illnesses that may run in your family, and various assessments and screenings as appropriate.    After reviewing your medical record and screening and assessments performed today your provider may have ordered immunizations, labs, imaging, and/or referrals for you.  A list of these orders (if applicable) as well as your Preventive Care list are included within your After Visit Summary for your review.    Other Preventive Recommendations:    A preventive eye exam performed by an eye specialist is recommended every 1-2 years to screen for glaucoma; cataracts, macular degeneration, and other eye disorders.  A preventive dental visit is recommended every 6 months.  Try to get at least 150 minutes of exercise per week or 10,000 steps per day on a pedometer .  Order or download the FREE  "Exercise & Physical Activity: Your Everyday Guide" from [employer]. Call  1-718-072-6125 or search [employer] online.  You need 1200-1500 mg of calcium and 1000-2000 IU of vitamin D per day. It is possible to meet your calcium requirement with diet alone, but a vitamin D supplement is usually necessary to meet this goal.  When exposed to the sun, use a sunscreen that protects against both UVA and UVB radiation with an SPF of 30 or greater. Reapply every 2 to 3 hours or after sweating, drying off with a towel, or swimming.  Always wear a seat belt when traveling in a car. Always wear a helmet when riding a bicycle or motorcycle.

## 2023-01-01 LAB — VITAMIN B12: Vitamin B-12: 2624 pg/mL — ABNORMAL HIGH (ref 193–986)

## 2023-01-02 NOTE — Other (Signed)
Results to patient please.  Vitamin B12 is elevated.  How much vitamin B12 does she take daily?  Sodium and potassium are within normal limits.  Glucose is 123.  A1c was 5.4.  Liver enzymes are still elevated; however, GI has evaluated.  BMP was within normal limits.  Hemoglobin is 14.7  Thanks

## 2023-01-04 ENCOUNTER — Encounter

## 2023-01-04 ENCOUNTER — Inpatient Hospital Stay: Admit: 2023-01-04 | Payer: MEDICARE | Primary: Nurse Practitioner

## 2023-01-04 DIAGNOSIS — Z1231 Encounter for screening mammogram for malignant neoplasm of breast: Secondary | ICD-10-CM

## 2023-01-06 ENCOUNTER — Encounter

## 2023-01-08 MED ORDER — CYCLOBENZAPRINE HCL 10 MG PO TABS
10 MG | ORAL_TABLET | Freq: Every day | ORAL | 1 refills | Status: AC
Start: 2023-01-08 — End: 2023-04-01

## 2023-01-11 ENCOUNTER — Encounter: Admit: 2023-01-11 | Admitting: Nurse Practitioner

## 2023-01-11 DIAGNOSIS — R051 Acute cough: Secondary | ICD-10-CM

## 2023-01-12 MED ORDER — ALBUTEROL SULFATE HFA 108 (90 BASE) MCG/ACT IN AERS
108 (90 Base) MCG/ACT | Freq: Four times a day (QID) | RESPIRATORY_TRACT | 3 refills | Status: DC | PRN
Start: 2023-01-12 — End: 2023-07-02

## 2023-01-13 NOTE — Telephone Encounter (Signed)
 Informed pt and will use [employer] for a cheaper payment

## 2023-03-18 ENCOUNTER — Ambulatory Visit: Admit: 2023-03-18 | Discharge: 2023-03-18 | Payer: MEDICARE | Attending: Clinical | Primary: Nurse Practitioner

## 2023-03-18 DIAGNOSIS — F4310 Post-traumatic stress disorder, unspecified: Secondary | ICD-10-CM
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
## 2023-03-25 ENCOUNTER — Encounter

## 2023-04-01 ENCOUNTER — Inpatient Hospital Stay: Admit: 2023-04-01 | Payer: MEDICARE | Attending: Anesthesiology | Primary: Nurse Practitioner

## 2023-04-01 ENCOUNTER — Ambulatory Visit
Admit: 2023-04-01 | Discharge: 2023-04-01 | Payer: MEDICARE | Attending: Nurse Practitioner | Primary: Nurse Practitioner

## 2023-04-01 ENCOUNTER — Encounter

## 2023-04-01 VITALS — BP 110/72 | HR 82 | Ht 59.0 in | Wt 180.0 lb

## 2023-04-01 DIAGNOSIS — M5451 Vertebrogenic low back pain: Secondary | ICD-10-CM

## 2023-04-01 DIAGNOSIS — E782 Mixed hyperlipidemia: Secondary | ICD-10-CM

## 2023-04-01 LAB — VITAMIN D 25 HYDROXY: Vit D, 25-Hydroxy: 39.2 ng/mL (ref 30.0–100.0)

## 2023-04-01 LAB — COMPREHENSIVE METABOLIC PANEL
ALT: 43 U/L (ref 8–45)
AST: 65 U/L — ABNORMAL HIGH (ref 15–37)
Albumin/Globulin Ratio: 1 (ref 1.0–1.9)
Albumin: 3.3 g/dL (ref 3.2–4.6)
Alk Phosphatase: 75 U/L (ref 35–104)
Anion Gap: 12 mmol/L (ref 7–16)
BUN: 7 mg/dL — ABNORMAL LOW (ref 8–23)
CO2: 24 mmol/L (ref 20–29)
Calcium: 9.1 mg/dL (ref 8.8–10.2)
Chloride: 109 mmol/L — ABNORMAL HIGH (ref 98–107)
Creatinine: 0.55 mg/dL — ABNORMAL LOW (ref 0.60–1.10)
Est, Glom Filt Rate: >90 mL/min/{1.73_m2} (ref >60)
Globulin: 3.5 g/dL (ref 2.3–3.5)
Glucose: 108 mg/dL — ABNORMAL HIGH (ref 70–99)
Potassium: 3.4 mmol/L — ABNORMAL LOW (ref 3.5–5.1)
Sodium: 144 mmol/L (ref 136–145)
Total Bilirubin: 0.6 mg/dL (ref 0.0–1.2)
Total Protein: 6.8 g/dL (ref 6.3–8.2)

## 2023-04-01 LAB — LIPID PANEL
Chol/HDL Ratio: 2.5 (ref 0.0–5.0)
Cholesterol, Total: 144 mg/dL (ref 0–200)
HDL: 58 mg/dL (ref 40–60)
LDL Cholesterol: 70 mg/dL (ref 0–100)
Triglycerides: 82 mg/dL (ref 0–150)
VLDL Cholesterol Calculated: 16 mg/dL (ref 6–23)

## 2023-04-01 LAB — CBC WITH AUTO DIFFERENTIAL
Basophils %: 1 % (ref 0.0–2.0)
Basophils Absolute: 0.05 10*3/uL (ref 0.00–0.20)
Eosinophils %: 3.9 % (ref 0.5–7.8)
Eosinophils Absolute: 0.19 10*3/uL (ref 0.00–0.80)
Hematocrit: 40.7 % (ref 35.8–46.3)
Hemoglobin: 13.7 g/dL (ref 11.7–15.4)
Immature Granulocytes %: 0.2 % (ref 0.0–5.0)
Immature Granulocytes Absolute: 0.01 10*3/uL (ref 0.0–0.5)
Lymphocytes %: 32.9 % (ref 13.0–44.0)
Lymphocytes Absolute: 1.61 10*3/uL (ref 0.50–4.60)
MCH: 31.6 pg (ref 26.1–32.9)
MCHC: 33.7 g/dL (ref 31.4–35.0)
MCV: 93.8 fL (ref 82–102)
MPV: 10.5 fL (ref 9.4–12.3)
Monocytes %: 8.4 % (ref 4.0–12.0)
Monocytes Absolute: 0.41 10*3/uL (ref 0.10–1.30)
Neutrophils %: 53.6 % (ref 43.0–78.0)
Neutrophils Absolute: 2.63 10*3/uL (ref 1.70–8.20)
Platelets: 216 10*3/uL (ref 150–450)
RBC: 4.34 M/uL (ref 4.05–5.2)
RDW: 13.6 % (ref 11.9–14.6)
WBC: 4.9 10*3/uL (ref 4.3–11.1)
nRBC: 0 10*3/uL (ref 0.0–0.2)

## 2023-04-01 LAB — CK: Total CK: 53 U/L (ref 21–215)

## 2023-04-01 LAB — TSH: TSH, 3rd Generation: 2.01 u[IU]/mL (ref 0.270–4.200)

## 2023-04-01 MED ORDER — PRAVASTATIN SODIUM 20 MG PO TABS
20 MG | ORAL_TABLET | Freq: Every day | ORAL | 1 refills | Status: DC
Start: 2023-04-01 — End: 2023-07-02

## 2023-04-01 MED ORDER — ALENDRONATE SODIUM 70 MG PO TABS
70 | ORAL_TABLET | ORAL | 1 refills | Status: DC
Start: 2023-04-01 — End: 2023-10-02

## 2023-04-01 MED ORDER — LEVOTHYROXINE SODIUM 75 MCG PO TABS
75 MCG | ORAL_TABLET | Freq: Every day | ORAL | 1 refills | Status: DC
Start: 2023-04-01 — End: 2023-07-02

## 2023-04-01 MED ORDER — SERTRALINE HCL 50 MG PO TABS
50 MG | ORAL_TABLET | Freq: Every day | ORAL | 1 refills | Status: DC
Start: 2023-04-01 — End: 2023-07-02

## 2023-04-01 MED ORDER — CYCLOBENZAPRINE HCL 10 MG PO TABS
10 | ORAL_TABLET | ORAL | 0 refills | 10.00 days | Status: DC
Start: 2023-04-01 — End: 2023-07-02

## 2023-04-01 NOTE — Other (Signed)
Results to patient please.  Sodium was 144.  Potassium was slightly decreased at 3.4.  Please have patient increase potassium in diet.  Glucose is 108 and last A1c was within normal limits.    AST is elevated; however, patient has been evaluated by GI.  Vitamin D is within normal limits as well as TSH.  Total cholesterol is 144, triglycerides 82, HDL is 58, and LDL 70.  Thanks

## 2023-04-01 NOTE — Progress Notes (Signed)
[facility]  [address]  [place], [place] [address]   (ph) [phone] (fax) [phone]  Alexa Golebiewski L. Cothran-Pate APRN, FNP-C      Chief Complaint   Patient presents with    Medication Refill     Discuss flexeril and requesting abs for vit D.       68 yo female comes in for her 3 month follow up. She has seen Greggory Stallion since last here and thinks he will be able to help her. She is still having back pain and is under pain management. She had an MRI this morning and is to discuss with Pain management in two weeks. She wants to discuss flexeril and requesting labs for vit D. She feels that the flexeril helps muscle spasms in her back and arms.       Medication Refill        Allergies   Allergen Reactions    Atorvastatin Other (See Comments)     Muscle pain    Hydrocodone-Acetaminophen Itching    Penicillins Hives       Past Medical History:   Diagnosis Date    Arthritis     Chronic back pain     Hypertension     Hyperthyroidism     Thyroid disease     hypothyroid       Family History   Problem Relation Age of Onset    Asthma Mother     Lung Disease Mother     High Blood Pressure Mother     Emphysema Mother     Liver Disease Father     Alcohol Abuse Father     Early Death Sister     Stroke Brother     Diabetes Brother     Alcohol Abuse Brother     Early Death Brother     Heart Attack Brother     High Blood Pressure Brother     Substance Abuse Brother     Alcohol Abuse Brother     Diabetes Brother     High Blood Pressure Brother     Substance Abuse Brother     Breast Cancer Neg Hx        Social History     Socioeconomic History    Marital status: Divorced     Spouse name: Not on file    Number of children: Not on file    Years of education: Not on file    Highest education level: Not on file   Occupational History    Not on file   Tobacco Use    Smoking status: Former     Current packs/day: 0.00     Average packs/day: 1 pack/day for 17.7 years (17.7 ttl pk-yrs)     Types: Cigarettes      Start date: 83     Quit date: 10/25/1996     Years since quitting: 26.4     Passive exposure: Never    Smokeless tobacco: Former   Substance and Sexual Activity    Alcohol use: No    Drug use: No    Sexual activity: Not Currently     Partners: Male   Other Topics Concern    Not on file   Social History Narrative    Not on file     Social Determinants of Health     Financial Resource Strain: Low Risk  (06/20/2022)    Overall Financial Resource Strain (CARDIA)     Difficulty  of Paying Living Expenses: Not hard at all   Food Insecurity: No Food Insecurity (03/29/2023)    Hunger Vital Sign     Worried About Running Out of Food in the Last Year: Never true     Ran Out of Food in the Last Year: Never true   Transportation Needs: No Transportation Needs (03/29/2023)    PRAPARE - Therapist, art (Medical): No     Lack of Transportation (Non-Medical): No   Physical Activity: Inactive (12/31/2022)    Exercise Vital Sign     Days of Exercise per Week: 0 days     Minutes of Exercise per Session: 0 min   Stress: Not on file   Social Connections: Not on file   Intimate Partner Violence: Not on file   Housing Stability: Low Risk  (03/29/2023)    Housing Stability Vital Sign     Unable to Pay for Housing in the Last Year: No     Number of Times Moved in the Last Year: 0     Homeless in the Last Year: No       OB History    No obstetric history on file.         Past Surgical History:   Procedure Laterality Date    APPENDECTOMY      CERVICAL DISCECTOMY  09/16/06    Anterior C5-C6 diskectomy with decompression;  anterior cervical arthrodesis interbody technique;  anterior cervical instrumentation;  tricortical iliac crest bone graft harvested through a separate incision.     CHOLECYSTECTOMY      DIAGNOSTIC CARDIAC CATH LAB PROCEDURE      TAH AND BSO (CERVIX REMOVED)      TUBAL LIGATION         Health Maintenance   Topic Date Due    Respiratory Syncytial Virus (RSV) Pregnant or age 5 yrs+ (1 - Risk 60-74 years  1-dose series) Never done    COVID-19 Vaccine (5 - 2023-24 season) 10/27/2022    Annual Wellness Visit (Medicare Advantage)  02/26/2023    Lipids  10/03/2023    Colorectal Cancer Screen  11/27/2023    Depression Monitoring  03/17/2024    Breast cancer screen  01/03/2025    Diabetes screen  12/30/2025    DTaP/Tdap/Td vaccine (3 - Td or Tdap) 10/02/2032    DEXA (modify frequency per FRAX score)  Completed    Flu vaccine  Completed    Shingles vaccine  Completed    Pneumococcal 65+ years Vaccine  Completed    Hepatitis C screen  Completed    Hepatitis A vaccine  Aged Out    Hepatitis B vaccine  Aged Out    Hib vaccine  Aged Out    Polio vaccine  Aged Out    Meningococcal (ACWY) vaccine  Aged Out    Depression Screen  Discontinued    Pneumococcal 0-64 years Vaccine  Discontinued    Cervical cancer screen  Discontinued         Current Outpatient Medications:     alendronate (FOSAMAX) 70 MG tablet, Take 1 tablet by mouth every 7 days, Disp: 12 tablet, Rfl: 1    pravastatin (PRAVACHOL) 20 MG tablet, Take 1 tablet by mouth daily, Disp: 90 tablet, Rfl: 1    levothyroxine (SYNTHROID) 75 MCG tablet, Take 1 tablet by mouth every morning (before breakfast), Disp: 90 tablet, Rfl: 1    sertraline (ZOLOFT) 50 MG tablet, Take 1 tablet by mouth daily, Disp: 90  tablet, Rfl: 1    cyclobenzaprine (FLEXERIL) 10 MG tablet, 1/2 po in the evening, Disp: 30 tablet, Rfl: 0    albuterol sulfate HFA (PROVENTIL;VENTOLIN;PROAIR) 108 (90 Base) MCG/ACT inhaler, Inhale 2 puffs into the lungs every 6 hours as needed for Wheezing, Disp: 18 g, Rfl: 3    budesonide-formoterol (SYMBICORT) 160-4.5 MCG/ACT AERO, Inhale 2 puffs into the lungs 2 times daily, Disp: 30.6 g, Rfl: 1    fluticasone (FLONASE) 50 MCG/ACT nasal spray, 2 sprays by Each Nostril route daily (Patient not taking: Reported on 11/07/2022), Disp: 16 g, Rfl: 0    aspirin 81 MG chewable tablet, Take by mouth daily (Patient not taking: Reported on 12/31/2022), Disp: , Rfl:     Review of Systems    Constitutional: Negative.    HENT: Negative.     Eyes: Negative.    Respiratory: Negative.     Cardiovascular: Negative.    Gastrointestinal: Negative.    Endocrine: Negative.    Genitourinary: Negative.    Musculoskeletal:  Positive for back pain (chronic).        Reports muscle spasms   Skin: Negative.    Allergic/Immunologic: Negative.    Neurological: Negative.    Hematological: Negative.    Psychiatric/Behavioral: Negative.          Vitals:    04/01/23 0804   BP: 110/72   Pulse: 82   SpO2: 97%        Physical Exam  Constitutional:       Appearance: Normal appearance.   HENT:      Head: Normocephalic.      Nose: Nose normal.   Eyes:      Extraocular Movements: Extraocular movements intact.      Pupils: Pupils are equal, round, and reactive to light.   Cardiovascular:      Rate and Rhythm: Normal rate and regular rhythm.   Pulmonary:      Effort: Pulmonary effort is normal.      Breath sounds: Normal breath sounds.   Abdominal:      General: Abdomen is flat.      Palpations: Abdomen is soft.   Musculoskeletal:         General: Tenderness (lumbar) present. Normal range of motion.      Cervical back: Normal range of motion and neck supple.   Skin:     General: Skin is warm and dry.   Neurological:      General: No focal deficit present.      Mental Status: She is alert and oriented to person, place, and time.   Psychiatric:         Mood and Affect: Mood normal.         Behavior: Behavior normal.                03/18/2023    10:01 AM 12/31/2022     8:06 AM 12/28/2022    10:48 AM 11/07/2022     2:54 PM 11/07/2022     2:53 PM 10/21/2022     3:46 PM 10/21/2022     3:45 PM   PHQ Scores   PHQ2 Score 1  0 0 0  0   PHQ9 Score 4 4 0 6 0 0 0        Assessment & Plan:    1. Mixed hyperlipidemia  Stable; continue diet and med  - pravastatin (PRAVACHOL) 20 MG tablet; Take 1 tablet by mouth daily  Dispense: 90 tablet; Refill: 1  -  Comprehensive Metabolic Panel; Future  - CBC with Auto Differential; Future  - Lipid Panel; Future  -  CK; Future    2. Osteoporosis, unspecified osteoporosis type, unspecified pathological fracture presence  Stable; continue calcium and Vitamin D  Dexa ordered  - alendronate (FOSAMAX) 70 MG tablet; Take 1 tablet by mouth every 7 days  Dispense: 12 tablet; Refill: 1    3. Hypothyroidism, unspecified type  Stable; continue med  - levothyroxine (SYNTHROID) 75 MCG tablet; Take 1 tablet by mouth every morning (before breakfast)  Dispense: 90 tablet; Refill: 1  - TSH; Future    4. Depression, unspecified depression type  stable  - sertraline (ZOLOFT) 50 MG tablet; Take 1 tablet by mouth daily  Dispense: 90 tablet; Refill: 1    5. Vitamin D deficiency  Hx of Vitamin d deficiency. Currently taking 50,000 units weekly through rheumatology per pt  - Vitamin D 25 Hydroxy; Future    6. Back muscle spasm  stable  - cyclobenzaprine (FLEXERIL) 10 MG tablet; 1/2 po in the evening prn  Dispense: 30 tablet; Refill: 0    7. Postmenopausal  - DEXA BONE DENSITY AXIAL SKELETON; Future        Greater than 50% counseling and/or coordination of care: the treatment regimen is extensive; detailed review. Will notify of labs. Recheck in 3 months.  This note was dictated using dragon voice recognition software.  It has been proofread, but there may still exist voice recognition errors that the author did not detect.      Signed By: Erskine Squibb, APRN - CNP     April 01, 2023

## 2023-04-08 ENCOUNTER — Encounter: Payer: MEDICARE | Attending: Clinical | Primary: Nurse Practitioner

## 2023-04-15 ENCOUNTER — Encounter

## 2023-04-22 ENCOUNTER — Inpatient Hospital Stay: Admit: 2023-04-22 | Payer: MEDICARE | Primary: Nurse Practitioner

## 2023-04-22 DIAGNOSIS — Z780 Asymptomatic menopausal state: Secondary | ICD-10-CM

## 2023-04-22 NOTE — Other (Signed)
 Results to pt.   Dexa report:  IMPRESSION:  Bone density is considered low. Lumbar T score value fulfills criteria for  medical therapy. Recommend follow-up assessment in 2 years.    Is she still taking Calcium, Vit D, and Fosamax?  If so, please continue. If not, please let me know.   Thanks

## 2023-04-23 ENCOUNTER — Ambulatory Visit: Admit: 2023-04-23 | Discharge: 2023-04-28 | Payer: MEDICARE | Primary: Nurse Practitioner

## 2023-04-23 DIAGNOSIS — N281 Cyst of kidney, acquired: Secondary | ICD-10-CM

## 2023-04-27 ENCOUNTER — Encounter

## 2023-04-27 NOTE — Other (Signed)
 Results to pt please.   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 This is likely a simple cyst however not definitive  given imaging limitations. Clinical as well as imaging surveillance of this  finding is advised. I will refer to urology just to make sure everything is ok.  Thanks

## 2023-05-13 ENCOUNTER — Ambulatory Visit: Admit: 2023-05-13 | Discharge: 2023-05-13 | Payer: MEDICARE | Attending: Clinical | Primary: Nurse Practitioner

## 2023-05-13 ENCOUNTER — Encounter

## 2023-05-13 DIAGNOSIS — F4310 Post-traumatic stress disorder, unspecified: Secondary | ICD-10-CM

## 2023-05-13 NOTE — Progress Notes (Signed)
 Length of meeting; 60 minutes    Start Time: 1550    Stop Time:  1650    Diagnosis: Complex post traumatic stress disorder.  Adjustment disorder with anxiety.      Treatment Plan: Focus on codependency using bibliotherapy for guidelines; Focus on relaxation     Patient Prognosis: Guarded at present time.    Patient Progress: The patient indicates that "It has been a rough couple of months with some health issues that I am not comfortable with."  She has a cyst on her kidney, noting I I am not worried, but I am frustrated."      We talked about her top 3 treatment goals that she self selected, from a prior given list of 50.  They are as follows: Decreasing trying to be perfect, Feeling more self confident, and Learning how to relax.  The first and third one are discussed at length.      In terms of her first one, she notes that "I am a people pleaser".  When asked she notes that all of her brothers are alcoholics and both parents.  I have suggested a book for her to read and we will review that a bit next time.      Talked about her third goal of relaxation; talked with her about sensate focus, using the board in my office.      We will talk about her second goal next session.  She does note that she does deal with thinking that she is not good enough at times.      Mental Status exam: PHQ-9 is done with a score of 2.  GAD-7 is done with a score of 7.  Both are shared with the patient.  No current active SI or HI and continues to contract for safety.  Thought processes appear normal.  Speech is goal directed.  The patient does not appear to be responding to internal stimuli.      Plan: I will see this patient again in about one month.    Shelda Pal, LISW-CP

## 2023-05-23 ENCOUNTER — Ambulatory Visit: Admit: 2023-05-23 | Discharge: 2023-05-23 | Payer: MEDICARE | Attending: Urology | Primary: Nurse Practitioner

## 2023-05-23 DIAGNOSIS — N2889 Other specified disorders of kidney and ureter: Secondary | ICD-10-CM

## 2023-05-23 NOTE — Progress Notes (Signed)
 [facility]  [address]  [address]  [place], [place] [address]  [phone]    [name]  DOB: 1955/07/20    Chief Complaint   Patient presents with    kidney mass    New Patient        HPI     Sandra Harmon is a 68 y.o. female referred by Lamar Laundry Cothran-Pate for evaluation and treatment of renal mass. Pain management ordered MRI of back on 04-01-23: FINDINGS: For the purposes of this examination, there will be 5 lumbar-type  vertebral bodies with vertebral body numbering performed with the designation of  the last well formed intervertebral disc as L5-S1.     Minimal chronic degenerative endplate remodeling at L5-S1. Scattered small  vertebral body hemangiomas. No evidence of acute compression fracture or  suspicious osseous lesion.     The visualized portions of the distal spinal cord are of normal caliber and  signal characteristics.. The conus medullaris terminates at the L2. The cauda  equina is unremarkable.      Left renal cortical T2 hyperintensity, incompletely characterized but probably a  cyst.     There are a few prominent venous structures in the left mid abdomen, possibly  portal venous collaterals or a prominent left ovarian vein.     Level specific findings:     T12-L1: Unremarkable.     L1-L2: Small broad-based left subarticular protrusion, mildly narrowing the left  subarticular recess (axial T2-weighted image 3). No significant canal or  foraminal stenosis.     L2-L3: Unremarkable.     L3-L4: Retrolisthesis of L3 on L4. Mild bulging of the disc with a small  cephalad directed left central protrusion (axial T2-weighted image 12). This  results in mild left neuroforaminal narrowing. The right neural foramen is  patent. No significant canal stenosis.     L4-L5: Slight retrolisthesis of L4 on L5. Mild disc degeneration. No significant  canal or foraminal stenosis.     L5-S1: Facet arthropathy with bulging of the disc and small marginal  osteophytes. This results in mild  bilateral neuroforaminal narrowing. No  significant canal stenosis.           IMPRESSION:     1. Lumbar spine degenerative changes of greatest severity are as follows:  -Mild left subarticular recess narrowing at L1-L2 consequent to a small left  subarticular protrusion.  -Mild left neuroforaminal narrowing at L3-L4.     2. Lumbar spine degenerative findings are otherwise as fully detailed above,  without significant canal stenosis at any level.  RUS 04-23-23: IMPRESSION:  Findings/impression: Poorly evaluated hypoechoic mass of the left kidney  measuring 2.0 x 1.7 cm. This is likely a simple cyst however not definitive  given imaging limitations. Clinical as well as imaging surveillance of this  finding is advised.     No other suspicious renal masses. No hydronephrosis. Bladder is decompressed.  No hematuria or flank pain.     Past Medical History:   Diagnosis Date    Arthritis     Chronic back pain     Colon polyp 2015    Had 5 polyps negative for cancer    Hypertension     Hyperthyroidism     Kidney stone     Only happened  once    Liver disease     Thyroid disease     hypothyroid     Past Surgical History:   Procedure Laterality Date    APPENDECTOMY  CERVICAL DISCECTOMY  09/16/06    Anterior C5-C6 diskectomy with decompression;  anterior cervical arthrodesis interbody technique;  anterior cervical instrumentation;  tricortical iliac crest bone graft harvested through a separate incision.     CHOLECYSTECTOMY      DIAGNOSTIC CARDIAC CATH LAB PROCEDURE      HYSTERECTOMY, VAGINAL  1988    TAH AND BSO (CERVIX REMOVED)      TUBAL LIGATION       Current Outpatient Medications   Medication Sig Dispense Refill    alendronate (FOSAMAX) 70 MG tablet Take 1 tablet by mouth every 7 days 12 tablet 1    pravastatin (PRAVACHOL) 20 MG tablet Take 1 tablet by mouth daily 90 tablet 1    levothyroxine (SYNTHROID) 75 MCG tablet Take 1 tablet by mouth every morning (before breakfast) 90 tablet 1    sertraline (ZOLOFT) 50 MG tablet  Take 1 tablet by mouth daily 90 tablet 1    cyclobenzaprine (FLEXERIL) 10 MG tablet 1/2 po in the evening 30 tablet 0    albuterol sulfate HFA (PROVENTIL;VENTOLIN;PROAIR) 108 (90 Base) MCG/ACT inhaler Inhale 2 puffs into the lungs every 6 hours as needed for Wheezing 18 g 3    budesonide-formoterol (SYMBICORT) 160-4.5 MCG/ACT AERO Inhale 2 puffs into the lungs 2 times daily 30.6 g 1    fluticasone (FLONASE) 50 MCG/ACT nasal spray 2 sprays by Each Nostril route daily (Patient not taking: Reported on 05/23/2023) 16 g 0    aspirin 81 MG chewable tablet Take by mouth daily (Patient not taking: Reported on 05/23/2023)       No current facility-administered medications for this visit.     Allergies   Allergen Reactions    Atorvastatin Other (See Comments)     Muscle pain    Hydrocodone-Acetaminophen Itching    Penicillins Hives     Social History     Socioeconomic History    Marital status: Divorced     Spouse name: Not on file    Number of children: Not on file    Years of education: Not on file    Highest education level: Not on file   Occupational History    Not on file   Tobacco Use    Smoking status: Former     Current packs/day: 0.00     Average packs/day: 1 pack/day for 17.7 years (17.7 ttl pk-yrs)     Types: Cigarettes     Start date: 02/26/1979     Quit date: 10/25/1996     Years since quitting: 26.5     Passive exposure: Never    Smokeless tobacco: Former   Substance and Sexual Activity    Alcohol use: No    Drug use: No    Sexual activity: Not Currently     Partners: Male   Other Topics Concern    Not on file   Social History Narrative    Not on file     Social Drivers of Health     Financial Resource Strain: Low Risk  (06/20/2022)    Overall Financial Resource Strain (CARDIA)     Difficulty of Paying Living Expenses: Not hard at all   Food Insecurity: No Food Insecurity (03/29/2023)    Hunger Vital Sign     Worried About Running Out of Food in the Last Year: Never true     Ran Out of Food in the Last Year: Never true    Transportation Needs: No Transportation Needs (03/29/2023)    PRAPARE -  Therapist, art (Medical): No     Lack of Transportation (Non-Medical): No   Physical Activity: Inactive (12/31/2022)    Exercise Vital Sign     Days of Exercise per Week: 0 days     Minutes of Exercise per Session: 0 min   Stress: Not on file   Social Connections: Not on file   Intimate Partner Violence: Not on file   Housing Stability: Low Risk  (03/29/2023)    Housing Stability Vital Sign     Unable to Pay for Housing in the Last Year: No     Number of Times Moved in the Last Year: 0     Homeless in the Last Year: No     Family History   Problem Relation Age of Onset    Asthma Mother     Lung Disease Mother     High Blood Pressure Mother     Emphysema Mother     Heart Disease Mother     Hypertension Mother     Liver Disease Father     Alcohol Abuse Father     Early Death Sister     Stroke Brother     Diabetes Brother     Alcohol Abuse Brother     Early Death Brother     Heart Attack Brother     High Blood Pressure Brother     Substance Abuse Brother     Alcohol Abuse Brother     Diabetes Brother     High Blood Pressure Brother     Substance Abuse Brother     Breast Cancer Neg Hx        Review of Systems  Constitutional:   Negative for fever, chills, appetite change, malaise/fatigue, headaches and weight loss.  Skin:  Negative for skin lesions, rash and itching.  Eyes:  Negative for visual disturbance, eye pain and eye discharge.  ENT:  Negative for difficulty articulating words, pain swallowing, high frequency hearing loss and dry mouth.  Respiratory:  Negative for cough, blood in sputum, shortness of breath and wheezing.  Cardiovascular:  Negative for chest pain, hypertension, irregular heartbeat, leg pain, leg swelling, regular rate and rhythm and varicose veins.  GI:  Negative for nausea, vomiting, abdominal pain, blood in stool, constipation, diarrhea, indigestion and heartburn.  Genitourinary: Positive for history  of urolithiasis, nocturia, straining, leakage w/ urge, incomplete emptying and hysterectomy. Negative for urinary burning, hematuria, flank pain, recurrent UTIs, slower stream, urgency, frequent urination, erectile dysfunction, testicular pain, sexually transmitted disease, sexually transmitted disease, discharge, urethral stricture, menstrual problem, endometriosis and vaginal pain.Number of pregnancies: 4.  Number of births: 4.  Musculoskeletal:  Negative for back pain, bone pain, arthralgias, tenderness, muscle weakness and neck pain.  Neurological:  Negative for dizziness, focal weakness, numbness, seizures and tremors.  Psychological:  Negative for depression and psychiatric problem.  Endocrine:  Negative for cold intolerance, thirst, excessive urination, fatigue and heat intolerance.  Hem/Lymphatic:  Negative for easy bleeding, easy bruising and frequent infections.      There were no vitals taken for this visit.  Physical Exam  General   Mental Status - Patient is alert and oriented X3. Build & Nutrition - obese      Chest and Lung Exam   Chest and lung exam reveals  - normal excursion with symmetric chest walls, quiet, even and easy respiratory effort with no use of accessory muscles and on auscultation, normal breath sounds, no adventitious sounds and  normal vocal resonance.      Cardiovascular   Cardiovascular examination reveals  - normal heart sounds, regular rate and rhythm with no murmurs.      Abdomen   Palpation/Percussion: Palpation and Percussion of the abdomen reveal - Non Tender, No Rebound tenderness, No Rigidity (guarding), No hepatosplenomegaly, No Palpable abdominal masses and Soft. Hernia - Bilateral - No Hernia(s) present.    Urinalysis  UA - Dipstick  No results found for this or any previous visit.    UA - Micro  WBC - 0  RBC - 0  Bacteria - 0  Epith - 0      Assessment and Plan    Norlene was seen today for kidney mass and new patient.    Diagnoses and all orders for this visit:    Left  kidney mass  -     [place] RETROPERITONEAL COMPLETE; Future     Likely has a small left renal simple cyst. Will follow up with [place] in 6 months.     Follow-up and Dispositions    Return in about 6 months (around 11/23/2023) for renal US before.

## 2023-05-27 ENCOUNTER — Encounter

## 2023-05-27 ENCOUNTER — Ambulatory Visit
Admit: 2023-05-27 | Discharge: 2023-05-27 | Payer: MEDICARE | Attending: Nurse Practitioner | Primary: Nurse Practitioner

## 2023-05-27 VITALS — BP 128/75 | HR 82 | Ht 59.0 in | Wt 185.0 lb

## 2023-05-27 DIAGNOSIS — R7309 Other abnormal glucose: Secondary | ICD-10-CM

## 2023-05-27 LAB — AMB POC GLUCOSE BLOOD, BY GLUCOSE MONITORING DEVICE: Glucose, POC: 65 mg/dL

## 2023-05-27 MED ORDER — MECLIZINE HCL 25 MG PO TABS
25 | ORAL_TABLET | Freq: Two times a day (BID) | ORAL | 0 refills | Status: AC | PRN
Start: 2023-05-27 — End: 2023-06-26

## 2023-05-27 NOTE — Progress Notes (Signed)
 [facility]  [address]  [place], [place] [address]   (ph) [phone] (fax) [phone]  Gracyn Santillanes L. Cothran-Pate APRN, FNP-C      Chief Complaint   Patient presents with    Dizziness     Foggy head and dizziness. Pain in back of eye that gi towards head        68 yo female comes in c/o feeling foggy headed and dizziness. Pain in back of eye that goes  towards head She has seen Greggory Stallion and thinks he will be able to help her. She is still having back pain and is under pain management.  She has not taken the Antivert in a while. Last head CT in 02/24 revealed   IMPRESSION:  No CT evidence of acute intracranial abnormality. Reports it is time for an eye exam.       Dizziness      Allergies   Allergen Reactions    Atorvastatin Other (See Comments)     Muscle pain    Hydrocodone-Acetaminophen Itching    Penicillins Hives       Past Medical History:   Diagnosis Date    Arthritis     Chronic back pain     Colon polyp 2015    Had 5 polyps negative for cancer    Hypertension     Hyperthyroidism     Kidney stone     Only happened  once    Liver disease     Thyroid disease     hypothyroid       Family History   Problem Relation Age of Onset    Asthma Mother     Lung Disease Mother     High Blood Pressure Mother     Emphysema Mother     Heart Disease Mother     Hypertension Mother     Liver Disease Father     Alcohol Abuse Father     Early Death Sister     Stroke Brother     Diabetes Brother     Alcohol Abuse Brother     Early Death Brother     Heart Attack Brother     High Blood Pressure Brother     Substance Abuse Brother     Alcohol Abuse Brother     Diabetes Brother     High Blood Pressure Brother     Substance Abuse Brother     Breast Cancer Neg Hx        Social History     Socioeconomic History    Marital status: Divorced     Spouse name: Not on file    Number of children: Not on file    Years of education: Not on file    Highest education level: Not on file   Occupational History     Not on file   Tobacco Use    Smoking status: Former     Current packs/day: 0.00     Average packs/day: 1 pack/day for 17.7 years (17.7 ttl pk-yrs)     Types: Cigarettes     Start date: 02/26/1979     Quit date: 10/25/1996     Years since quitting: 26.6     Passive exposure: Never    Smokeless tobacco: Former   Substance and Sexual Activity    Alcohol use: No    Drug use: No    Sexual activity: Not Currently     Partners: Male   Other  Topics Concern    Not on file   Social History Narrative    Not on file     Social Drivers of Health     Financial Resource Strain: Low Risk  (06/20/2022)    Overall Financial Resource Strain (CARDIA)     Difficulty of Paying Living Expenses: Not hard at all   Food Insecurity: No Food Insecurity (03/29/2023)    Hunger Vital Sign     Worried About Running Out of Food in the Last Year: Never true     Ran Out of Food in the Last Year: Never true   Transportation Needs: No Transportation Needs (03/29/2023)    PRAPARE - Therapist, art (Medical): No     Lack of Transportation (Non-Medical): No   Physical Activity: Inactive (12/31/2022)    Exercise Vital Sign     Days of Exercise per Week: 0 days     Minutes of Exercise per Session: 0 min   Stress: Not on file   Social Connections: Not on file   Intimate Partner Violence: Not on file   Housing Stability: Low Risk  (03/29/2023)    Housing Stability Vital Sign     Unable to Pay for Housing in the Last Year: No     Number of Times Moved in the Last Year: 0     Homeless in the Last Year: No       OB History    No obstetric history on file.         Past Surgical History:   Procedure Laterality Date    APPENDECTOMY      CERVICAL DISCECTOMY  09/16/06    Anterior C5-C6 diskectomy with decompression;  anterior cervical arthrodesis interbody technique;  anterior cervical instrumentation;  tricortical iliac crest bone graft harvested through a separate incision.     CHOLECYSTECTOMY      DIAGNOSTIC CARDIAC CATH LAB PROCEDURE       HYSTERECTOMY, VAGINAL  1988    TAH AND BSO (CERVIX REMOVED)      TUBAL LIGATION         Health Maintenance   Topic Date Due    Respiratory Syncytial Virus (RSV) Pregnant or age 37 yrs+ (1 - Risk 60-74 years 1-dose series) Never done    COVID-19 Vaccine (5 - 2024-25 season) 10/27/2022    Annual Wellness Visit (Medicare Advantage)  02/26/2023    Colorectal Cancer Screen  11/27/2023    Lipids  03/31/2024    Depression Monitoring  05/12/2024    Breast cancer screen  01/03/2025    Diabetes screen  12/30/2025    DTaP/Tdap/Td vaccine (3 - Td or Tdap) 10/02/2032    DEXA (modify frequency per FRAX score)  Completed    Flu vaccine  Completed    Shingles vaccine  Completed    Pneumococcal 50+ years Vaccine  Completed    Hepatitis C screen  Completed    Hepatitis A vaccine  Aged Out    Hepatitis B vaccine  Aged Out    Hib vaccine  Aged Out    Polio vaccine  Aged Out    Meningococcal (ACWY) vaccine  Aged Out    Meningococcal B vaccine  Aged Out    Depression Screen  Discontinued    Pneumococcal 0-49 years Vaccine  Discontinued    Cervical cancer screen  Discontinued         Current Outpatient Medications:     alendronate (FOSAMAX) 70 MG tablet, Take 1 tablet  by mouth every 7 days, Disp: 12 tablet, Rfl: 1    pravastatin (PRAVACHOL) 20 MG tablet, Take 1 tablet by mouth daily, Disp: 90 tablet, Rfl: 1    levothyroxine (SYNTHROID) 75 MCG tablet, Take 1 tablet by mouth every morning (before breakfast), Disp: 90 tablet, Rfl: 1    sertraline (ZOLOFT) 50 MG tablet, Take 1 tablet by mouth daily, Disp: 90 tablet, Rfl: 1    cyclobenzaprine (FLEXERIL) 10 MG tablet, 1/2 po in the evening, Disp: 30 tablet, Rfl: 0    albuterol sulfate HFA (PROVENTIL;VENTOLIN;PROAIR) 108 (90 Base) MCG/ACT inhaler, Inhale 2 puffs into the lungs every 6 hours as needed for Wheezing, Disp: 18 g, Rfl: 3    budesonide-formoterol (SYMBICORT) 160-4.5 MCG/ACT AERO, Inhale 2 puffs into the lungs 2 times daily (Patient not taking: Reported on 05/27/2023), Disp: 30.6 g, Rfl:  1    fluticasone (FLONASE) 50 MCG/ACT nasal spray, 2 sprays by Each Nostril route daily (Patient not taking: Reported on 05/27/2023), Disp: 16 g, Rfl: 0    aspirin 81 MG chewable tablet, Take by mouth daily (Patient not taking: Reported on 12/31/2022), Disp: , Rfl:     Review of Systems   Constitutional: Negative.    HENT: Negative.     Eyes: Negative.    Respiratory: Negative.     Cardiovascular: Negative.    Gastrointestinal: Negative.    Endocrine: Negative.    Genitourinary: Negative.    Musculoskeletal: Negative.    Skin: Negative.    Allergic/Immunologic: Negative.    Neurological:  Positive for dizziness.   Hematological: Negative.    Psychiatric/Behavioral: Negative.          Vitals:    05/27/23 1625   BP: 128/75   Pulse: 82   SpO2: 99%        Physical Exam  Constitutional:       Appearance: Normal appearance.   HENT:      Head: Normocephalic.      Nose: Nose normal.   Eyes:      Extraocular Movements: Extraocular movements intact.      Pupils: Pupils are equal, round, and reactive to light.   Cardiovascular:      Rate and Rhythm: Normal rate and regular rhythm.   Pulmonary:      Effort: Pulmonary effort is normal.      Breath sounds: Normal breath sounds.   Abdominal:      General: Abdomen is flat.      Palpations: Abdomen is soft.   Musculoskeletal:         General: Normal range of motion.      Cervical back: Normal range of motion and neck supple.   Skin:     General: Skin is warm and dry.   Neurological:      General: No focal deficit present.      Mental Status: She is alert and oriented to person, place, and time.   Psychiatric:         Mood and Affect: Mood normal.         Behavior: Behavior normal.                05/27/2023     4:29 PM 05/27/2023     4:28 PM 05/13/2023     3:58 PM 03/18/2023    10:01 AM 12/31/2022     8:06 AM 12/28/2022    10:48 AM 11/07/2022     2:54 PM   PHQ Scores   PHQ2 Score 0 0 0  1  0  0   PHQ9 Score 2 0 2 4 4  0  6       Patient-reported        Assessment & Plan:    1. Elevated  glucose  Glucose=65  - AMB POC GLUCOSE BLOOD, BY GLUCOSE MONITORING DEVICE    2. Dizziness  - meclizine (ANTIVERT) 25 MG tablet; Take 1 tablet by mouth 2 times daily as needed for Dizziness  Dispense: 30 tablet; Refill: 0        Greater than 50% counseling and/or coordination of care: the treatment regimen is extensive; detailed review. She will keep a BP diary during vertigo episodes and she will have her eyes checked. She has an upcoming appt in near future.  This note was dictated using dragon voice recognition software.  It has been proofread, but there may still exist voice recognition errors that the author did not detect.      Signed By: Erskine Squibb, APRN - CNP     May 27, 2023

## 2023-06-16 ENCOUNTER — Ambulatory Visit: Admit: 2023-06-16 | Discharge: 2023-06-16 | Payer: MEDICARE | Attending: Clinical | Primary: Nurse Practitioner

## 2023-06-16 DIAGNOSIS — F4310 Post-traumatic stress disorder, unspecified: Secondary | ICD-10-CM

## 2023-06-16 NOTE — Progress Notes (Signed)
 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  CBT for interpretation of other's behavior toward her.    Patient Prognosis: Guarded at present time.    Patient Progress: The patient indicates that "I have been depressed."  She notes some conflicts with a family members which are dicussed today.  Also notes stressors with her health that we also discussed.          We talked about her remaining tx goal which is: Feeling more self confident.  Talked about steps in getting to this point.        I followed up on the issues identified in the prior session of the patient having some features of codependency with respect to a book I had suggested for her.  She has gotten it but has not read it at this point.   Plans to do so in the near future.    Followed up on her use of sensate focus, related to her goal of learning how to relax, again, in prior session.   She is doing this and finds it helpful.       Mental Status exam: PHQ-9 is done with a score of 12.  GAD-7 is done with a score of 16.  Both are shared with the patient.  No current active SI or HI and continues to contract for safety.  Thought processes appear normal.  Speech is goal directed.  The patient does not appear to be responding to internal stimuli.      Plan: I will see this patient again in about one month.    Mar Semen, LISW-CP

## 2023-06-24 ENCOUNTER — Encounter

## 2023-07-02 ENCOUNTER — Encounter

## 2023-07-02 ENCOUNTER — Ambulatory Visit
Admit: 2023-07-02 | Discharge: 2023-07-02 | Payer: Medicare (Managed Care) | Attending: Nurse Practitioner | Primary: Nurse Practitioner

## 2023-07-02 VITALS — BP 137/81 | HR 85 | Wt 181.0 lb

## 2023-07-02 DIAGNOSIS — E039 Hypothyroidism, unspecified: Secondary | ICD-10-CM

## 2023-07-02 LAB — POTASSIUM: Potassium: 3.5 mmol/L (ref 3.5–5.1)

## 2023-07-02 MED ORDER — PRAVASTATIN SODIUM 20 MG PO TABS
20 | ORAL_TABLET | Freq: Every day | ORAL | 1 refills | 90.00000 days | Status: DC
Start: 2023-07-02 — End: 2023-10-02

## 2023-07-02 MED ORDER — SERTRALINE HCL 50 MG PO TABS
50 | ORAL_TABLET | Freq: Every day | ORAL | 1 refills | 90.00000 days | Status: DC
Start: 2023-07-02 — End: 2023-10-02

## 2023-07-02 MED ORDER — LEVOTHYROXINE SODIUM 75 MCG PO TABS
75 | ORAL_TABLET | Freq: Every day | ORAL | 1 refills | 90.00000 days | Status: DC
Start: 2023-07-02 — End: 2023-10-02

## 2023-07-02 MED ORDER — CYCLOBENZAPRINE HCL 10 MG PO TABS
10 | ORAL_TABLET | ORAL | 0 refills | 14.00000 days | Status: DC
Start: 2023-07-02 — End: 2023-10-02

## 2023-07-02 MED ORDER — ALBUTEROL SULFATE HFA 108 (90 BASE) MCG/ACT IN AERS
108 | Freq: Four times a day (QID) | RESPIRATORY_TRACT | 3 refills | 25.00000 days | Status: DC | PRN
Start: 2023-07-02 — End: 2023-10-02

## 2023-07-02 NOTE — Progress Notes (Signed)
 [facility]  [address]  [place], [place] [address]   (ph) [phone] (fax) [phone]  Shlonda Dolloff L. Cothran-Pate APRN, FNP-C      Chief Complaint   Patient presents with    3 Month Follow-Up    Edema     Swelling in feet an ankles noticed 3 weeks ago.       68 yo female comes in for her 3 month follow up. She is still seeing Caretha Chapel since last here and feels he is helping her. She is still having back pain occasionally; however, is not  under pain management. She is still seeing rheumatology.       Edema            Allergies   Allergen Reactions    Atorvastatin Other (See Comments)     Muscle pain    Hydrocodone-Acetaminophen  Itching    Penicillins Hives       Past Medical History:   Diagnosis Date    Arthritis     Chronic back pain     Colon polyp 2015    Had 5 polyps negative for cancer    Hypertension     Hyperthyroidism     Kidney stone     Only happened  once    Liver disease     Thyroid disease     hypothyroid       Family History   Problem Relation Age of Onset    Asthma Mother     Lung Disease Mother     High Blood Pressure Mother     Emphysema Mother     Heart Disease Mother     Hypertension Mother     Liver Disease Father     Alcohol Abuse Father     Early Death Sister     Stroke Brother     Diabetes Brother     Alcohol Abuse Brother     Early Death Brother     Heart Attack Brother     High Blood Pressure Brother     Substance Abuse Brother     Alcohol Abuse Brother     Diabetes Brother     High Blood Pressure Brother     Substance Abuse Brother     Breast Cancer Neg Hx        Social History     Socioeconomic History    Marital status: Divorced     Spouse name: Not on file    Number of children: Not on file    Years of education: Not on file    Highest education level: Not on file   Occupational History    Not on file   Tobacco Use    Smoking status: Former     Current packs/day: 0.00     Average packs/day: 1 pack/day for 17.7 years (17.7 ttl pk-yrs)     Types:  Cigarettes     Start date: 02/26/1979     Quit date: 10/25/1996     Years since quitting: 26.7     Passive exposure: Never    Smokeless tobacco: Former   Substance and Sexual Activity    Alcohol use: No    Drug use: No    Sexual activity: Not Currently     Partners: Male   Other Topics Concern    Not on file   Social History Narrative    Not on file     Social Drivers of Health  Financial Resource Strain: Low Risk  (06/20/2022)    Overall Financial Resource Strain (CARDIA)     Difficulty of Paying Living Expenses: Not hard at all   Food Insecurity: No Food Insecurity (03/29/2023)    Hunger Vital Sign     Worried About Running Out of Food in the Last Year: Never true     Ran Out of Food in the Last Year: Never true   Transportation Needs: No Transportation Needs (03/29/2023)    PRAPARE - Therapist, art (Medical): No     Lack of Transportation (Non-Medical): No   Physical Activity: Inactive (12/31/2022)    Exercise Vital Sign     Days of Exercise per Week: 0 days     Minutes of Exercise per Session: 0 min   Stress: Not on file   Social Connections: Not on file   Intimate Partner Violence: Not on file   Housing Stability: Low Risk  (03/29/2023)    Housing Stability Vital Sign     Unable to Pay for Housing in the Last Year: No     Number of Times Moved in the Last Year: 0     Homeless in the Last Year: No       OB History    No obstetric history on file.         Past Surgical History:   Procedure Laterality Date    APPENDECTOMY      CERVICAL DISCECTOMY  09/16/06    Anterior C5-C6 diskectomy with decompression;  anterior cervical arthrodesis interbody technique;  anterior cervical instrumentation;  tricortical iliac crest bone graft harvested through a separate incision.     CHOLECYSTECTOMY      DIAGNOSTIC CARDIAC CATH LAB PROCEDURE      HYSTERECTOMY, VAGINAL  1988    TAH AND BSO (CERVIX REMOVED)      TUBAL LIGATION         Health Maintenance   Topic Date Due    Respiratory Syncytial Virus (RSV)  Pregnant or age 55 yrs+ (1 - Risk 60-74 years 1-dose series) Never done    COVID-19 Vaccine (5 - 2024-25 season) 10/27/2022    Annual Wellness Visit (Medicare Advantage)  02/26/2023    Colorectal Cancer Screen  11/27/2023    Lipids  03/31/2024    Depression Monitoring  06/12/2024    Breast cancer screen  01/03/2025    Diabetes screen  12/30/2025    DTaP/Tdap/Td vaccine (3 - Td or Tdap) 10/02/2032    DEXA (modify frequency per FRAX score)  Completed    Flu vaccine  Completed    Shingles vaccine  Completed    Pneumococcal 50+ years Vaccine  Completed    Hepatitis C screen  Completed    Hepatitis A vaccine  Aged Out    Hepatitis B vaccine  Aged Out    Hib vaccine  Aged Out    Polio vaccine  Aged Out    Meningococcal (ACWY) vaccine  Aged Out    Meningococcal B vaccine  Aged Out    Depression Screen  Discontinued    Pneumococcal 0-49 years Vaccine  Discontinued    Cervical cancer screen  Discontinued           Current Outpatient Medications:     predniSONE (DELTASONE) 5 MG tablet, Take 1 tablet by mouth daily, Disp: , Rfl:     alendronate  (FOSAMAX ) 70 MG tablet, Take 1 tablet by mouth every 7 days, Disp: 12 tablet, Rfl: 1  pravastatin  (PRAVACHOL ) 20 MG tablet, Take 1 tablet by mouth daily, Disp: 90 tablet, Rfl: 1    levothyroxine  (SYNTHROID ) 75 MCG tablet, Take 1 tablet by mouth every morning (before breakfast), Disp: 90 tablet, Rfl: 1    sertraline  (ZOLOFT ) 50 MG tablet, Take 1 tablet by mouth daily, Disp: 90 tablet, Rfl: 1    cyclobenzaprine  (FLEXERIL ) 10 MG tablet, 1/2 po in the evening, Disp: 30 tablet, Rfl: 0    albuterol  sulfate HFA (PROVENTIL ;VENTOLIN ;PROAIR ) 108 (90 Base) MCG/ACT inhaler, Inhale 2 puffs into the lungs every 6 hours as needed for Wheezing, Disp: 18 g, Rfl: 3    aspirin 81 MG chewable tablet, Take by mouth daily, Disp: , Rfl:     Review of Systems   Constitutional: Negative.    HENT: Negative.     Eyes: Negative.    Respiratory: Negative.     Cardiovascular: Negative.    Gastrointestinal:  Negative.    Endocrine: Negative.    Genitourinary: Negative.    Musculoskeletal: Negative.    Skin: Negative.    Allergic/Immunologic: Negative.    Neurological: Negative.    Hematological: Negative.    Psychiatric/Behavioral: Negative.          Vitals:    07/02/23 0808   BP: 137/81   Pulse: 85   SpO2: 96%        Physical Exam  Constitutional:       Appearance: Normal appearance.   HENT:      Head: Normocephalic.      Nose: Nose normal.   Eyes:      Extraocular Movements: Extraocular movements intact.      Pupils: Pupils are equal, round, and reactive to light.   Cardiovascular:      Rate and Rhythm: Normal rate and regular rhythm.   Pulmonary:      Effort: Pulmonary effort is normal.      Breath sounds: Normal breath sounds.   Abdominal:      General: Abdomen is flat.      Palpations: Abdomen is soft.   Musculoskeletal:         General: Normal range of motion.      Cervical back: Normal range of motion and neck supple.   Skin:     General: Skin is warm and dry.   Neurological:      General: No focal deficit present.      Mental Status: She is alert and oriented to person, place, and time.   Psychiatric:         Mood and Affect: Mood normal.         Behavior: Behavior normal.                07/02/2023     8:09 AM 06/13/2023    11:11 AM 05/27/2023     4:29 PM 05/27/2023     4:28 PM 05/13/2023     3:58 PM 03/18/2023    10:01 AM 12/31/2022     8:06 AM   PHQ Scores   PHQ2 Score 2 5  0 0 0 1    PHQ9 Score 4 12  2  0 2 4 4        Patient-reported        Assessment & Plan:    1. Hypothyroidism, unspecified type  stable  - levothyroxine  (SYNTHROID ) 75 MCG tablet; Take 1 tablet by mouth every morning (before breakfast)  Dispense: 90 tablet; Refill: 1    2. Osteoporosis, unspecified osteoporosis type, unspecified pathological fracture  presence  Continue med    3. Back muscle spasm  stable  - cyclobenzaprine  (FLEXERIL ) 10 MG tablet; 1/2 po in the evening  Dispense: 30 tablet; Refill: 0    4. Mixed hyperlipidemia  Stable; continue med  -  pravastatin  (PRAVACHOL ) 20 MG tablet; Take 1 tablet by mouth daily  Dispense: 90 tablet; Refill: 1    5. Depression, unspecified depression type  stable  - sertraline  (ZOLOFT ) 50 MG tablet; Take 1 tablet by mouth daily  Dispense: 90 tablet; Refill: 1    6. Hypokalemia  - Potassium; Future      Greater than 50% counseling and/or coordination of care: the treatment regimen is extensive; detailed review. Will notify of labs. Recheck in 3 months. This note was dictated using dragon voice recognition software.  It has been proofread, but there may still exist voice recognition errors that the author did not detect.      Signed By: Hildy Lowers, APRN - CNP     Jul 02, 2023

## 2023-07-02 NOTE — Other (Signed)
Results to patient please.  Potassium is now normal.  Thanks

## 2023-07-08 ENCOUNTER — Inpatient Hospital Stay
Admit: 2023-07-08 | Discharge: 2023-07-08 | Disposition: A | Discharge status: Home / Self Care | Payer: Worker's Compensation | Attending: Emergency Medicine

## 2023-07-08 ENCOUNTER — Emergency Department: Admit: 2023-07-08 | Payer: Worker's Compensation | Primary: Nurse Practitioner

## 2023-07-08 DIAGNOSIS — S60811A Abrasion of right wrist, initial encounter: Secondary | ICD-10-CM

## 2023-07-08 MED ORDER — ACETAMINOPHEN 500 MG PO TABS
500 | ORAL | Status: AC
Start: 2023-07-08 — End: 2023-07-08
  Administered 2023-07-08: 15:00:00 1000 mg via ORAL

## 2023-07-08 MED FILL — ACETAMINOPHEN EXTRA STRENGTH 500 MG PO TABS: 500 MG | ORAL | Qty: 2 | Fill #0

## 2023-07-08 NOTE — ED Provider Notes (Signed)
 Emergency Department Provider Note       PCP: Cothran-Pate, Norville Beery, APRN - CNP   Age: 68 y.o.   Sex: female     DISPOSITION Decision To Discharge 07/08/2023 11:45:05 AM   DISPOSITION CONDITION Stable            ICD-10-CM    1. Abrasion of right wrist, initial encounter  S60.811A       2. Acute pain of right knee  M25.561           Medical Decision Making     I will get x-rays of the wrist and knee.     1 acute complicated illness or injury.  Shared medical decision making was utilized in creating the patients health plan today.    I independently ordered and reviewed each unique test.       I interpreted the X-rays no obvious bony abnormality in the wrist or knee.                Her x-rays are unremarkable.  I will discharge her home.    History     68 year old lady presents with concerns about pain in her right wrist and right knee after a fall while at work.  She says she was able to get up and walk around but it is uncomfortable.  She denies any head or neck injury.  She has no weakness or numbness.    No other associated symptoms.    Elements of this note were created using speech recognition software.  As such, errors of speech recognition may be present.        ROS     Review of Systems   Constitutional:  Negative for chills and fever.   Respiratory:  Negative for cough and shortness of breath.    Musculoskeletal:  Positive for arthralgias. Negative for joint swelling and neck pain.   Skin:  Positive for color change and wound.   Neurological:  Negative for headaches.        Physical Exam     Vitals signs and nursing note reviewed:  Vitals:    07/08/23 1027   BP: (!) 214/90   Pulse: 96   Resp: 18   Temp: 98.2 F (36.8 C)   SpO2: 95%   Weight: 80.7 kg (178 lb)   Height: 1.524 m (5')      Physical Exam  Constitutional:       Appearance: Normal appearance.   HENT:      Head: Normocephalic and atraumatic.   Cardiovascular:      Rate and Rhythm: Normal rate and regular rhythm.   Pulmonary:      Effort:  Pulmonary effort is normal.      Breath sounds: Normal breath sounds.   Musculoskeletal:      Comments: Full range of motion in the right knee with no ligamentous instability.    Full range of motion of the right wrist with a little pain and some mild swelling over the ulnar styloid.   Skin:     Comments: Minor abrasion over the ulnar styloid   Neurological:      General: No focal deficit present.      Mental Status: She is alert and oriented to person, place, and time.        Procedures     Procedures    Orders Placed This Encounter   Procedures    XR WRIST RIGHT (MIN 3 VIEWS)    XR KNEE RIGHT (  3 VIEWS)        Medications given during this emergency department visit:  Medications   acetaminophen  (TYLENOL ) tablet 1,000 mg (1,000 mg Oral Given 07/08/23 1108)       New Prescriptions    No medications on file        Past Medical History:   Diagnosis Date    Arthritis     Chronic back pain     Colon polyp 2015    Had 5 polyps negative for cancer    Hypertension     Hyperthyroidism     Kidney stone     Only happened  once    Liver disease     Thyroid disease     hypothyroid        Past Surgical History:   Procedure Laterality Date    APPENDECTOMY      CERVICAL DISCECTOMY  09/16/06    Anterior C5-C6 diskectomy with decompression;  anterior cervical arthrodesis interbody technique;  anterior cervical instrumentation;  tricortical iliac crest bone graft harvested through a separate incision.     CHOLECYSTECTOMY      DIAGNOSTIC CARDIAC CATH LAB PROCEDURE      HYSTERECTOMY, VAGINAL  1988    TAH AND BSO (CERVIX REMOVED)      TUBAL LIGATION          Social History     Socioeconomic History    Marital status: Divorced     Spouse name: None    Number of children: None    Years of education: None    Highest education level: None   Tobacco Use    Smoking status: Former     Current packs/day: 0.00     Average packs/day: 1 pack/day for 17.7 years (17.7 ttl pk-yrs)     Types: Cigarettes     Start date: 02/26/1979     Quit date: 10/25/1996      Years since quitting: 26.7     Passive exposure: Never    Smokeless tobacco: Former   Substance and Sexual Activity    Alcohol use: No    Drug use: No    Sexual activity: Not Currently     Partners: Male     Social Drivers of Health     Financial Resource Strain: Low Risk  (06/20/2022)    Overall Financial Resource Strain (CARDIA)     Difficulty of Paying Living Expenses: Not hard at all   Food Insecurity: No Food Insecurity (03/29/2023)    Hunger Vital Sign     Worried About Running Out of Food in the Last Year: Never true     Ran Out of Food in the Last Year: Never true   Transportation Needs: No Transportation Needs (03/29/2023)    PRAPARE - Therapist, art (Medical): No     Lack of Transportation (Non-Medical): No   Physical Activity: Inactive (12/31/2022)    Exercise Vital Sign     Days of Exercise per Week: 0 days     Minutes of Exercise per Session: 0 min   Housing Stability: Low Risk  (03/29/2023)    Housing Stability Vital Sign     Unable to Pay for Housing in the Last Year: No     Number of Times Moved in the Last Year: 0     Homeless in the Last Year: No        Previous Medications    ALBUTEROL  SULFATE HFA (PROVENTIL ;VENTOLIN ;PROAIR ) 108 (90 BASE) MCG/ACT INHALER  Inhale 2 puffs into the lungs every 6 hours as needed for Wheezing    ALENDRONATE  (FOSAMAX ) 70 MG TABLET    Take 1 tablet by mouth every 7 days    ASPIRIN 81 MG CHEWABLE TABLET    Take by mouth daily    CYCLOBENZAPRINE  (FLEXERIL ) 10 MG TABLET    1/2 po in the evening    LEVOTHYROXINE  (SYNTHROID ) 75 MCG TABLET    Take 1 tablet by mouth every morning (before breakfast)    PRAVASTATIN  (PRAVACHOL ) 20 MG TABLET    Take 1 tablet by mouth daily    PREDNISONE (DELTASONE) 5 MG TABLET    Take 1 tablet by mouth daily    SERTRALINE  (ZOLOFT ) 50 MG TABLET    Take 1 tablet by mouth daily        Results for orders placed or performed during the hospital encounter of 07/08/23   XR WRIST RIGHT (MIN 3 VIEWS)    Narrative    Right  Wrist    INDICATION: Right wrist pain after a fall    COMPARISON:  None    TECHNIQUE: Two views of the right wrist were obtained.    FINDINGS: There is no evidence of fracture or dislocation. No bony erosions or  lesions are seen. The distal radius and carpal bones are intact.      Impression    Negative right wrist      Electronically signed by Loleta Ripa   XR KNEE RIGHT (3 VIEWS)    Narrative    Right Knee    INDICATION: right knee injury and pain    COMPARISON:  None    TECHNIQUE: Three views of the right knee were obtained    FINDINGS:  There is no evidence of fracture or other acute bony abnormality. No  bony lesions are seen. There is no joint effusion. No definite narrowing medial  joint compartment but there is some sclerosis of the medial tibial plateau. No  chondrocalcinosis..      Impression    No acute pathology right knee      Electronically signed by Luverne Salvia         XR WRIST RIGHT (MIN 3 VIEWS)   Final Result   Negative right wrist         Electronically signed by Loleta Ripa      XR KNEE RIGHT (3 VIEWS)   Final Result   No acute pathology right knee         Electronically signed by Luverne Salvia                   No results for input(s): "COVID19" in the last 72 hours.    Voice dictation software was used during the making of this note.  This software is not perfect and grammatical and other typographical errors may be present.  This note has not been completely proofread for errors.        Chilton Couch, MD  07/08/23 [phone]

## 2023-07-08 NOTE — Discharge Instructions (Signed)
 Keep your wound clean with soap and water and apply an antibacterial ointment and clean bandage twice daily.    Please return with any fevers, confusion, worsening symptoms, or additional concerns.

## 2023-07-08 NOTE — ED Triage Notes (Signed)
 Pt ambulatory to triage. Pt reports slipping on rug while at work, falling, and injuring right wrist. Also c/o right knee pain.

## 2023-07-08 NOTE — ED Notes (Signed)
 Patient mobility status  with no difficulty.     I have reviewed discharge instructions with the patient.  The patient verbalized understanding.    Patient left ED via Discharge Method: ambulatory to Home with  self .    Opportunity for questions and clarification provided.     Patient given 0 scripts.

## 2023-07-16 ENCOUNTER — Ambulatory Visit
Admit: 2023-07-16 | Discharge: 2023-07-16 | Payer: Medicare (Managed Care) | Attending: Clinical | Primary: Nurse Practitioner

## 2023-07-16 DIAGNOSIS — F4310 Post-traumatic stress disorder, unspecified: Secondary | ICD-10-CM

## 2023-07-16 NOTE — Progress Notes (Signed)
 Length of meeting; 42 minutes    Start Time: 1555    Stop Time:  1637    Diagnosis: Complex post traumatic stress disorder.  Adjustment disorder with anxiety.      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  CBT for interpretation of other's behavior toward her.    Patient Prognosis: Fair.    Patient Progress: The patient indicates that "It's been some up and down days."  She discussed some stressors at work and at home. These are processed.  Talked about her responses being based on her own values and in the case of work, based on work requirements.      She talked about some of the other changes in her life which were also discussed and reinforced. She is engaging more in self care activities which is also reinforced.        Mental Status exam: PHQ-9 is done with a score of 4.  GAD-7 is done with a score of 4.  Both are shared with the patient.  No current active SI or HI and continues to contract for safety.  Thought processes appear normal.  Speech is goal directed.  The patient does not appear to be responding to internal stimuli.      Plan: I will see this patient again in about one  month.    Mar Semen, LISW-CP

## 2023-08-19 ENCOUNTER — Encounter: Payer: Medicare (Managed Care) | Attending: Clinical

## 2023-08-19 NOTE — Progress Notes (Unsigned)
 Length of meeting;     minutes    Start Time:     Stop Time:      Diagnosis: Complex post traumatic stress disorder.  Adjustment disorder with anxiety.      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  CBT for interpretation of other's behavior toward her.    Patient Prognosis: Fair.    Patient Progress: The patient indicates that       .        Mental Status exam: PHQ-9 is done with a score of    .  GAD-7 is done with a score of     .  Both are shared with the patient.  No current active SI or HI and continues to contract for safety.  Thought processes appear normal.  Speech is goal directed.  The patient does not appear to be responding to internal stimuli.      Plan: I will see this patient again in about one  month.    Mar Semen, LISW-CP

## 2023-08-21 ENCOUNTER — Encounter

## 2023-08-24 ENCOUNTER — Encounter

## 2023-08-25 ENCOUNTER — Encounter

## 2023-08-25 NOTE — Telephone Encounter (Signed)
 Next appt 10/02/23

## 2023-08-25 NOTE — Telephone Encounter (Signed)
 Last appt 07/02/23

## 2023-09-08 NOTE — Telephone Encounter (Signed)
 Called patient and left VM letting her know it was time to schedule her next appointment with Dr. Aurora and to call the office to schedule. Sending letter/mychart message      Return in about 1 year (around 12/27/2023) for After CT, Dr. Aurora or NP Coutu.

## 2023-09-16 ENCOUNTER — Encounter: Payer: Medicare (Managed Care) | Attending: Clinical

## 2023-10-02 ENCOUNTER — Encounter

## 2023-10-02 ENCOUNTER — Ambulatory Visit: Admit: 2023-10-02 | Discharge: 2023-10-02 | Payer: Medicare (Managed Care) | Attending: Nurse Practitioner

## 2023-10-02 DIAGNOSIS — F32A Depression, unspecified: Principal | ICD-10-CM

## 2023-10-02 LAB — COMPREHENSIVE METABOLIC PANEL
ALT: 48 U/L — ABNORMAL HIGH (ref 8–45)
AST: 81 U/L — ABNORMAL HIGH (ref 15–37)
Albumin/Globulin Ratio: 1 (ref 1.0–1.9)
Albumin: 3.2 g/dL (ref 3.2–4.6)
Alk Phosphatase: 77 U/L (ref 35–104)
Anion Gap: 11 mmol/L (ref 7–16)
BUN: 6 mg/dL — ABNORMAL LOW (ref 8–23)
CO2: 25 mmol/L (ref 20–29)
Calcium: 9.6 mg/dL (ref 8.8–10.2)
Chloride: 108 mmol/L — ABNORMAL HIGH (ref 98–107)
Creatinine: 0.57 mg/dL — ABNORMAL LOW (ref 0.60–1.10)
Est, Glom Filt Rate: >90 ml/min/1.73m2 (ref >60)
Globulin: 3.2 g/dL (ref 2.3–3.5)
Glucose: 105 mg/dL — ABNORMAL HIGH (ref 70–99)
Potassium: 3.9 mmol/L (ref 3.5–5.1)
Sodium: 145 mmol/L (ref 136–145)
Total Bilirubin: 1.1 mg/dL (ref 0.0–1.2)
Total Protein: 6.5 g/dL (ref 6.3–8.2)

## 2023-10-02 LAB — CBC WITH AUTO DIFFERENTIAL
Basophils %: 0.9 % (ref 0.0–2.0)
Basophils Absolute: 0.04 K/UL (ref 0.00–0.20)
Eosinophils %: 5.8 % (ref 0.5–7.8)
Eosinophils Absolute: 0.26 K/UL (ref 0.00–0.80)
Hematocrit: 41.6 % (ref 35.8–46.3)
Hemoglobin: 14.1 g/dL (ref 11.7–15.4)
Immature Granulocytes %: 0.2 % (ref 0.0–5.0)
Immature Granulocytes Absolute: 0.01 K/UL (ref 0.0–0.5)
Lymphocytes %: 40 % (ref 13.0–44.0)
Lymphocytes Absolute: 1.8 K/UL (ref 0.50–4.60)
MCH: 32.5 pg (ref 26.1–32.9)
MCHC: 33.9 g/dL (ref 31.4–35.0)
MCV: 95.9 FL (ref 82–102)
MPV: 9.9 FL (ref 9.4–12.3)
Monocytes %: 9.1 % (ref 4.0–12.0)
Monocytes Absolute: 0.41 K/UL (ref 0.10–1.30)
Neutrophils %: 44 % (ref 43.0–78.0)
Neutrophils Absolute: 1.98 K/UL (ref 1.70–8.20)
Platelets: 200 K/uL (ref 150–450)
RBC: 4.34 M/uL (ref 4.05–5.2)
RDW: 14 % (ref 11.9–14.6)
WBC: 4.5 K/uL (ref 4.3–11.1)
nRBC: 0 K/uL (ref 0.0–0.2)

## 2023-10-02 LAB — LIPID PANEL
Chol/HDL Ratio: 2.5 (ref 0.0–5.0)
Cholesterol, Total: 156 mg/dL (ref 0–200)
HDL: 62 mg/dL — ABNORMAL HIGH (ref 40–60)
LDL Cholesterol: 76 mg/dL (ref 0–100)
Triglycerides: 90 mg/dL (ref 0–150)
VLDL Cholesterol Calculated: 18 mg/dL (ref 6–23)

## 2023-10-02 LAB — AMB POC HEMOGLOBIN A1C: Hemoglobin A1C, POC: 5.5 %

## 2023-10-02 LAB — CK: Total CK: 61 U/L (ref 21–215)

## 2023-10-02 LAB — TSH REFLEX TO FT4: TSH w Free Thyroid if Abnormal: 2.68 u[IU]/mL (ref 0.27–4.20)

## 2023-10-02 MED ORDER — PRAVASTATIN SODIUM 20 MG PO TABS
20 | ORAL_TABLET | Freq: Every day | ORAL | 1 refills | 90.00000 days | Status: DC
Start: 2023-10-02 — End: 2023-12-05

## 2023-10-02 MED ORDER — SERTRALINE HCL 50 MG PO TABS
50 | ORAL_TABLET | Freq: Every day | ORAL | 1 refills | 90.00000 days | Status: DC
Start: 2023-10-02 — End: 2023-12-05

## 2023-10-02 MED ORDER — LEVOTHYROXINE SODIUM 75 MCG PO TABS
75 | ORAL_TABLET | Freq: Every day | ORAL | 1 refills | 90.00000 days | Status: DC
Start: 2023-10-02 — End: 2023-12-05

## 2023-10-02 MED ORDER — BACLOFEN 10 MG PO TABS
10 | ORAL_TABLET | Freq: Two times a day (BID) | ORAL | 0 refills | 30.00000 days | Status: DC
Start: 2023-10-02 — End: 2023-12-05

## 2023-10-02 MED ORDER — ALBUTEROL SULFATE HFA 108 (90 BASE) MCG/ACT IN AERS
108 | Freq: Four times a day (QID) | RESPIRATORY_TRACT | 3 refills | 25.00000 days | Status: DC | PRN
Start: 2023-10-02 — End: 2023-12-05

## 2023-10-02 MED ORDER — ALENDRONATE SODIUM 70 MG PO TABS
70 | ORAL_TABLET | ORAL | 1 refills | 84.00000 days | Status: DC
Start: 2023-10-02 — End: 2023-12-05

## 2023-10-02 NOTE — Progress Notes (Signed)
 [facility]  [address]  [place], [place] [address]   (ph) [phone] (fax) [phone]  Marly Schuld L. Cothran-Pate APRN, FNP-C      Chief Complaint   Patient presents with    Medicare AWV       68 yo female comes in for her 3 month follow up and AWV. She is still seeing Zachary since last here and feels he is helping her. She is still having back pain occasionally and is under a new pain management. Reports stress level has increased and she feels she may need to see psychiatry. Zoloft  helps but may need additional med, possible genesight testing. Reports occasional incontinence with sneezing, coughing, position. Feels it may be related to the kidney mass. She has a f/u with Dr. Theodoro next month for a recheck.       Allergies   Allergen Reactions    Atorvastatin Other (See Comments)     Muscle pain    Hydrocodone-Acetaminophen  Itching    Penicillins Hives       Past Medical History:   Diagnosis Date    Arthritis     Chronic back pain     Colon polyp 2015    Had 5 polyps negative for cancer    Hypertension     Hyperthyroidism     Kidney stone     Only happened  once    Liver disease     Thyroid disease     hypothyroid       Family History   Problem Relation Age of Onset    Asthma Mother     Lung Disease Mother     High Blood Pressure Mother     Emphysema Mother     Heart Disease Mother     Hypertension Mother     Liver Disease Father     Alcohol Abuse Father     Early Death Sister     Stroke Brother     Diabetes Brother     Alcohol Abuse Brother     Early Death Brother     Heart Attack Brother     High Blood Pressure Brother     Substance Abuse Brother     Alcohol Abuse Brother     Diabetes Brother     High Blood Pressure Brother     Substance Abuse Brother     Breast Cancer Neg Hx        Social History     Socioeconomic History    Marital status: Divorced     Spouse name: Not on file    Number of children: Not on file    Years of education: Not on file    Highest education  level: Not on file   Occupational History    Not on file   Tobacco Use    Smoking status: Former     Current packs/day: 0.00     Average packs/day: 1 pack/day for 17.7 years (17.7 ttl pk-yrs)     Types: Cigarettes     Start date: 02/26/1979     Quit date: 10/25/1996     Years since quitting: 26.9     Passive exposure: Never    Smokeless tobacco: Former   Substance and Sexual Activity    Alcohol use: No    Drug use: No    Sexual activity: Not Currently     Partners: Male   Other Topics Concern    Not on file  Social History Narrative    Not on file     Social Drivers of Health     Financial Resource Strain: Low Risk  (06/20/2022)    Overall Financial Resource Strain (CARDIA)     Difficulty of Paying Living Expenses: Not hard at all   Food Insecurity: No Food Insecurity (03/29/2023)    Hunger Vital Sign     Worried About Running Out of Food in the Last Year: Never true     Ran Out of Food in the Last Year: Never true   Transportation Needs: No Transportation Needs (03/29/2023)    PRAPARE - Therapist, art (Medical): No     Lack of Transportation (Non-Medical): No   Physical Activity: Insufficiently Active (10/02/2023)    Exercise Vital Sign     Days of Exercise per Week: 2 days     Minutes of Exercise per Session: 60 min   Stress: Not on file   Social Connections: Not on file   Intimate Partner Violence: Not on file   Housing Stability: Low Risk  (03/29/2023)    Housing Stability Vital Sign     Unable to Pay for Housing in the Last Year: No     Number of Times Moved in the Last Year: 0     Homeless in the Last Year: No       OB History    No obstetric history on file.         Past Surgical History:   Procedure Laterality Date    APPENDECTOMY      CERVICAL DISCECTOMY  09/16/06    Anterior C5-C6 diskectomy with decompression;  anterior cervical arthrodesis interbody technique;  anterior cervical instrumentation;  tricortical iliac crest bone graft harvested through a separate incision.     CHOLECYSTECTOMY       DIAGNOSTIC CARDIAC CATH LAB PROCEDURE      HYSTERECTOMY, VAGINAL  1988    TAH AND BSO (CERVIX REMOVED)      TUBAL LIGATION         Health Maintenance   Topic Date Due    Respiratory Syncytial Virus (RSV) Pregnant or age 67 yrs+ (1 - Risk 60-74 years 1-dose series) Never done    COVID-19 Vaccine (5 - 2024-25 season) 10/27/2022    Annual Wellness Visit (Medicare Advantage)  02/26/2023    Flu vaccine (1) 09/26/2023    Colorectal Cancer Screen  11/27/2023    Lipids  03/31/2024    Depression Monitoring  10/01/2024    Breast cancer screen  01/03/2025    Diabetes screen  12/30/2025    DTaP/Tdap/Td vaccine (3 - Td or Tdap) 10/02/2032    DEXA (modify frequency per FRAX score)  Completed    Shingles vaccine  Completed    Pneumococcal 50+ years Vaccine  Completed    Hepatitis C screen  Completed    Hepatitis A vaccine  Aged Out    Hepatitis B vaccine  Aged Out    Hib vaccine  Aged Out    Polio vaccine  Aged Out    Meningococcal (ACWY) vaccine  Aged Out    Meningococcal B vaccine  Aged Out    Depression Screen  Discontinued    Pneumococcal 0-49 years Vaccine  Discontinued    Cervical cancer screen  Discontinued         Current Outpatient Medications:     sertraline  (ZOLOFT ) 50 MG tablet, Take 1 tablet by mouth daily, Disp: 90 tablet, Rfl: 1  pravastatin  (PRAVACHOL ) 20 MG tablet, Take 1 tablet by mouth daily, Disp: 90 tablet, Rfl: 1    levothyroxine  (SYNTHROID ) 75 MCG tablet, Take 1 tablet by mouth every morning (before breakfast), Disp: 90 tablet, Rfl: 1    alendronate  (FOSAMAX ) 70 MG tablet, Take 1 tablet by mouth every 7 days, Disp: 12 tablet, Rfl: 1    albuterol  sulfate HFA (PROVENTIL ;VENTOLIN ;PROAIR ) 108 (90 Base) MCG/ACT inhaler, Inhale 2 puffs into the lungs every 6 hours as needed for Wheezing, Disp: 18 g, Rfl: 3    baclofen  (LIORESAL ) 10 MG tablet, Take 1 tablet by mouth 2 times daily, Disp: 20 tablet, Rfl: 0    predniSONE (DELTASONE) 5 MG tablet, Take 1 tablet by mouth daily (Patient not taking: Reported on  10/02/2023), Disp: , Rfl:     aspirin 81 MG chewable tablet, Take by mouth daily (Patient not taking: Reported on 10/02/2023), Disp: , Rfl:     Review of Systems   Constitutional: Negative.    HENT: Negative.     Eyes: Negative.    Respiratory: Negative.     Cardiovascular: Negative.    Gastrointestinal: Negative.    Endocrine: Negative.    Genitourinary:         Incontinence at times   Musculoskeletal: Negative.    Skin: Negative.    Allergic/Immunologic: Negative.    Neurological: Negative.    Hematological: Negative.    Psychiatric/Behavioral: Negative.          Vitals:    10/02/23 0807   BP: 126/80   Pulse: 78   SpO2: 98%        Physical Exam  Constitutional:       Appearance: Normal appearance.   HENT:      Head: Normocephalic.      Nose: Nose normal.   Eyes:      Extraocular Movements: Extraocular movements intact.      Pupils: Pupils are equal, round, and reactive to light.   Cardiovascular:      Rate and Rhythm: Normal rate and regular rhythm.   Pulmonary:      Effort: Pulmonary effort is normal.      Breath sounds: Normal breath sounds.   Abdominal:      General: Abdomen is flat.      Palpations: Abdomen is soft.   Musculoskeletal:         General: Normal range of motion.      Cervical back: Normal range of motion and neck supple.   Skin:     General: Skin is warm and dry.   Neurological:      General: No focal deficit present.      Mental Status: She is alert and oriented to person, place, and time.   Psychiatric:         Mood and Affect: Mood normal.         Behavior: Behavior normal.                10/02/2023     6:24 AM 07/14/2023     4:19 PM 07/02/2023     8:09 AM 06/13/2023    11:11 AM 05/27/2023     4:29 PM 05/27/2023     4:28 PM 05/13/2023     3:58 PM   PHQ Scores   PHQ2 Score 2  2  2 5   0 0 0   PHQ9 Score 2  4  4 12  2  0 2       Patient-reported  Assessment & Plan:    1. Depression, unspecified depression type  PHQ-9=2  - sertraline  (ZOLOFT ) 50 MG tablet; Take 1 tablet by mouth daily  Dispense: 90 tablet;  Refill: 1  - [facility] - W[facility] Darice LABOR, NP, Psychiatry, [place]    2. Mixed hyperlipidemia  stable  - pravastatin  (PRAVACHOL ) 20 MG tablet; Take 1 tablet by mouth daily  Dispense: 90 tablet; Refill: 1  - Comprehensive Metabolic Panel; Future  - Lipid Panel; Future  - CK; Future    3. Hypothyroidism, unspecified type  stable  - levothyroxine  (SYNTHROID ) 75 MCG tablet; Take 1 tablet by mouth every morning (before breakfast)  Dispense: 90 tablet; Refill: 1  - TSH reflex to FT4; Future    4. Osteoporosis, unspecified osteoporosis type, unspecified pathological fracture presence  - alendronate  (FOSAMAX ) 70 MG tablet; Take 1 tablet by mouth every 7 days  Dispense: 12 tablet; Refill: 1    5. Chronic bilateral low back pain without sciatica  Under pain management    6. Colon cancer screening  - [facility] [place] [facility], [place]    7. Other fatigue  - CBC with Auto Differential; Future    8. Elevated glucose  - AMB POC HEMOGLOBIN A1C    9. Left kidney mass  Under Dr. Theodoro; will discuss incontinence with him next month; if she chooses Pelvic floor exercises will let me know and I can refer to PT    10. Back spasm  Changed from Flexeril  to Baclofen   - baclofen  (LIORESAL ) 10 MG tablet; Take 1 tablet by mouth 2 times daily  Dispense: 20 tablet; Refill: 0    11. Encounter for subsequent annual wellness visit (AWV) in Medicare patient    Greater than 50% counseling and/or coordination of care: the treatment regimen is extensive; detailed review. Will notify of labs. Recheck in 3 months. This note was dictated using dragon voice recognition software.  It has been proofread, but there may still exist voice recognition errors that the author did not detect.      Signed By: Grayce LITTIE Conine, APRN - CNP     October 02, 2023    1. Medicare annual wellness visit, subsequent     2. Screening for Depression  PHQ2=2     3. Screening for alcoholism  Pt denies drinking     4. Screening  immunizations  Immunizations are not UTD.      5. Screening for Hep C  05/15/2021- negative     6. Screening for colon cancer  Colonoscopy 2020, due again 2025  Referral placed     7. Screening for breast cancer  Mammogram later this year     8. Screening for cervical cancer  Hysterectomy years ago; not due to cancer per pt.     9. Postmenopausal  Dexa scan UTD     10. Routine eye exam  Pt scheduling appt.         Reviewed health care maintenance. All appropriate screenings ordered or discussed/offered that are recommended or due. Information provided on Advanced Directive,Living Will and POA with AVS. Pt to return for next Medicare Wellness in Annual Wellness Visit    Kasandra Fehr is here for Medicare AWV    Assessment & Plan   Depression, unspecified depression type  -     sertraline  (ZOLOFT ) 50 MG tablet; Take 1 tablet by mouth daily, Disp-90 tablet, R-1Normal  -     [facility] - W[facility] Darice LABOR, NP, Psychiatry,  [place]  Mixed hyperlipidemia  -     pravastatin  (PRAVACHOL ) 20 MG tablet; Take 1 tablet by mouth daily, Disp-90 tablet, R-1Normal  -     Comprehensive Metabolic Panel; Future  -     Lipid Panel; Future  -     CK; Future  Hypothyroidism, unspecified type  -     levothyroxine  (SYNTHROID ) 75 MCG tablet; Take 1 tablet by mouth every morning (before breakfast), Disp-90 tablet, R-1Normal  -     TSH reflex to FT4; Future  Osteoporosis, unspecified osteoporosis type, unspecified pathological fracture presence  -     alendronate  (FOSAMAX ) 70 MG tablet; Take 1 tablet by mouth every 7 days, Disp-12 tablet, R-1Normal  Chronic bilateral low back pain without sciatica  Colon cancer screening  -     [facility] - [place] - [address] [facility], [place]  Other fatigue  -     CBC with Auto Differential; Future  Elevated glucose  -     AMB POC HEMOGLOBIN A1C  Left kidney mass  Back spasm  -     baclofen  (LIORESAL ) 10 MG tablet; Take 1 tablet by mouth 2 times daily, Disp-20 tablet, R-0Normal  Encounter  for subsequent annual wellness visit (AWV) in Medicare patient       No follow-ups on file.     Subjective       Patient's complete Health Risk Assessment and screening values have been reviewed and are found in Flowsheets. The following problems were reviewed today and where indicated follow up appointments were made and/or referrals ordered.    Positive Risk Factor Screenings with Interventions:    Fall Risk:  Do you feel unsteady or are you worried about falling? : (!) yes  2 or more falls in past year?: (!) yes  Fall with injury in past year?: (!) yes  Interventions:    Not worried about falling            General HRA Questions:  Select all that apply: (!) Stress  Interventions - Stress:  Patient comments: wants referred to psychiatry; currently under Zachary for counseling.       Inactivity:  On average, how many days per week do you engage in moderate to strenuous exercise (like a brisk walk)?: 2 days (!) Abnormal  On average, how many minutes do you engage in exercise at this level?: 60 min  Interventions:  Patient declined any further interventions or treatment     Abnormal BMI (obese):  Body mass index is 36.13 kg/m. (!) Abnormal  Interventions:  Patient declines any further evaluation or treatment        Dentist Screen:  Have you seen the dentist within the past year?: (!) No    Intervention:  Patient comments: will schedule     Vision Screen:  Do you have difficulty driving, watching TV, or doing any of your daily activities because of your eyesight?: No  Have you had an eye exam within the past year?: (!) No  Interventions:   Patient declines any further evaluation or treatment       Advanced Directives:  Do you have a Living Will?: (!) No    Intervention:  has NO advanced directive - information provided         Clock drawing  Word recall x 3  Wears a seat belt  Has a smoke detector            Objective   Vitals:    10/02/23 0807  BP: 126/80   Pulse: 78   SpO2: 98%   Weight: 83.9 kg (185 lb)   Height:  1.524 m (5')      Body mass index is 36.13 kg/m.                  Allergies   Allergen Reactions    Atorvastatin Other (See Comments)     Muscle pain    Hydrocodone-Acetaminophen  Itching    Penicillins Hives     Prior to Visit Medications    Medication Sig Taking? Authorizing Provider   sertraline  (ZOLOFT ) 50 MG tablet Take 1 tablet by mouth daily Yes Cothran-Pate, Emani Taussig L, APRN - CNP   pravastatin  (PRAVACHOL ) 20 MG tablet Take 1 tablet by mouth daily Yes Cothran-Pate, Chima Astorino L, APRN - CNP   levothyroxine  (SYNTHROID ) 75 MCG tablet Take 1 tablet by mouth every morning (before breakfast) Yes Cothran-Pate, Saheed Carrington L, APRN - CNP   alendronate  (FOSAMAX ) 70 MG tablet Take 1 tablet by mouth every 7 days Yes Cothran-Pate, Osamu Olguin L, APRN - CNP   albuterol  sulfate HFA (PROVENTIL ;VENTOLIN ;PROAIR ) 108 (90 Base) MCG/ACT inhaler Inhale 2 puffs into the lungs every 6 hours as needed for Wheezing Yes Cothran-Pate, Mukund Weinreb L, APRN - CNP   baclofen  (LIORESAL ) 10 MG tablet Take 1 tablet by mouth 2 times daily Yes Cothran-Pate, Josemiguel Gries L, APRN - CNP   predniSONE (DELTASONE) 5 MG tablet Take 1 tablet by mouth daily  Patient not taking: Reported on 10/02/2023  [provider]   aspirin 81 MG chewable tablet Take by mouth daily  Patient not taking: Reported on 10/02/2023  Automatic Reconciliation, Ar       CareTeam (Including outside providers/suppliers regularly involved in providing care):   Patient Care Team:  Cothran-Pate, Grayce CROME, APRN - CNP as PCP - General  Cothran-Pate, Grayce CROME, APRN - CNP as PCP - Empaneled Provider     Recommendations for Preventive Services Due: see orders and patient instructions/AVS.  Recommended screening schedule for the next 5-10 years is provided to the patient in written form: see Patient Instructions/AVS.     Reviewed and updated this visit:  Tobacco  Allergies  Meds

## 2023-10-02 NOTE — Telephone Encounter (Signed)
 Patient LVM on referral line 9:22 am asking for call back to schedule colo.     Forwarding to surgery scheduler.

## 2023-10-02 NOTE — Other (Signed)
 Results to patient please.  Total cholesterol is 156, triglycerides 90, HDL 62 and LDL 76.  Sodium and potassium are normal.  Glucose was 105.  Liver enzymes are elevated.  She has seen GI for elevated liver enzymes.  Hemoglobin is 14.1.  TSH is within normal limits.  Thanks

## 2023-10-13 ENCOUNTER — Inpatient Hospital Stay: Admit: 2023-10-13 | Payer: Medicare (Managed Care) | Attending: Urology

## 2023-10-13 DIAGNOSIS — N2889 Other specified disorders of kidney and ureter: Principal | ICD-10-CM

## 2023-11-21 ENCOUNTER — Encounter

## 2023-11-21 ENCOUNTER — Encounter: Payer: Medicare (Managed Care) | Attending: Urology

## 2023-11-25 ENCOUNTER — Inpatient Hospital Stay
Admission: EM | Admit: 2023-11-25 | Discharge: 2023-11-27 | Disposition: A | Discharge status: Still In Hospital | Payer: Medicare (Managed Care)

## 2023-11-25 ENCOUNTER — Emergency Department: Admit: 2023-11-25 | Payer: Medicare (Managed Care)

## 2023-11-25 DIAGNOSIS — J9601 Acute respiratory failure with hypoxia: Secondary | ICD-10-CM

## 2023-11-25 DIAGNOSIS — A419 Sepsis, unspecified organism: Secondary | ICD-10-CM

## 2023-11-25 LAB — ARTERIAL BLOOD GAS, POC
Base Deficit (POC): 0.3 mmol/L
Base Excess: 0 mmol/L
FIO2: 60 %
POC Allen's Test: POSITIVE
POC Allen's Test: POSITIVE
POC HCO3: 21.4 MMOL/L (ref 21–28)
POC HCO3: 21.8 MMOL/L (ref 21–28)
POC O2 SAT: 90.6 % — ABNORMAL LOW (ref 94–98)
POC O2 SAT: 87.7 % — ABNORMAL LOW (ref 94–98)
POC PO2: 52 mmHg — ABNORMAL LOW (ref 75–100)
POC PO2: 51 mmHg — ABNORMAL LOW (ref 75–100)
POC pCO2: 26.1 mmHg — ABNORMAL LOW (ref 35–45)
POC pCO2: 29 mmHg — ABNORMAL LOW (ref 35–45)
POC pH: 7.52 — ABNORMAL HIGH (ref 7.35–7.45)
POC pH: 7.49 — ABNORMAL HIGH (ref 7.35–7.45)
Pt Temp: 100

## 2023-11-25 LAB — URINALYSIS
BACTERIA, URINE: NEGATIVE /HPF
Blood, Urine: NEGATIVE
Glucose, Ur: NEGATIVE mg/dL
Ketones, Urine: 15 mg/dL — AB
Leukocyte Esterase, Urine: NEGATIVE
Nitrite, Urine: NEGATIVE
Specific Gravity, UA: 1.015 (ref 1.001–1.023)
Urobilinogen, Urine: >8 EU/dL — ABNORMAL HIGH (ref 0.2–1.0)
pH, Urine: 6 (ref 5.0–9.0)

## 2023-11-25 LAB — PROCALCITONIN: Procalcitonin: 1.9 ng/mL — ABNORMAL HIGH (ref 0.00–0.10)

## 2023-11-25 LAB — COMPREHENSIVE METABOLIC PANEL
ALT: 35 U/L (ref 8–45)
AST: 67 U/L — ABNORMAL HIGH (ref 15–37)
Albumin/Globulin Ratio: 0.7 — ABNORMAL LOW (ref 1.0–1.9)
Albumin: 2.5 g/dL — ABNORMAL LOW (ref 3.2–4.6)
Alk Phosphatase: 101 U/L (ref 35–104)
Anion Gap: 15 mmol/L (ref 7–16)
BUN: 12 mg/dL (ref 8–23)
CO2: 22 mmol/L (ref 20–29)
Calcium: 8.6 mg/dL — ABNORMAL LOW (ref 8.8–10.2)
Chloride: 95 mmol/L — ABNORMAL LOW (ref 98–107)
Creatinine: 0.74 mg/dL (ref 0.60–1.10)
Est, Glom Filt Rate: 89 ml/min/1.73m2 (ref >60)
Globulin: 3.4 g/dL (ref 2.3–3.5)
Glucose: 122 mg/dL — ABNORMAL HIGH (ref 70–99)
Potassium: 2.9 mmol/L — ABNORMAL LOW (ref 3.5–5.1)
Sodium: 132 mmol/L — ABNORMAL LOW (ref 136–145)
Total Bilirubin: 3.4 mg/dL — ABNORMAL HIGH (ref 0.0–1.2)
Total Protein: 5.9 g/dL — ABNORMAL LOW (ref 6.3–8.2)

## 2023-11-25 LAB — CBC WITH AUTO DIFFERENTIAL
Hematocrit: 41.6 % (ref 35.8–46.3)
Hemoglobin: 14.2 g/dL (ref 11.7–15.4)
Lymphocytes Absolute: 1.96 K/UL (ref 0.5–4.6)
Lymphocytes: 17 % (ref 16–44)
MCH: 31.5 pg (ref 26.1–32.9)
MCHC: 34.1 g/dL (ref 31.4–35.0)
MCV: 92.2 FL (ref 82.0–102.0)
MPV: 9.9 FL (ref 9.4–12.3)
Metamyelocytes: 5 %
Monocytes %: 5 % (ref 3–9)
Monocytes Absolute: 0.58 K/UL (ref 0.1–1.3)
Myelocytes: 10 %
Neutrophils %: 61 % (ref 47–75)
Neutrophils Absolute: 8.96 K/UL — ABNORMAL HIGH (ref 1.7–8.2)
Platelet Comment: ADEQUATE
Platelets: 156 K/uL (ref 150–450)
Promyelocytes: 2 %
RBC: 4.51 M/uL (ref 4.05–5.2)
RDW: 13.5 % (ref 11.9–14.6)
WBC: 11.5 K/uL — ABNORMAL HIGH (ref 4.3–11.1)
nRBC: 0.06 K/uL (ref 0.0–0.2)

## 2023-11-25 LAB — COVID-19 & INFLUENZA COMBO
Influenza A, NAA: NOT DETECTED
Influenza B, NAA: NOT DETECTED
SARS-CoV-2, Rapid: NOT DETECTED

## 2023-11-25 LAB — EKG 12-LEAD
Atrial Rate: 113 {beats}/min
P Axis: 67 degrees
P-R Interval: 133 ms
Q-T Interval: 481 ms
QRS Duration: 103 ms
QTc Calculation (Bazett): 663 ms
R Axis: 49 degrees
T Axis: 59 degrees
Ventricular Rate: 114 {beats}/min

## 2023-11-25 LAB — MAGNESIUM: Magnesium: 1.9 mg/dL (ref 1.8–2.4)

## 2023-11-25 LAB — LACTATE, SEPSIS
Lactic Acid, Sepsis: 3.4 MMOL/L — ABNORMAL HIGH (ref 0.5–2.0)
Lactic Acid, Sepsis: 2.2 MMOL/L — ABNORMAL HIGH (ref 0.5–2.0)

## 2023-11-25 LAB — TROPONIN: Troponin T: <6 ng/L (ref 0–14)

## 2023-11-25 LAB — BRAIN NATRIURETIC PEPTIDE: NT Pro-BNP: 145 pg/mL — ABNORMAL HIGH (ref 0–125)

## 2023-11-25 MED ORDER — FUROSEMIDE 10 MG/ML IJ SOLN
10 | Freq: Once | INTRAMUSCULAR | Status: AC
Start: 2023-11-25 — End: 2023-11-25
  Administered 2023-11-25: 21:00:00 40 mg via INTRAVENOUS

## 2023-11-25 MED ORDER — MAGNESIUM SULFATE 2000 MG/50 ML IVPB PREMIX
2 | INTRAVENOUS | Status: DC | PRN
Start: 2023-11-25 — End: 2023-11-27

## 2023-11-25 MED ORDER — LEVOTHYROXINE SODIUM 75 MCG PO TABS
75 | Freq: Every day | ORAL | Status: DC
Start: 2023-11-25 — End: 2023-11-27
  Administered 2023-11-26 – 2023-11-27 (×2): 75 ug via ORAL

## 2023-11-25 MED ORDER — CEFTRIAXONE SODIUM 1 G IJ SOLR
1 | INTRAMUSCULAR | Status: DC
Start: 2023-11-25 — End: 2023-11-27
  Administered 2023-11-26: 14:00:00 1000 mg via INTRAVENOUS

## 2023-11-25 MED ORDER — DOXYCYCLINE HYCLATE 100 MG PO CAPS
100 | Freq: Two times a day (BID) | ORAL | Status: DC
Start: 2023-11-25 — End: 2023-11-25

## 2023-11-25 MED ORDER — DOXYCYCLINE HYCLATE 100 MG IV SOLR
100 | Freq: Two times a day (BID) | INTRAVENOUS | Status: DC
Start: 2023-11-25 — End: 2023-11-27
  Administered 2023-11-26 – 2023-11-27 (×3): 100 mg via INTRAVENOUS

## 2023-11-25 MED ORDER — DOXYCYCLINE HYCLATE 100 MG IV SOLR
100 | INTRAVENOUS | Status: AC
Start: 2023-11-25 — End: 2023-11-25
  Administered 2023-11-25: 18:00:00 100 mg via INTRAVENOUS

## 2023-11-25 MED ORDER — SODIUM CHLORIDE 0.9 % IV SOLN
0.9 | INTRAVENOUS | Status: DC | PRN
Start: 2023-11-25 — End: 2023-11-27
  Administered 2023-11-26: 08:00:00 via INTRAVENOUS

## 2023-11-25 MED ORDER — IPRATROPIUM-ALBUTEROL 0.5-2.5 (3) MG/3ML IN SOLN
0.5-2.5 | RESPIRATORY_TRACT | Status: DC | PRN
Start: 2023-11-25 — End: 2023-11-27
  Administered 2023-11-26: 02:00:00 1 via RESPIRATORY_TRACT

## 2023-11-25 MED ORDER — SERTRALINE HCL 50 MG PO TABS
50 | Freq: Every day | ORAL | Status: DC
Start: 2023-11-25 — End: 2023-11-25

## 2023-11-25 MED ORDER — ONDANSETRON 4 MG PO TBDP
4 | Freq: Three times a day (TID) | ORAL | Status: DC | PRN
Start: 2023-11-25 — End: 2023-11-27

## 2023-11-25 MED ORDER — NORMAL SALINE FLUSH 0.9 % IV SOLN
0.9 | Freq: Two times a day (BID) | INTRAVENOUS | Status: DC
Start: 2023-11-25 — End: 2023-11-27
  Administered 2023-11-26 – 2023-11-27 (×4): 10 mL via INTRAVENOUS

## 2023-11-25 MED ORDER — POTASSIUM CHLORIDE CRYS ER 20 MEQ PO TBCR
20 | ORAL | Status: DC | PRN
Start: 2023-11-25 — End: 2023-11-27

## 2023-11-25 MED ORDER — POTASSIUM CHLORIDE 10 MEQ/100ML IV SOLN
10 | INTRAVENOUS | Status: AC
Start: 2023-11-25 — End: 2023-11-25

## 2023-11-25 MED ORDER — CEFTRIAXONE SODIUM 2 G IJ SOLR
2 | INTRAMUSCULAR | Status: AC
Start: 2023-11-25 — End: 2023-11-25
  Administered 2023-11-25: 15:00:00 2000 mg via INTRAVENOUS

## 2023-11-25 MED ORDER — SODIUM CHLORIDE (PF) 0.9 % IJ SOLN
0.9 | Freq: Every day | INTRAMUSCULAR | Status: DC
Start: 2023-11-25 — End: 2023-11-27
  Administered 2023-11-26 – 2023-11-27 (×2): 40 mg via INTRAVENOUS

## 2023-11-25 MED ORDER — ONDANSETRON HCL 4 MG/2ML IJ SOLN
4 | Freq: Four times a day (QID) | INTRAMUSCULAR | Status: DC | PRN
Start: 2023-11-25 — End: 2023-11-27

## 2023-11-25 MED ORDER — POTASSIUM CHLORIDE 10 MEQ/100ML IV SOLN
10 | INTRAVENOUS | Status: DC | PRN
Start: 2023-11-25 — End: 2023-11-27
  Administered 2023-11-25 – 2023-11-26 (×7): 10 meq via INTRAVENOUS

## 2023-11-25 MED ORDER — NORMAL SALINE FLUSH 0.9 % IV SOLN
0.9 | INTRAVENOUS | Status: DC | PRN
Start: 2023-11-25 — End: 2023-11-27

## 2023-11-25 MED ORDER — SODIUM CHLORIDE 0.9 % IV BOLUS
0.9 | INTRAVENOUS | Status: AC
Start: 2023-11-25 — End: 2023-11-25
  Administered 2023-11-25: 17:00:00 2000 mL via INTRAVENOUS

## 2023-11-25 MED ORDER — POLYETHYLENE GLYCOL 3350 17 G PO PACK
17 | Freq: Every day | ORAL | Status: DC | PRN
Start: 2023-11-25 — End: 2023-11-27

## 2023-11-25 MED ORDER — POTASSIUM BICARB-CITRIC ACID 20 MEQ PO TBEF
20 | ORAL | Status: DC | PRN
Start: 2023-11-25 — End: 2023-11-27
  Administered 2023-11-26: 23:00:00 40 meq via ORAL

## 2023-11-25 MED ORDER — ENOXAPARIN SODIUM 40 MG/0.4ML IJ SOSY
40 | Freq: Every day | INTRAMUSCULAR | Status: DC
Start: 2023-11-25 — End: 2023-11-25

## 2023-11-25 MED ORDER — PRAVASTATIN SODIUM 20 MG PO TABS
20 | Freq: Every day | ORAL | Status: DC
Start: 2023-11-25 — End: 2023-11-25

## 2023-11-25 MED FILL — DOXY 100 100 MG IV SOLR: 100 mg | INTRAVENOUS | Qty: 100 | Fill #0

## 2023-11-25 MED FILL — CEFTRIAXONE SODIUM 2 G IJ SOLR: 2 g | INTRAMUSCULAR | Qty: 2000 | Fill #0

## 2023-11-25 MED FILL — FUROSEMIDE 10 MG/ML IJ SOLN: 10 mg/mL | INTRAMUSCULAR | Qty: 4 | Fill #0

## 2023-11-25 MED FILL — POTASSIUM CHLORIDE 10 MEQ/100ML IV SOLN: 10 MEQ/0ML | INTRAVENOUS | Qty: 100 | Fill #0

## 2023-11-25 NOTE — ACP (Advance Care Planning) (Signed)
 Advance Care Planning Note   Admit Date:  11/25/2023  9:54 AM   Name:  Sandra Harmon   Age:  68 y.o.  Sex:  female  DOB:  March 30, 1955   MRN:  [id number]   Room:  ER03/03    [name] has capacity to make her own decisions:   Yes    If pt unable to make decisions, POA/surrogate decision maker:  Son, Adina    Other people present:   Son and Nursing    Patient / surrogate decision-maker directed code status:  Full Code    Other ACP topics discussed, if applicable:   None    Patient or surrogate consented to discussion of the current conditions, workup, management plans, prognosis, and the risk for further deterioration.  Time spent: 16 minutes in direct discussion.      Signed:  Mitzie Curtistine Pass, DO

## 2023-11-25 NOTE — Progress Notes (Shared)
 4 Eyes Skin Assessment     NAME:  Sandra Harmon  DATE OF BIRTH:  03/08/1955  MEDICAL RECORD NUMBER:  [id number]    The patient is being assessed for  Admission    I agree that at least one RN has performed a thorough Head to Toe Skin Assessment on the patient. ALL assessment sites listed below have been assessed.      Areas assessed by both nurses:    Head, Face, Ears, Shoulders, Back, Chest, Arms, Elbows, Hands, Sacrum. Buttock, Coccyx, Ischium, Legs. Feet and Heels, and Under Medical Devices         Does the Patient have a Wound? No noted wound(s)       Braden Prevention initiated by RN: No  Wound Care Orders initiated by RN: No    For hospital-acquired stage 1 & 2 and ALL Stage 3,4, Unstageable, DTI, NWPT, and Complex wounds: place order "IP Wound Care/Ostomy Nurse Eval and Treat" by RN under ORDER ENTRY: NA    New Ostomies, if present place, Ostomy referral order under ORDER ENTRY: No     Nurse 1 eSignature: Electronically signed by Marquita LOISE Lown, RN on 11/26/23 at 5:07 AM EDT    **SHARE this note so that the co-signing nurse can place an eSignature**    Nurse 2 eSignature: {Esignature:[id number]}

## 2023-11-25 NOTE — ACP (Advance Care Planning) (Signed)
 Advance Care Planning     Advance Care Planning Activator (Inpatient)  Conversation Note      Date of ACP Conversation: 11/25/2023     Conversation Conducted with: Patient with Decision Making Capacity and Legal next of kin    ACP Activator: Saya Mccoll, Ellouise CROME, MSW        Health Care Decision Maker:     Current Designated Health Care Decision Maker:     Primary Decision Maker: Sandra Harmon,Sandra Harmon - Child [phone]    Secondary Decision Maker: Sandra Harmon - Daughter-in-Law - [phone]  Click here to complete Healthcare Decision Makers including section of the Healthcare Decision Maker Relationship (ie Primary)  Today we documented Decision Maker(s) consistent with Legal Next of Kin hierarchy.

## 2023-11-25 NOTE — Plan of Care (Signed)
 Problem: Discharge Planning  Goal: Discharge to home or other facility with appropriate resources  Outcome: Progressing  Flowsheets  Taken 11/25/2023 2100  Discharge to home or other facility with appropriate resources:   Identify barriers to discharge with patient and caregiver   Arrange for needed discharge resources and transportation as appropriate   Identify discharge learning needs (meds, wound care, etc)   Arrange for interpreters to assist at discharge as needed   Refer to discharge planning if patient needs post-hospital services based on physician order or complex needs related to functional status, cognitive ability or social support system  Taken 11/25/2023 1440  Discharge to home or other facility with appropriate resources:   Identify barriers to discharge with patient and caregiver   Arrange for needed discharge resources and transportation as appropriate   Identify discharge learning needs (meds, wound care, etc)   Arrange for interpreters to assist at discharge as needed   Refer to discharge planning if patient needs post-hospital services based on physician order or complex needs related to functional status, cognitive ability or social support system     Problem: Pain  Goal: Verbalizes/displays adequate comfort level or baseline comfort level  Outcome: Progressing     Problem: Safety - Adult  Goal: Free from fall injury  Outcome: Progressing     Problem: ABCDS Injury Assessment  Goal: Absence of physical injury  Outcome: Progressing

## 2023-11-25 NOTE — ED Triage Notes (Signed)
 Patient to triage with c/o chest pain/ SOB that started yesterday. Pt son also states she has been very lethargic the past few days and not getting around like normal

## 2023-11-25 NOTE — ED Provider Notes (Incomplete)
 Emergency Department Provider Note                   PCP:                Cothran-Pate, Grayce CROME, APRN - CNP               Age: 68 y.o.      Sex: female   Final diagnosis/impression:  1. Acute respiratory failure with hypoxia (HCC)    2. Pneumonia of both lungs due to infectious organism, unspecified part of lung    3. Hypokalemia       Disposition: Admit to Hospitalist    MDM/Clinical Course:  Patient seen by myself at the [facility] emergency department. Patient had signs symptoms and clinical history most consistent with chest pain, dyspnea type symptoms, broad differential, overall clinically guarded appearance. My independent analysis/interpretation of laboratory work-up here shows [] . Radiology shows [] . While under my care, patient received [] .  Placed on Airvo for respiratory support given tachypnea, increased work of breathing on 6 L nasal cannula.  In considertion of patient clinical presentation, laboratory/radiologic findings, diagnoses/conditions previously discussed and clinical course as previously discussed, I did feel it appropriate to admit the patient for further evaluation, observation and management.  I communicated with the hospitalist in detail about the patient and they agreed to see and admit the patient.  Patient/family verbalized understanding and agreement with this course of action and plan.  I appreciate the assistance and expertise of the hospitalist team in further diagnostic care and management of the patient.    Complexity of Problems Addressed:  1 or more acute illnesses that pose a threat to life or bodily function.     Data Reviewed and Analyzed:    Category 1:   I reviewed external records: Reviewed echocardiogram from 01/10/2021 showing normal EF, mild increased wall thickness, mild concentric hypertrophy, normal diastolic function  I ordered each unique test.  I reviewed the results of each unique test.    Category 2:   My Independent EKG Interpretation:   Sinus  tachycardia, rate of 114, no ST elevation MI, repolarization changes present inferiorly, laterally, QTc is 663 and is prolonged, QRS is 103 and is not widened    Category 3: Discussion of management or test interpretation.  See MDM / clinical course section above for details    Risk of Complications and/or Morbidity of Patient Management:  Prescription drug management performed.  Drug therapy given requiring intensive monitoring for toxicity.  Chronic medical problems impacting care include ***.  Shared medical decision making was utilized in creating the patients health plan today.  Considerations: The following items were considered but not ordered: ***.  The patient was admitted and I have discussed patient management with the admitting provider.  _____    Quality/Sepsis:    SEP-1 CORE MEASURE    Fluid Resuscitation Rational: at least 51mL/kg based on entered actual weight at time of evaluation    Repeat Lactate Level: {lactic #2:19197::not indicated due to initial lactate < 2,improving,worsening,ordered and pending at this time}    Reassessment Exam: Not applicable. Patient does not have septic shock.    Critical Care Procedure Note: 35 minutes of critical care time was performed in the emergency department.  This time was separate from any other procedures listed during the patients emergency department course. The failure to initiate these interventions on an urgent basis would likely have resulted in sudden, clinically significant or life-threatening deterioration in  the patients condition.  The patient met the criteria for sepsis core measures implementation and this included parenteral fluid resuscitation, parental antibiotics, monitoring, and reassessments.       Orders/Meds:    Orders Placed This Encounter   Procedures    COVID-19 & Influenza Combo    Blood Culture 1    Blood Culture 2    XR CHEST PORTABLE    CBC with Auto Differential    CMP    Brain Natriuretic Peptide    Urinalysis    Troponin     Lactate, Sepsis    Procalcitonin    Blood Gas, Arterial    Magnesium    Weigh patient    Heated/ Humidified High Flow Nasal Cannula    Arterial Blood Gas, POC    EKG 12 Lead (Chest Pain)    Saline lock IV      ED Meds Given:  Medications   potassium chloride 10 mEq/100 mL IVPB (Peripheral Line) (has no administration in time range)   doxycycline (VIBRAMYCIN) 100 mg in sodium chloride 0.9 % 100 mL IVPB (addEASE) (has no administration in time range)   sodium chloride 0.9 % bolus 2,000 mL (has no administration in time range)   cefTRIAXone (ROCEPHIN) 2,000 mg in sodium chloride 0.9 % 50 mL IVPB (addEASE) (2,000 mg IntraVENous New Bag 11/25/23 1128)     New Prescriptions    No medications on file         ___________________  HPI: Sandra Harmon is a 68 y.o. female with past medical history of hypertension, hypothyroid, liver disease, palpitations, hypomagnesemia complains of chest pain complaint.  Patient notes development of malaise and fatigue, general lethargy, has intermittent sensation of chest discomfort in the substernal area, nonradiating.  No noted aggravating or alleviating factors.  Notes productive cough though cannot describe what type of productive cough.  Denies fever/chills, no myalgias, no abdominal pain, no nausea vomiting or diarrheal symptoms, denies history of fall or trauma, no hemoptysis, no increased lower extremity edema symptoms. Patient/family denies any other evaluation for today's acute complaint. Patient/family denies any other aggravating or alleviating factors. Patient/family denies any other symptoms.    Past Medical/ Family/ Social History:     Medical history:   Past Medical History:   Diagnosis Date    Arthritis     Chronic back pain     Colon polyp 2015    Had 5 polyps negative for cancer    Hypertension     Hyperthyroidism     Kidney stone     Only happened  once    Liver disease     Thyroid disease     hypothyroid     Surgical history:   Past Surgical History:   Procedure  Laterality Date    APPENDECTOMY      CERVICAL DISCECTOMY  09/16/06    Anterior C5-C6 diskectomy with decompression;  anterior cervical arthrodesis interbody technique;  anterior cervical instrumentation;  tricortical iliac crest bone graft harvested through a separate incision.     CHOLECYSTECTOMY      DIAGNOSTIC CARDIAC CATH LAB PROCEDURE      HYSTERECTOMY, VAGINAL  1988    TAH AND BSO (CERVIX REMOVED)      TUBAL LIGATION       Family history:   Family History   Problem Relation Age of Onset    Asthma Mother     Lung Disease Mother     High Blood Pressure Mother     Emphysema  Mother     Heart Disease Mother     Hypertension Mother     Liver Disease Father     Alcohol Abuse Father     Early Death Sister     Stroke Brother     Diabetes Brother     Alcohol Abuse Brother     Early Death Brother     Heart Attack Brother     High Blood Pressure Brother     Substance Abuse Brother     Alcohol Abuse Brother     Diabetes Brother     High Blood Pressure Brother     Substance Abuse Brother     Breast Cancer Neg Hx      Social history:   Social History     Socioeconomic History    Marital status: Divorced     Spouse name: None    Number of children: None    Years of education: None    Highest education level: None   Tobacco Use    Smoking status: Former     Current packs/day: 0.00     Average packs/day: 1 pack/day for 17.7 years (17.7 ttl pk-yrs)     Types: Cigarettes     Start date: 02/26/1979     Quit date: 10/25/1996     Years since quitting: 27.1     Passive exposure: Never    Smokeless tobacco: Former   Substance and Sexual Activity    Alcohol use: No    Drug use: No    Sexual activity: Not Currently     Partners: Male     Social Drivers of Health     Financial Resource Strain: Low Risk  (06/20/2022)    Overall Financial Resource Strain (CARDIA)     Difficulty of Paying Living Expenses: Not hard at all   Food Insecurity: No Food Insecurity (03/29/2023)    Hunger Vital Sign     Worried About Running Out of Food in the Last Year:  Never true     Ran Out of Food in the Last Year: Never true   Transportation Needs: No Transportation Needs (03/29/2023)    PRAPARE - Therapist, art (Medical): No     Lack of Transportation (Non-Medical): No   Physical Activity: Insufficiently Active (10/02/2023)    Exercise Vital Sign     Days of Exercise per Week: 2 days     Minutes of Exercise per Session: 60 min   Housing Stability: Low Risk  (03/29/2023)    Housing Stability Vital Sign     Unable to Pay for Housing in the Last Year: No     Number of Times Moved in the Last Year: 0     Homeless in the Last Year: No     Medications:   Previous Medications    ALBUTEROL  SULFATE HFA (PROVENTIL ;VENTOLIN ;PROAIR ) 108 (90 BASE) MCG/ACT INHALER    Inhale 2 puffs into the lungs every 6 hours as needed for Wheezing    ALENDRONATE  (FOSAMAX ) 70 MG TABLET    Take 1 tablet by mouth every 7 days    ASPIRIN 81 MG CHEWABLE TABLET    Take by mouth daily    BACLOFEN  (LIORESAL ) 10 MG TABLET    Take 1 tablet by mouth 2 times daily    LEVOTHYROXINE  (SYNTHROID ) 75 MCG TABLET    Take 1 tablet by mouth every morning (before breakfast)    PRAVASTATIN  (PRAVACHOL ) 20 MG TABLET    Take 1  tablet by mouth daily    PREDNISONE (DELTASONE) 5 MG TABLET    Take 1 tablet by mouth daily    SERTRALINE  (ZOLOFT ) 50 MG TABLET    Take 1 tablet by mouth daily      Allergies:   Allergies   Allergen Reactions    Atorvastatin Other (See Comments)     Muscle pain    Hydrocodone-Acetaminophen  Itching    Penicillins Hives       Physical Exam   Vitals signs reviewed.   Patient Vitals for the past 4 hrs:   Temp Pulse Resp BP SpO2   11/25/23 1036 -- (!) 110 (!) 31 -- 95 %   11/25/23 1006 -- (!) 109 (!) 32 -- (!) 89 %   11/25/23 1002 97.8 F (36.6 C) (!) 111 (!) 38 (!) 174/55 (!) 86 %     General: Alert and oriented x 4, guarded clinical appearance, no acute distress  Eyes: Anicteric, conjunctiva pink, PERRLA, EOMI  ENT: Minimal nasal congestion present, oral mucosa membranes are  dry  Pulmonary: Intermittent/mild tachypnea with mild increased work of breathing, no focal crackles heard, no wheezing  Cardiovascular: Tachycardia, regular, no rub or gallop appreciated on my exam  GI: Abdomen is soft, nontender, nondistended  Musculoskeletal: No obvious joint deformity or joint effusion, normal joint range of motion  Neuro: Cranial nerves II through VII grossly intact, strength and sensation is grossly intact in the upper and lower extremities bilaterally  Skin: Skin is warm and dry    Procedures  Results for orders placed or performed during the hospital encounter of 11/25/23   COVID-19 & Influenza Combo    Specimen: Swab   Result Value Ref Range    Source NASAL      SARS-CoV-2, Rapid Not detected NOTD      Influenza A, NAA Not detected NOTD      Influenza B, NAA Not detected NOTD     XR CHEST PORTABLE    Narrative    Chest X-ray    INDICATION: Chest Pain    COMPARISON: 12/31/2022    TECHNIQUE: AP/PA view of the chest was obtained.    FINDINGS: Diffuse bilateral consolidation is present. EKG leads are present.    There are no large pleural effusions.    Anterior fixation hardware is present in the cervical spine.      Impression    Diffuse bilateral consolidation. This may be caused by pulmonary  edema or pneumonia.      Electronically signed by NORLEEN JACINTO RADDLE, MD   CBC with Auto Differential   Result Value Ref Range    WBC 11.5 (H) 4.3 - 11.1 K/uL    RBC 4.51 4.05 - 5.2 M/uL    Hemoglobin 14.2 11.7 - 15.4 g/dL    Hematocrit 58.3 64.1 - 46.3 %    MCV 92.2 82.0 - 102.0 FL    MCH 31.5 26.1 - 32.9 PG    MCHC 34.1 31.4 - 35.0 g/dL    RDW 86.4 88.0 - 85.3 %    Platelets 156 150 - 450 K/uL    MPV 9.9 9.4 - 12.3 FL    nRBC 0.06 0.0 - 0.2 K/uL    Neutrophils % 61 47 - 75 %    Lymphocytes 17 16 - 44 %    Monocytes % 5 3 - 9 %    Metamyelocytes 5 %    Myelocytes 10 %    Promyelocytes 2 %    Neutrophils Absolute 8.96 (  H) 1.7 - 8.2 K/UL    Lymphocytes Absolute 1.96 0.5 - 4.6 K/UL    Monocytes Absolute 0.58  0.1 - 1.3 K/UL    RBC Comment SLIGHT  POLYCHROMASIA        RBC Comment SLIGHT  ANISOCYTOSIS        WBC Comment SLIGHT      Platelet Comment ADEQUATE      Differential Type MANUAL     CMP   Result Value Ref Range    Sodium 132 (L) 136 - 145 mmol/L    Potassium 2.9 (L) 3.5 - 5.1 mmol/L    Chloride 95 (L) 98 - 107 mmol/L    CO2 22 20 - 29 mmol/L    Anion Gap 15 7 - 16 mmol/L    Glucose 122 (H) 70 - 99 mg/dL    BUN 12 8 - 23 MG/DL    Creatinine 9.25 9.39 - 1.10 MG/DL    Est, Glom Filt Rate 89 >60 ml/min/1.73m2    Calcium 8.6 (L) 8.8 - 10.2 MG/DL    Total Bilirubin 3.4 (H) 0.0 - 1.2 MG/DL    ALT 35 8 - 45 U/L    AST 67 (H) 15 - 37 U/L    Alk Phosphatase 101 35 - 104 U/L    Total Protein 5.9 (L) 6.3 - 8.2 g/dL    Albumin 2.5 (L) 3.2 - 4.6 g/dL    Globulin 3.4 2.3 - 3.5 g/dL    Albumin/Globulin Ratio 0.7 (L) 1.0 - 1.9     Brain Natriuretic Peptide   Result Value Ref Range    NT Pro-BNP 145 (H) 0 - 125 PG/ML   Urinalysis   Result Value Ref Range    Color, UA DARK YELLOW      Appearance CLEAR      Specific Gravity, UA 1.015 1.001 - 1.023      pH, Urine 6.0 5.0 - 9.0      Protein, UA TRACE (A) NEG mg/dL    Glucose, Ur Negative NEG mg/dL    Ketones, Urine 15 (A) NEG mg/dL    Bilirubin, Urine SMALL (A) NEG      Blood, Urine Negative NEG      Urobilinogen, Urine >8.0 (H) 0.2 - 1.0 EU/dL    Nitrite, Urine Negative NEG      Leukocyte Esterase, Urine Negative NEG      WBC, UA 0-4 U4 /hpf    RBC, UA 0-5 U5 /hpf    Epithelial Cells, UA 5-10 (A) U5 /hpf    BACTERIA, URINE Negative NEG /hpf    Hyaline Casts, UA 0-2 /lpf   Troponin   Result Value Ref Range    Troponin T <6.0 0 - 14 ng/L   Lactate, Sepsis   Result Value Ref Range    Lactic Acid, Sepsis 3.4 (H) 0.5 - 2.0 MMOL/L   Procalcitonin   Result Value Ref Range    Procalcitonin 1.90 (H) 0.00 - 0.10 ng/mL   Magnesium   Result Value Ref Range    Magnesium 1.9 1.8 - 2.4 mg/dL   Arterial Blood Gas, POC   Result Value Ref Range    DEVICE CPAP      POC pH 7.52 (H) 7.35 - 7.45      POC  pCO2 26.1 (L) 35 - 45 MMHG    POC PO2 52 (L) 75 - 100 MMHG    POC HCO3 21.4 21 - 28 MMOL/L    POC O2 SAT 90.6 (L) 94 -  98 %    Base Excess 0.0 mmol/L    Mode Non Invasive      POC Allen's Test Positive      Site RIGHT RADIAL      Specimen type: ARTERIAL      Performed by: [email or web address]     Critical Value Read Back RNSEAN    EKG 12 Lead (Chest Pain)   Result Value Ref Range    Ventricular Rate 114 BPM    Atrial Rate 113 BPM    P-R Interval 133 ms    QRS Duration 103 ms    Q-T Interval 481 ms    QTc Calculation (Bazett) 663 ms    P Axis 67 degrees    R Axis 49 degrees    T Axis 59 degrees    Diagnosis       Sinus tachycardia  LAE, consider biatrial enlargement  Non-specific ST-t wave changes      Confirmed by [facility]  MD (UC), MATTHEW G ([id number]) on 11/25/2023 11:02:30 AM        XR CHEST PORTABLE   Final Result   Diffuse bilateral consolidation. This may be caused by pulmonary   edema or pneumonia.         Electronically signed by NORLEEN JACINTO RADDLE, MD                      Voice dictation software was used during the making of this note.  This software is not perfect and grammatical and other typographical errors may be present.  This note has not been completely proofread for errors.

## 2023-11-25 NOTE — Care Coordination-Inpatient (Signed)
 SW met with the patient and her son Sandra Harmon at the bedside. Demographics confirmed. Patient has been living with her son and was scheduled to move into an apartment today. Patient is insured and established with primary care. She does not use assistive devices to ambulate, is independent in her ADLs, and denies any falls. Patient on Airvo, does not require O2 at baseline. Patient anticipated to admit. CMI, Milestones, and ACP completed.        11/25/23 1440   Service Assessment   Information Provided By Patient;Child/Family   Patient Orientation Alert;Oriented;Person;Place;Situation;Self   Cognition No Apparent Deficit   Primary Caregiver Self   Support Systems Children   Patient's Healthcare Decision Maker is: Legal Next of Kin   PCP Verified by CM Yes   Prior Functional Level Independent in ADLs/IADLs   History of falls? 0   Current Functional Level at Time of Initial Assessment Independent in ADLs/IADLs   Can patient return to prior living arrangement Yes   Ability to make needs known: Good   Family able to assist with home care needs: Yes   Other than yourself, who should be included in your transition plan for discharge from the hospital? Sandra Harmon [facility] From [facility]   Current Community Resources None   Discharge Planning   Patient expects to be discharged to: Home       Sandra Harmon, LMSW  Medical Social Worker  [facility]

## 2023-11-25 NOTE — Other (Signed)
 11/25/23 2207   Treatment Team Notification   Reason for Communication Critical results   Type of Critical Result Laboratory   Critical Lab Result Type Lactic acid   Critical Lab Information with Verbal Readback LA 2.9   Name of Team Member Notified Dr. Berkeley   Treatment Team Role Attending Provider   Method of Communication Secure Message   Response See orders   Notification Time 2207       Repeat lactic acid ordered once pt arrived to ICU since no lactic had been ordered since 1536. Lactic at 1536 was 2.2. Lactic at 2136 came back at 2.9. Per Dr. Berkeley, q4h lactic acid until less than 2.0.

## 2023-11-25 NOTE — Unmapped (Signed)
 Patient is tachpneac with a RR of 40.  O2 sat 92% son with patient    Chest harsh bronchovesicular breath sounds.  Less than 3 sec cap       Over one liter urine

## 2023-11-25 NOTE — H&P (Signed)
 Hospitalist History and Physical   Admit Date:  11/25/2023  9:54 AM   Name:  Sandra Harmon   Age:  68 y.o.  Sex:  female  DOB:  13-Jun-1955   MRN:  [id number]   Room:  ER03/03    Presenting/Chief Complaint: Chest Pain and Shortness of Breath     Reason(s) for Admission: Community acquired pneumonia, bilateral [J18.9]     History of Present Illness:   Sandra Harmon is a 68 y.o. female with medical history of depression, hyperlipidemia, hypothyroidism, osteoporosis, chronic back pain, chronic fatigue, left kidney mass/cyst, and obesity who comes to the emergency room due to chest pain and shortness of breath that started yesterday, 9/29.  She works at a daycare and her son reports that she was also very lethargic today.  In the emergency room she was tachypneic and tachycardic.      Temperature is 97.8, respirations 38, pulse 111, blood pressure 174/55, oxygen saturation 86% on room air.  She was placed on heated high flow at 40 L with 60% FiO2.    Labs were obtained and show sodium 132, potassium 2.9, chloride 95, lactic acid of 3.4, Pro-Cal of 1.9, ABG was obtained while on CPAP/BiPAP this morning and showed pH 7.52, pCO2 26.1, pO2 52, Pa O2 sat 90.6, HCO3 21.4.  Total bilirubin was elevated at 3.4 as well as AST of 67 CBC was obtained and showed a white count of 11.5.  Blood cultures were drawn.  COVID was negative.  UA was negative for infection.  A chest x-ray was obtained and showed diffuse bilateral consolidation which may be caused by pulmonary edema or pneumonia.    She was given doxycycline and Rocephin IV in the emergency room as well as potassium and 2 L of normal saline.  Unfortunately she began to get even more tachypneic and work of breathing increased.  Nurse in the emergency room messaged me to continue to give me updates and as she worsened I asked the ICU Rover, Jenkins PEAK, to go down and see her.  She continued to be tachypneic and have increased work of breathing.  We went to the bedside  together and ABG was obtained again as well as a dose of IV Lasix.  Foley was placed and she immediately had a significant amount of urine output.  Repeat ABG showed pH of 7.49, pCO2 of 29, PaO2 of 51, PaO2 sat of 87.7, and HCO3 of 21.8.    She was able to speak with me during this time though she was quite tachypneic.  She was able to answer all my questions and we spoke about her plan of care.  Her son was also in the room and I spoke with him about her plan of care.  We will now transfer her to ICU for closer monitoring.  Did state that if she needs to be on BiPAP or intubated this is something that she would want to do if needed.      Patient is critically ill.  Without these interventions, there is a high probability of imminent acute organ impairment or life-threatening deterioration.  Total critical care time spent: 55 minutes.    Critical care time includes time spent at bedside performing history and exam, performing chart review, discussing findings and treatment plan with patient and/or family, discussing patient with consultants and colleagues, ordering and reviewing pertinent laboratory and radiographic evaluations, and discussing patient with nursing staff. Time excludes procedures.      Assessment &  Plan:     Principal Problem:    Severe sepsis (HCC)  --Meets with tachypnea, tachycardia, lactic acidosis, leukocytosis, and bilateral pneumonia  --Given IV Rocephin and doxycycline which has been continued  - Was initially given 2 L of normal saline but felt that this contributed to her shortness of breath and was given Lasix.  - Will monitor very closely and if needed can restart gentle hydration but right now we will need to monitor closely  -Lactic acidosis has decreased from 3.4-2.2      Active Problems:    Community acquired pneumonia, bilateral    Acute hypoxic respiratory failure (HCC)  --Continue IV Rocephin and doxycycline at this time  - May need to add steroids if she is not improving  - Will  add as needed DuoNebs  --Continue supplemental oxygen at this time also would be okay with BiPAP or intubation if needed, hopefully we can avoid this      High Anion Gap Metabolic Acidosis  Respiratory Alkalosis  -- Anion gap currently 15, ABG shows decreased CO2, will need to monitor this closely and hopefully will improve with treatment as above  - Will repeat BMP tonight to make sure going the right direction        Hypokalemia  -Given IV potassium in the ER but also given Lasix, will repeat BMP and correct as needed        Lactic acidosis    Leukocytosis  -- Lactic acidosis is already improving but we will continue to monitor this  - Will also CBC in the morning      Depression  -Continue Zoloft       Hyperlipidemia  -Continue pravastatin       Hypothyroidism  -Continue Synthroid   --TSH in August normal      GERD  - Oral PPI changed to IV at this time       PT/OT evals ordered?  Defer until more stable  Diet: ADULT DIET; Regular  VTE prophylaxis: Lovenox  Code status: Full Code  Code status discussed: Yes  Blood consent obtained: N/A      Non-peripheral Lines and Tubes (if present):             Hospital Problems:  Principal Problem:    Severe sepsis (HCC)  Active Problems:    Community acquired pneumonia, bilateral    Acute hypoxic respiratory failure (HCC)    Hypokalemia    Lactic acidosis  Resolved Problems:    * No resolved hospital problems. *        Objective:   Patient Vitals for the past 24 hrs:   Temp Pulse Resp BP SpO2   11/25/23 1900 -- (!) 102 28 (!) 118/56 90 %   11/25/23 1845 -- 100 (!) 32 (!) 104/54 90 %   11/25/23 1830 -- 100 (!) 31 (!) 112/57 90 %   11/25/23 1815 -- (!) 103 14 (!) 116/52 (!) 88 %   11/25/23 1800 -- (!) 104 23 122/65 90 %   11/25/23 1740 -- (!) 102 29 (!) 110/56 91 %   11/25/23 1730 -- (!) 102 30 (!) 112/52 91 %   11/25/23 1615 -- (!) 109 30 -- 92 %   11/25/23 1330 -- (!) 107 -- (!) 142/89 --   11/25/23 1317 -- (!) 109 (!) 34 (!) 148/73 (!) 89 %   11/25/23 1304 -- (!) 108 (!) 33 --  96 %   11/25/23 1302 -- -- -- (!) 145/74 --   11/25/23  1217 -- (!) 110 24 122/63 90 %   11/25/23 1202 -- (!) 110 27 (!) 112/55 91 %   11/25/23 1147 -- (!) 108 29 (!) 123/46 92 %   11/25/23 1036 -- (!) 110 (!) 31 -- 95 %   11/25/23 1006 -- (!) 109 (!) 32 -- (!) 89 %   11/25/23 1002 97.8 F (36.6 C) (!) 111 (!) 38 (!) 174/55 (!) 86 %       Oxygen Therapy  SpO2: 90 %  Pulse via Oximetry: 101 beats per minute  O2 Device: Heated high flow cannula  O2 Flow Rate (L/min): 40 L/min  FiO2 : 60 %  Blood Gas  Performed?: Yes  Allen's Test #1: Collateral flow confirmed  Site #1: Right Radial  Site Prepped #1: Yes  Number of Attempts #1: 1  Pressure Held #1: Yes  Complications #1: None  Post-procedure #1: Standard  Specimen Status #1: Point of care  How Tolerated?: Tolerated well    Estimated body mass index is 36.13 kg/m as calculated from the following:    Height as of this encounter: 1.524 m (5').    Weight as of 10/02/23: 83.9 kg (185 lb).    Intake/Output Summary (Last 24 hours) at 11/25/2023 1913  Last data filed at 11/25/2023 1631  Gross per 24 hour   Intake --   Output 1000 ml   Net -1000 ml         Physical Exam:  General:    Acutely ill, looks a bit in distress and tachypneic, but alert and oriented x 3  Head:  Normocephalic, atraumatic  Eyes:  Sclerae appear normal.  Pupils equally round.  ENT:  Nares appear normal.  Moist oral mucosa  Neck:  No restricted ROM.  Trachea midline   CV:   RRR.  No jugular venous distension.  Lungs:   Tachypneic, diminished at the bases.  No wheezing, rhonchi, or rales.  Symmetric expansion.  Abdomen:   Soft, nontender, nondistended.  Obese  Extremities: No cyanosis or clubbing.  No edema  Skin:     No rashes.  Normal coloration.   Warm and dry.    Neuro:  CN II-XII grossly intact.  Sensation intact.   Psych:  Mood and affect appropriate      Orders Placed This Encounter   Medications    cefTRIAXone (ROCEPHIN) 2,000 mg in sodium chloride 0.9 % 50 mL IVPB (addEASE)     Antimicrobial  Indications:   Pneumonia (CAP)    potassium chloride 10 mEq/100 mL IVPB (Peripheral Line)    doxycycline (VIBRAMYCIN) 100 mg in sodium chloride 0.9 % 100 mL IVPB (addEASE)     Antimicrobial Indications:   Pneumonia (CAP)    sodium chloride 0.9 % bolus 2,000 mL    levothyroxine  (SYNTHROID ) tablet 75 mcg    pravastatin  (PRAVACHOL ) tablet 20 mg    sertraline  (ZOLOFT ) tablet 50 mg    sodium chloride flush 0.9 % injection 5-40 mL    sodium chloride flush 0.9 % injection 5-40 mL    0.9 % sodium chloride infusion    OR Linked Order Group     potassium chloride (KLOR-CON M) extended release tablet 40 mEq     potassium bicarb-citric acid (EFFER-K) effervescent tablet 40 mEq     potassium chloride 10 mEq/100 mL IVPB (Peripheral Line)    magnesium sulfate 2000 mg in 50 mL IVPB premix    enoxaparin (LOVENOX) injection 40 mg     Indication of Use:  Prophylaxis-DVT/PE    OR Linked Order Group     ondansetron (ZOFRAN-ODT) disintegrating tablet 4 mg     ondansetron (ZOFRAN) injection 4 mg    polyethylene glycol (GLYCOLAX) packet 17 g    cefTRIAXone (ROCEPHIN) 1,000 mg in sterile water 10 mL IV syringe     Antimicrobial Indications:   Pneumonia (CAP)     CAP duration of therapy:   Other     Other CAP Duration:   4 days    DISCONTD: doxycycline hyclate (VIBRAMYCIN) capsule 100 mg     Antimicrobial Indications:   Pneumonia (CAP)     CAP duration of therapy:   5 days    furosemide (LASIX) injection 40 mg    doxycycline (VIBRAMYCIN) 100 mg in sodium chloride 0.9 % 100 mL IVPB (addEASE)     Antimicrobial Indications:   Pneumonia (CAP)     CAP duration of therapy:   5 days    pantoprazole (PROTONIX) 40 mg in sodium chloride (PF) 0.9 % 10 mL injection       Prior to Admit Medications:  Current Outpatient Medications   Medication Instructions    albuterol  sulfate HFA (PROVENTIL ;VENTOLIN ;PROAIR ) 108 (90 Base) MCG/ACT inhaler 2 puffs, Inhalation, EVERY 6 HOURS PRN    alendronate  (FOSAMAX ) 70 mg, Oral, EVERY 7 DAYS    aspirin 81 MG chewable  tablet DAILY    baclofen  (LIORESAL ) 10 mg, Oral, 2 TIMES DAILY    levothyroxine  (SYNTHROID ) 75 mcg, Oral, DAILY BEFORE BREAKFAST    pravastatin  (PRAVACHOL ) 20 mg, Oral, DAILY    predniSONE (DELTASONE) 5 mg, DAILY    sertraline  (ZOLOFT ) 50 mg, Oral, DAILY       I have personally reviewed labs and tests:  Recent Results (from the past 24 hours)   EKG 12 Lead (Chest Pain)    Collection Time: 11/25/23 10:00 AM   Result Value Ref Range    Ventricular Rate 114 BPM    Atrial Rate 113 BPM    P-R Interval 133 ms    QRS Duration 103 ms    Q-T Interval 481 ms    QTc Calculation (Bazett) 663 ms    P Axis 67 degrees    R Axis 49 degrees    T Axis 59 degrees    Diagnosis       Sinus tachycardia  LAE, consider biatrial enlargement  Non-specific ST-t wave changes      Confirmed by [facility]  MD (UC), MATTHEW G ([id number]) on 11/25/2023 11:02:30 AM     Arterial Blood Gas, POC    Collection Time: 11/25/23 10:41 AM   Result Value Ref Range    DEVICE CPAP      POC pH 7.52 (H) 7.35 - 7.45      POC pCO2 26.1 (L) 35 - 45 MMHG    POC PO2 52 (L) 75 - 100 MMHG    POC HCO3 21.4 21 - 28 MMOL/L    POC O2 SAT 90.6 (L) 94 - 98 %    Base Excess 0.0 mmol/L    Mode Non Invasive      POC Allen's Test Positive      Site RIGHT RADIAL      Specimen type: ARTERIAL      Performed by: ReedJacquelineRT     Critical Value Read Back RNSEAN    Brain Natriuretic Peptide    Collection Time: 11/25/23 11:01 AM   Result Value Ref Range    NT Pro-BNP 145 (H) 0 - 125 PG/ML  COVID-19 & Influenza Combo    Collection Time: 11/25/23 11:01 AM    Specimen: Swab   Result Value Ref Range    Source NASAL      SARS-CoV-2, Rapid Not detected NOTD      Influenza A, NAA Not detected NOTD      Influenza B, NAA Not detected NOTD     Urinalysis    Collection Time: 11/25/23 11:01 AM   Result Value Ref Range    Color, UA DARK YELLOW      Appearance CLEAR      Specific Gravity, UA 1.015 1.001 - 1.023      pH, Urine 6.0 5.0 - 9.0      Protein, UA TRACE (A) NEG mg/dL    Glucose, Ur Negative  NEG mg/dL    Ketones, Urine 15 (A) NEG mg/dL    Bilirubin, Urine SMALL (A) NEG      Blood, Urine Negative NEG      Urobilinogen, Urine >8.0 (H) 0.2 - 1.0 EU/dL    Nitrite, Urine Negative NEG      Leukocyte Esterase, Urine Negative NEG      WBC, UA 0-4 U4 /hpf    RBC, UA 0-5 U5 /hpf    Epithelial Cells, UA 5-10 (A) U5 /hpf    BACTERIA, URINE Negative NEG /hpf    Hyaline Casts, UA 0-2 /lpf   Troponin    Collection Time: 11/25/23 11:01 AM   Result Value Ref Range    Troponin T <6.0 0 - 14 ng/L   Lactate, Sepsis    Collection Time: 11/25/23 11:01 AM   Result Value Ref Range    Lactic Acid, Sepsis 3.4 (H) 0.5 - 2.0 MMOL/L   Procalcitonin    Collection Time: 11/25/23 11:01 AM   Result Value Ref Range    Procalcitonin 1.90 (H) 0.00 - 0.10 ng/mL   CBC with Auto Differential    Collection Time: 11/25/23 11:02 AM   Result Value Ref Range    WBC 11.5 (H) 4.3 - 11.1 K/uL    RBC 4.51 4.05 - 5.2 M/uL    Hemoglobin 14.2 11.7 - 15.4 g/dL    Hematocrit 58.3 64.1 - 46.3 %    MCV 92.2 82.0 - 102.0 FL    MCH 31.5 26.1 - 32.9 PG    MCHC 34.1 31.4 - 35.0 g/dL    RDW 86.4 88.0 - 85.3 %    Platelets 156 150 - 450 K/uL    MPV 9.9 9.4 - 12.3 FL    nRBC 0.06 0.0 - 0.2 K/uL    Neutrophils % 61 47 - 75 %    Lymphocytes 17 16 - 44 %    Monocytes % 5 3 - 9 %    Metamyelocytes 5 %    Myelocytes 10 %    Promyelocytes 2 %    Neutrophils Absolute 8.96 (H) 1.7 - 8.2 K/UL    Lymphocytes Absolute 1.96 0.5 - 4.6 K/UL    Monocytes Absolute 0.58 0.1 - 1.3 K/UL    RBC Comment SLIGHT  POLYCHROMASIA        RBC Comment SLIGHT  ANISOCYTOSIS        WBC Comment SLIGHT      Platelet Comment ADEQUATE      Differential Type MANUAL     CMP    Collection Time: 11/25/23 11:02 AM   Result Value Ref Range    Sodium 132 (L) 136 - 145 mmol/L    Potassium 2.9 (L) 3.5 -  5.1 mmol/L    Chloride 95 (L) 98 - 107 mmol/L    CO2 22 20 - 29 mmol/L    Anion Gap 15 7 - 16 mmol/L    Glucose 122 (H) 70 - 99 mg/dL    BUN 12 8 - 23 MG/DL    Creatinine 9.25 9.39 - 1.10 MG/DL    Est, Glom  Filt Rate 89 >60 ml/min/1.24m2    Calcium 8.6 (L) 8.8 - 10.2 MG/DL    Total Bilirubin 3.4 (H) 0.0 - 1.2 MG/DL    ALT 35 8 - 45 U/L    AST 67 (H) 15 - 37 U/L    Alk Phosphatase 101 35 - 104 U/L    Total Protein 5.9 (L) 6.3 - 8.2 g/dL    Albumin 2.5 (L) 3.2 - 4.6 g/dL    Globulin 3.4 2.3 - 3.5 g/dL    Albumin/Globulin Ratio 0.7 (L) 1.0 - 1.9     Magnesium    Collection Time: 11/25/23 11:02 AM   Result Value Ref Range    Magnesium 1.9 1.8 - 2.4 mg/dL   Lactate, Sepsis    Collection Time: 11/25/23  3:36 PM   Result Value Ref Range    Lactic Acid, Sepsis 2.2 (H) 0.5 - 2.0 MMOL/L   Arterial Blood Gas, POC    Collection Time: 11/25/23  4:14 PM   Result Value Ref Range    DEVICE High Frequency Oscillator Ventilator      FIO2 60 %    POC pH 7.49 (H) 7.35 - 7.45      POC pCO2 29.0 (L) 35 - 45 MMHG    POC PO2 51 (L) 75 - 100 MMHG    POC HCO3 21.8 21 - 28 MMOL/L    POC O2 SAT 87.7 (L) 94 - 98 %    Base Deficit (POC) 0.3 mmol/L    POC Allen's Test Positive      Site RIGHT RADIAL      Pt Temp 100      Specimen type: ARTERIAL      Performed by: ReedJacquelineRT     Critical Value Read Back [email or web address]        Recent Labs     11/25/23  1101   COVID19 Not detected       XR CHEST PORTABLE  Result Date: 11/25/2023  Chest X-ray INDICATION: Chest Pain COMPARISON: 12/31/2022 TECHNIQUE: AP/PA view of the chest was obtained. FINDINGS: Diffuse bilateral consolidation is present. EKG leads are present. There are no large pleural effusions. Anterior fixation hardware is present in the cervical spine.     Diffuse bilateral consolidation. This may be caused by pulmonary edema or pneumonia. Electronically signed by NORLEEN JACINTO RADDLE, MD        Signed:  Mitzie [place], DO    Part of this note may have been written by using a voice dictation software.  The note has been proof read but may still contain some grammatical/other typographical errors.

## 2023-11-26 ENCOUNTER — Inpatient Hospital Stay: Admit: 2023-11-26 | Payer: Medicare (Managed Care)

## 2023-11-26 LAB — RESPIRATORY PANEL, MOLECULAR, WITH COVID-19

## 2023-11-26 LAB — LACTIC ACID
Lactic Acid: 2.9 mmol/L — ABNORMAL HIGH (ref 0.5–2.0)
Lactic Acid: 2.4 mmol/L — ABNORMAL HIGH (ref 0.5–2.0)
Lactic Acid: 1.7 mmol/L (ref 0.5–2.0)
Lactic Acid: 2.5 mmol/L — ABNORMAL HIGH (ref 0.5–2.0)
Lactic Acid: 1.9 mmol/L (ref 0.5–2.0)

## 2023-11-26 LAB — CBC WITH AUTO DIFFERENTIAL
Basophils %: 5.7 % — ABNORMAL HIGH (ref 0.0–2.0)
Basophils Absolute: 1 K/UL — ABNORMAL HIGH (ref 0.00–0.20)
Eosinophils %: 0.7 % (ref 0.5–7.8)
Eosinophils Absolute: 0.12 K/UL (ref 0.00–0.80)
Hematocrit: 43.7 % (ref 35.8–46.3)
Hemoglobin: 14 g/dL (ref 11.7–15.4)
Immature Granulocytes %: 9.1 % — ABNORMAL HIGH (ref 0.0–5.0)
Immature Granulocytes Absolute: 1.6 K/UL — ABNORMAL HIGH (ref 0.0–0.5)
Lymphocytes %: 10.6 % — ABNORMAL LOW (ref 13.0–44.0)
Lymphocytes Absolute: 1.87 K/UL (ref 0.50–4.60)
MCH: 31.4 pg (ref 26.1–32.9)
MCHC: 32 g/dL (ref 31.4–35.0)
MCV: 98 FL (ref 82.0–102.0)
MPV: 10.1 FL (ref 9.4–12.3)
Monocytes %: 4.2 % (ref 4.0–12.0)
Monocytes Absolute: 0.74 K/UL (ref 0.10–1.30)
Neutrophils %: 69.7 % (ref 43.0–78.0)
Neutrophils Absolute: 12.27 K/UL — ABNORMAL HIGH (ref 1.70–8.20)
Platelets: 120 K/uL — ABNORMAL LOW (ref 150–450)
RBC: 4.46 M/uL (ref 4.05–5.2)
RDW: 14.1 % (ref 11.9–14.6)
WBC: 17.6 K/uL — ABNORMAL HIGH (ref 4.3–11.1)
nRBC: 0.13 K/uL (ref 0.0–0.2)

## 2023-11-26 LAB — BASIC METABOLIC PANEL W/ REFLEX TO MG FOR LOW K
Anion Gap: 12 mmol/L (ref 7–16)
BUN: 14 mg/dL (ref 8–23)
CO2: 23 mmol/L (ref 20–29)
Calcium: 8.2 mg/dL — ABNORMAL LOW (ref 8.8–10.2)
Chloride: 102 mmol/L (ref 98–107)
Creatinine: 0.69 mg/dL (ref 0.60–1.10)
Est, Glom Filt Rate: >90 ml/min/1.73m2 (ref >60)
Glucose: 118 mg/dL — ABNORMAL HIGH (ref 70–99)
Potassium: 2.8 mmol/L — ABNORMAL LOW (ref 3.5–5.1)
Sodium: 137 mmol/L (ref 136–145)

## 2023-11-26 LAB — POTASSIUM: Potassium: 3.3 mmol/L — ABNORMAL LOW (ref 3.5–5.1)

## 2023-11-26 LAB — MAGNESIUM: Magnesium: 1.9 mg/dL (ref 1.8–2.4)

## 2023-11-26 LAB — TROPONIN: Troponin T: 7 ng/L (ref 0–14)

## 2023-11-26 MED ORDER — METHYLPREDNISOLONE SODIUM SUCC 40 MG IJ SOLR
40 | Freq: Two times a day (BID) | INTRAMUSCULAR | Status: DC
Start: 2023-11-26 — End: 2023-11-27
  Administered 2023-11-26 – 2023-11-27 (×2): 40 mg via INTRAVENOUS

## 2023-11-26 MED ORDER — SULFUR HEXAFLUORIDE MICROSPH 60.7-25 MG IJ SUSR
60.7-25 | Freq: Once | INTRAMUSCULAR | Status: DC | PRN
Start: 2023-11-26 — End: 2023-11-27

## 2023-11-26 MED ORDER — OXYCODONE-ACETAMINOPHEN 5-325 MG PO TABS
5-325 | Freq: Four times a day (QID) | ORAL | Status: DC | PRN
Start: 2023-11-26 — End: 2023-11-27
  Administered 2023-11-26 – 2023-11-27 (×5): 1 via ORAL

## 2023-11-26 MED ORDER — ENOXAPARIN SODIUM 40 MG/0.4ML IJ SOSY
40 | Freq: Every day | INTRAMUSCULAR | Status: DC
Start: 2023-11-26 — End: 2023-11-27
  Administered 2023-11-26 – 2023-11-27 (×2): 40 mg via SUBCUTANEOUS

## 2023-11-26 MED ORDER — FUROSEMIDE 10 MG/ML IJ SOLN
10 | Freq: Once | INTRAMUSCULAR | Status: AC
Start: 2023-11-26 — End: 2023-11-26
  Administered 2023-11-26: 19:00:00 40 mg via INTRAVENOUS

## 2023-11-26 MED ORDER — LIP-GUARD EX OINT
CUTANEOUS | Status: DC | PRN
Start: 2023-11-26 — End: 2023-11-27
  Administered 2023-11-26: 10:00:00 7 via TOPICAL

## 2023-11-26 MED FILL — LOVENOX 40 MG/0.4ML IJ SOSY: 40 MG/0.4ML | INTRAMUSCULAR | Qty: 0.4 | Fill #0

## 2023-11-26 MED FILL — POTASSIUM CHLORIDE 10 MEQ/100ML IV SOLN: 10 MEQ/0ML | INTRAVENOUS | Qty: 100 | Fill #0

## 2023-11-26 MED FILL — LUMASON 60.7-25 MG IJ SUSR: 60.7-25 mg | INTRAMUSCULAR | Qty: 2 | Fill #0

## 2023-11-26 MED FILL — OXYCODONE-ACETAMINOPHEN 5-325 MG PO TABS: 5-325 mg | ORAL | Qty: 1 | Fill #0

## 2023-11-26 MED FILL — FUROSEMIDE 10 MG/ML IJ SOLN: 10 mg/mL | INTRAMUSCULAR | Qty: 4 | Fill #0

## 2023-11-26 MED FILL — PANTOPRAZOLE SODIUM 40 MG IV SOLR: 40 mg | INTRAVENOUS | Qty: 40 | Fill #0

## 2023-11-26 MED FILL — METHYLPREDNISOLONE SODIUM SUCC 40 MG IJ SOLR: 40 mg | INTRAMUSCULAR | Qty: 40 | Fill #0

## 2023-11-26 MED FILL — LIP-GUARD EX OINT: CUTANEOUS | Qty: 7 | Fill #0

## 2023-11-26 MED FILL — LEVOTHYROXINE SODIUM 75 MCG PO TABS: 75 ug | ORAL | Qty: 1 | Fill #0

## 2023-11-26 MED FILL — DOXY 100 100 MG IV SOLR: 100 mg | INTRAVENOUS | Qty: 100 | Fill #0

## 2023-11-26 MED FILL — EFFER-K 20 MEQ PO TBEF: 20 meq | ORAL | Qty: 2 | Fill #0

## 2023-11-26 MED FILL — CEFTRIAXONE SODIUM 1 G IJ SOLR: 1 g | INTRAMUSCULAR | Qty: 1000 | Fill #0

## 2023-11-26 MED FILL — IPRATROPIUM-ALBUTEROL 0.5-2.5 (3) MG/3ML IN SOLN: 0.5-2.5 (3) MG/3ML | RESPIRATORY_TRACT | Qty: 3 | Fill #0

## 2023-11-26 NOTE — Plan of Care (Signed)
 Problem: Discharge Planning  Goal: Discharge to home or other facility with appropriate resources  11/26/2023 1038 by Robyne Joen SAILOR, RN  Outcome: Progressing  Flowsheets (Taken 11/26/2023 0800)  Discharge to home or other facility with appropriate resources: Identify barriers to discharge with patient and caregiver  11/25/2023 2350 by April Marquita SAILOR, RN  Outcome: Progressing  Flowsheets  Taken 11/25/2023 2100  Discharge to home or other facility with appropriate resources:   Identify barriers to discharge with patient and caregiver   Arrange for needed discharge resources and transportation as appropriate   Identify discharge learning needs (meds, wound care, etc)   Arrange for interpreters to assist at discharge as needed   Refer to discharge planning if patient needs post-hospital services based on physician order or complex needs related to functional status, cognitive ability or social support system  Taken 11/25/2023 1440  Discharge to home or other facility with appropriate resources:   Identify barriers to discharge with patient and caregiver   Arrange for needed discharge resources and transportation as appropriate   Identify discharge learning needs (meds, wound care, etc)   Arrange for interpreters to assist at discharge as needed   Refer to discharge planning if patient needs post-hospital services based on physician order or complex needs related to functional status, cognitive ability or social support system     Problem: Pain  Goal: Verbalizes/displays adequate comfort level or baseline comfort level  11/26/2023 1038 by Robyne Joen SAILOR, RN  Outcome: Progressing  11/25/2023 2350 by April Marquita SAILOR, RN  Outcome: Progressing     Problem: Safety - Adult  Goal: Free from fall injury  11/26/2023 1038 by Robyne Joen SAILOR, RN  Outcome: Progressing  Flowsheets (Taken 11/26/2023 0800)  Free From Fall Injury:   Based on caregiver fall risk screen, instruct family/caregiver to ask for assistance with  transferring infant if caregiver noted to have fall risk factors   Instruct family/caregiver on patient safety  11/25/2023 2350 by April Marquita SAILOR, RN  Outcome: Progressing     Problem: ABCDS Injury Assessment  Goal: Absence of physical injury  11/26/2023 1038 by Robyne Joen SAILOR, RN  Outcome: Progressing  Flowsheets (Taken 11/26/2023 0800)  Absence of Physical Injury: Implement safety measures based on patient assessment  11/25/2023 2350 by Hammett, Marquita SAILOR, RN  Outcome: Progressing     Problem: Metabolic/Fluid and Electrolytes - Adult  Goal: Electrolytes maintained within normal limits  Outcome: Progressing     Problem: Respiratory - Adult  Goal: Achieves optimal ventilation and oxygenation  Outcome: Not Progressing

## 2023-11-26 NOTE — Plan of Care (Signed)
 Problem: Discharge Planning  Goal: Discharge to home or other facility with appropriate resources  11/26/2023 2045 by Silva Barnie BRAVO, RN  Outcome: Progressing  Flowsheets (Taken 11/26/2023 1930)  Discharge to home or other facility with appropriate resources: Identify barriers to discharge with patient and caregiver  11/26/2023 2045 by Silva Barnie BRAVO, RN  Outcome: Progressing  Flowsheets (Taken 11/26/2023 1930)  Discharge to home or other facility with appropriate resources: Identify barriers to discharge with patient and caregiver  11/26/2023 1038 by Robyne Joen SAILOR, RN  Outcome: Progressing  Flowsheets (Taken 11/26/2023 0800)  Discharge to home or other facility with appropriate resources: Identify barriers to discharge with patient and caregiver     Problem: Pain  Goal: Verbalizes/displays adequate comfort level or baseline comfort level  11/26/2023 2045 by Silva Barnie BRAVO, RN  Outcome: Progressing  Flowsheets (Taken 11/26/2023 1930)  Verbalizes/displays adequate comfort level or baseline comfort level:   Encourage patient to monitor pain and request assistance   Assess pain using appropriate pain scale   Administer analgesics based on type and severity of pain and evaluate response   Implement non-pharmacological measures as appropriate and evaluate response  11/26/2023 2045 by Silva Barnie BRAVO, RN  Outcome: Progressing  Flowsheets (Taken 11/26/2023 1930)  Verbalizes/displays adequate comfort level or baseline comfort level:   Encourage patient to monitor pain and request assistance   Assess pain using appropriate pain scale   Administer analgesics based on type and severity of pain and evaluate response   Implement non-pharmacological measures as appropriate and evaluate response  11/26/2023 1038 by Robyne Joen SAILOR, RN  Outcome: Progressing     Problem: Safety - Adult  Goal: Free from fall injury  11/26/2023 2045 by Silva Barnie BRAVO, RN  Outcome: Progressing  11/26/2023 2045 by Silva Barnie BRAVO,  RN  Outcome: Progressing  11/26/2023 1038 by Robyne Joen SAILOR, RN  Outcome: Progressing  Flowsheets (Taken 11/26/2023 0800)  Free From Fall Injury:   Based on caregiver fall risk screen, instruct family/caregiver to ask for assistance with transferring infant if caregiver noted to have fall risk factors   Instruct family/caregiver on patient safety     Problem: ABCDS Injury Assessment  Goal: Absence of physical injury  11/26/2023 2045 by Silva Barnie BRAVO, RN  Outcome: Progressing  11/26/2023 2045 by Silva Barnie BRAVO, RN  Outcome: Progressing  11/26/2023 1038 by Robyne Joen SAILOR, RN  Outcome: Progressing  Flowsheets (Taken 11/26/2023 0800)  Absence of Physical Injury: Implement safety measures based on patient assessment     Problem: Respiratory - Adult  Goal: Achieves optimal ventilation and oxygenation  11/26/2023 2045 by Silva Barnie BRAVO, RN  Outcome: Progressing  Flowsheets (Taken 11/26/2023 1930)  Achieves optimal ventilation and oxygenation:   Assess for changes in respiratory status   Assess for changes in mentation and behavior   Position to facilitate oxygenation and minimize respiratory effort   Oxygen supplementation based on oxygen saturation or arterial blood gases  11/26/2023 2045 by Silva Barnie BRAVO, RN  Outcome: Progressing  Flowsheets (Taken 11/26/2023 1930)  Achieves optimal ventilation and oxygenation:   Assess for changes in respiratory status   Assess for changes in mentation and behavior   Position to facilitate oxygenation and minimize respiratory effort   Oxygen supplementation based on oxygen saturation or arterial blood gases  11/26/2023 1038 by Robyne Joen SAILOR, RN  Outcome: Not Progressing     Problem: Metabolic/Fluid and Electrolytes - Adult  Goal: Electrolytes maintained within normal limits  11/26/2023 2045  by Silva Barnie BRAVO, RN  Outcome: Progressing  Flowsheets (Taken 11/26/2023 1930)  Electrolytes maintained within normal limits: Monitor labs and assess patient for signs and  symptoms of electrolyte imbalances  11/26/2023 2045 by Silva Barnie BRAVO, RN  Outcome: Progressing  Flowsheets (Taken 11/26/2023 1930)  Electrolytes maintained within normal limits: Monitor labs and assess patient for signs and symptoms of electrolyte imbalances  11/26/2023 1038 by Robyne Joen SAILOR, RN  Outcome: Progressing  Goal: Hemodynamic stability and optimal renal function maintained  11/26/2023 2045 by Silva Barnie BRAVO, RN  Outcome: Progressing  11/26/2023 2045 by Silva Barnie BRAVO, RN  Outcome: Progressing  Goal: Glucose maintained within prescribed range  11/26/2023 2045 by Silva Barnie BRAVO, RN  Outcome: Progressing  11/26/2023 2045 by Silva Barnie BRAVO, RN  Outcome: Progressing     Problem: Skin/Tissue Integrity  Goal: Skin integrity remains intact  Description: 1.  Monitor for areas of redness and/or skin breakdown  2.  Assess vascular access sites hourly  3.  Every 4-6 hours minimum:  Change oxygen saturation probe site  4.  Every 4-6 hours:  If on nasal continuous positive airway pressure, respiratory therapy assess nares and determine need for appliance change or resting period  11/26/2023 2045 by Silva Barnie BRAVO, RN  Outcome: Progressing  11/26/2023 2045 by Silva Barnie BRAVO, RN  Outcome: Progressing     Problem: Neurosensory - Adult  Goal: Achieves stable or improved neurological status  11/26/2023 2045 by Silva Barnie BRAVO, RN  Outcome: Progressing  11/26/2023 2045 by Silva Barnie BRAVO, RN  Outcome: Progressing  Goal: Achieves maximal functionality and self care  11/26/2023 2045 by Silva Barnie BRAVO, RN  Outcome: Progressing  11/26/2023 2045 by Silva Barnie BRAVO, RN  Outcome: Progressing     Problem: Cardiovascular - Adult  Goal: Absence of cardiac dysrhythmias or at baseline  11/26/2023 2045 by Silva Barnie BRAVO, RN  Outcome: Progressing  11/26/2023 2045 by Silva Barnie BRAVO, RN  Outcome: Progressing     Problem: Skin/Tissue Integrity - Adult  Goal: Skin integrity remains  intact  Description: 1.  Monitor for areas of redness and/or skin breakdown  2.  Assess vascular access sites hourly  3.  Every 4-6 hours minimum:  Change oxygen saturation probe site  4.  Every 4-6 hours:  If on nasal continuous positive airway pressure, respiratory therapy assess nares and determine need for appliance change or resting period  11/26/2023 2045 by Silva Barnie BRAVO, RN  Outcome: Progressing  11/26/2023 2045 by Silva Barnie BRAVO, RN  Outcome: Progressing  Goal: Oral mucous membranes remain intact  11/26/2023 2045 by Silva Barnie BRAVO, RN  Outcome: Progressing  11/26/2023 2045 by Silva Barnie BRAVO, RN  Outcome: Progressing     Problem: Musculoskeletal - Adult  Goal: Return mobility to safest level of function  11/26/2023 2045 by Silva Barnie BRAVO, RN  Outcome: Progressing  11/26/2023 2045 by Silva Barnie BRAVO, RN  Outcome: Progressing  Goal: Return ADL status to a safe level of function  11/26/2023 2045 by Silva Barnie BRAVO, RN  Outcome: Progressing  11/26/2023 2045 by Silva Barnie BRAVO, RN  Outcome: Progressing     Problem: Gastrointestinal - Adult  Goal: Minimal or absence of nausea and vomiting  11/26/2023 2045 by Silva Barnie BRAVO, RN  Outcome: Progressing  11/26/2023 2045 by Silva Barnie BRAVO, RN  Outcome: Progressing  Goal: Maintains or returns to baseline bowel function  11/26/2023 2045 by Silva Barnie BRAVO, RN  Outcome: Progressing  11/26/2023 2045  by Silva Barnie BRAVO, RN  Outcome: Progressing  Goal: Maintains adequate nutritional intake  11/26/2023 2045 by Silva Barnie BRAVO, RN  Outcome: Progressing  11/26/2023 2045 by Silva Barnie BRAVO, RN  Outcome: Progressing     Problem: Genitourinary - Adult  Goal: Urinary catheter remains patent  11/26/2023 2045 by Silva Barnie BRAVO, RN  Outcome: Progressing  11/26/2023 2045 by Silva Barnie BRAVO, RN  Outcome: Progressing     Problem: Infection - Adult  Goal: Absence of infection at discharge  11/26/2023 2045 by Silva Barnie BRAVO,  RN  Outcome: Progressing  11/26/2023 2045 by Silva Barnie BRAVO, RN  Outcome: Progressing  Goal: Absence of infection during hospitalization  11/26/2023 2045 by Silva Barnie BRAVO, RN  Outcome: Progressing  11/26/2023 2045 by Silva Barnie BRAVO, RN  Outcome: Progressing     Problem: Hematologic - Adult  Goal: Maintains hematologic stability  11/26/2023 2045 by Silva Barnie BRAVO, RN  Outcome: Progressing  11/26/2023 2045 by Silva Barnie BRAVO, RN  Outcome: Progressing     Problem: Anxiety  Goal: Will report anxiety at manageable levels  Description: INTERVENTIONS:  1. Administer medication as ordered  2. Teach and rehearse alternative coping skills  3. Provide emotional support with 1:1 interaction with staff  11/26/2023 2045 by Silva Barnie BRAVO, RN  Outcome: Progressing  11/26/2023 2045 by Silva Barnie BRAVO, RN  Outcome: Progressing     Problem: Coping  Goal: Pt/Family able to verbalize concerns and demonstrate effective coping strategies  Description: INTERVENTIONS:  1. Assist patient/family to identify coping skills, available support systems and cultural and spiritual values  2. Provide emotional support, including active listening and acknowledgement of concerns of patient and caregivers  3. Reduce environmental stimuli, as able  4. Instruct patient/family in relaxation techniques, as appropriate  5. Assess for spiritual pain/suffering and initiate Spiritual Care, Psychosocial Clinical Specialist consults as needed  11/26/2023 2045 by Silva Barnie BRAVO, RN  Outcome: Progressing  11/26/2023 2045 by Silva Barnie BRAVO, RN  Outcome: Progressing     Problem: Decision Making  Goal: Pt/Family able to effectively weigh alternatives and participate in decision making related to treatment and care  Description: INTERVENTIONS:  1. Determine when there are differences between patient's view, family's view, and healthcare provider's view of condition  2. Facilitate patient and family articulation of goals for care  3.  Help patient and family identify pros/cons of alternative solutions  4. Provide information as requested by patient/family  5. Respect patient/family right to receive or not to receive information  6. Serve as a Print production planner between patient and family and health care team  7. Initiate Consults from Ethics, Palliative Care or initiate Family Care Conference as is appropriate  11/26/2023 2045 by Silva Barnie BRAVO, RN  Outcome: Progressing  11/26/2023 2045 by Silva Barnie BRAVO, RN  Outcome: Progressing     Problem: Respiratory - Adult  Goal: Achieves optimal ventilation and oxygenation  11/26/2023 2045 by Silva Barnie BRAVO, RN  Outcome: Progressing  Flowsheets (Taken 11/26/2023 1930)  Achieves optimal ventilation and oxygenation:   Assess for changes in respiratory status   Assess for changes in mentation and behavior   Position to facilitate oxygenation and minimize respiratory effort   Oxygen supplementation based on oxygen saturation or arterial blood gases  11/26/2023 2045 by Silva Barnie BRAVO, RN  Outcome: Progressing  Flowsheets (Taken 11/26/2023 1930)  Achieves optimal ventilation and oxygenation:   Assess for changes in respiratory status   Assess for changes in mentation and behavior  Position to facilitate oxygenation and minimize respiratory effort   Oxygen supplementation based on oxygen saturation or arterial blood gases  11/26/2023 1038 by Robyne Joen SAILOR, RN  Outcome: Not Progressing

## 2023-11-26 NOTE — Progress Notes (Signed)
 Spiritual Health Progress Note  Sandra Harmon      Room # 625/98    Name: Sandra Harmon           Age: 68 y.o.    Gender: female          MRN: [id number]  Religion: Christian       Preferred Language: English      Date: 11/26/23  Visit Time: Begin Time: 0830 End Time : 0840         Visit Summary: Participated in IDT Rounds with staff.  Patient is listed as [place] in [place].  Will follow as needed.    Encounter Overview/Reason: Interdisciplinary rounds  Encounter Code:     Crisis (if applicable):     Service Provided For: Patient     Patient was not available. Patient was working with other staff/was asleep/ indisposed. Chaplain will follow-up at a later time.    Faith, Belief, Meaning:   Patient identifies as spiritual  Family/Friends No family/friends present  Rituals (if applicable)      Importance and Influence:  Patient unable to assess at this time  Family/Friends No family/friends present    Community:  Patient   No family/ friends present.  Family/Friends   No family/ friends present.    Assessment and Plan of Care:   Emotions Expressed by Patient:        Interventions by Chaplain:         Result/ Response by Patient:        Patient Plan of Care:         Emotions Expressed by Spouse/Family/Friends:   No family/ friends present when chaplain visited.    Chaplain Interventions with Spouse/ Family/Friends include:   No family present    Spouse/Family/Friends Plan of Care:   Spiritual care available upon referral.      Electronically signed by Elia Dorthea Curry, [facility] on 11/26/2023 at 2:46 PM  Spiritual Health   [address] [place]  [phone] or [phone]

## 2023-11-26 NOTE — Progress Notes (Signed)
 Hospitalist Progress Note   Admit Date:  11/25/2023  9:54 AM   Name:  Sandra Harmon   Age:  68 y.o.  Sex:  female  DOB:  Sep 19, 1955   MRN:  [id number]   Room:  374/01    Presenting/Chief Complaint: Chest Pain and Shortness of Breath     Reason(s) for Admission: Hypokalemia [E87.6]  Acute respiratory failure with hypoxia (HCC) [J96.01]  Pneumonia of both lungs due to infectious organism, unspecified part of lung [J18.9]  Congestive heart failure, unspecified HF chronicity, unspecified heart failure type (HCC) [I50.9]  Community acquired pneumonia, bilateral [J18.9]     Hospital Course:   Sandra Harmon is a 68 y.o. female with medical history of depression, hyperlipidemia, hypothyroidism, osteoporosis, chronic back pain, chronic fatigue, left kidney mass/cyst, and obesity who comes to the emergency room due to chest pain and shortness of breath that started yesterday, 9/29.  She works at a daycare and her son reports that she was also very lethargic today.  In the emergency room she was tachypneic and tachycardic.       Temperature is 97.8, respirations 38, pulse 111, blood pressure 174/55, oxygen saturation 86% on room air.  She was placed on heated high flow at 40 L with 60% FiO2.     Labs were obtained and show sodium 132, potassium 2.9, chloride 95, lactic acid of 3.4, Pro-Cal of 1.9, ABG was obtained while on CPAP/BiPAP this morning and showed pH 7.52, pCO2 26.1, pO2 52, Pa O2 sat 90.6, HCO3 21.4.  Total bilirubin was elevated at 3.4 as well as AST of 67 CBC was obtained and showed a white count of 11.5.  Blood cultures were drawn.  COVID was negative.  UA was negative for infection.  A chest x-ray was obtained and showed diffuse bilateral consolidation which may be caused by pulmonary edema or pneumonia.     She was given doxycycline and Rocephin IV in the emergency room as well as potassium and 2 L of normal saline.  Unfortunately she began to get even more tachypneic and work of breathing  increased.  Nurse in the emergency room messaged me to continue to give me updates and as she worsened I asked the ICU Rover, Jenkins PEAK, to go down and see her.  She continued to be tachypneic and have increased work of breathing.  We went to the bedside together and ABG was obtained again as well as a dose of IV Lasix.  Foley was placed and she immediately had a significant amount of urine output.  Repeat ABG showed pH of 7.49, pCO2 of 29, PaO2 of 51, PaO2 sat of 87.7, and HCO3 of 21.8.     She was able to speak with me during this time though she was quite tachypneic.  She was able to answer all my questions and we spoke about her plan of care.  Her son was also in the room and I spoke with him about her plan of care.  We will now transfer her to ICU for closer monitoring.  Did state that if she needs to be on BiPAP or intubated this is something that she would want to do if needed.           Subjective & 24hr Events:   Sandra Harmon is seen this morning in her room. She still looks quite fatigued, but she does look somewhat improved. She reports that she may feel a bit better. Still hypoxic and requiring oxygen and we  are monitoring this very closely. Blood pressure also a bit soft, but lactic acid has stabilized and MAP >65. CXR ordered and reviewed by myself still looks congested and shows consolidation. Will give a dose of lasix again this morning. Will also start steroids to see if this will be helpful with her bilateral PNA. Pulm also will be consulted.        Patient is critically ill.  Without these interventions, there is a high probability of imminent acute organ impairment or life-threatening deterioration.  Total critical care time spent: 35 minutes.    Critical care time includes time spent at bedside performing history and exam, performing chart review, discussing findings and treatment plan with patient and/or family, discussing patient with consultants and colleagues, ordering and reviewing pertinent  laboratory and radiographic evaluations, and discussing patient with nursing staff. Time excludes procedures.    Assessment & Plan:   Principal Problem:    Severe sepsis (HCC)  --Meets with tachypnea, tachycardia, lactic acidosis, leukocytosis, and bilateral pneumonia  --Given IV Rocephin and doxycycline which has been continued  - Was initially given 2 L of normal saline but felt that this contributed to her shortness of breath and was given Lasix in the ED. Will give another dose of Lasix.  -Lactic acidosis normalized        Active Problems:    Community acquired pneumonia, bilateral    Acute hypoxic respiratory failure (HCC)  --Continue IV Rocephin and doxycycline at this time  - Start steroids 10/1  -- Repeat CXR, final report pending  - DuoNebs  --Continue supplemental oxygen at this time also would be okay with BiPAP or intubation if needed, hopefully we can avoid this  - Consult Pulm, appreciate recs        High Anion Gap Metabolic Acidosis, closed  Respiratory Alkalosis  -- Anion gap 15 on admission, ABG shows decreased CO2  - Now gap closed at 12  -- Labs daily          Hypokalemia  -Replace per protocol  --Check Daily          Lactic acidosis, resolved    Leukocytosis  -- Lactic acidosis is already improving but we will continue to monitor this  - CBC daily        Depression  -Continue Zoloft         Hyperlipidemia  -Continue pravastatin         Hypothyroidism  -Continue Synthroid   --TSH in August normal        GERD  - Oral PPI changed to IV at this time       Anticipated Discharge Arrangements:   Therapy has not evaluated yet    PT/OT evals ordered?  defer  Diet:  ADULT DIET; Regular  VTE prophylaxis: Lovenox  Code status: Full Code      Non-peripheral Lines and Tubes (if present):      Urinary Catheter 11/25/23 Foley (Active)        Telemetry (if present):  Cardiac/Telemetry Monitor On: Bedside monitor in use        Hospital Problems:  Principal Problem:    Severe sepsis (HCC)  Active Problems:     Community acquired pneumonia, bilateral    Acute hypoxic respiratory failure (HCC)    Hypokalemia    Lactic acidosis  Resolved Problems:    * No resolved hospital problems. *      Objective:   Patient Vitals for the past 24 hrs:   Temp Pulse Resp BP SpO2  11/26/23 1100 97.3 F (36.3 C) 81 17 (!) 95/54 95 %   11/26/23 1051 -- -- 19 -- --   11/26/23 1021 -- 89 24 -- 93 %   11/26/23 1018 -- 91 26 107/65 94 %   11/26/23 1000 -- 92 28 (!) 83/59 92 %   11/26/23 0900 -- 90 21 111/69 93 %   11/26/23 0803 -- 86 (!) 36 -- 94 %   11/26/23 0800 -- 86 20 103/72 95 %   11/26/23 0720 98.1 F (36.7 C) 88 22 110/67 94 %   11/26/23 0600 -- 88 19 117/69 --   11/26/23 0500 -- 87 18 112/63 95 %   11/26/23 0400 -- 83 17 114/68 --   11/26/23 0300 98.3 F (36.8 C) 85 20 111/68 95 %   11/26/23 0200 98.3 F (36.8 C) 79 15 112/69 96 %   11/26/23 0152 -- 78 15 -- 95 %   11/26/23 0059 -- 80 16 97/61 94 %   11/26/23 0029 -- 81 17 (!) 100/57 95 %   11/26/23 0004 -- 80 16 (!) 98/59 93 %   11/25/23 2329 99.9 F (37.7 C) 83 17 (!) 98/51 93 %   11/25/23 2311 -- 88 19 101/63 94 %   11/25/23 2304 -- 84 16 (!) 88/47 94 %   11/25/23 2233 -- 86 16 -- 94 %   11/25/23 2207 -- 93 -- -- --   11/25/23 2203 -- -- 28 -- --   11/25/23 2200 -- 98 29 (!) 94/50 93 %   11/25/23 2150 -- 96 29 -- 95 %   11/25/23 2134 -- 96 30 -- 94 %   11/25/23 2100 -- 99 28 (!) 102/55 94 %   11/25/23 2044 99.9 F (37.7 C) (!) 102 (!) 35 (!) 116/59 95 %   11/25/23 2004 -- 99 29 (!) 114/56 93 %   11/25/23 1940 -- (!) 101 25 116/62 92 %   11/25/23 1930 -- (!) 102 28 113/63 92 %   11/25/23 1926 -- (!) 104 28 -- --   11/25/23 1921 -- (!) 101 30 -- --   11/25/23 1900 -- (!) 102 28 (!) 118/56 90 %   11/25/23 1845 -- 100 (!) 32 (!) 104/54 90 %   11/25/23 1830 -- 100 (!) 31 (!) 112/57 90 %   11/25/23 1815 -- (!) 103 14 (!) 116/52 (!) 88 %   11/25/23 1800 -- (!) 104 23 122/65 90 %   11/25/23 1740 -- (!) 102 29 (!) 110/56 91 %   11/25/23 1730 -- (!) 102 30 (!) 112/52 91 %   11/25/23 1615  -- (!) 109 30 -- 92 %   11/25/23 1330 -- (!) 107 -- (!) 142/89 --       Oxygen Therapy  SpO2: 95 %  Pulse Oximeter Device Mode: Continuous  Pulse via Oximetry: 81 beats per minute  Pulse Oximeter Device Location: Left, Finger  O2 Device: Heated high flow cannula (45L 80%)  O2 Flow Rate (L/min): 45 L/min  FiO2 : 80 %  Blood Gas  Performed?: Yes  Allen's Test #1: Collateral flow confirmed  Site #1: Right Radial  Site Prepped #1: Yes  Number of Attempts #1: 1  Pressure Held #1: Yes  Complications #1: None  Post-procedure #1: Standard  Specimen Status #1: Point of care  How Tolerated?: Tolerated well    Estimated body mass index is 34.94 kg/m as calculated from the following:    Height  as of this encounter: 1.524 m (5').    Weight as of this encounter: 81.1 kg (178 lb 14.4 oz).    Intake/Output Summary (Last 24 hours) at 11/26/2023 1319  Last data filed at 11/26/2023 0800  Gross per 24 hour   Intake 413.87 ml   Output 4450 ml   Net -4036.13 ml         Physical Exam:   General:          Alert and oriented x 3, still looks fatigued, but coloring is better and she states she feels a bit better  Head:               Normocephalic, atraumatic  Eyes:               Sclerae appear normal.  Pupils equally round.  ENT:                Nares appear normal.  Moist oral mucosa. HHNC in place 80% FiO2/45L  Neck:               No restricted ROM.  Trachea midline   CV:                  RRR.  No jugular venous distension.  Lungs:             Coarse breath sounds, diminished at the bases.  No wheezing.  Symmetric expansion.  Abdomen:        Soft, nontender, nondistended.  Obese  Extremities:     No cyanosis or clubbing.  No edema  Skin:                No rashes.  Normal coloration.   Warm and dry.    Neuro:             CN II-XII grossly intact.  Sensation intact.   Psych:             Mood and affect appropriate      I have personally reviewed labs and tests:  Recent Labs:  Recent Results (from the past 48 hours)   EKG 12 Lead (Chest Pain)     Collection Time: 11/25/23 10:00 AM   Result Value Ref Range    Ventricular Rate 114 BPM    Atrial Rate 113 BPM    P-R Interval 133 ms    QRS Duration 103 ms    Q-T Interval 481 ms    QTc Calculation (Bazett) 663 ms    P Axis 67 degrees    R Axis 49 degrees    T Axis 59 degrees    Diagnosis       Sinus tachycardia  LAE, consider biatrial enlargement  Non-specific ST-t wave changes      Confirmed by [facility]  MD (UC), MATTHEW G ([id number]) on 11/25/2023 11:02:30 AM     Arterial Blood Gas, POC    Collection Time: 11/25/23 10:41 AM   Result Value Ref Range    DEVICE CPAP      POC pH 7.52 (H) 7.35 - 7.45      POC pCO2 26.1 (L) 35 - 45 MMHG    POC PO2 52 (L) 75 - 100 MMHG    POC HCO3 21.4 21 - 28 MMOL/L    POC O2 SAT 90.6 (L) 94 - 98 %    Base Excess 0.0 mmol/L    Mode Non Invasive      POC Allen's Test Positive  Site RIGHT RADIAL      Specimen type: ARTERIAL      Performed by: ReedJacquelineRT     Critical Value Read Back RNSEAN    Brain Natriuretic Peptide    Collection Time: 11/25/23 11:01 AM   Result Value Ref Range    NT Pro-BNP 145 (H) 0 - 125 PG/ML   COVID-19 & Influenza Combo    Collection Time: 11/25/23 11:01 AM    Specimen: Swab   Result Value Ref Range    Source NASAL      SARS-CoV-2, Rapid Not detected NOTD      Influenza A, NAA Not detected NOTD      Influenza B, NAA Not detected NOTD     Urinalysis    Collection Time: 11/25/23 11:01 AM   Result Value Ref Range    Color, UA DARK YELLOW      Appearance CLEAR      Specific Gravity, UA 1.015 1.001 - 1.023      pH, Urine 6.0 5.0 - 9.0      Protein, UA TRACE (A) NEG mg/dL    Glucose, Ur Negative NEG mg/dL    Ketones, Urine 15 (A) NEG mg/dL    Bilirubin, Urine SMALL (A) NEG      Blood, Urine Negative NEG      Urobilinogen, Urine >8.0 (H) 0.2 - 1.0 EU/dL    Nitrite, Urine Negative NEG      Leukocyte Esterase, Urine Negative NEG      WBC, UA 0-4 U4 /hpf    RBC, UA 0-5 U5 /hpf    Epithelial Cells, UA 5-10 (A) U5 /hpf    BACTERIA, URINE Negative NEG /hpf    Hyaline  Casts, UA 0-2 /lpf   Troponin    Collection Time: 11/25/23 11:01 AM   Result Value Ref Range    Troponin T <6.0 0 - 14 ng/L   Lactate, Sepsis    Collection Time: 11/25/23 11:01 AM   Result Value Ref Range    Lactic Acid, Sepsis 3.4 (H) 0.5 - 2.0 MMOL/L   Procalcitonin    Collection Time: 11/25/23 11:01 AM   Result Value Ref Range    Procalcitonin 1.90 (H) 0.00 - 0.10 ng/mL   Blood Culture 1    Collection Time: 11/25/23 11:01 AM    Specimen: Blood   Result Value Ref Range    Special Requests RIGHT  Antecubital        Culture NO GROWTH AFTER 20 HOURS     CBC with Auto Differential    Collection Time: 11/25/23 11:02 AM   Result Value Ref Range    WBC 11.5 (H) 4.3 - 11.1 K/uL    RBC 4.51 4.05 - 5.2 M/uL    Hemoglobin 14.2 11.7 - 15.4 g/dL    Hematocrit 58.3 64.1 - 46.3 %    MCV 92.2 82.0 - 102.0 FL    MCH 31.5 26.1 - 32.9 PG    MCHC 34.1 31.4 - 35.0 g/dL    RDW 86.4 88.0 - 85.3 %    Platelets 156 150 - 450 K/uL    MPV 9.9 9.4 - 12.3 FL    nRBC 0.06 0.0 - 0.2 K/uL    Neutrophils % 61 47 - 75 %    Lymphocytes 17 16 - 44 %    Monocytes % 5 3 - 9 %    Metamyelocytes 5 %    Myelocytes 10 %    Promyelocytes 2 %    Neutrophils Absolute  8.96 (H) 1.7 - 8.2 K/UL    Lymphocytes Absolute 1.96 0.5 - 4.6 K/UL    Monocytes Absolute 0.58 0.1 - 1.3 K/UL    RBC Comment SLIGHT  POLYCHROMASIA        RBC Comment SLIGHT  ANISOCYTOSIS        WBC Comment SLIGHT      Platelet Comment ADEQUATE      Differential Type MANUAL     CMP    Collection Time: 11/25/23 11:02 AM   Result Value Ref Range    Sodium 132 (L) 136 - 145 mmol/L    Potassium 2.9 (L) 3.5 - 5.1 mmol/L    Chloride 95 (L) 98 - 107 mmol/L    CO2 22 20 - 29 mmol/L    Anion Gap 15 7 - 16 mmol/L    Glucose 122 (H) 70 - 99 mg/dL    BUN 12 8 - 23 MG/DL    Creatinine 9.25 9.39 - 1.10 MG/DL    Est, Glom Filt Rate 89 >60 ml/min/1.53m2    Calcium 8.6 (L) 8.8 - 10.2 MG/DL    Total Bilirubin 3.4 (H) 0.0 - 1.2 MG/DL    ALT 35 8 - 45 U/L    AST 67 (H) 15 - 37 U/L    Alk Phosphatase 101 35 - 104 U/L     Total Protein 5.9 (L) 6.3 - 8.2 g/dL    Albumin 2.5 (L) 3.2 - 4.6 g/dL    Globulin 3.4 2.3 - 3.5 g/dL    Albumin/Globulin Ratio 0.7 (L) 1.0 - 1.9     Magnesium    Collection Time: 11/25/23 11:02 AM   Result Value Ref Range    Magnesium 1.9 1.8 - 2.4 mg/dL   Blood Culture 2    Collection Time: 11/25/23 11:13 AM    Specimen: Blood   Result Value Ref Range    Special Requests LEFT  Antecubital        Culture NO GROWTH AFTER 19 HOURS     Lactate, Sepsis    Collection Time: 11/25/23  3:36 PM   Result Value Ref Range    Lactic Acid, Sepsis 2.2 (H) 0.5 - 2.0 MMOL/L   Arterial Blood Gas, POC    Collection Time: 11/25/23  4:14 PM   Result Value Ref Range    DEVICE High Frequency Oscillator Ventilator      FIO2 60 %    POC pH 7.49 (H) 7.35 - 7.45      POC pCO2 29.0 (L) 35 - 45 MMHG    POC PO2 51 (L) 75 - 100 MMHG    POC HCO3 21.8 21 - 28 MMOL/L    POC O2 SAT 87.7 (L) 94 - 98 %    Base Deficit (POC) 0.3 mmol/L    POC Allen's Test Positive      Site RIGHT RADIAL      Pt Temp 100      Specimen type: ARTERIAL      Performed by: ReedJacquelineRT     Critical Value Read Back [email or web address]    Lactic Acid    Collection Time: 11/25/23  9:13 PM   Result Value Ref Range    Lactic Acid 2.9 (H) 0.5 - 2.0 mmol/L   Troponin    Collection Time: 11/25/23  9:15 PM   Result Value Ref Range    Troponin T 7.0 0 - 14 ng/L   Respiratory Panel, Molecular, with COVID-19 (Restricted: peds pts or suitable admitted adults)  Collection Time: 11/25/23  9:15 PM    Specimen: Nasopharynx   Result Value Ref Range    Adenovirus by PCR NOT DETECTED NOTDET      Coronavirus 229E by PCR NOT DETECTED NOTDET      Coronavirus HKU1 by PCR NOT DETECTED NOTDET      Coronavirus NL63 by PCR NOT DETECTED NOTDET      Coronavirus OC43 by PCR NOT DETECTED NOTDET      SARS-CoV-2, PCR NOT DETECTED NOTDET      Human Metapneumovirus by PCR NOT DETECTED NOTDET      Rhinovirus Enterovirus PCR NOT DETECTED NOTDET      Influenza A by PCR NOT DETECTED NOTDET      Influenza B PCR NOT  DETECTED NOTDET      Parainfluenza 1 PCR NOT DETECTED NOTDET      Parainfluenza 2 PCR NOT DETECTED NOTDET      Parainfluenza 3 PCR NOT DETECTED NOTDET      Parainfluenza 4 PCR NOT DETECTED NOTDET      Respiratory Syncytial Virus by PCR NOT DETECTED NOTDET      Bordetella parapertussis by PCR NOT DETECTED NOTDET      Bordetella pertussis by PCR NOT DETECTED NOTDET      Chlamydophila Pneumonia PCR NOT DETECTED NOTDET      Mycoplasma pneumo by PCR NOT DETECTED NOTDET     Lactic Acid    Collection Time: 11/26/23  3:06 AM   Result Value Ref Range    Lactic Acid 2.4 (H) 0.5 - 2.0 mmol/L   CBC with Auto Differential    Collection Time: 11/26/23  3:06 AM   Result Value Ref Range    WBC 17.6 (H) 4.3 - 11.1 K/uL    RBC 4.46 4.05 - 5.2 M/uL    Hemoglobin 14.0 11.7 - 15.4 g/dL    Hematocrit 56.2 64.1 - 46.3 %    MCV 98.0 82.0 - 102.0 FL    MCH 31.4 26.1 - 32.9 PG    MCHC 32.0 31.4 - 35.0 g/dL    RDW 85.8 88.0 - 85.3 %    Platelets 120 (L) 150 - 450 K/uL    MPV 10.1 9.4 - 12.3 FL    nRBC 0.13 0.0 - 0.2 K/uL    Neutrophils % 69.7 43.0 - 78.0 %    Lymphocytes % 10.6 (L) 13.0 - 44.0 %    Monocytes % 4.2 4.0 - 12.0 %    Eosinophils % 0.7 0.5 - 7.8 %    Basophils % 5.7 (H) 0.0 - 2.0 %    Immature Granulocytes % 9.1 (H) 0.0 - 5.0 %    Neutrophils Absolute 12.27 (H) 1.70 - 8.20 K/UL    Lymphocytes Absolute 1.87 0.50 - 4.60 K/UL    Monocytes Absolute 0.74 0.10 - 1.30 K/UL    Eosinophils Absolute 0.12 0.00 - 0.80 K/UL    Basophils Absolute 1.00 (H) 0.00 - 0.20 K/UL    Immature Granulocytes Absolute 1.60 (H) 0.0 - 0.5 K/UL    RBC Comment SLIGHT  POLYCHROMASIA        WBC Comment Result Confirmed By Smear      Platelet Comment PLATELET AGGREGATES PRESENT      Differential Type AUTOMATED     Basic Metabolic Panel w/ Reflex to MG    Collection Time: 11/26/23  3:06 AM   Result Value Ref Range    Sodium 137 136 - 145 mmol/L    Potassium 2.8 (L) 3.5 - 5.1  mmol/L    Chloride 102 98 - 107 mmol/L    CO2 23 20 - 29 mmol/L    Anion Gap 12 7 - 16  mmol/L    Glucose 118 (H) 70 - 99 mg/dL    BUN 14 8 - 23 MG/DL    Creatinine 9.30 9.39 - 1.10 MG/DL    Est, Glom Filt Rate >90 >60 ml/min/1.43m2    Calcium 8.2 (L) 8.8 - 10.2 MG/DL   Magnesium    Collection Time: 11/26/23  3:06 AM   Result Value Ref Range    Magnesium 1.9 1.8 - 2.4 mg/dL   Lactic Acid    Collection Time: 11/26/23  6:45 AM   Result Value Ref Range    Lactic Acid 1.7 0.5 - 2.0 mmol/L   Lactic Acid    Collection Time: 11/26/23 10:58 AM   Result Value Ref Range    Lactic Acid 2.5 (H) 0.5 - 2.0 mmol/L       Recent Labs     11/25/23  2115   COVID19 NOT DETECTED       Current Meds:  Current Facility-Administered Medications   Medication Dose Route Frequency    medicated lip ointment (BLISTEX)   Topical PRN    furosemide (LASIX) injection 40 mg  40 mg IntraVENous Once    methylPREDNISolone  sodium succ (SOLU-MEDROL ) 40 mg in sterile water 1 mL injection  40 mg IntraVENous Q12H    levothyroxine  (SYNTHROID ) tablet 75 mcg  75 mcg Oral QAM AC    sodium chloride flush 0.9 % injection 5-40 mL  5-40 mL IntraVENous 2 times per day    sodium chloride flush 0.9 % injection 5-40 mL  5-40 mL IntraVENous PRN    0.9 % sodium chloride infusion   IntraVENous PRN    potassium chloride (KLOR-CON M) extended release tablet 40 mEq  40 mEq Oral PRN    Or    potassium bicarb-citric acid (EFFER-K) effervescent tablet 40 mEq  40 mEq Oral PRN    Or    potassium chloride 10 mEq/100 mL IVPB (Peripheral Line)  10 mEq IntraVENous PRN    magnesium sulfate 2000 mg in 50 mL IVPB premix  2,000 mg IntraVENous PRN    ondansetron (ZOFRAN-ODT) disintegrating tablet 4 mg  4 mg Oral Q8H PRN    Or    ondansetron (ZOFRAN) injection 4 mg  4 mg IntraVENous Q6H PRN    polyethylene glycol (GLYCOLAX) packet 17 g  17 g Oral Daily PRN    cefTRIAXone (ROCEPHIN) 1,000 mg in sterile water 10 mL IV syringe  1,000 mg IntraVENous Q24H    doxycycline (VIBRAMYCIN) 100 mg in sodium chloride 0.9 % 100 mL IVPB (addEASE)  100 mg IntraVENous Q12H    pantoprazole  (PROTONIX) 40 mg in sodium chloride (PF) 0.9 % 10 mL injection  40 mg IntraVENous Daily    ipratropium 0.5 mg-albuterol  2.5 mg (DUONEB) nebulizer solution 1 Dose  1 Dose Inhalation Q4H PRN    enoxaparin (LOVENOX) injection 40 mg  40 mg SubCUTAneous Daily    oxyCODONE-acetaminophen  (PERCOCET) 5-325 MG per tablet 1 tablet  1 tablet Oral Q6H PRN       Signed:  Mitzie [place], DO    Part of this note may have been written by using a voice dictation software.  The note has been proof read but may still contain some grammatical/other typographical errors.

## 2023-11-27 ENCOUNTER — Inpatient Hospital Stay: Admit: 2023-11-27 | Payer: Medicare (Managed Care)

## 2023-11-27 ENCOUNTER — Inpatient Hospital Stay
Admission: EM | Admit: 2023-11-27 | Discharge: 2023-12-27 | Disposition: E | Payer: Medicare (Managed Care) | Source: Other Acute Inpatient Hospital | Attending: Pulmonary Disease | Admitting: Pulmonary Disease

## 2023-11-27 ENCOUNTER — Inpatient Hospital Stay: Payer: Medicare (Managed Care)

## 2023-11-27 DIAGNOSIS — A419 Sepsis, unspecified organism: Secondary | ICD-10-CM

## 2023-11-27 DIAGNOSIS — J9601 Acute respiratory failure with hypoxia: Secondary | ICD-10-CM

## 2023-11-27 LAB — MRSA/STAPH AUREUS DNA
MRSA by PCR: NOT DETECTED
SA by PCR: DETECTED — AB

## 2023-11-27 LAB — ECHO (TTE) COMPLETE (PRN CONTRAST/BUBBLE/STRAIN/3D)
AV Peak Gradient: 12 mmHg
AV Peak Velocity: 1.7 m/s
AV Velocity Ratio: 0.94
Ao Root Index: 1.46 cm/m2
Aortic Root: 2.6 cm
Aortic Sinus Valsalva Index: 1.57 cm/m2
Aortic Sinus Valsalva: 2.8 cm
Ascending Aorta Index: 1.69 cm/m2
Ascending Aorta: 3 cm
Body Surface Area: 1.85 m2
E/E' Lateral: 8.53
E/E' Ratio (Averaged): 10.35
E/E' Septal: 12.17
EF BP: 64 % (ref 55–100)
EF Physician: 65 %
Fractional Shortening 2D: 37 % (ref 28–44)
IVSd: 1 cm — AB (ref 0.6–0.9)
LA Area 2C: 10.4 cm2
LA Area 4C: 15.6 cm2
LA Diameter: 2.8 cm
LA Major Axis: 4.8 cm
LA Minor Axis: 4.1 cm
LA Size Index: 1.57 cm/m2
LA Volume BP: 30 mL (ref 22–52)
LA Volume Index BP: 17 mL/m2 (ref 16–34)
LA Volume Index MOD A2C: 12 mL/m2 — AB (ref 16–34)
LA Volume Index MOD A4C: 21 mL/m2 (ref 16–34)
LA Volume MOD A2C: 22 mL (ref 22–52)
LA Volume MOD A4C: 37 mL (ref 22–52)
LA/AO Root Ratio: 1.08
LV E' Lateral Velocity: 10.9 cm/s
LV E' Septal Velocity: 7.64 cm/s
LV EDV A2C: 59 mL
LV EDV A4C: 46 mL
LV EDV Index A2C: 33 mL/m2
LV EDV Index A4C: 26 mL/m2
LV ESV A2C: 21 mL
LV ESV A4C: 17 mL
LV ESV Index A2C: 12 mL/m2
LV ESV Index A4C: 10 mL/m2
LV Ejection Fraction A2C: 65 %
LV Ejection Fraction A4C: 63 %
LV Mass 2D Index: 83.2 g/m2 (ref 43–95)
LV Mass 2D: 148.1 g (ref 67–162)
LV RWT Ratio: 0.39
LVIDd Index: 2.58 cm/m2
LVIDd: 4.6 cm (ref 3.9–5.3)
LVIDs Index: 1.63 cm/m2
LVIDs: 2.9 cm
LVOT Peak Gradient: 10 mmHg
LVOT Peak Velocity: 1.6 m/s
LVPWd: 0.9 cm (ref 0.6–0.9)
MV A Velocity: 0.93 m/s
MV E Velocity: 0.93 m/s
MV E Wave Deceleration Time: 187 ms
MV E/A: 1
PR Max Velocity: 0.9 m/s
Pulm Vein Peak D Velocity: 0.8 m/s
Pulm Vein Peak S Velocity: 0.6 m/s
Pulm Vein S/D: 0.8 no units
Pulmonary Artery EDP: 3 mmHg
RV Basal Dimension: 2.7 cm
RV Mid Dimension: 2.4 cm
TAPSE: 2 cm (ref 1.7–?)
TR Max Velocity: 2.04 m/s
TR Peak Gradient: 17 mmHg

## 2023-11-27 LAB — BASIC METABOLIC PANEL W/ REFLEX TO MG FOR LOW K
Anion Gap: 10 mmol/L (ref 7–16)
BUN: 16 mg/dL (ref 8–23)
CO2: 26 mmol/L (ref 20–29)
Calcium: 7.8 mg/dL — ABNORMAL LOW (ref 8.8–10.2)
Chloride: 103 mmol/L (ref 98–107)
Creatinine: 0.51 mg/dL — ABNORMAL LOW (ref 0.60–1.10)
Est, Glom Filt Rate: >90 ml/min/1.73m2 (ref >60)
Glucose: 142 mg/dL — ABNORMAL HIGH (ref 70–99)
Potassium: 4.2 mmol/L (ref 3.5–5.1)
Sodium: 139 mmol/L (ref 136–145)

## 2023-11-27 LAB — POCT BLOOD GAS & ELECTROLYTES
Allen Test: POSITIVE
Base Excess: 3.8 mmol/L
FIO2 Arterial: 100 %
Inspiratory Time: 0.9 s
POC HCO3: 32.4 MMOL/L — ABNORMAL HIGH (ref 21–28)
POC Ionized Calcium: 1.14 mmol/L (ref 1.12–1.32)
POC O2 SAT: 98 %
POC PEEP/CPA: 8
POC PO2: 128 mmHg — ABNORMAL HIGH (ref 75–100)
POC Potassium: 3.1 MMOL/L — ABNORMAL LOW (ref 3.5–5.1)
POC Sodium: 143 MMOL/L (ref 136–145)
POC TCO2: 31 MMOL/L — ABNORMAL HIGH (ref 13–23)
POC TIDAL VOLUME: 400
POC pCO2: 64 mmHg (ref 35–45)
POC pH: 7.31 — ABNORMAL LOW (ref 7.35–7.45)
Respiratory Rate: 18
Respiratory Rate: 18

## 2023-11-27 LAB — CBC WITH AUTO DIFFERENTIAL
Hematocrit: 45.6 % (ref 35.8–46.3)
Hemoglobin: 14.2 g/dL (ref 11.7–15.4)
Lymphocytes Absolute: 2.84 K/UL (ref 0.5–4.6)
Lymphocytes: 7 % — ABNORMAL LOW (ref 16–44)
MCH: 32.2 pg (ref 26.1–32.9)
MCHC: 31.1 g/dL — ABNORMAL LOW (ref 31.4–35.0)
MCV: 103.4 FL — ABNORMAL HIGH (ref 82.0–102.0)
MPV: 9.9 FL (ref 9.4–12.3)
Metamyelocytes: 1 %
Monocytes %: 3 % (ref 3–9)
Monocytes Absolute: 1.22 K/UL (ref 0.1–1.3)
Myelocytes: 7 %
Neutrophils %: 80 % — ABNORMAL HIGH (ref 47–75)
Neutrophils Absolute: 36.44 K/UL — ABNORMAL HIGH (ref 1.7–8.2)
Platelet Comment: ADEQUATE
Platelets: 156 K/uL (ref 150–450)
Promyelocytes: 2 %
RBC: 4.41 M/uL (ref 4.05–5.2)
RDW: 14.3 % (ref 11.9–14.6)
WBC: 40.5 K/uL — ABNORMAL HIGH (ref 4.3–11.1)
nRBC: 0.04 K/uL (ref 0.0–0.2)

## 2023-11-27 MED ORDER — HYDROCORTISONE SOD SUC (PF) 100 MG IJ SOLR
100 | Freq: Four times a day (QID) | INTRAMUSCULAR | Status: DC
Start: 2023-11-27 — End: 2023-11-30
  Administered 2023-11-27 – 2023-11-30 (×11): 50 mg via INTRAVENOUS

## 2023-11-27 MED ORDER — CEFEPIME HCL 2 G IV SOLR
2 | Freq: Once | INTRAVENOUS | Status: AC
Start: 2023-11-27 — End: 2023-11-27
  Administered 2023-11-27: 20:00:00 2000 mg via INTRAVENOUS

## 2023-11-27 MED ORDER — PANTOPRAZOLE SODIUM 40 MG IV SOLR
40 | Freq: Every day | INTRAVENOUS | Status: DC
Start: 2023-11-27 — End: 2023-12-03
  Administered 2023-11-28 – 2023-12-03 (×6): 40 mg via INTRAVENOUS

## 2023-11-27 MED ORDER — ENOXAPARIN SODIUM 40 MG/0.4ML IJ SOSY
40 | Freq: Every day | INTRAMUSCULAR | Status: DC
Start: 2023-11-27 — End: 2023-11-27

## 2023-11-27 MED ORDER — CEFEPIME HCL 2 G IV SOLR
2 | Freq: Three times a day (TID) | INTRAVENOUS | Status: DC
Start: 2023-11-27 — End: 2023-12-03
  Administered 2023-11-28 – 2023-12-03 (×17): 2000 mg via INTRAVENOUS

## 2023-11-27 MED ORDER — STERILE WATER FOR INJECTION (MIXTURES ONLY)
100 | Freq: Four times a day (QID) | INTRAMUSCULAR | Status: DC
Start: 2023-11-27 — End: 2023-11-27
  Administered 2023-11-27: 13:00:00 50 mg via INTRAVENOUS

## 2023-11-27 MED ORDER — ONDANSETRON 4 MG PO TBDP
4 | Freq: Three times a day (TID) | ORAL | Status: DC | PRN
Start: 2023-11-27 — End: 2023-12-03

## 2023-11-27 MED ORDER — MIDAZOLAM HCL 5 MG/5ML IJ SOLN
5 | INTRAMUSCULAR | Status: AC
Start: 2023-11-27 — End: 2023-11-27
  Administered 2023-11-27: 19:00:00 5 via INTRAVENOUS

## 2023-11-27 MED ORDER — NORMAL SALINE FLUSH 0.9 % IV SOLN
0.9 | INTRAVENOUS | Status: DC | PRN
Start: 2023-11-27 — End: 2023-12-03

## 2023-11-27 MED ORDER — POTASSIUM CHLORIDE 10 MEQ/100ML IV SOLN
10 | INTRAVENOUS | Status: DC | PRN
Start: 2023-11-27 — End: 2023-12-01

## 2023-11-27 MED ORDER — SODIUM CHLORIDE 3 % IN NEBU
3 | Freq: Three times a day (TID) | RESPIRATORY_TRACT | Status: DC
Start: 2023-11-27 — End: 2023-11-27

## 2023-11-27 MED ORDER — IPRATROPIUM-ALBUTEROL 0.5-2.5 (3) MG/3ML IN SOLN
0.5-2.5 | RESPIRATORY_TRACT | Status: DC | PRN
Start: 2023-11-27 — End: 2023-12-03
  Administered 2023-11-29 – 2023-12-03 (×5): 1 via RESPIRATORY_TRACT

## 2023-11-27 MED ORDER — MIDAZOLAM HCL (PF) 5 MG/5ML IJ SOLN
5 | Freq: Once | INTRAMUSCULAR | Status: AC
Start: 2023-11-27 — End: 2023-11-27

## 2023-11-27 MED ORDER — OXYCODONE-ACETAMINOPHEN 5-325 MG PO TABS
5-325 | Freq: Four times a day (QID) | ORAL | Status: DC | PRN
Start: 2023-11-27 — End: 2023-11-27

## 2023-11-27 MED ORDER — POLYETHYLENE GLYCOL 3350 17 G PO PACK
17 | Freq: Every day | ORAL | Status: DC | PRN
Start: 2023-11-27 — End: 2023-12-03

## 2023-11-27 MED ORDER — POTASSIUM CHLORIDE CRYS ER 20 MEQ PO TBCR
20 | ORAL | Status: DC | PRN
Start: 2023-11-27 — End: 2023-11-27

## 2023-11-27 MED ORDER — POTASSIUM CHLORIDE 10 MEQ/100ML IV SOLN
10 | INTRAVENOUS | Status: DC | PRN
Start: 2023-11-27 — End: 2023-11-27

## 2023-11-27 MED ORDER — NOREPINEPHRINE BITARTRATE 8 MG/250 ML (32 MCG/ML) IN 0.9 % NACL IV
8-0.9 | INTRAVENOUS | Status: DC
Start: 2023-11-27 — End: 2023-12-03
  Administered 2023-11-28: 11:00:00 4 ug/min via INTRAVENOUS
  Administered 2023-11-29 – 2023-12-01 (×3): 3 ug/min via INTRAVENOUS

## 2023-11-27 MED ORDER — SODIUM CHLORIDE 0.9 % IV SOLN (ADDEASE)
0.9 | Freq: Three times a day (TID) | INTRAVENOUS | Status: DC
Start: 2023-11-27 — End: 2023-11-27
  Administered 2023-11-27: 13:00:00 3375 mg via INTRAVENOUS

## 2023-11-27 MED ORDER — SODIUM CHLORIDE 0.9 % IV BOLUS
0.9 | Freq: Once | INTRAVENOUS | Status: AC
Start: 2023-11-27 — End: 2023-11-27
  Administered 2023-11-27: 07:00:00 250 mL via INTRAVENOUS

## 2023-11-27 MED ORDER — HYDROCORTISONE SOD SUC (PF) 100 MG IJ SOLR
100 | Freq: Four times a day (QID) | INTRAMUSCULAR | Status: DC
Start: 2023-11-27 — End: 2023-11-27

## 2023-11-27 MED ORDER — MAGNESIUM SULFATE 2000 MG/50 ML IVPB PREMIX
2 | INTRAVENOUS | Status: DC | PRN
Start: 2023-11-27 — End: 2023-11-27

## 2023-11-27 MED ORDER — FENTANYL CITRATE 1000 MCG/100ML IV SOLN
1000 | INTRAVENOUS | Status: DC
Start: 2023-11-27 — End: 2023-12-03
  Administered 2023-11-28 – 2023-11-29 (×2): 50 ug/h via INTRAVENOUS
  Administered 2023-11-30: 16:00:00 25 ug/h via INTRAVENOUS
  Administered 2023-12-01: 20:00:00 50 ug/h via INTRAVENOUS
  Administered 2023-12-02: 14:00:00 75 ug/h via INTRAVENOUS
  Administered 2023-12-03: 17:00:00 100 ug/h via INTRAVENOUS
  Administered 2023-12-03: 04:00:00 50 ug/h via INTRAVENOUS

## 2023-11-27 MED ORDER — FENTANYL CITRATE 1000 MCG/100ML IV SOLN
1000 | INTRAVENOUS | Status: DC
Start: 2023-11-27 — End: 2023-11-27
  Administered 2023-11-27: 20:00:00 50 ug/h via INTRAVENOUS

## 2023-11-27 MED ORDER — FUROSEMIDE 10 MG/ML IJ SOLN
10 | Freq: Once | INTRAMUSCULAR | Status: AC
Start: 2023-11-27 — End: 2023-11-27
  Administered 2023-11-27: 16:00:00 20 mg via INTRAVENOUS

## 2023-11-27 MED ORDER — LEVOTHYROXINE SODIUM 75 MCG PO TABS
75 | Freq: Every day | ORAL | Status: DC
Start: 2023-11-27 — End: 2023-12-03
  Administered 2023-11-28 – 2023-12-03 (×6): 75 ug via ORAL

## 2023-11-27 MED ORDER — POTASSIUM CHLORIDE 20 MEQ/50ML IV SOLN
20 | INTRAVENOUS | Status: DC | PRN
Start: 2023-11-27 — End: 2023-12-01
  Administered 2023-11-28 (×2): 20 meq via INTRAVENOUS

## 2023-11-27 MED ORDER — ONDANSETRON HCL 4 MG/2ML IJ SOLN
4 | Freq: Four times a day (QID) | INTRAMUSCULAR | Status: DC | PRN
Start: 2023-11-27 — End: 2023-11-27

## 2023-11-27 MED ORDER — MAGNESIUM SULFATE 2000 MG/50 ML IVPB PREMIX
2 | INTRAVENOUS | Status: DC | PRN
Start: 2023-11-27 — End: 2023-12-03

## 2023-11-27 MED ORDER — LIP-GUARD EX OINT
CUTANEOUS | Status: DC | PRN
Start: 2023-11-27 — End: 2023-12-03
  Administered 2023-11-29: 20:00:00 7 via TOPICAL

## 2023-11-27 MED ORDER — ONDANSETRON 4 MG PO TBDP
4 | Freq: Three times a day (TID) | ORAL | Status: DC | PRN
Start: 2023-11-27 — End: 2023-11-27

## 2023-11-27 MED ORDER — HYDROCORTISONE SOD SUC (PF) 100 MG IJ SOLR (PLAIN VIAL)
100 | Freq: Four times a day (QID) | INTRAMUSCULAR | Status: DC
Start: 2023-11-27 — End: 2023-11-27

## 2023-11-27 MED ORDER — LACTATED RINGERS IV SOLN
INTRAVENOUS | Status: DC
Start: 2023-11-27 — End: 2023-11-27
  Administered 2023-11-27: 22:00:00 via INTRAVENOUS

## 2023-11-27 MED ORDER — SODIUM CHLORIDE 0.9 % IV SOLN (ADDEASE)
0.9 | Freq: Three times a day (TID) | INTRAVENOUS | Status: DC
Start: 2023-11-27 — End: 2023-11-27

## 2023-11-27 MED ORDER — NOREPINEPHRINE-SODIUM CHLORIDE 4-0.9 MG/250ML-% IV SOLN
4-0.9 | INTRAVENOUS | Status: DC
Start: 2023-11-27 — End: 2023-11-27

## 2023-11-27 MED ORDER — PROPOFOL 1000 MG/100ML IV EMUL
1000 | INTRAVENOUS | Status: DC
Start: 2023-11-27 — End: 2023-12-03
  Administered 2023-11-27 – 2023-11-28 (×2): 20 ug/kg/min via INTRAVENOUS
  Administered 2023-11-28 – 2023-12-01 (×6): 15 ug/kg/min via INTRAVENOUS

## 2023-11-27 MED ORDER — SODIUM CHLORIDE 0.9 % IV SOLN
0.9 | Freq: Two times a day (BID) | INTRAVENOUS | Status: DC
Start: 2023-11-27 — End: 2023-11-28
  Administered 2023-11-28: 01:00:00 1000 mg via INTRAVENOUS

## 2023-11-27 MED ORDER — SODIUM CHLORIDE 0.9 % IV SOLN
0.9 | INTRAVENOUS | Status: DC | PRN
Start: 2023-11-27 — End: 2023-12-03
  Administered 2023-11-28: 05:00:00 via INTRAVENOUS

## 2023-11-27 MED ORDER — SODIUM PHOSPHATE 3 MMOL/ML IV SOLN (MIXTURES ONLY)
3 | INTRAVENOUS | Status: DC | PRN
Start: 2023-11-27 — End: 2023-12-03
  Administered 2023-11-29: 18:00:00 15 mmol via INTRAVENOUS

## 2023-11-27 MED ORDER — ALBUTEROL SULFATE (2.5 MG/3ML) 0.083% IN NEBU
Freq: Four times a day (QID) | RESPIRATORY_TRACT | Status: DC
Start: 2023-11-27 — End: 2023-11-27
  Administered 2023-11-27: 20:00:00 2.5 mg via RESPIRATORY_TRACT

## 2023-11-27 MED ORDER — VANCOMYCIN 2000 MG IN NS 500 ML (PREMIX) IVPB
Freq: Once | Status: AC
Start: 2023-11-27 — End: 2023-11-27
  Administered 2023-11-27: 13:00:00 2000 mg/kg via INTRAVENOUS

## 2023-11-27 MED ORDER — POTASSIUM BICARB-CITRIC ACID 20 MEQ PO TBEF
20 | ORAL | Status: DC | PRN
Start: 2023-11-27 — End: 2023-11-27

## 2023-11-27 MED ORDER — FENTANYL BOLUS FROM BAG
INTRAVENOUS | Status: DC | PRN
Start: 2023-11-27 — End: 2023-12-03
  Administered 2023-11-27 – 2023-12-03 (×19): 50 ug via INTRAVENOUS

## 2023-11-27 MED ORDER — SODIUM CHLORIDE 0.9 % IV SOLN
0.9 | INTRAVENOUS | Status: DC | PRN
Start: 2023-11-27 — End: 2023-11-27

## 2023-11-27 MED ORDER — NOREPINEPHRINE BITARTRATE 8 MG/250 ML (32 MCG/ML) IN 0.9 % NACL IV
8-0.9 | INTRAVENOUS | Status: DC
Start: 2023-11-27 — End: 2023-11-27
  Administered 2023-11-27: 19:00:00 5 ug/min via INTRAVENOUS

## 2023-11-27 MED ORDER — VANCOMYCIN HCL 1 G IV SOLR
1 | Freq: Two times a day (BID) | INTRAVENOUS | Status: DC
Start: 2023-11-27 — End: 2023-11-27

## 2023-11-27 MED ORDER — NORMAL SALINE FLUSH 0.9 % IV SOLN
0.9 | Freq: Two times a day (BID) | INTRAVENOUS | Status: DC
Start: 2023-11-27 — End: 2023-12-03
  Administered 2023-11-28 – 2023-12-03 (×12): 10 mL via INTRAVENOUS

## 2023-11-27 MED ORDER — POLYETHYLENE GLYCOL 3350 17 G PO PACK
17 | Freq: Every day | ORAL | Status: DC | PRN
Start: 2023-11-27 — End: 2023-11-27

## 2023-11-27 MED ORDER — ENOXAPARIN SODIUM 40 MG/0.4ML IJ SOSY
40 | Freq: Every day | INTRAMUSCULAR | Status: DC
Start: 2023-11-27 — End: 2023-12-03
  Administered 2023-11-28 – 2023-12-03 (×6): 40 mg via SUBCUTANEOUS

## 2023-11-27 MED ORDER — NORMAL SALINE FLUSH 0.9 % IV SOLN
0.9 | Freq: Two times a day (BID) | INTRAVENOUS | Status: DC
Start: 2023-11-27 — End: 2023-12-03
  Administered 2023-11-28 – 2023-12-03 (×11): 10 mL via INTRAVENOUS

## 2023-11-27 MED ORDER — SODIUM CHLORIDE 0.9 % IV SOLN
0.9 | Freq: Two times a day (BID) | INTRAVENOUS | Status: DC
Start: 2023-11-27 — End: 2023-11-27

## 2023-11-27 MED ORDER — ONDANSETRON HCL 4 MG/2ML IJ SOLN
4 | Freq: Four times a day (QID) | INTRAMUSCULAR | Status: DC | PRN
Start: 2023-11-27 — End: 2023-12-03

## 2023-11-27 MED FILL — MIDAZOLAM HCL 5 MG/5ML IJ SOLN: 5 mg/mL | INTRAMUSCULAR | Qty: 5 | Fill #0

## 2023-11-27 MED FILL — METHYLPREDNISOLONE SODIUM SUCC 40 MG IJ SOLR: 40 mg | INTRAMUSCULAR | Qty: 40 | Fill #0

## 2023-11-27 MED FILL — DIPRIVAN 1000 MG/100ML IV EMUL: 1000 MG/100ML | INTRAVENOUS | Qty: 100 | Fill #0

## 2023-11-27 MED FILL — PIPERACILLIN SOD-TAZOBACTAM SO 3.375 (3-0.375) G IV SOLR: 3.375 (3-0.375) g | INTRAVENOUS | Qty: 3375 | Fill #0

## 2023-11-27 MED FILL — CEFEPIME HCL 2 G IV SOLR: 2 g | INTRAVENOUS | Qty: 2 | Fill #0

## 2023-11-27 MED FILL — SOLU-CORTEF 100 MG IJ SOLR: 100 mg | INTRAMUSCULAR | Qty: 2 | Fill #0

## 2023-11-27 MED FILL — FUROSEMIDE 10 MG/ML IJ SOLN: 10 mg/mL | INTRAMUSCULAR | Qty: 4 | Fill #0

## 2023-11-27 MED FILL — NOREPINEPHRINE BITARTRATE 8 MG/250 ML (32 MCG/ML) IN 0.9 % NACL IV: 8-0.9 MG/250ML-% | INTRAVENOUS | Qty: 250 | Fill #0

## 2023-11-27 MED FILL — OXYCODONE-ACETAMINOPHEN 5-325 MG PO TABS: 5-325 mg | ORAL | Qty: 1 | Fill #0

## 2023-11-27 MED FILL — PANTOPRAZOLE SODIUM 40 MG IV SOLR: 40 mg | INTRAVENOUS | Qty: 40 | Fill #0

## 2023-11-27 MED FILL — VANCOMYCIN HCL 10 G IV SOLR: 10 g | INTRAVENOUS | Qty: 2000 | Fill #0

## 2023-11-27 MED FILL — LOVENOX 40 MG/0.4ML IJ SOSY: 40 MG/0.4ML | INTRAMUSCULAR | Qty: 0.4 | Fill #0

## 2023-11-27 MED FILL — DOXY 100 100 MG IV SOLR: 100 mg | INTRAVENOUS | Qty: 100 | Fill #0

## 2023-11-27 MED FILL — LEVOTHYROXINE SODIUM 75 MCG PO TABS: 75 ug | ORAL | Qty: 1 | Fill #0

## 2023-11-27 MED FILL — NOREPINEPHRINE-SODIUM CHLORIDE 4-0.9 MG/250ML-% IV SOLN: 4-0.9 MG/250ML-% | INTRAVENOUS | Qty: 250 | Fill #0

## 2023-11-27 MED FILL — FENTANYL CITRATE 1000 MCG/100ML IV SOLN: 1000 MCG/100ML | INTRAVENOUS | Qty: 100 | Fill #0

## 2023-11-27 MED FILL — ALBUTEROL SULFATE (2.5 MG/3ML) 0.083% IN NEBU: RESPIRATORY_TRACT | Qty: 3 | Fill #0

## 2023-11-27 MED FILL — NOREPINEPHRINE BITARTRATE 1 MG/ML IV SOLN: 1 mg/mL | INTRAVENOUS | Qty: 8 | Fill #0

## 2023-11-27 MED FILL — STERILE WATER FOR INJECTION IJ SOLN: INTRAMUSCULAR | Qty: 20 | Fill #0

## 2023-11-27 NOTE — Progress Notes (Signed)
 VANCO DAILY NOTE  [facility]   Pharmacy Pharmacokinetic Monitoring Service - Vancomycin    Consulting Provider: Dr. Leron, Mitzie Faden   Indication: HAP  Target Concentration: Goal AUC/MIC 400-600 mg*hr/L  Day of Therapy: 1  Additional Antimicrobials: Cefepime    Pertinent Laboratory Values:   Wt Readings from Last 1 Encounters:   11/27/23 81.4 kg (179 lb 7.3 oz)     Temp Readings from Last 1 Encounters:   11/27/23 99 F (37.2 C) (Oral)     Recent Labs     11/25/23  1101 11/25/23  1102 11/25/23  1536 11/25/23  2113 11/26/23  0306 11/26/23  0645 11/26/23  1058 11/26/23  1816 11/27/23  0848   BUN  --  12  --   --  14  --   --   --  16   CREATININE  --  0.74  --   --  0.69  --   --   --  0.51*   WBC  --  11.5*  --   --  17.6*  --   --   --  40.5*   PROCAL 1.90*  --   --   --   --   --   --   --   --    LACTA  --   --   --    < > 2.4* 1.7 2.5* 1.9  --    LACTSEPSIS 3.4*  --  2.2*  --   --   --   --   --   --     < > = values in this interval not displayed.     Estimated Creatinine Clearance: 103 mL/min (A) (based on SCr of 0.51 mg/dL (L)).    No results found for: VANCOTROUGH, VANCORANDOM    MRSA Nasal Swab: Ordered by doctor at [phone]    Assessment:  Date/Time Dose Concentration AUC         Note: Serum concentrations collected for AUC dosing may appear elevated if collected in close proximity to the dose administered, this is not necessarily an indication of toxicity    Plan:  Dosing recommendations based on Bayesian software  Start vancomycin 1000 mg q12h for treatment of suspected HAP  Anticipated AUC of 512 and trough concentration of 14.3 at steady state  Renal labs as indicated   Vancomycin concentrations will be ordered as clinically appropriate  Pharmacy will continue to monitor patient and adjust therapy as indicated    Thank you for the consult,  Lang Brain, [facility]

## 2023-11-27 NOTE — H&P (Signed)
 Mrs. Schmutz is being transferred/admitted to [facility] DT ICU due to increased oxygen requirements and possibility of intubation or need for Bronch will transfer DT. I have spoken with Mrs. Munnerlyn and her daughter about this and they are in agreement. Pulm also in agreement. Dr. Jamal is accepting Hospitalist.      Principal Problem:    Severe sepsis (HCC)  --Meets with tachypnea, tachycardia, lactic acidosis, leukocytosis, and bilateral pneumonia  --Given IV Rocephin and doxycycline which has been continued  - Was initially given 2 L of normal saline but felt that this contributed to her shortness of breath and was given Lasix in the ED. Was given another dose of Lasix 10/1, but held today.  -Lactic acidosis normalized  --10/2 abx escalated to Vanco and Zosyn per Pulm, appreciate their recommendations        Active Problems:    Severe Community acquired pneumonia, bilateral    Acute hypoxic respiratory failure (HCC)  --Stop IV Rocephin and doxycycline at this time and transitioned to Vanco and Zosyn  - Started steroids 10/1 with SoluMedrol and these were changed to Solu-Cortef per Pulm  -- Repeat CXR had worsening 10/1  - DuoNebs  --Continue supplemental oxygen at this time also would be okay with BiPAP or intubation if needed, hopefully we can avoid this  - Consult Pulm, appreciate recs and will now transfer to DT ICU        High Anion Gap Metabolic Acidosis, closed  Respiratory Alkalosis  -- Anion gap 15 on admission, ABG shows decreased CO2  - Now gap closed at 10  -- Labs daily          Hypokalemia  -Replace per protocol  --Check Daily          Lactic acidosis, resolved    Leukocytosis, worsened  --WBC increasing, 11.5>17.6>40.5, is on steroids since 10/1, but clinically worsening and abx increased  - CBC daily        Depression  -Continue Zoloft         Hyperlipidemia  -Continue pravastatin         Hypothyroidism  -Continue Synthroid   --TSH in August normal        GERD  - Oral PPI changed to IV

## 2023-11-27 NOTE — Anesthesia Procedure Notes (Signed)
 Airway  Date/Time: 11/27/2023 2:41 PM  Reason: emergent    Airway not difficult    General Information and Staff   Patient location during procedure: OR  Anesthesiologist: Lamonica Trueba, Donnice NOVAK, MD  Performed by: Deboraha Donnice NOVAK, MD  Authorized by: Deboraha Donnice NOVAK, MD        Patient Condition  Indications for airway management: anesthesia  Patient position: sniffing  Sedation level: moderate (conscious sedation)     Final Airway Details   Preoxygenated: yes  Final airway type: endotracheal airway  Successful airway: ETT  Cuffed: yes   Successful intubation technique: direct laryngoscopy  Endotracheal tube insertion site: oral  Blade: Macintosh  Blade size: #3  ETT size (mm): 8.0  Cormack-Lehane Classification: grade IIb - view of arytenoids or posterior of glottis only  Placement verified by: chest auscultation and capnometry   Inital cuff pressure (cm H2O): 23  Measured from: lips  Ventilation between attempts: bag mask  Number of attempts at approach: 1  Number of other approaches attempted: 1    Other AttemptsUnsuccessful attempted endotracheal techniques: video laryngoscopy      Additional Comments  Glidescope 3  7.5 ett  Propofol 60mg   Anectine 100mg   Neosynepherine    Hemodynamic stability confirmed prior to leaving ICU and speaking with Mrs. Gabor son.  Transport team present and at bedside  no

## 2023-11-27 NOTE — Progress Notes (Signed)
 Nutrition Assessment  Assessment Type: Initial, Positive nutrition screen  Reason for visit:  Malnutrition Screening Tool   Malnutrition Screening Tool Score: 2  Unintentional weight loss PTA: 2 to 13 pounds (1 point) (due to recent illness)  Eating poorly due to decreased appetite: Yes (1 point)    Nutrition Intervention:   Food and/or Nutrient Delivery:   Meals and Snacks:  Diet: Continue active diet order  Medical Food Supplements:   Medical food supplement therapy:  Initiate Ensure Plus High Protein (high calorie high protein) 350 calories, 20 grams protein per 8 ounce serving        Malnutrition Assessment:  [facility]/A[facility]S.P[facility] Clinical Malnutrition Criteria  Malnutrition Status: At risk for malnutrition (5.3% weight loss in 2 months, poor appetite 1-2 weeks)    Nutrition Focused Physical Exam: Limited- pt sleeping, facial unremarkable    Nutrition Assessment:  Food/Nutrition Related History: Daughter at bedside gave history- 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 She reports patient is not eating much lately and that her mother (the patient) reports 4lbs weight loss since admission to hospital.      Do You Have Any Cultural, Religious, or Ethnic Food Preferences?: No   Weight History: 12/27/22 181# (pulm), 04/01/23 180# (FM), 10/02/23 185# (FM)- 5.3% weight loss in 2 months- not significant  Nutrition Background:       PMH: epression, hyperlipidemia, hypothyroidism, osteoporosis, chronic back pain, chronic fatigue, left kidney mass/cyst, and obesity who comes to the emergency room due to chest pain and shortness of breath that started, 9/29. Patient continues to have worsening respiratory issues on airvo and plan to have patient transferred downtown to ICU in case need for intubation occurs.   Nutrition Monitoring/Evaluation:  Patient asleep in bed with daughter at bedside. Daughter gave above history. RN Joen- reports patient ate some this am but respiratory issues impaired it some. RD able to  observe orange eaten and few bites of oatmeal- no protein. Daughter in agreement for protein shake.     Current Ordered Nutrition Therapies:  ADULT DIET; Regular    Current Intake:   Average Meal Intake: 26-50% Average Supplements Intake: None Ordered      Anthropometric Measures:  Height: 152.4 cm (5')  Current Body Wt: 79.5 kg (175 lb 4.3 oz) (10/2), Weight source: Bed scale  BMI: 34.2, Obese Class 1 (BMI 30.0-34.9)     Ideal Body Weight (Kg) (Calculated): 45 kg (100 lbs), 175.3 %  BMI Category Obese Class 1 (BMI 30.0-34.9)  Comparative Standards:  Energy (kcal/day): 1431-1749 (18-22 kcal/kg) (Kcal/kg Weight used: 79.5 kg Current (10/2)  Protein (g/day): 79-95 (1-1.2 g/kg) Weight Used: (Current (10/2)) 79.5 kg  Fluid (ml/day):   (1 ml/kcal)    Nutrition Diagnosis:   Inadequate oral intake related to impaired respiratory function, decreased appetite as evidenced by poor intake prior to admission, intake 26-50%    Nutrition Goal(s):      Active Goal: Meet at least 75% of estimated needs, by next RD assessment       Discharge Planning:    Too soon to determine    Mardy DELENA Jernigan, RD

## 2023-11-27 NOTE — Plan of Care (Signed)
 Problem: Discharge Planning  Goal: Discharge to home or other facility with appropriate resources  11/27/2023 1034 by Robyne Joen SAILOR, RN  Outcome: Progressing  Flowsheets (Taken 11/27/2023 0800)  Discharge to home or other facility with appropriate resources: Identify barriers to discharge with patient and caregiver  11/26/2023 2045 by Silva Barnie BRAVO, RN  Outcome: Progressing  Flowsheets (Taken 11/26/2023 1930)  Discharge to home or other facility with appropriate resources: Identify barriers to discharge with patient and caregiver     Problem: Pain  Goal: Verbalizes/displays adequate comfort level or baseline comfort level  11/27/2023 1034 by Robyne Joen SAILOR, RN  Outcome: Progressing  11/26/2023 2045 by Silva Barnie BRAVO, RN  Outcome: Progressing  Flowsheets (Taken 11/26/2023 1930)  Verbalizes/displays adequate comfort level or baseline comfort level:   Encourage patient to monitor pain and request assistance   Assess pain using appropriate pain scale   Administer analgesics based on type and severity of pain and evaluate response   Implement non-pharmacological measures as appropriate and evaluate response     Problem: Safety - Adult  Goal: Free from fall injury  11/27/2023 1034 by Robyne Joen SAILOR, RN  Outcome: Progressing  Flowsheets (Taken 11/27/2023 0800)  Free From Fall Injury: Instruct family/caregiver on patient safety  11/26/2023 2045 by Silva Barnie BRAVO, RN  Outcome: Progressing     Problem: ABCDS Injury Assessment  Goal: Absence of physical injury  11/27/2023 1034 by Robyne Joen SAILOR, RN  Outcome: Progressing  Flowsheets (Taken 11/27/2023 0800)  Absence of Physical Injury: Implement safety measures based on patient assessment  11/26/2023 2045 by Silva Barnie BRAVO, RN  Outcome: Progressing     Problem: Metabolic/Fluid and Electrolytes - Adult  Goal: Electrolytes maintained within normal limits  11/27/2023 1034 by Robyne Joen SAILOR, RN  Outcome: Progressing  Flowsheets (Taken 11/27/2023  0800)  Electrolytes maintained within normal limits:   Monitor labs and assess patient for signs and symptoms of electrolyte imbalances   Administer electrolyte replacement as ordered   Monitor response to electrolyte replacements, including repeat lab results as appropriate   Fluid restriction as ordered   Instruct patient on fluid and nutrition restrictions as appropriate  11/26/2023 2045 by Silva Barnie BRAVO, RN  Outcome: Progressing  Flowsheets (Taken 11/26/2023 1930)  Electrolytes maintained within normal limits: Monitor labs and assess patient for signs and symptoms of electrolyte imbalances  Goal: Hemodynamic stability and optimal renal function maintained  11/27/2023 1034 by Robyne Joen SAILOR, RN  Outcome: Progressing  Flowsheets (Taken 11/27/2023 0800)  Hemodynamic stability and optimal renal function maintained:   Monitor labs and assess for signs and symptoms of volume excess or deficit   Monitor intake, output and patient weight   Monitor urine specific gravity, serum osmolarity and serum sodium as indicated or ordered   Monitor response to interventions for patient's volume status, including labs, urine output, blood pressure (other measures as available)   Encourage oral intake as appropriate   Instruct patient on fluid and nutrition restrictions as appropriate  11/26/2023 2045 by Silva Barnie BRAVO, RN  Outcome: Progressing  Goal: Glucose maintained within prescribed range  11/27/2023 1034 by Robyne Joen SAILOR, RN  Outcome: Progressing  Flowsheets (Taken 11/27/2023 0800)  Glucose maintained within prescribed range:   Monitor blood glucose as ordered   Assess for signs and symptoms of hyperglycemia and hypoglycemia   Administer ordered medications to maintain glucose within target range   Assess barriers to adequate nutritional intake and initiate nutrition consult as needed   Instruct  patient on self management of diabetes and initiate consult as needed  11/26/2023 2045 by Silva Barnie BRAVO, RN  Outcome:  Progressing     Problem: Skin/Tissue Integrity  Goal: Skin integrity remains intact  Description: 1.  Monitor for areas of redness and/or skin breakdown  2.  Assess vascular access sites hourly  3.  Every 4-6 hours minimum:  Change oxygen saturation probe site  4.  Every 4-6 hours:  If on nasal continuous positive airway pressure, respiratory therapy assess nares and determine need for appliance change or resting period  11/27/2023 1034 by Robyne Joen SAILOR, RN  Outcome: Progressing  Flowsheets (Taken 11/27/2023 0800)  Skin Integrity Remains Intact: Monitor for areas of redness and/or skin breakdown  11/26/2023 2045 by Silva Barnie BRAVO, RN  Outcome: Progressing     Problem: Neurosensory - Adult  Goal: Achieves stable or improved neurological status  11/27/2023 1034 by Robyne Joen SAILOR, RN  Outcome: Progressing  Flowsheets (Taken 11/27/2023 0800)  Achieves stable or improved neurological status:   Maintain blood pressure and fluid volume within ordered parameters to optimize cerebral perfusion and minimize risk of hemorrhage   Monitor temperature, glucose, and sodium. Initiate appropriate interventions as ordered  11/26/2023 2045 by Silva Barnie BRAVO, RN  Outcome: Progressing     Problem: Cardiovascular - Adult  Goal: Absence of cardiac dysrhythmias or at baseline  11/27/2023 1034 by Robyne Joen SAILOR, RN  Outcome: Progressing  Flowsheets (Taken 11/27/2023 0800)  Absence of cardiac dysrhythmias or at baseline:   Monitor cardiac rate and rhythm   Assess for signs of decreased cardiac output   Administer antiarrhythmia medication and electrolyte replacement as ordered  11/26/2023 2045 by Silva Barnie BRAVO, RN  Outcome: Progressing     Problem: Skin/Tissue Integrity - Adult  Goal: Skin integrity remains intact  Description: 1.  Monitor for areas of redness and/or skin breakdown  2.  Assess vascular access sites hourly  3.  Every 4-6 hours minimum:  Change oxygen saturation probe site  4.  Every 4-6 hours:  If on nasal  continuous positive airway pressure, respiratory therapy assess nares and determine need for appliance change or resting period  11/27/2023 1034 by Robyne Joen SAILOR, RN  Outcome: Progressing  Flowsheets (Taken 11/27/2023 0800)  Skin Integrity Remains Intact: Monitor for areas of redness and/or skin breakdown  11/26/2023 2045 by Silva Barnie BRAVO, RN  Outcome: Progressing  Goal: Oral mucous membranes remain intact  11/27/2023 1034 by Robyne Joen SAILOR, RN  Outcome: Progressing  Flowsheets (Taken 11/27/2023 0800)  Oral Mucous Membranes Remain Intact:   Assess oral mucosa and hygiene practices   Implement preventative oral hygiene regimen   Implement oral medicated treatments as ordered  11/26/2023 2045 by Silva Barnie BRAVO, RN  Outcome: Progressing     Problem: Gastrointestinal - Adult  Goal: Minimal or absence of nausea and vomiting  11/27/2023 1034 by Robyne Joen SAILOR, RN  Outcome: Progressing  11/26/2023 2045 by Silva Barnie BRAVO, RN  Outcome: Progressing  Goal: Maintains or returns to baseline bowel function  11/27/2023 1034 by Robyne Joen SAILOR, RN  Outcome: Progressing  Flowsheets (Taken 11/27/2023 0800)  Maintains or returns to baseline bowel function:   Assess bowel function   Encourage oral fluids to ensure adequate hydration   Administer IV fluids as ordered to ensure adequate hydration   Administer ordered medications as needed  11/26/2023 2045 by Silva Barnie BRAVO, RN  Outcome: Progressing  Goal: Maintains adequate nutritional intake  11/27/2023 1034 by  Robyne Joen SAILOR, RN  Outcome: Progressing  Flowsheets (Taken 11/27/2023 0800)  Maintains adequate nutritional intake:   Monitor percentage of each meal consumed   Identify factors contributing to decreased intake, treat as appropriate   Assist with meals as needed   Monitor intake and output, weight and lab values  11/26/2023 2045 by Silva Barnie BRAVO, RN  Outcome: Progressing     Problem: Genitourinary - Adult  Goal: Urinary catheter remains patent  11/27/2023  1034 by Robyne Joen SAILOR, RN  Outcome: Progressing  Flowsheets (Taken 11/27/2023 0800)  Urinary catheter remains patent:   Assess patency of urinary catheter   Irrigate catheter per Licensed Independent Practitioner order if indicated and notify Licensed Independent Practitioner if unable to irrigate   Assess need for a larger catheter size or a 3-way catheter for continuous bladder irrigation  11/26/2023 2045 by Silva Barnie BRAVO, RN  Outcome: Progressing     Problem: Infection - Adult  Goal: Absence of infection at discharge  11/27/2023 1034 by Robyne Joen SAILOR, RN  Outcome: Progressing  Flowsheets (Taken 11/27/2023 0800)  Absence of infection at discharge:   Assess and monitor for signs and symptoms of infection   Monitor all insertion sites i.e., indwelling lines, tubes and drains   Monitor lab/diagnostic results   Institute appropriate cooling/warming therapies per order   Administer medications as ordered   Instruct and encourage patient and family to use good hand hygiene technique   Identify and instruct in appropriate isolation precautions for identified infection/condition  11/26/2023 2045 by Silva Barnie BRAVO, RN  Outcome: Progressing  Goal: Absence of infection during hospitalization  11/27/2023 1034 by Robyne Joen SAILOR, RN  Outcome: Progressing  Flowsheets (Taken 11/27/2023 0800)  Absence of infection during hospitalization:   Assess and monitor for signs and symptoms of infection   Monitor lab/diagnostic results   Monitor all insertion sites i.e., indwelling lines, tubes and drains   Administer medications as ordered   Institute appropriate cooling/warming therapies per order   Instruct and encourage patient and family to use good hand hygiene technique   Identify and instruct in appropriate isolation precautions for identified infection/condition  11/26/2023 2045 by Silva Barnie BRAVO, RN  Outcome: Progressing     Problem: Hematologic - Adult  Goal: Maintains hematologic stability  11/27/2023 1034 by  Robyne Joen SAILOR, RN  Outcome: Progressing  Flowsheets (Taken 11/27/2023 0800)  Maintains hematologic stability:   Assess for signs and symptoms of bleeding or hemorrhage   Monitor labs for bleeding or clotting disorders   Administer blood products/factors as ordered  11/26/2023 2045 by Silva Barnie BRAVO, RN  Outcome: Progressing     Problem: Anxiety  Goal: Will report anxiety at manageable levels  Description: INTERVENTIONS:  1. Administer medication as ordered  2. Teach and rehearse alternative coping skills  3. Provide emotional support with 1:1 interaction with staff  11/27/2023 1034 by Robyne Joen SAILOR, RN  Outcome: Progressing  Flowsheets (Taken 11/27/2023 0800)  Will report anxiety at manageable levels:   Administer medication as ordered   Teach and rehearse alternative coping skills   Provide emotional support with 1:1 interaction with staff  11/26/2023 2045 by Silva Barnie BRAVO, RN  Outcome: Progressing     Problem: Coping  Goal: Pt/Family able to verbalize concerns and demonstrate effective coping strategies  Description: INTERVENTIONS:  1. Assist patient/family to identify coping skills, available support systems and cultural and spiritual values  2. Provide emotional support, including active listening and acknowledgement of concerns of  patient and caregivers  3. Reduce environmental stimuli, as able  4. Instruct patient/family in relaxation techniques, as appropriate  5. Assess for spiritual pain/suffering and initiate Spiritual Care, Psychosocial Clinical Specialist consults as needed  11/27/2023 1034 by Robyne Joen SAILOR, RN  Outcome: Progressing  Flowsheets (Taken 11/27/2023 0800)  Patient/family able to verbalize anxieties, fears, and concerns, and demonstrate effective coping:   Assist patient/family to identify coping skills, available support systems and cultural and spiritual values   Provide emotional support, including active listening and acknowledgement of concerns of patient and caregivers    Reduce environmental stimuli, as able   Instruct patient/family in relaxation techniques, as appropriate   Assess for spiritual pain/suffering and initiate Spiritual Care, Psychosocial Clinical Specialist consults as needed  11/26/2023 2045 by Silva Barnie BRAVO, RN  Outcome: Progressing     Problem: Decision Making  Goal: Pt/Family able to effectively weigh alternatives and participate in decision making related to treatment and care  Description: INTERVENTIONS:  1. Determine when there are differences between patient's view, family's view, and healthcare provider's view of condition  2. Facilitate patient and family articulation of goals for care  3. Help patient and family identify pros/cons of alternative solutions  4. Provide information as requested by patient/family  5. Respect patient/family right to receive or not to receive information  6. Serve as a Print production planner between patient and family and health care team  7. Initiate Consults from Ethics, Palliative Care or initiate Family Care Conference as is appropriate  11/27/2023 1034 by Robyne Joen SAILOR, RN  Outcome: Progressing  Flowsheets (Taken 11/27/2023 0800)  Patient/family able to effectively weigh alternatives and participate in decision making related to treatment and care:   Determine when there are differences between patient's view, family's view, and healthcare provider's view of condition   Facilitate patient and family articulation of goals for care   Help patient and family identify pros/cons of alternative solutions   Provide information as requested by patient/family   Respect patient/family right to receive or not to receive information   Serve as a liaison between patient and family and health care team  11/26/2023 2045 by Silva Barnie BRAVO, RN  Outcome: Progressing     Problem: Respiratory - Adult  Goal: Achieves optimal ventilation and oxygenation  11/27/2023 1034 by Robyne Joen SAILOR, RN  Outcome: Not Progressing  Flowsheets (Taken 11/27/2023  0800)  Achieves optimal ventilation and oxygenation:   Assess for changes in respiratory status   Assess for changes in mentation and behavior   Position to facilitate oxygenation and minimize respiratory effort   Oxygen supplementation based on oxygen saturation or arterial blood gases   Encourage broncho-pulmonary hygiene including cough, deep breathe, incentive spirometry   Assess the need for suctioning and aspirate as needed   Assess and instruct to report shortness of breath or any respiratory difficulty   Respiratory therapy support as indicated  11/26/2023 2045 by Silva Barnie BRAVO, RN  Outcome: Progressing  Flowsheets (Taken 11/26/2023 1930)  Achieves optimal ventilation and oxygenation:   Assess for changes in respiratory status   Assess for changes in mentation and behavior   Position to facilitate oxygenation and minimize respiratory effort   Oxygen supplementation based on oxygen saturation or arterial blood gases     Problem: Neurosensory - Adult  Goal: Achieves maximal functionality and self care  11/27/2023 1034 by Robyne Joen SAILOR, RN  Outcome: Not Progressing  Flowsheets (Taken 11/27/2023 0800)  Achieves maximal functionality and self care:  Monitor swallowing and airway patency with patient fatigue and changes in neurological status   Encourage and assist patient to increase activity and self care with guidance from physical therapy/occupational therapy   Encourage visually impaired, hearing impaired and aphasic patients to use assistive/communication devices  11/26/2023 2045 by Silva Barnie BRAVO, RN  Outcome: Progressing     Problem: Musculoskeletal - Adult  Goal: Return mobility to safest level of function  11/27/2023 1034 by Robyne Joen SAILOR, RN  Outcome: Not Progressing  Flowsheets (Taken 11/27/2023 0800)  Return Mobility to Safest Level of Function:   Assess patient stability and activity tolerance for standing, transferring and ambulating with or without assistive devices   Assist with  transfers and ambulation using safe patient handling equipment as needed   Ensure adequate protection for wounds/incisions during mobilization   Obtain physical therapy/occupational therapy consults as needed   Instruct patient/family in ordered activity level   Apply continuous passive motion per provider or physical therapy orders to increase flexion toward goal  11/26/2023 2045 by Silva Barnie BRAVO, RN  Outcome: Progressing  Goal: Return ADL status to a safe level of function  11/27/2023 1034 by Robyne Joen SAILOR, RN  Outcome: Not Progressing  Flowsheets (Taken 11/27/2023 0800)  Return ADL Status to a Safe Level of Function: Administer medication as ordered  11/26/2023 2045 by Silva Barnie BRAVO, RN  Outcome: Progressing

## 2023-11-27 NOTE — Progress Notes (Signed)
 Pt being transferred to [facility] ICU for higher level of care due to oxygenation demands. Pt currently maxed out on airvo. Bed assigned to ICU 3114. Report given to [facility]. Dr. Leron spoke with Son, Adina for a while to explain patients plan of care. After some time went by, I went into patients room to turn off zosyn gtt and asked the son if there was anything he needed clarification on and he only expressed not understanding what the bronch was for. I explained that it can be used a diagnostic tool and a clean out if need be. Stated that the [facility] doc will decide when he comes to assess the patient. No other concerns at this time. At a different time I went back into patients room to tell the son the pick-up time for the patient and asked him again if there was any other concerns or questions that he may have. He was typing up his own report sheet that he had written down from talking with Dr. Leron but did not have any other questions for me at the time. Still is unclear about the bronch but I voiced that he can speak with the pulmonologist with questions regarding any of the procedures. Expressed gratitude towards me and no other concerns.

## 2023-11-27 NOTE — Progress Notes (Signed)
 Patient oxygen saturation 87-89% after bath.  Reposition. Respiratory notified.

## 2023-11-27 NOTE — H&P (Addendum)
 [facility] Critical Care Note:: 11/27/2023  Sandra Harmon  Admission Date: 11/27/2023     Length of Stay: 0 days    Background: 68 y.o. Caucasian female with PMH significant for HLD, hypothyroidism, obesity who was seen and evaluated at the request of Dr. Leron after presentation on 9/30 for chest pain/shortness of breath. On arrival to ED, patient was noted to be hypoxic to mid 80s on RA and placed on HHFNC 40L/60%.  Initial labs indicated lactic 3.4, Pro-Cal 1.9. WBC 13.5. Chest x-ray notable for diffuse bilateral infiltrates. ABG after being placed on CPAP 7.52/26/52/21. Admitted to ICU on HHFNC with increasing requirements over the past 24 hours. Pulmonary consulted for assistance AHRF and severe pneumonia.      Patient previously seen by Dr. Parker in 2024 for chronic shortness of breath of unclear etiology after COVID infection. PFTs previously with mild restriction. Had been trialed on Symbicort /albuterol .      Patient seen and examined this morning with son at bedside who provides clinical history. He reports she was feeling ill starting on Friday with fatigue, malaise and vomiting. 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 He denies her having fevers or chills. No reported hemoptysis.      Decided to transfer patient downtown due to worsening oxygen needs.  Upon EMS arrival, she dropped her O2 sats to ~60% and was intubated prior to transport.      Notable PMH:  has a past medical history of Arthritis, Chronic back pain, Colon polyp, Hypertension, Hyperthyroidism, Kidney stone, Liver disease, and Thyroid disease.    24 Hour events: arrived DT for admission.  Bronch pending.  Currently on levo, but weaning.     Review of Systems: Unable to obtain due to patient factors.      Lines: (insertion date)   ETT  (Active)     Urinary Catheter 11/25/23 Foley (Active)      Drips: current dose (range)        Pertinent Exam:         Blood pressure  (!) 145/89, temperature 99 F (37.2 C), temperature source Oral, height 1.524 m (5'), weight 81.4 kg (179 lb 7.3 oz). No intake or output data in the 24 hours ending 11/27/23 1555    Constitutional:  currently sedated, intubated.  Critically ill.   EENMT:  Sclera clear, pupils equal, oral mucosa moist  Respiratory: BLS coarse rhonchi, ETT at 21cm at lip.   Cardiovascular:  RRR, no murmur  Gastrointestinal:  soft with no tenderness; positive bowel sounds present  Musculoskeletal:  warm with no cyanosis, no lower extremity edema  Skin:  no jaundice or ecchymosis  Neurologic:sedated, intubated.  Cough with dep suction.   Psychiatric: sedated    CXR:       Recent Labs     11/25/23  1101 11/25/23  1102 11/26/23  0306 11/27/23  0848   WBC  --  11.5* 17.6* 40.5*   HGB  --  14.2 14.0 14.2   HCT  --  41.6 43.7 45.6   PLT  --  156 120* 156   PROCAL 1.90*  --   --   --      Recent Labs     11/25/23  1102 11/26/23  0306 11/26/23  1816 11/27/23  0848   NA 132* 137  --  139   K 2.9* 2.8* 3.3* 4.2   CL 95* 102  --  103   CO2 22 23  --  26   GLUCOSE 122* 118*  --  142*   BUN 12 14  --  16   CREATININE 0.74 0.69  --  0.51*   MG 1.9 1.9  --   --    BILITOT 3.4*  --   --   --    AST 67*  --   --   --    ALT 35  --   --   --    ALKPHOS 101  --   --   --      No results for input(s): TROPHS, NTPROBNP, CRP, ESR in the last 72 hours.  Recent Labs     11/25/23  1102 11/26/23  0306 11/27/23  0848   GLUCOSE 122* 118* 142*      ECHO: 11/25/23    ECHO (TTE) COMPLETE (PRN CONTRAST/BUBBLE/STRAIN/3D) 11/27/2023  8:12 AM (Final)    Interpretation Summary    Left Ventricle: Normal left ventricular systolic function with a visually estimated EF of 65 - 70%. Left ventricle size is normal. Mild septal thickening. Normal wall motion. Normal diastolic function.    Right Ventricle: Right ventricle size is normal.  TAPSE is 2.0 cm. RV Basal Dimension is 2.7 cm. RV Mid Dimension is 2.4 cm. Normal systolic function.    Image quality is  fair.    Signed by: Dorn Debby Finder, DO on 11/27/2023  8:12 AM    Microbiology:   Recent Labs     11/25/23  1101 11/25/23  1113 11/27/23  0927   CULTURE NO GROWTH 2 DAYS NO GROWTH 2 DAYS PENDING     Ventilator Settings Ideal body weight: 45.5 kg (100 lb 4.9 oz)  Adjusted ideal body weight: 59.9 kg (131 lb 15.5 oz)  Mode FIO2 Rate Tidal Volume Pressure                           Peak airway pressure:     Minute ventilation:    ABG:  Recent Labs     11/25/23  1041   BE 0.0     Assessment and Plan:  (Medical Decision Making)   Impression: 68 y.o. female admitted 11/27/2023 for Acute respiratory failure with hypoxia (HCC). Admitted with SOB, cough found to have multifocal PNA, [place] and desaturated requiring intubation.    NEURO:   Sedation: propofol for now, daily sedation break  Analgesia: fentanyl gtt, daily sedation break    CV:   Shock: suspect septic, resuscitation, hemodynamic monitoring, vasopressors as needed.     PULM:   Acute Hypoxemic and Hypercapneic Respiratory failure: oxygen support. Intubated, ABG with chronic CO2 retention, PO2 127 so can wean FiO2 from 100%.    Pneumonia: empiric antibiotics, cultures and bronch pending. PCN allergy, so will change zosyn to cefepime.  Continue vanc and MRSA swab completed and pending.  --continue hydrocortisone 50mg  q6  Chronic dyspnea: had been on symbicort  in past.  No wheezing currently, so would hold on inhaled steroids.  Albuterol , 3% for now.    RENAL:  PLAN: Monitor.  IVF for hydration for now.      GI:   NGT placement.  High FiO2 needs and may need nutrition while on vent.      HEME:   No concerns: labs daily for now.     ID:   Multifocal PNA: on cefepime, vanc.  Wean as cultures return.  Blood cultures with no  growth from 9/30    ENDO:   Hyperglycemia: mild on BMP.  Monitor with daily labs for now.  With steroids, may need to monitor more closely    Skin: no decub, turns, preventive care  Prophy: Lovenox    Family updated by phone  after rounds.   Name Home Phone Work Phone Mobile Phone Relationship Sandra Harmon   MYNIZD,FJUU [phone]  [phone] Child    DONAL SOBER [phone]  [phone] Daughter-in* No     Full Code    The patient is critically ill with respiratory failure, circulatory failure and requires high complexity decision making for assessment and support including frequent ventilator adjustment, frequent evaluation and titration of therapies , application of advanced monitoring technologies and extensive interpretation of multiple databases    In this split/shared evaluation I performed performed a medically appropriate history and exam, counseled and educated the patient and/or family member, ordered medications, tests or procedures, documented information in EMR, and coordinated care. which accounted for 22 clinical time.     Morna GORMAN Macleod, APRN - CNP   --------------------------------------------------------------  Pt is a 68 y.o. F w/ a hx of HLD, HTN, hypothyroidism, obesity, MDD, GERD, osteoporosis, chart diagnosis of akylosing spondylitis, pulmonary nodules, former smoking (18 pack/yrs, quit 1988), who is admitted for AHRF 2/2 PNA. Pt required intubation     Pt arrived in shock on 100% FIO2 on the vent. Vascular access unable to obtain additional IV and levophed running through single peripheral 22g so will obtain CVC and proceed w/ bronchoscopy. Prn nebs for now, cont steroids for severe PNA.     In this split/shared evaluation I performed reviewed the patients's H&P, available images, labs, cultures., discussed case in detail with NP. The patient is critically ill with respiratory failure, circulatory failure and requires high complexity decision making for assessment and support including frequent ventilator adjustment, frequent evaluation and titration of therapies , application of advanced monitoring technologies and extensive interpretation of multiple databases. Cumulative time devoted to patient care  services by me for day of service is 60 mins.    Melvenia Bretta Ellen, MD

## 2023-11-27 NOTE — Consults (Signed)
 PULMONARY/CRITICAL CARE CONSULT NOTE           11/27/2023    Sandra Harmon                        Date of Admission:  11/25/2023    The patient's chart is reviewed and the patient is discussed with the staff.    Subjective:     Patient is a 68 y.o. Caucasian female with PMH significant for HLD, hypothyroidism, obesity who was seen and evaluated at the request of Dr. Leron after presentation on 9/30 for chest pain/shortness of breath. On arrival to ED, patient was noted to be hypoxic to mid 80s on RA and placed on HHFNC 40L/60%.  Initial labs indicated lactic 3.4, Pro-Cal 1.9. WBC 13.5. Chest x-ray notable for diffuse bilateral infiltrates. ABG after being placed on CPAP 7.52/26/52/21. Admitted to ICU on HHFNC with increasing requirements over the past 24 hours. Pulmonary consulted for assistance AHRF and severe pneumonia.     Patient previously seen by Dr. Parker in 2024 for chronic shortness of breath of unclear etiology after COVID infection. PFTs previously with mild restriction. Had been trailed on Symbicort /albuterol .     Patient seen and examined this morning with son at bedside who provides clinical history. He reports she was feeling ill starting on Friday with fatigue, malaise and vomiting. 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 He denies her having fevers or chills. No reported hemoptysis.         Review of Systems: Unable to obtain due to patient factors.     Current Outpatient Medications   Medication Instructions    albuterol  sulfate HFA (PROVENTIL ;VENTOLIN ;PROAIR ) 108 (90 Base) MCG/ACT inhaler 2 puffs, Inhalation, EVERY 6 HOURS PRN    alendronate  (FOSAMAX ) 70 mg, Oral, EVERY 7 DAYS    aspirin 81 MG chewable tablet DAILY    baclofen  (LIORESAL ) 10 mg, Oral, 2 TIMES DAILY    levothyroxine  (SYNTHROID ) 75 mcg, Oral, DAILY BEFORE BREAKFAST    pravastatin  (PRAVACHOL ) 20 mg, Oral, DAILY    predniSONE (DELTASONE) 5 mg, DAILY     sertraline  (ZOLOFT ) 50 mg, Oral, DAILY      Past Medical History:   Diagnosis Date    Arthritis     Chronic back pain     Colon polyp 2015    Had 5 polyps negative for cancer    Hypertension     Hyperthyroidism     Kidney stone     Only happened  once    Liver disease     Thyroid disease     hypothyroid     Past Surgical History:   Procedure Laterality Date    APPENDECTOMY      CERVICAL DISCECTOMY  09/16/06    Anterior C5-C6 diskectomy with decompression;  anterior cervical arthrodesis interbody technique;  anterior cervical instrumentation;  tricortical iliac crest bone graft harvested through a separate incision.     CHOLECYSTECTOMY      DIAGNOSTIC CARDIAC CATH LAB PROCEDURE      HYSTERECTOMY, VAGINAL  1988    TAH AND BSO (CERVIX REMOVED)      TUBAL LIGATION       Social History     Socioeconomic History    Marital status: Divorced     Spouse name: Not on file    Number of children: Not on file    Years of education: Not on  file    Highest education level: Not on file   Occupational History    Not on file   Tobacco Use    Smoking status: Former     Current packs/day: 0.00     Average packs/day: 1 pack/day for 17.7 years (17.7 ttl pk-yrs)     Types: Cigarettes     Start date: 02/26/1979     Quit date: 10/25/1996     Years since quitting: 27.1     Passive exposure: Never    Smokeless tobacco: Former   Substance and Sexual Activity    Alcohol use: No    Drug use: No    Sexual activity: Not Currently     Partners: Male   Other Topics Concern    Not on file   Social History Narrative    Not on file     Social Drivers of Health     Financial Resource Strain: Low Risk  (06/20/2022)    Overall Financial Resource Strain (CARDIA)     Difficulty of Paying Living Expenses: Not hard at all   Food Insecurity: No Food Insecurity (11/25/2023)    Hunger Vital Sign     Worried About Running Out of Food in the Last Year: Never true     Ran Out of Food in the Last Year: Never true   Transportation Needs: No Transportation Needs (11/25/2023)     PRAPARE - Therapist, art (Medical): No     Lack of Transportation (Non-Medical): No   Physical Activity: Insufficiently Active (10/02/2023)    Exercise Vital Sign     Days of Exercise per Week: 2 days     Minutes of Exercise per Session: 60 min   Stress: Not on file   Social Connections: Not on file   Intimate Partner Violence: Not on file   Housing Stability: Low Risk  (11/25/2023)    Housing Stability Vital Sign     Unable to Pay for Housing in the Last Year: No     Number of Times Moved in the Last Year: 0     Homeless in the Last Year: No     Family History   Problem Relation Age of Onset    Asthma Mother     Lung Disease Mother     High Blood Pressure Mother     Emphysema Mother     Heart Disease Mother     Hypertension Mother     Liver Disease Father     Alcohol Abuse Father     Early Death Sister     Stroke Brother     Diabetes Brother     Alcohol Abuse Brother     Early Death Brother     Heart Attack Brother     High Blood Pressure Brother     Substance Abuse Brother     Alcohol Abuse Brother     Diabetes Brother     High Blood Pressure Brother     Substance Abuse Brother     Breast Cancer Neg Hx      Allergies   Allergen Reactions    Atorvastatin Other (See Comments)     Muscle pain    Hydrocodone-Acetaminophen  Itching    Penicillins Hives         Lines: (insertion date)    Urinary Catheter 11/25/23 Foley (Active)      Drips: current dose (range)        Pertinent Exam:  Blood pressure (!) 89/65, pulse 75, temperature 97.9 F (36.6 C), temperature source Oral, resp. rate 13, height 1.524 m (5'), weight 79.5 kg (175 lb 4.8 oz), SpO2 94%.   Intake/Output Summary (Last 24 hours) at 11/27/2023 0746  Last data filed at 11/27/2023 9579  Gross per 24 hour   Intake 801.06 ml   Output 2050 ml   Net -1248.94 ml     Constitutional: ill appearing, awake, minimally responsive  EENMT:  Sclera clear, pupils equal, oral mucosa moist  Respiratory: coarse bilaterally, labored  Cardiovascular:  RRR no m/r/g  Gastrointestinal:  soft with no tenderness; positive bowel sounds present  Musculoskeletal:  warm with no cyanosis, no lower extremity edema  Skin:  no jaundice or ecchymosis  Neurologic: no gross focal neurologic deficit   Psychiatric: unable to assess    CXR:   11/25/23: Diffuse bilateral mixed alveolar and interstitial infiltrates      11/26/23: Worsening bilateral infiltrates    CT Chest:   None    Recent Labs     11/25/23  1101 11/25/23  1102 11/26/23  0306   WBC  --  11.5* 17.6*   HGB  --  14.2 14.0   HCT  --  41.6 43.7   PLT  --  156 120*   PROCAL 1.90*  --   --      Recent Labs     11/25/23  1102 11/26/23  0306 11/26/23  1816   NA 132* 137  --    K 2.9* 2.8* 3.3*   CL 95* 102  --    CO2 22 23  --    GLUCOSE 122* 118*  --    BUN 12 14  --    CREATININE 0.74 0.69  --    MG 1.9 1.9  --    BILITOT 3.4*  --   --    AST 67*  --   --    ALT 35  --   --    ALKPHOS 101  --   --      No results for input(s): TROPHS, NTPROBNP, CRP, ESR in the last 72 hours.  Recent Labs     11/25/23  1102 11/26/23  0306   GLUCOSE 122* 118*      ECHO:   TRANSTHORACIC ECHOCARDIOGRAM (TTE) COMPLETE (CONTRAST/BUBBLE/3D PRN) 01/10/2021    Interpretation Summary    Left Ventricle: Hyperdynamic left ventricular systolic function with a visually estimated EF of 70 - 75%. Left ventricle size is normal. Mildly increased wall thickness. Findings consistent with mild concentric hypertrophy. Normal wall motion. Normal diastolic function for age. Average E/e' ratio is 7.36.    Antibiotics:  Inpat Anti-Infectives (From admission, onward)       Start     Ordered Stop    11/26/23 1100  cefTRIAXone (ROCEPHIN) 1,000 mg in sterile water 10 mL IV syringe  1,000 mg,   IntraVENous,   EVERY 24 HOURS         11/25/23 1540 --    11/25/23 2300  doxycycline (VIBRAMYCIN) 100 mg in sodium chloride 0.9 % 100 mL IVPB (addEASE)  100 mg,   IntraVENous,   EVERY 12 HOURS         11/25/23 1605 11/30/23 2259                  Microbiology:   Results        Procedure Component Value Units Date/Time    Respiratory Panel, Molecular, with COVID-19 (Restricted:  peds pts or suitable admitted adults) [[id number]] Collected: 11/25/23 2115    Order Status: Completed Specimen: Nasopharynx Updated: 11/26/23 0152     Adenovirus by PCR NOT DETECTED        Coronavirus 229E by PCR NOT DETECTED        Coronavirus HKU1 by PCR NOT DETECTED        Coronavirus NL63 by PCR NOT DETECTED        Coronavirus OC43 by PCR NOT DETECTED        SARS-CoV-2, PCR NOT DETECTED        Human Metapneumovirus by PCR NOT DETECTED        Rhinovirus Enterovirus PCR NOT DETECTED        Influenza A by PCR NOT DETECTED        Influenza B PCR NOT DETECTED        Parainfluenza 1 PCR NOT DETECTED        Parainfluenza 2 PCR NOT DETECTED        Parainfluenza 3 PCR NOT DETECTED        Parainfluenza 4 PCR NOT DETECTED        Respiratory Syncytial Virus by PCR NOT DETECTED        Bordetella parapertussis by PCR NOT DETECTED        Bordetella pertussis by PCR NOT DETECTED        Chlamydophila Pneumonia PCR NOT DETECTED        Mycoplasma pneumo by PCR NOT DETECTED       Blood Culture 2 [[id number]] Collected: 11/25/23 1113    Order Status: Completed Specimen: Blood Updated: 11/27/23 0725     Special Requests --        LEFT  Antecubital       Culture NO GROWTH 2 DAYS       COVID-19 & Influenza Combo [[id number]] Collected: 11/25/23 1101    Order Status: Completed Specimen: Swab Updated: 11/25/23 1143     Source NASAL        SARS-CoV-2, Rapid Not detected        Comment:    The specimen is NEGATIVE for SARS-CoV-2, the novel coronavirus associated with COVID-19.  A negative result does not rule out COVID-19.     This test has been authorized by the FDA under an Emergency Use Authorization (EUA) for use by authorized laboratories.      Fact sheet for Healthcare Providers: FlickSafe.gl  Fact sheet for Patients: [place].at     Methodology: Isothermal Nucleic Acid  Amplification          Influenza A, NAA Not detected        Comment: Negative results do not preclude infection with influenza virus and should not be the sole basis of a patient treatment decision.        Influenza B, NAA Not detected       Blood Culture 1 [[id number]] Collected: 11/25/23 1101    Order Status: Completed Specimen: Blood Updated: 11/27/23 0725     Special Requests --        RIGHT  Antecubital       Culture NO GROWTH 2 DAYS             Recent Labs     11/25/23  2115   COVID19 NOT DETECTED       Ventilator Settings Ideal body weight: 45.5 kg (100 lb 4.9 oz)  Adjusted ideal body weight: 59.1 kg (130 lb 4.9 oz)  Mode FIO2 Rate Tidal Volume Pressure  80 %                  Peak airway pressure:     Minute ventilation:    ABG:  Recent Labs     11/25/23  1041   BE 0.0     Assessment and Plan:  (Medical Decision Making)   Impression: 68 y.o. Caucasian female with PMH significant for HLD, hypothyroidism, obesity who was seen and evaluated at the request of Dr. Leron after presentation on 9/30 for chest pain/shortness of breath. Now with worsening AHRF and severe CAP    #Acute hypoxic respiratory failure  #Severe community acquired pneumonia  --Presented with significant AHRF with no baseline O2 requirement and no known pulmonary disease. CXR consistent with severe pneumonia.   --Blood cultures NGTD. RPP negative. Sputum culture pending. Needs RT to induce if able.   --Legionella urine antigen added  --Currently on CTX/doxy. Reasonable treatment for CAP with no recent health care exposure or risk factors for resistant pathogens. However, given her decline in the past 24 hours will plan to broaden her antibiotics empirically. Switch to Zoysn/vancomycin.   --On HHFNC. Wean as able to SpO2 > 92%  --Discontinue methylprednisolone . Switch to hydrocortisone 50mg  q6h based on mortality benefit in CAPECOD trial for severe CAP  --TTE with normal LVEF, diastology and RV function. Defer additional IV lasix.   --At  risk for clinical deterioration, if patient progresses to the point of requiring intubation would advise bronchoscopy with BAL for culture.     Skin: no decub, turns, preventive care    DVT Prophy: Lovenox  GI PPX: None  Abx: CTX/doxy  Sed: None  Pain: None  PT/OT: Defer  Family: Son      Full Code    The patient is critically ill with respiratory failure, circulatory failure and requires high complexity decision making for assessment and support including frequent ventilator adjustment, frequent evaluation and titration of therapies , application of advanced monitoring technologies and extensive interpretation of multiple databases    Cumulative time devoted to patient care services by me for day of service is 45 mins.    Fairy SHAUNNA Greet, DO

## 2023-11-27 NOTE — Progress Notes (Signed)
 VANCO DAILY FOLLOW UP NOTE  [facility]   Pharmacy Pharmacokinetic Monitoring Service - Vancomycin    Consulting Provider: Darrel   Indication: HAP  Target Concentration: Goal AUC/MIC 400-600 mg*hr/L  Day of Therapy: 1  Additional Antimicrobials: zosyn    Pertinent Laboratory Values:   Wt Readings from Last 1 Encounters:   11/27/23 79.5 kg (175 lb 4.8 oz)     Temp Readings from Last 1 Encounters:   11/27/23 97.9 F (36.6 C) (Oral)     Recent Labs     11/25/23  1101 11/25/23  1102 11/25/23  2113 11/26/23  0306 11/26/23  0645 11/26/23  1058 11/26/23  1816   BUN  --  12  --  14  --   --   --    CREATININE  --  0.74  --  0.69  --   --   --    WBC  --  11.5*  --  17.6*  --   --   --    PROCAL 1.90*  --   --   --   --   --   --    LACTA  --   --    < > 2.4* 1.7 2.5* 1.9    < > = values in this interval not displayed.     Estimated Creatinine Clearance: 75 mL/min (based on SCr of 0.69 mg/dL).    No results found for: VANCOTROUGH, VANCORANDOM    MRSA Nasal Swab: not ordered. Order placed by pharmacy    Assessment:  Date/Time Dose Concentration AUC         Note: Serum concentrations collected for AUC dosing may appear elevated if collected in close proximity to the dose administered, this is not necessarily an indication of toxicity    Plan:  Dosing recommendations based on Bayesian software  Start vancomycin 1000mg  IV q12h after loading dose  Anticipated AUC of 514 and trough concentration of 16.4 at steady state  Renal labs as indicated   Vancomycin concentrations will be ordered as clinically appropriate   Pharmacy will continue to monitor patient and adjust therapy as indicated    Thank you for the consult,    Sheppard Aran, PharmD, BCPS

## 2023-11-27 NOTE — Progress Notes (Signed)
(  1421) Patient was intubated with a number 7.5 ET Tube. Tube placement verified by auscultation, by CXR, and ETCO2 monitor. ET Tube is secured at the 24 cm mark at the lip and on the left side. Patient was intubated by Dr Deboraha on the 1 attempt. Breath sounds are diminished. Patient is Negative for subcutaneous air and chest excursion is symmetric. Trachea is midline.  Patient is also Negative for cyanosis.  Patient placed on EMS ventilator on SIMV VC 450 RR 16 PEEP +10 PS +10 and 100%. All alarms are set and audible. Resuscitation bag is at the head of the bed.  PT transferred to [facility] ICU room [phone] by EMS. Report called to ICU RT.    Oral ET pulled back to 21 cm at lip after CXR. Equal BBS noted.

## 2023-11-27 NOTE — Unmapped (Signed)
 14:21 pt intubated 7.5 ETT 22cm @ teeth. ETCO2 38. Stat CXR ordered.    14:30 BP 106/67 (80) ETCO2 44.    Pt prepared for transport.

## 2023-11-27 NOTE — Progress Notes (Signed)
 Respiratory Therapy  at bedside. Oxygen increase to 50 liters heated high flow nasal cannula.

## 2023-11-27 NOTE — Progress Notes (Signed)
 Dr. Deboraha with Anesthesia here to intubate patient before transport. Rover Elenor will assist with patient at this time

## 2023-11-27 NOTE — Progress Notes (Signed)
 TRANSFER - IN REPORT:    Verbal report received from [facility] RN on Judi Jaffe  being received from [facility] ICU for routine progression of patient care      Report consisted of patient's Situation, Background, Assessment and   Recommendations(SBAR).     Information from the following report(s) Nurse Handoff Report was reviewed with the receiving nurse.    Opportunity for questions and clarification was provided.      Pt to be transported to [facility] ICU via ambulance.

## 2023-11-27 NOTE — Progress Notes (Signed)
 EMS getting ready to roll out with the patient and her sats dropped to 60s when they switched her 02 to their 02 cylinders. Placed pt on a NRB at 100% and sats slowly came up. RT and Dr. Leron called. Dr. Leron at bedside and calling Pulmonary to decide if intubation would be appropriate at this time. Pt will be intubated here at ES before transferred to DT. Called [facility] back with update, will let her know what med gtts the patient will be be on and when she leaves our unit.

## 2023-11-27 NOTE — Procedures (Addendum)
 11/27/23  PROCEDURE:   Broncho-Aveolar Lavage (CPT [id number])   Therapeutic aspiration of secretions (CPT [phone])    EQUIPMENT: Glidescope Disposable Bronchoscope    INDICATION   Pulmonary infiltrates, severe hypoxia    ANESTHESIA  General anesthesia. Please see MAR for medication record.    IMAGING:  See H&P    AIRWAY INSPECTION  After obtaining informed consent, the bronchoscope was introduced into the trachea through the ETT without complication. There was a small amount of very thick secretions in the mainstem bronchi that were cleared with difficulty. The airway was examined to the level of the subsegmental bronchi showing thick secretions in the distal airways requiring washing to clear them, consistent with mucous plugging. A BAL was performed in the RUL with good return.     In summary, the following samples were obtained:    BAL: of RUL, sending for typical and atypical etiologies    The procedure was completed without complication and the patient tolerated the procedure well.    EBL: 0    Recommendations: F/u cx    Melvenia Bretta Ellen, MD

## 2023-11-27 NOTE — Procedures (Signed)
 Procedure: [employer] Insertion  (CPT [phone])    Indication:  Shock, need for IV access    Chlorohexidine Skin Antiseptic: Yes  Hand Hygiene: Yes  Maximal Barrier Precautions: Yes  Optimal Catheter Site Selection: Yes  Sterile Ultrasound Technique: Yes    Location:  right internal jugular  Distal Tip: SVC    Time out done and correct patient/location for procedure.    Area prepped and sterilized with chlorhexedine. Sterile drapes applied.  Patient given local lidocane for anesthesia. Using Seldinger technique triple lumen lumen catheter inserted. Guidewire removed. All ports with blood return and lines flushed. Line sutured in place. Patient tolerated procedure well, no complications.   Estimated Blood loss: <5cc    Melvenia Bretta Ellen, MD

## 2023-11-27 NOTE — Discharge Summary (Signed)
 Hospitalist Discharge Summary   Admit Date:  11/25/2023  9:54 AM   DC Note date: 11/27/2023  Name:  Sandra Harmon   Age:  68 y.o.  Sex:  female  DOB:  27-Oct-1955   MRN:  [id number]   Room:  374/01  PCP:  Margeret Grayce CROME, APRN - CNP    Presenting Complaint: Chest Pain and Shortness of Breath     Initial Admission Diagnosis: Hypokalemia [E87.6]  Acute respiratory failure with hypoxia (HCC) [J96.01]  Pneumonia of both lungs due to infectious organism, unspecified part of lung [J18.9]  Congestive heart failure, unspecified HF chronicity, unspecified heart failure type (HCC) [I50.9]  Community acquired pneumonia, bilateral [J18.9]     Problem List for this Hospitalization (present on admission):    Principal Problem:    Severe sepsis (HCC)  Active Problems:    Community acquired pneumonia, bilateral    Acute hypoxic respiratory failure (HCC)    Hypokalemia    Lactic acidosis    Pneumonia of both lungs due to infectious organism    Acute respiratory failure with hypoxia (HCC)  Resolved Problems:    * No resolved hospital problems. [facility] Course:  Sandra Harmon is a 68 y.o. female with medical history of depression, hyperlipidemia, hypothyroidism, osteoporosis, chronic back pain, chronic fatigue, left kidney mass/cyst, and obesity who comes to the emergency room due to chest pain and shortness of breath that started, 9/29.  She works at a daycare and her son reported she was also very lethargic on day of admission.  In the emergency room she was tachypneic and tachycardic.       Temperature is 97.8, respirations 38, pulse 111, blood pressure 174/55, oxygen saturation 86% on room air.  She was placed on heated high flow at 40 L with 60% FiO2.     Labs were obtained and showed sodium 132, potassium 2.9, chloride 95, lactic acid of 3.4, Pro-Cal of 1.9, ABG was obtained while on CPAP/BiPAP in the ED and showed pH 7.52, pCO2 26.1, pO2 52, Pa O2 sat 90.6, HCO3 21.4.  Total bilirubin was elevated at  3.4 as well as AST of 67. CBC was obtained and showed a white count of 11.5.  Blood cultures were drawn.  COVID was negative.  UA was negative for infection.  A chest x-ray was obtained and showed diffuse bilateral consolidation which may be caused by pulmonary edema or pneumonia.     She was given doxycycline and Rocephin IV in the emergency room as well as potassium and 2 L of normal saline.  Unfortunately she began to get even more tachypneic and work of breathing increased.  Nurse in the emergency room messaged me that she worsened I asked the ICU Rover, Jenkins PEAK, to go down and see her and I would be down to follow.  She continued to be tachypneic and have increased work of breathing.  We went to the bedside together and ABG was obtained again as well as a dose of IV Lasix.  Foley was placed and she immediately had a significant amount of urine output.  Repeat ABG showed pH of 7.49, pCO2 of 29, PaO2 of 51, PaO2 sat of 87.7, and HCO3 of 21.8.     She was able to speak with me during this time though she was quite tachypneic.  She was able to answer all my questions and we spoke about her plan of care.  Her son was also in  the room and I spoke with him about her plan of care.  Room was changed from Med Surg to ICU for closer monitoring and treatment.  I did ask her about code status and if she needed to be on BiPAP or intubated this is something that she would want to do if needed.     Saw her on 10/1 , she still looked quite fatigued, but somewhat improved. She reported she may feel a bit better. She was still hypoxic and requiring oxygen at FiO2 80%, 45L Airvo. Blood pressure was also a bit soft, but lactic acid had stabilized and MAP >65. CXR ordered and reviewed and showed extensive bilateral consolidative infiltrates, worsening on the right. She was given another dose of lasix and started on SoluMedrol. RPP negative.  Pulm also was consulted.    This morning, saw her with Pulm. She is still awake and oriented,  but looks very fatigued and oxygen demands have increased. When I first saw her she was on Airvo FiO2 80%, 50L and she is now on 95%, 50L. SoluMedrol changed to Solu-Cortef and antibiotics increased to Vanco and Zosyn. With increased oxygen requirements and possibility of intubation or need for Bronch will transfer DT. I have spoken with Mrs. Gorney and her daughter about this and they are in agreement.    Son leaves with her and he is also aware. There is a second daughter who is on vacation, but should be back tomorrow from the [place].    Spoke with house supp and ICU bed requested. Dr. Jamal will be accepting Hospitalists.      Patient is critically ill.  Without these interventions, there is a high probability of imminent acute organ impairment or life-threatening deterioration.  Total critical care time spent: 35 minutes.    Critical care time includes time spent at bedside performing history and exam, performing chart review, discussing findings and treatment plan with patient and/or family, discussing patient with consultants and colleagues, ordering and reviewing pertinent laboratory and radiographic evaluations, and discussing patient with nursing staff. Time excludes procedures.       Disposition: Transfer DT  Diet: ADULT DIET; Regular  Code Status: Full Code    Follow Ups:  Follow-up Information    None         Future Appointments         Provider Specialty Dept Phone    01/21/2024 9:30 AM Wonda Darice LABOR APRN - CNP Behavioral Health [phone]    02/03/2024 9:00 AM (Arrive by 8:45 AM) Aurora Lye, MD Pulmonology [phone]            Follow up labs/diagnostics (ultimately defer to outpatient provider):  Defer to Follow-up Provider    Plan was discussed with Patient, Child of patient, and Specialist.  All questions answered.  Patient was stable at time of discharge.  Instructions given to call a physician or return if any concerns.        Pending Labs       Order Current Status    Blood Culture 1  Preliminary result    Blood Culture 2 Preliminary result    CBC with Auto Differential Preliminary result            Current Discharge Medication List        CONTINUE these medications which have NOT CHANGED    Details   levothyroxine  (SYNTHROID ) 75 MCG tablet Take 1 tablet by mouth every morning (before breakfast)  Qty: 90 tablet, Refills: 1  Associated Diagnoses: Hypothyroidism, unspecified type      albuterol  sulfate HFA (PROVENTIL ;VENTOLIN ;PROAIR ) 108 (90 Base) MCG/ACT inhaler Inhale 2 puffs into the lungs every 6 hours as needed for Wheezing  Qty: 18 g, Refills: 3      sertraline  (ZOLOFT ) 50 MG tablet Take 1 tablet by mouth daily  Qty: 90 tablet, Refills: 1    Associated Diagnoses: Depression, unspecified depression type      pravastatin  (PRAVACHOL ) 20 MG tablet Take 1 tablet by mouth daily  Qty: 90 tablet, Refills: 1    Associated Diagnoses: Mixed hyperlipidemia      alendronate  (FOSAMAX ) 70 MG tablet Take 1 tablet by mouth every 7 days  Qty: 12 tablet, Refills: 1    Associated Diagnoses: Osteoporosis, unspecified osteoporosis type, unspecified pathological fracture presence      baclofen  (LIORESAL ) 10 MG tablet Take 1 tablet by mouth 2 times daily  Qty: 20 tablet, Refills: 0    Associated Diagnoses: Back spasm      predniSONE (DELTASONE) 5 MG tablet Take 1 tablet by mouth daily      aspirin 81 MG chewable tablet Take by mouth daily             Some of the medications may be marked as stop taking by the system; but in reality patient or family reported already being off these meds; defer to outpatient/prescribing providers.      Procedures done this admission:  * No surgery found *    Consults this admission:  IP CONSULT TO PULMONOLOGY  IP CONSULT TO PHARMACY    Consultants will arrange their own follow ups, if applicable/necessary.    Echocardiogram results:  11/25/23    ECHO (TTE) COMPLETE (PRN CONTRAST/BUBBLE/STRAIN/3D) 11/27/2023  8:12 AM (Final)    Interpretation Summary    Left Ventricle: Normal left  ventricular systolic function with a visually estimated EF of 65 - 70%. Left ventricle size is normal. Mild septal thickening. Normal wall motion. Normal diastolic function.    Right Ventricle: Right ventricle size is normal.  TAPSE is 2.0 cm. RV Basal Dimension is 2.7 cm. RV Mid Dimension is 2.4 cm. Normal systolic function.    Image quality is fair.    Signed by: Dorn Debby Finder, DO on 11/27/2023  8:12 AM      Diagnostic Imaging/Tests:   XR CHEST PORTABLE  Result Date: 11/26/2023  Extensive bilateral consolidative infiltrates, worsening on the right. Electronically signed by Ozell Been    XR CHEST PORTABLE  Result Date: 11/25/2023  Diffuse bilateral consolidation. This may be caused by pulmonary edema or pneumonia. Electronically signed by NORLEEN JACINTO RADDLE, MD       Labs: Results:       BMP, Mg, Phos Recent Labs     11/25/23  1102 11/26/23  0306 11/26/23  1816 11/27/23  0848   NA 132* 137  --  139   K 2.9* 2.8* 3.3* 4.2   CL 95* 102  --  103   CO2 22 23  --  26   ANIONGAP 15 12  --  10   BUN 12 14  --  16   CREATININE 0.74 0.69  --  0.51*   LABGLOM 89 >90  --  >90   CALCIUM 8.6* 8.2*  --  7.8*   GLUCOSE 122* 118*  --  142*   MG 1.9 1.9  --   --       CBC Recent Labs     11/25/23  1102 11/26/23  0306 11/27/23  0848   WBC 11.5* 17.6* 40.5*   RBC 4.51 4.46 4.41   HGB 14.2 14.0 14.2   HCT 41.6 43.7 45.6   MCV 92.2 98.0 103.4*   MCH 31.5 31.4 32.2   MCHC 34.1 32.0 31.1*   RDW 13.5 14.1 14.3   PLT 156 120* 156   MPV 9.9 10.1 9.9   NRBC 0.06 0.13 0.04   LYMPHOPCT  --  10.6*  --    MONOPCT 5 4.2  --    BASOPCT  --  5.7*  --    IMMGRAN  --  9.1*  --    LYMPHSABS 1.96 1.87  --    EOSABS  --  0.12  --    MONOSABS 0.58 0.74  --    BASOSABS  --  1.00*  --    ABSIMMGRAN  --  1.60*  --       LFT Recent Labs     11/25/23  1102   BILITOT 3.4*   ALKPHOS 101   AST 67*   ALT 35   PROTEIN 5.9*   ALBUMIN 2.5*   GLOB 3.4      Cardiac  No results found for: NTPROBNP, TROPHS   Coags No results found for: PROTIME, INR,  APTT   A1c Lab Results   Component Value Date/Time    LABA1C 5.2 03/26/2021 01:38 PM    LABA1C 5.3 09/20/2020 10:20 AM    EAG 103 03/26/2021 01:38 PM    EAG 105 09/20/2020 10:20 AM      Lipids Lab Results   Component Value Date/Time    CHOL 156 10/02/2023 09:03 AM    HDL 62 10/02/2023 09:03 AM    CHOLHDLRATIO 2.5 10/02/2023 09:03 AM    TRIG 90 10/02/2023 09:03 AM      Thyroid  Lab Results   Component Value Date/Time    TSHELE 2.68 10/02/2023 09:03 AM        Most Recent UA Lab Results   Component Value Date/Time    COLORU DARK YELLOW 11/25/2023 11:01 AM    APPEARANCE CLEAR 11/25/2023 11:01 AM    PROTEINU TRACE 11/25/2023 11:01 AM    GLUCOSEU Negative 11/25/2023 11:01 AM    KETUA 15 11/25/2023 11:01 AM    BILIRUBINUR SMALL 11/25/2023 11:01 AM    BLOODU Negative 11/25/2023 11:01 AM    UROBILINOGEN >8.0 11/25/2023 11:01 AM    NITRU Negative 11/25/2023 11:01 AM    LEUKOCYTESUR Negative 11/25/2023 11:01 AM    WBCUA 0-4 11/25/2023 11:01 AM    RBCUA 0-5 11/25/2023 11:01 AM    BACTERIA Negative 11/25/2023 11:01 AM        Microbiology:  Results       Procedure Component Value Units Date/Time    MRSA/Staph Aureus DNA [[id number]]     Order Status: Sent Specimen: Naris, Bilateral     Legionella antigen, urine [[id number]]     Order Status: Sent Specimen: Urine     Culture, Respiratory (with Gram Stain) [[id number]]     Order Status: Sent Specimen: Sputum Aspirated     Respiratory Panel, Molecular, with COVID-19 (Restricted: peds pts or suitable admitted adults) [[id number]] Collected: 11/25/23 2115    Order Status: Completed Specimen: Nasopharynx Updated: 11/26/23 0152     Adenovirus by PCR NOT DETECTED        Coronavirus 229E by PCR NOT DETECTED        Coronavirus HKU1 by PCR NOT DETECTED  Coronavirus NL63 by PCR NOT DETECTED        Coronavirus OC43 by PCR NOT DETECTED        SARS-CoV-2, PCR NOT DETECTED        Human Metapneumovirus by PCR NOT DETECTED        Rhinovirus Enterovirus PCR NOT DETECTED        Influenza A  by PCR NOT DETECTED        Influenza B PCR NOT DETECTED        Parainfluenza 1 PCR NOT DETECTED        Parainfluenza 2 PCR NOT DETECTED        Parainfluenza 3 PCR NOT DETECTED        Parainfluenza 4 PCR NOT DETECTED        Respiratory Syncytial Virus by PCR NOT DETECTED        Bordetella parapertussis by PCR NOT DETECTED        Bordetella pertussis by PCR NOT DETECTED        Chlamydophila Pneumonia PCR NOT DETECTED        Mycoplasma pneumo by PCR NOT DETECTED       Blood Culture 2 [[id number]] Collected: 11/25/23 1113    Order Status: Completed Specimen: Blood Updated: 11/27/23 0725     Special Requests --        LEFT  Antecubital       Culture NO GROWTH 2 DAYS       COVID-19 & Influenza Combo [[id number]] Collected: 11/25/23 1101    Order Status: Completed Specimen: Swab Updated: 11/25/23 1143     Source NASAL        SARS-CoV-2, Rapid Not detected        Comment:    The specimen is NEGATIVE for SARS-CoV-2, the novel coronavirus associated with COVID-19.  A negative result does not rule out COVID-19.     This test has been authorized by the FDA under an Emergency Use Authorization (EUA) for use by authorized laboratories.      Fact sheet for Healthcare Providers: FlickSafe.gl  Fact sheet for Patients: [place].at     Methodology: Isothermal Nucleic Acid Amplification          Influenza A, NAA Not detected        Comment: Negative results do not preclude infection with influenza virus and should not be the sole basis of a patient treatment decision.        Influenza B, NAA Not detected       Blood Culture 1 [[id number]] Collected: 11/25/23 1101    Order Status: Completed Specimen: Blood Updated: 11/27/23 0725     Special Requests --        RIGHT  Antecubital       Culture NO GROWTH 2 DAYS               All Labs from Last 24 Hrs:  Recent Results (from the past 24 hours)   Lactic Acid    Collection Time: 11/26/23 10:58 AM   Result Value Ref Range    Lactic Acid  2.5 (H) 0.5 - 2.0 mmol/L   Potassium    Collection Time: 11/26/23  6:16 PM   Result Value Ref Range    Potassium 3.3 (L) 3.5 - 5.1 mmol/L   Lactic Acid    Collection Time: 11/26/23  6:16 PM   Result Value Ref Range    Lactic Acid 1.9 0.5 - 2.0 mmol/L   Echo (TTE) complete (PRN contrast/bubble/strain/3D)  Collection Time: 11/26/23  6:26 PM   Result Value Ref Range    LV EDV A2C 59 mL    LV EDV A4C 46 mL    LV ESV A2C 21 mL    LV ESV A4C 17 mL    IVSd 1.0 (A) 0.6 - 0.9 cm    LVIDd 4.6 3.9 - 5.3 cm    LVIDs 2.9 cm    LVOT Peak Velocity 1.6 m/s    LVOT Peak Gradient 10 mmHg    LVPWd 0.9 0.6 - 0.9 cm    LV E' Lateral Velocity 10.90 cm/s    LV E' Septal Velocity 7.64 cm/s    LV Ejection Fraction A2C 65 %    LV Ejection Fraction A4C 63 %    EF BP 64 55 - 100 %    LA Minor Axis 4.1 cm    LA Major Axis 4.8 cm    LA Area 2C 10.4 cm2    LA Area 4C 15.6 cm2    LA Volume MOD A2C 22 22 - 52 mL    LA Volume MOD A4C 37 22 - 52 mL    LA Volume BP 30 22 - 52 mL    LA Diameter 2.8 cm    AV Peak Velocity 1.7 m/s    AV Peak Gradient 12 mmHg    Aortic Root 2.6 cm    Ascending Aorta 3.0 cm    Aortic Sinus Valsalva 2.8 cm    MV E Wave Deceleration Time 187.0 ms    MV A Velocity 0.93 m/s    MV E Velocity 0.93 m/s    PR Max Velocity 0.9 m/s    Pulmonary Artery EDP 3 mmHg    Pulm Vein Peak D Velocity 0.8 m/s    Pulm Vein Peak S Velocity 0.6 m/s    Pulm Vein S/D 0.8 no units    RV Basal Dimension 2.7 cm    RV Mid Dimension 2.4 cm    TAPSE 2.0 >=1.7 cm    TR Max Velocity 2.04 m/s    TR Peak Gradient 17 mmHg    Body Surface Area 1.85 m2    Fractional Shortening 2D 37 28 - 44 %    LV ESV Index A4C 10 mL/m2    LV EDV Index A4C 26 mL/m2    LV ESV Index A2C 12 mL/m2    LV EDV Index A2C 33 mL/m2    LVIDd Index 2.58 cm/m2    LVIDs Index 1.63 cm/m2    LV RWT Ratio 0.39     LV Mass 2D 148.1 67 - 162 g    LV Mass 2D Index 83.2 43 - 95 g/m2    MV E/A 1.00     E/E' Ratio (Averaged) 10.35     E/E' Lateral 8.53     E/E' Septal 12.17     LA Volume Index BP  17 16 - 34 ml/m2    LA Volume Index MOD A2C 12 (A) 16 - 34 ml/m2    LA Volume Index MOD A4C 21 16 - 34 ml/m2    LA Size Index 1.57 cm/m2    LA/AO Root Ratio 1.08     Ao Root Index 1.46 cm/m2    Aortic Sinus Valsalva Index 1.57 cm/m2    Ascending Aorta Index 1.69 cm/m2    AV Velocity Ratio 0.94     EF Physician 65 %   CBC with Auto Differential    Collection Time: 11/27/23  8:48 AM   Result Value Ref Range    WBC 40.5 (H) 4.3 - 11.1 K/uL    RBC 4.41 4.05 - 5.2 M/uL    Hemoglobin 14.2 11.7 - 15.4 g/dL    Hematocrit 54.3 64.1 - 46.3 %    MCV 103.4 (H) 82.0 - 102.0 FL    MCH 32.2 26.1 - 32.9 PG    MCHC 31.1 (L) 31.4 - 35.0 g/dL    RDW 85.6 88.0 - 85.3 %    Platelets 156 150 - 450 K/uL    MPV 9.9 9.4 - 12.3 FL    nRBC 0.04 0.0 - 0.2 K/uL    Differential Type PENDING    Basic Metabolic Panel w/ Reflex to MG    Collection Time: 11/27/23  8:48 AM   Result Value Ref Range    Sodium 139 136 - 145 mmol/L    Potassium 4.2 3.5 - 5.1 mmol/L    Chloride 103 98 - 107 mmol/L    CO2 26 20 - 29 mmol/L    Anion Gap 10 7 - 16 mmol/L    Glucose 142 (H) 70 - 99 mg/dL    BUN 16 8 - 23 MG/DL    Creatinine 9.48 (L) 0.60 - 1.10 MG/DL    Est, Glom Filt Rate >90 >60 ml/min/1.89m2    Calcium 7.8 (L) 8.8 - 10.2 MG/DL       Recent Labs     90/69/74  2115   COVID19 NOT DETECTED       Recent Vital Data:  Patient Vitals for the past 24 hrs:   Temp Pulse Resp BP SpO2   11/27/23 0753 -- 75 -- -- 95 %   11/27/23 0700 97.9 F (36.6 C) -- -- -- --   11/27/23 0600 -- 75 13 (!) 89/65 94 %   11/27/23 0500 -- 78 11 98/62 96 %   11/27/23 0438 -- 86 17 -- 90 %   11/27/23 0429 -- 86 28 -- (!) 88 %   11/27/23 0400 -- 89 25 -- (!) 88 %   11/27/23 0358 -- -- -- -- (!) 87 %   11/27/23 0354 -- -- -- -- (!) 89 %   11/27/23 0351 -- -- -- -- (!) 89 %   11/27/23 0350 -- -- -- -- (!) 89 %   11/27/23 0300 98.1 F (36.7 C) 83 25 104/64 91 %   11/27/23 0200 -- 75 14 (!) 87/51 92 %   11/27/23 0100 -- 72 14 (!) 84/47 93 %   11/27/23 0013 -- 78 13 -- 92 %   11/27/23 0000 --  74 18 104/70 93 %   11/26/23 2300 97.9 F (36.6 C) 79 15 104/63 92 %   11/26/23 2200 -- 80 16 99/68 --   11/26/23 2118 -- 81 13 -- 93 %   11/26/23 2100 -- 81 13 108/80 95 %   11/26/23 2005 -- 84 13 -- 95 %   11/26/23 2000 -- 85 14 (!) 82/62 95 %   11/26/23 1930 97.8 F (36.6 C) 95 14 109/82 94 %   11/26/23 1900 -- 93 25 116/78 90 %   11/26/23 1810 -- 87 22 (!) 115/59 100 %   11/26/23 1740 -- 84 22 -- --   11/26/23 1700 -- 81 17 116/74 --   11/26/23 1620 -- 82 17 -- 95 %   11/26/23 1600 97.4 F (36.3 C) 80 15 113/61 94 %   11/26/23 1500 -- 78 15  103/64 94 %   11/26/23 1400 -- 81 16 105/65 95 %   11/26/23 1300 -- 78 15 (!) 103/58 97 %   11/26/23 1200 -- 77 15 99/62 96 %   11/26/23 1100 97.3 F (36.3 C) 81 17 (!) 95/54 95 %   11/26/23 1051 -- -- 19 -- --   11/26/23 1021 -- 89 24 -- 93 %   11/26/23 1018 -- 91 26 107/65 94 %   11/26/23 1000 -- 92 28 (!) 83/59 92 %       Oxygen Therapy  SpO2: 95 %  Pulse Oximeter Device Mode: Continuous  Pulse via Oximetry: 72 beats per minute  Pulse Oximeter Device Location: Forehead  O2 Device: Heated high flow cannula  O2 Flow Rate (L/min): 50 L/min  FiO2 : 80 %  Blood Gas  Performed?: Yes  Allen's Test #1: Collateral flow confirmed  Site #1: Right Radial  Site Prepped #1: Yes  Number of Attempts #1: 1  Pressure Held #1: Yes  Complications #1: None  Post-procedure #1: Standard  Specimen Status #1: Point of care  How Tolerated?: Tolerated well    Estimated body mass index is 34.24 kg/m as calculated from the following:    Height as of this encounter: 1.524 m (5').    Weight as of this encounter: 79.5 kg (175 lb 4.8 oz).    Intake/Output Summary (Last 24 hours) at 11/27/2023 0929  Last data filed at 11/27/2023 0420  Gross per 24 hour   Intake 801.06 ml   Output 1950 ml   Net -1148.94 ml         Physical Exam:  General:          Alert and oriented x 3, more fatigued, work of breathing has increased  Head:               Normocephalic, atraumatic  Eyes:               Sclerae appear  normal.  Pupils equally round.  ENT:                Nares appear normal.  Moist oral mucosa. HHNC in place 95% FiO2/50L  Neck:               No restricted ROM.  Trachea midline   CV:                  RRR.  No jugular venous distension.  Lungs:             Coarse breath sounds, diminished at the bases.  No wheezing.  Symmetric expansion.  Abdomen:        Soft, nontender, nondistended.  Obese  Extremities:     No cyanosis or clubbing.  No edema  Skin:                No rashes.  Normal coloration.   Warm and dry.    Neuro:             CN II-XII grossly intact.  Sensation intact.   Psych:             Mood and affect appropriate         Time spent in patient discharge and coordination 55 minutes.      Signed:  Mitzie [place], DO    Part of this note may have been written by using a voice dictation software.  The note has been proof read but may  still contain some grammatical/other typographical errors.

## 2023-11-27 NOTE — Progress Notes (Signed)
 Dr.J.Moody made aware of  SBP of mid 80's. Orders received. See Mar.

## 2023-11-27 NOTE — Progress Notes (Signed)
 Ventilator check complete; patient has a #7.5 ET tube secured at the 22 at the lip.  Patient is  sedated.  Patient is not able to follow commands.  Breath sounds are diminished.  Trachea is midline, Negative for subcutaneous air, and chest excursion is symmetric. Patient is also Negative for cyanosis and is Negative for pitting edema.  All alarms are set and audible.  Resuscitation bag is  at the head of the bed.      Ventilator Settings  Mode FIO2 Rate Tidal Volume Pressure PEEP I:E Ratio   AC/PRVC  70%  21 bpm  400 mL       8 cm H2O        2029- decreased fio2 from 70% to 60%    Burr Moats, RCP

## 2023-11-27 NOTE — Anesthesia Pre-Procedure Evaluation (Signed)
 Department of Anesthesiology  Preprocedure Note       Name:  Sandra Harmon   Age:  68 y.o.  DOB:  02/15/1956                                          MRN:  [id number]         Date:  11/27/2023      Surgeon: * No surgeons listed *    Procedure: * No procedures listed *    Medications prior to admission:   Prior to Admission medications   Medication Sig Start Date End Date Taking? Authorizing Provider   levothyroxine  (SYNTHROID ) 75 MCG tablet Take 1 tablet by mouth every morning (before breakfast) 10/02/23  Yes Cothran-Pate, Sonya L, APRN - CNP   albuterol  sulfate HFA (PROVENTIL ;VENTOLIN ;PROAIR ) 108 (90 Base) MCG/ACT inhaler Inhale 2 puffs into the lungs every 6 hours as needed for Wheezing 10/02/23  Yes Cothran-Pate, Sonya L, APRN - CNP   sertraline  (ZOLOFT ) 50 MG tablet Take 1 tablet by mouth daily  Patient not taking: Reported on 11/25/2023 10/02/23   Cothran-Pate, Sonya L, APRN - CNP   pravastatin  (PRAVACHOL ) 20 MG tablet Take 1 tablet by mouth daily  Patient not taking: Reported on 11/25/2023 10/02/23   Cothran-Pate, Sonya L, APRN - CNP   alendronate  (FOSAMAX ) 70 MG tablet Take 1 tablet by mouth every 7 days  Patient not taking: Reported on 11/25/2023 10/02/23   Cothran-Pate, Sonya L, APRN - CNP   baclofen  (LIORESAL ) 10 MG tablet Take 1 tablet by mouth 2 times daily  Patient not taking: Reported on 11/25/2023 10/02/23   Cothran-Pate, Sonya L, APRN - CNP   predniSONE (DELTASONE) 5 MG tablet Take 1 tablet by mouth daily  Patient not taking: Reported on 11/25/2023 06/10/23   [provider]   aspirin 81 MG chewable tablet Take by mouth daily  Patient not taking: Reported on 11/25/2023    Automatic Reconciliation, Ar       Current medications:    Current Facility-Administered Medications   Medication Dose Route Frequency Provider Last Rate Last Admin   . piperacillin-tazobactam (ZOSYN) 3,375 mg in sodium chloride 0.9 % 50 mL IVPB (addEASE)  3,375 mg IntraVENous Q8H Darrel Fairy SQUIBB, DO   Stopped at 11/27/23 1318   .  hydrocortisone sodium succinate PF (SOLU-CORTEF) 50 mg in sterile water 2 mL injection  50 mg IntraVENous Q6H McCoy, Mitzie Faden, DO   50 mg at 11/27/23 0859   . vancomycin (VANCOCIN) 1,000 mg in sodium chloride 0.9 % 250 mL IVPB (Vial2Bag)  1,000 mg IntraVENous Q12H McCoy, Mitzie Faden, DO       . medicated lip ointment (BLISTEX)   Topical PRN Guduru, Ramana, MD   7 g at 11/26/23 0617   . sulfur hexafluoride microspheres (LUMASON) 60.7-25 MG injection 2 mL  2 mL IntraVENous ONCE PRN McCoy, Mitzie Faden, DO       . levothyroxine  (SYNTHROID ) tablet 75 mcg  75 mcg Oral QAM AC McCoy, Chelsea Faden, DO   75 mcg at 11/27/23 0851   . sodium chloride flush 0.9 % injection 5-40 mL  5-40 mL IntraVENous 2 times per day Leron Mitzie Faden, DO   10 mL at 11/27/23 0920   . sodium chloride flush 0.9 % injection 5-40 mL  5-40 mL IntraVENous PRN McCoy, Mitzie Faden, DO       .  0.9 % sodium chloride infusion   IntraVENous PRN Leron Mitzie Faden, DO 50 mL/hr at 11/26/23 9376 Rate Verify at 11/26/23 9376   . potassium chloride (KLOR-CON M) extended release tablet 40 mEq  40 mEq Oral PRN McCoy, Mitzie Faden, DO        Or   . potassium bicarb-citric acid (EFFER-K) effervescent tablet 40 mEq  40 mEq Oral PRN McCoy, Mitzie Faden, DO   40 mEq at 11/26/23 1929    Or   . potassium chloride 10 mEq/100 mL IVPB (Peripheral Line)  10 mEq IntraVENous PRN Leron Mitzie Faden, DO 100 mL/hr at 11/26/23 1515 10 mEq at 11/26/23 1515   . magnesium sulfate 2000 mg in 50 mL IVPB premix  2,000 mg IntraVENous PRN McCoy, Mitzie Faden, DO       . ondansetron (ZOFRAN-ODT) disintegrating tablet 4 mg  4 mg Oral Q8H PRN McCoy, Mitzie Faden, DO        Or   . ondansetron (ZOFRAN) injection 4 mg  4 mg IntraVENous Q6H PRN McCoy, Mitzie Faden, DO       . polyethylene glycol (GLYCOLAX) packet 17 g  17 g Oral Daily PRN McCoy, Mitzie Faden, DO       . pantoprazole (PROTONIX) 40 mg in sodium chloride (PF) 0.9 % 10 mL injection   40 mg IntraVENous Daily McCoy, Mitzie Faden, DO   40 mg at 11/27/23 9147   . ipratropium 0.5 mg-albuterol  2.5 mg (DUONEB) nebulizer solution 1 Dose  1 Dose Inhalation Q4H PRN Leron Mitzie Faden, DO   1 Dose at 11/25/23 2133   . enoxaparin (LOVENOX) injection 40 mg  40 mg SubCUTAneous Daily McCoy, Mitzie Faden, DO   40 mg at 11/27/23 0849   . oxyCODONE-acetaminophen  (PERCOCET) 5-325 MG per tablet 1 tablet  1 tablet Oral Q6H PRN Guduru, Ramana, MD   1 tablet at 11/27/23 1126       Allergies:    Allergies   Allergen Reactions   . Atorvastatin Other (See Comments)     Muscle pain   . Hydrocodone-Acetaminophen  Itching   . Penicillins Hives       Problem List:    Patient Active Problem List   Diagnosis Code   . Lightheadedness R42   . Hx-TIA (transient ischemic attack) Z86.73   . Syncope, vasovagal R55   . Altered mental status, unspecified R41.82   . Encounter for medication management Z79.899   . Chest pain R07.9   . Hypomagnesemia E83.42   . 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 Palpitations R00.2   . Anxiety F41.9   . 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 Adjustment disorder with anxiety F43.22   . Chronic bronchitis, unspecified chronic bronchitis type (HCC) J42   . Community acquired pneumonia, bilateral J18.9   . Acute hypoxic respiratory failure (HCC) J96.01   . Severe sepsis (HCC) A41.9, R65.20   . Hypokalemia E87.6   . Lactic acidosis E87.20   . Pneumonia of both lungs due to infectious organism J18.9   . Acute respiratory failure with hypoxia (HCC) J96.01       Past Medical History:        Diagnosis Date   . Arthritis    . Chronic back pain    . Colon polyp 2015    Had 5 polyps negative for cancer   . Hypertension    . Hyperthyroidism    . Kidney stone     Only happened  once   .  Liver disease    . Thyroid disease     hypothyroid       Past Surgical History:        Procedure Laterality Date   . APPENDECTOMY     . CERVICAL DISCECTOMY  09/16/06    Anterior C5-C6 diskectomy with  decompression;  anterior cervical arthrodesis interbody technique;  anterior cervical instrumentation;  tricortical iliac crest bone graft harvested through a separate incision.    . CHOLECYSTECTOMY     . DIAGNOSTIC CARDIAC CATH LAB PROCEDURE     . HYSTERECTOMY, VAGINAL  1988   . TAH AND BSO (CERVIX REMOVED)     . TUBAL LIGATION         Social History:    Social History     Tobacco Use   . Smoking status: Former     Current packs/day: 0.00     Average packs/day: 1 pack/day for 17.7 years (17.7 ttl pk-yrs)     Types: Cigarettes     Start date: 02/26/1979     Quit date: 10/25/1996     Years since quitting: 27.1     Passive exposure: Never   . Smokeless tobacco: Former   Substance Use Topics   . Alcohol use: No                                Counseling given: Not Answered      Vital Signs (Current):   Vitals:    11/27/23 1200 11/27/23 1203 11/27/23 1222 11/27/23 1400   BP: (!) 93/52 92/77 108/62    Pulse: 77 81 82    Resp: 17 21 21     Temp:       TempSrc:       SpO2: 90% 92%  (!) 67%   Weight:       Height:                                                  BP Readings from Last 3 Encounters:   11/27/23 108/62   10/02/23 126/80   07/08/23 (!) 130/91       NPO Status:                                                                                 BMI:   Wt Readings from Last 3 Encounters:   11/27/23 79.5 kg (175 lb 4.8 oz)   10/02/23 83.9 kg (185 lb)   07/08/23 80.7 kg (178 lb)     Body mass index is 34.24 kg/m.    CBC:   Lab Results   Component Value Date/Time    WBC 40.5 11/27/2023 08:48 AM    RBC 4.41 11/27/2023 08:48 AM    HGB 14.2 11/27/2023 08:48 AM    HCT 45.6 11/27/2023 08:48 AM    MCV 103.4 11/27/2023 08:48 AM    RDW 14.3 11/27/2023 08:48 AM    PLT 156 11/27/2023 08:48 AM       CMP:  Lab Results   Component Value Date/Time    NA 139 11/27/2023 08:48 AM    K 4.2 11/27/2023 08:48 AM    CL 103 11/27/2023 08:48 AM    CO2 26 11/27/2023 08:48 AM    BUN 16 11/27/2023 08:48 AM    CREATININE 0.51 11/27/2023 08:48 AM     GFRAA >60 09/13/2020 03:47 PM    AGRATIO 2.0 05/23/2020 04:33 PM    LABGLOM >90 11/27/2023 08:48 AM    LABGLOM >60 03/28/2022 08:42 AM    LABGLOM 102 05/23/2020 04:33 PM    GLUCOSE 142 11/27/2023 08:48 AM    GLUCOSE 157 07/30/2022 10:03 AM    CALCIUM 7.8 11/27/2023 08:48 AM    BILITOT 3.4 11/25/2023 11:02 AM    ALKPHOS 101 11/25/2023 11:02 AM    ALKPHOS 88 05/23/2020 04:33 PM    AST 67 11/25/2023 11:02 AM    ALT 35 11/25/2023 11:02 AM       POC Tests: No results for input(s): POCGLU, POCNA, POCK, POCCL, POCBUN, POCHEMO, POCHCT in the last 72 hours.    Coags: No results found for: PROTIME, INR, APTT    HCG (If Applicable): No results found for: PREGTESTUR, PREGSERUM, HCG, HCGQUANT     ABGs: No results found for: PHART, PO2ART, PCO2ART, HCO3ART, BEART, O2SATART     Type & Screen (If Applicable):  No results found for: ABORH, LABANTI    Drug/Infectious Status (If Applicable):  Lab Results   Component Value Date/Time    HEPCAB NONREACTIVE 03/28/2022 08:42 AM       COVID-19 Screening (If Applicable):   Lab Results   Component Value Date/Time    COVID19 NOT DETECTED 11/25/2023 09:15 PM           Anesthesia Evaluation    Patient summary reviewed and Nursing notes reviewed      Airway:  Mallampati: II  TM distance: >3 FB   Neck ROM: full    Mouth opening: > = 3 FB   Dental:     (+) poor dentition      Pulmonary:   Negative Pulmonary ROS and normal exam   breath sounds clear to auscultation  (+) pneumonia:        shortness of breath (Hyoxia and acidosis with PNA present): new and recurrent                              Cardiovascular:     Exercise tolerance: good (>4 METS)    (+)     hypertension:                                            Rhythm: regular  Rate: normal             ROS comment: Preserved EF on echo     Neuro/Psych:    Negative Neuro/Psych ROS  (+)     TIA (Remote)                     GI/Hepatic/Renal:    (+)       liver disease:  :            Endo/Other:  Negative  Endo/Other ROS                    Abdominal:  Vascular:  negative vascular ROS.       Other Findings:            Anesthesia Plan      TIVA     ASA 4 - emergent       Induction: intravenous.      Anesthetic plan and risks discussed with patient.                      Donnice KATHEE Blander, MD   11/27/2023

## 2023-11-27 NOTE — Care Coordination-Inpatient (Signed)
 Pt chart reviewed and discussed in Critical Care Interdisciplinary Rounds. LOS 2 days. Pt admitted with severe sepsis, hypokalemia, acute respiratory failure with hypoxia, bilateral pneumonia due to infectious organism, CHF, and CAP. Per MD, pulmonology consulted; started on steriods yesterday; hypertensive overnight; AirVo 50L 80%; potential bronch; potential need for intubation pending clinical improvement; potential transfer downtown if intubated. Pt not medically stable for discharge.     DC/POC pending clinical course.     CM team will continue to follow for potential discharge needs that may arise.     Evalene Must, MSW, LBSW  ICU Social Paramedic. [place]

## 2023-11-27 NOTE — Progress Notes (Signed)
 Ventilator check complete; patient has a #7.5 ET tube secured at the 21 at the lip.  Patient is  sedated.  Patient is not able to follow commands.  Breath sounds are coarse and crackles.  Trachea is midline, Negative for subcutaneous air, and chest excursion is symmetric. Patient is also Negative for cyanosis and is Negative for pitting edema.  All alarms are set and audible.  Resuscitation bag is  at the head of the bed.      Ventilator Settings  Mode FIO2 Rate Tidal Volume Pressure PEEP I:E Ratio   AC/PRVC  70 % (Weaned from 100% due to ABG results.)   21 400    8 1:2.0        ELIZABETH LONG, RCP

## 2023-11-28 LAB — BASIC METABOLIC PANEL W/ REFLEX TO MG FOR LOW K
Anion Gap: 9 mmol/L (ref 7–16)
BUN: 20 mg/dL (ref 8–23)
CO2: 28 mmol/L (ref 20–29)
Calcium: 7.6 mg/dL — ABNORMAL LOW (ref 8.8–10.2)
Chloride: 102 mmol/L (ref 98–107)
Creatinine: 0.58 mg/dL — ABNORMAL LOW (ref 0.60–1.10)
Est, Glom Filt Rate: >90 ml/min/1.73m2 (ref >60)
Glucose: 155 mg/dL — ABNORMAL HIGH (ref 70–99)
Potassium: 3.3 mmol/L — ABNORMAL LOW (ref 3.5–5.1)
Sodium: 139 mmol/L (ref 136–145)

## 2023-11-28 LAB — ARTERIAL BLOOD GAS, POC
Base Excess: 7.9 mmol/L
FIO2: 60 %
IPAP/PIP/High PEEP: 32
Inspiratory Time: 0.85 s
MEAN AIRWAY PRESSURE: 15 cmH2O
POC Allen's Test: POSITIVE
POC HCO3: 32.6 MMOL/L — ABNORMAL HIGH (ref 21–28)
POC O2 SAT: 95.7 % (ref 94–98)
POC PEEP: 8 cmH2O
POC PO2: 75 mmHg (ref 75–100)
POC TIDAL VOLUME: 400 mL
POC pCO2: 44.8 mmHg (ref 35–45)
POC pH: 7.47 — ABNORMAL HIGH (ref 7.35–7.45)

## 2023-11-28 LAB — CBC WITH AUTO DIFFERENTIAL
Basophils %: 0.1 % (ref 0.0–2.0)
Basophils Absolute: 0.06 K/UL (ref 0.00–0.20)
Eosinophils %: 0 % — ABNORMAL LOW (ref 0.5–7.8)
Eosinophils Absolute: 0 K/UL (ref 0.00–0.80)
Hematocrit: 36.6 % (ref 35.8–46.3)
Hemoglobin: 11.8 g/dL (ref 11.7–15.4)
Immature Granulocytes %: 11.3 % — ABNORMAL HIGH (ref 0.0–5.0)
Immature Granulocytes Absolute: 6.72 K/UL — ABNORMAL HIGH (ref 0.0–0.5)
Lymphocytes %: 7.8 % — ABNORMAL LOW (ref 13.0–44.0)
Lymphocytes Absolute: 4.64 K/UL — ABNORMAL HIGH (ref 0.50–4.60)
MCH: 31.5 pg (ref 26.1–32.9)
MCHC: 32.2 g/dL (ref 31.4–35.0)
MCV: 97.6 FL (ref 82–102)
MPV: 9.8 FL (ref 9.4–12.3)
Monocytes %: 2 % — ABNORMAL LOW (ref 4.0–12.0)
Monocytes Absolute: 1.19 K/UL (ref 0.10–1.30)
Neutrophils %: 78.8 % — ABNORMAL HIGH (ref 43.0–78.0)
Neutrophils Absolute: 46.89 K/UL — ABNORMAL HIGH (ref 1.70–8.20)
Platelet Comment: ADEQUATE
Platelets: 187 K/uL (ref 150–450)
RBC: 3.75 M/uL — ABNORMAL LOW (ref 4.05–5.2)
RDW: 14 % (ref 11.9–14.6)
WBC: 59.5 K/uL (ref 4.3–11.1)
nRBC: 0.15 K/uL (ref 0.0–0.2)

## 2023-11-28 LAB — PHOSPHORUS: Phosphorus: 1.4 mg/dL — ABNORMAL LOW (ref 2.5–4.5)

## 2023-11-28 LAB — POTASSIUM: Potassium: 4 mmol/L (ref 3.5–5.1)

## 2023-11-28 LAB — MAGNESIUM: Magnesium: 2.1 mg/dL (ref 1.8–2.4)

## 2023-11-28 MED ORDER — SODIUM CHLORIDE 3 % IN NEBU
3 | Freq: Three times a day (TID) | RESPIRATORY_TRACT | Status: DC
Start: 2023-11-28 — End: 2023-12-03
  Administered 2023-11-28 – 2023-12-03 (×16): 4 mL via RESPIRATORY_TRACT

## 2023-11-28 MED ORDER — THIAMINE HCL 100 MG PO TABS
100 | Freq: Every day | ORAL | Status: DC
Start: 2023-11-28 — End: 2023-12-03
  Administered 2023-11-28 – 2023-12-03 (×6): 100 mg via OROGASTRIC

## 2023-11-28 MED ORDER — FOLIC ACID 1 MG PO TABS
1 | Freq: Every day | ORAL | Status: DC
Start: 2023-11-28 — End: 2023-12-03
  Administered 2023-11-28 – 2023-12-03 (×6): 1 mg via OROGASTRIC

## 2023-11-28 MED FILL — SOLU-CORTEF 100 MG IJ SOLR: 100 mg | INTRAMUSCULAR | Qty: 2 | Fill #0

## 2023-11-28 MED FILL — POTASSIUM CHLORIDE 20 MEQ/50ML IV SOLN: 20 MEQ/50ML | INTRAVENOUS | Qty: 50 | Fill #0

## 2023-11-28 MED FILL — CEFEPIME HCL 2 G IV SOLR: 2 g | INTRAVENOUS | Qty: 2 | Fill #0

## 2023-11-28 MED FILL — FOLIC ACID 1 MG PO TABS: 1 mg | ORAL | Qty: 1 | Fill #0

## 2023-11-28 MED FILL — IPRATROPIUM-ALBUTEROL 0.5-2.5 (3) MG/3ML IN SOLN: 0.5-2.5 (3) MG/3ML | RESPIRATORY_TRACT | Qty: 3 | Fill #0

## 2023-11-28 MED FILL — LEVOTHYROXINE SODIUM 75 MCG PO TABS: 75 ug | ORAL | Qty: 1 | Fill #0

## 2023-11-28 MED FILL — VANCOMYCIN HCL 1 G IV SOLR: 1 g | INTRAVENOUS | Qty: 1000 | Fill #0

## 2023-11-28 MED FILL — DIPRIVAN 1000 MG/100ML IV EMUL: 1000 MG/100ML | INTRAVENOUS | Qty: 100 | Fill #0

## 2023-11-28 MED FILL — SODIUM CHLORIDE 3 % IN NEBU: 3 % | RESPIRATORY_TRACT | Qty: 4 | Fill #0

## 2023-11-28 MED FILL — FENTANYL CITRATE 1000 MCG/100ML IV SOLN: 1000 MCG/100ML | INTRAVENOUS | Qty: 100 | Fill #0

## 2023-11-28 MED FILL — THIAMINE HCL 100 MG PO TABS: 100 mg | ORAL | Qty: 1 | Fill #0

## 2023-11-28 MED FILL — PANTOPRAZOLE SODIUM 40 MG IV SOLR: 40 mg | INTRAVENOUS | Qty: 40 | Fill #0

## 2023-11-28 MED FILL — LOVENOX 40 MG/0.4ML IJ SOSY: 40 MG/0.4ML | INTRAMUSCULAR | Qty: 0.4 | Fill #0

## 2023-11-28 NOTE — Progress Notes (Addendum)
 "  [facility] Critical Care Note:: 11/28/2023  Sandra Harmon  Admission Date: 11/27/2023     Length of Stay: 1 days    Background: 68 y.o. Caucasian female with PMH significant for HLD, hypothyroidism, obesity who was seen and evaluated at the request of Dr. Leron after presentation on 9/30 for chest pain/shortness of breath. On arrival to ED, patient was noted to be hypoxic to mid 80s on RA and placed on HHFNC 40L/60%.  Initial labs indicated lactic 3.4, Pro-Cal 1.9. WBC 13.5. Chest x-ray notable for diffuse bilateral infiltrates. ABG after being placed on CPAP 7.52/26/52/21. Admitted to ICU on HHFNC with increasing requirements over the past 24 hours. Pulmonary consulted for assistance AHRF and severe pneumonia.      Patient previously seen by Dr. Parker in 2024 for chronic shortness of breath of unclear etiology after COVID infection. PFTs previously with mild restriction. Had been trialed on Symbicort /albuterol .      Patient seen and examined this morning with son at bedside who provides clinical history. He reports she was feeling ill starting on Friday with fatigue, malaise and vomiting. 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 He denies her having fevers or chills. No reported hemoptysis.      Decided to transfer patient downtown due to worsening oxygen needs.  Upon EMS arrival, she dropped her O2 sats to ~60% and was intubated prior to transport.      Notable PMH:  has a past medical history of Arthritis, Chronic back pain, Colon polyp, Hypertension, Hyperthyroidism, Kidney stone, Liver disease, and Thyroid disease.    24 Hour events: remains on vent, 50%, levophed  on 3. WBC down to 38.     Review of Systems: Unable to obtain due to patient factors.      Lines: (insertion date)   ETT  (Active)     NG/OG/NJ/NE Tube Center mouth (Active)       Urinary Catheter 11/25/23 Foley (Active)     CVC  11/27/23 Right Internal jugular (Active)      Drips: current dose (range)  Dose (mcg/kg/min) Propofol  : 15 mcg/kg/min  Dose (mcg/hr) Fentanyl : 50 mcg/hr  Dose (mcg/min) Norepinephrine : 4 mcg/min     Pertinent Exam:         Blood pressure (!) 109/52, pulse 65, temperature 98.3 F (36.8 C), temperature source Axillary, resp. rate 19, height 1.524 m (5'), weight 81.4 kg (179 lb 7.3 oz), SpO2 95%.   Intake/Output Summary (Last 24 hours) at 11/28/2023 0853  Last data filed at 11/28/2023 9361  Gross per 24 hour   Intake 1727.85 ml   Output 600 ml   Net 1127.85 ml     Constitutional:  currently sedated, intubated.  Critically ill.   EENMT:  Sclera clear, pupils equal, oral mucosa moist  Respiratory: BLS coarse rhonchi, ETT at 21cm at lip.   Cardiovascular:  RRR, no murmur  Gastrointestinal:  soft with no tenderness; positive bowel sounds present  Musculoskeletal:  warm with no cyanosis, no lower extremity edema  Skin:  no jaundice or ecchymosis  Neurologic:sedated, intubated.  Cough with dep suction.   Psychiatric: sedated    CXR: 10/4        Recent Labs     11/27/23  0848 11/28/23  0325 11/29/23  0536   WBC 40.5* 59.5* 38.0*   HGB 14.2 11.8 12.0   HCT 45.6 36.6 36.1   PLT 156 187 177  Recent Labs     11/25/23  1102 11/26/23  0306 11/26/23  1816 11/27/23  0848 11/28/23  0325   NA 132* 137  --  139 139   K 2.9* 2.8* 3.3* 4.2 3.3*   CL 95* 102  --  103 102   CO2 22 23  --  26 28   GLUCOSE 122* 118*  --  142* 155*   BUN 12 14  --  16 20   CREATININE 0.74 0.69  --  0.51* 0.58*   MG 1.9 1.9  --   --  2.1   BILITOT 3.4*  --   --   --   --    AST 67*  --   --   --   --    ALT 35  --   --   --   --    ALKPHOS 101  --   --   --   --      No results for input(s): TROPHS, NTPROBNP, CRP, ESR in the last 72 hours.  Recent Labs     11/26/23  0306 11/27/23  0848 11/28/23  0325   GLUCOSE 118* 142* 155*      ECHO: 11/25/23    ECHO (TTE) COMPLETE (PRN CONTRAST/BUBBLE/STRAIN/3D) 11/27/2023  8:12 AM (Final)    Interpretation Summary    Left Ventricle: Normal left  ventricular systolic function with a visually estimated EF of 65 - 70%. Left ventricle size is normal. Mild septal thickening. Normal wall motion. Normal diastolic function.    Right Ventricle: Right ventricle size is normal.  TAPSE is 2.0 cm. RV Basal Dimension is 2.7 cm. RV Mid Dimension is 2.4 cm. Normal systolic function.    Image quality is fair.    Signed by: Dorn Debby Finder, DO on 11/27/2023  8:12 AM    Microbiology:   Recent Labs     11/25/23  1113 11/27/23  0927 11/27/23  1811   CULTURE NO GROWTH 3 DAYS LIGHT NORMAL RESPIRATORY FLORA No growth after short period of incubation. Further results to follow after overnight incubation.     Ventilator Settings Ideal body weight: 45.5 kg (100 lb 4.9 oz)  Adjusted ideal body weight: 59.9 kg (131 lb 15.5 oz)  Mode FIO2 Rate Tidal Volume Pressure   AC/PRVC    (S) 55 % (weaned)  (S) 19 bpm (weaned based off ABG)         8     Peak airway pressure:     Minute ventilation:    ABG:  Recent Labs     11/25/23  1041 11/27/23  1612 11/28/23  0404   BE 0.0 3.8 7.9     Assessment and Plan:  (Medical Decision Making)   Impression: 68 y.o. female admitted 11/27/2023 for Acute respiratory failure with hypoxia (HCC). Admitted with SOB, cough found to have multifocal PNA, planning [place] and desaturated requiring intubation.    NEURO:   Sedation: propofol  for now, daily sedation break  Analgesia: fentanyl  gtt, daily sedation break    CV:   Shock: suspect septic, resuscitation, hemodynamic monitoring, vasopressors weaning    PULM:   Acute Hypoxemic and Hypercapneic Respiratory failure: LTVV, wean per [employer]. Considered diuretics, but will hold one more day, ideally, get off vasopressors and BUN improve and diurese.   Pneumonia: empiric antibiotics, cultures and bronch pending. PCN allergy, on cefepime .  D/C Vanc, MRSA negative, WBC improved.   --continue hydrocortisone  50mg  q6  Chronic dyspnea: had been on  symbicort  in past.  No wheezing currently, so would hold on  inhaled steroids.  Albuterol , 3% for now.    RENAL:  Electrolytes: replace PRN    GI:   Nutrition: NGT placement. Will start TF tomorrow if still intubated and vasopressors low and stable.     HEME:   Anemia: resuscitation, monitor  Leukocytosis: improved levophed  dose and WBC, no fevers.     ID:   Multifocal PNA: on cefepime , vanc.  Wean as cultures return.  Blood cultures with no growth from 9/30. Bronch with broad cultures sent.     ENDO:   Hyperglycemia: mild on BMP.  Monitor with daily labs for now.  With steroids, may need to monitor more closely    Skin: no decub, turns, preventive care  Prophy: Lovenox     Son at bedside and updated.  Name Home Phone Work Phone Mobile Phone Relationship Tamsen Pickler   MYNIZD,FJUU [phone]  [phone] Child    DONAL SOBER [phone]  [phone] Daughter-in* No     Full Code    The patient is critically ill with respiratory failure, circulatory failure and requires high complexity decision making for assessment and support including frequent ventilator adjustment , frequent evaluation and titration of therapies , application of advanced monitoring technologies and extensive interpretation of multiple databases    Cumulative time devoted to patient care services by me for day of service -30 min     Bari Lessen, MD  "

## 2023-11-28 NOTE — Progress Notes (Signed)
"  Ventilator check complete; patient has a #7.5 ET tube secured at the 21 at the lip.  Patient is  sedated.  Patient is not able to follow commands.  Breath sounds are rhonchi.  Trachea is midline, Negative for subcutaneous air, and chest excursion is symmetric. Patient is also Negative for cyanosis and is Negative for pitting edema.  All alarms are set and audible.  Resuscitation bag is with mask is at the head of the bed.      Ventilator Settings  Mode FIO2 Rate Tidal Volume  PEEP I:E Ratio   AC/PRVC  (S) 55 % (weaned) 19 bpm 400 mL  8 cmc H20 1:2.7      Peak airway pressure: 33 cm H20  Minute ventilation: 8.3 L      Signe Moats, RCP   "

## 2023-11-28 NOTE — Consults (Signed)
 "Nutrition Assessment  Assessment Type: Initial, Consult  Reason for visit:  Tube Feeding Management (Pulmonology)    Nutrition Intervention:   Food and/or Nutrient Delivery:   Enteral Nutrition:   Enteral Access: Orogastric  Initiate  Formula: Peptide Based High Protein (Vital High Protein)  Goal Rate: Continuous 50 ml/hr  Initiate  Water  flush  30 ml every 4 hours  Modulars: None not indicated at this time  Enteral regimen at above goal to provide per 24 hours:  1100 calories, 96 grams protein and free fluid.    Above regimen: Intended to meet macronutrient goals  Biochemical Data:  Basic Metabolic Panel, Magnesium  and Phosphorus active per nutrition parameters  POC Glucoses/SSI Not indicated   Nutrition Related Medication Management:  Electrolyte Replacement:   Initiate prn protocol Phosphorus   Continue Mg and K   Intravenous fluids: Not applicable  Vitamin and Mineral Supplements:  Thiamine  100 mg daily x 7 days (EOT 10/9)  Folic Acid  1 mg daily x 7 days (EOT 10/9)  Bowel Regimen: Active prn on MAR  Coordination of Nutrition Care:  Coordination with health care provider RN, [facility]     Malnutrition Assessment:  [facility] Clinical Malnutrition Criteria  Malnutrition Status: At risk for malnutrition    Nutrition Focused Physical Exam: visual NFPE unremarkable    Nutrition Assessment:  Food/Nutrition Related History: Son reports pt was not eating well for a few days to a week pta.          Weight History: He endorses wt loss. Per EMR wt hx review of family medicine office visits 11/5 180lb, 2/4 180lb, 4/1 185lb, 5/7 181lb, 8/7 185lb.   Nutrition Background:       PMH significant for HLD, hypothyroidism, osteoporosis, left kidney mass, and obesity. Pt admitted to [facility] 9/30 with severe sepsis, acute respiratory failure, bilateral community acquired pneumonia, hypokalemia, and lactic acidosis. She was intubated ust prior to transfer to [facility] on 10/2.   Nutrition Monitoring/Evaluation:  OGT placed 10/2     Pt  seen with son present.     Abdominal Status (last documented by nursing):    ,     Pertinent Medications: maxipime , suolucortef, protonix ,   Continuous: fentanyl , levo, propofol  (current infusion provides 193kcal/d)  IVF: none  Electrolyte Replacement:  10/3: KCl  Pertinent administered PRN: none  Pertinent Labs:   Lab Results   Component Value Date/Time    NA 139 11/28/2023 03:25 AM    K 4.0 11/28/2023 08:34 AM    CL 102 11/28/2023 03:25 AM    CO2 28 11/28/2023 03:25 AM    BUN 20 11/28/2023 03:25 AM    CREATININE 0.58 11/28/2023 03:25 AM    GLUCOSE 155 11/28/2023 03:25 AM    GLUCOSE 157 07/30/2022 10:03 AM    CALCIUM 7.6 11/28/2023 03:25 AM    MG 2.1 11/28/2023 03:25 AM      Remarkable for: K improved s/p replacement, BG mildly elevated     Current Ordered Nutrition Therapies:  Diet NPO    Current Intake:   Average Meal Intake: NPO        Anthropometric Measures:  Height: 152.4 cm (5')  Current Body Wt: 86 kg (189 lb 9.5 oz) (10/3), Weight source: Bed scale  BMI: 37, Obese Class 2 (BMI 35.0 -39.9)  Admission Body Weight: 81.4 kg (179 lb 7.3 oz) (10/2 bedscale)  Ideal Body Weight (Kg) (Calculated): 45 kg (100 lbs), 189.6 %  BMI Category Obese Class 2 (BMI 35.0 -39.9)  Comparative  Standards:  Energy (kcal/day): [phone] (11-14 kcal/kg) (Kcal/kg Weight used: 86 kg Current  Protein (g/day): 90-113 (2-2.5g/kg) Weight Used: (Ideal) 45 kg  Fluid (ml/day):   (1 ml/kcal)    Nutrition Diagnosis:   Inadequate oral intake related to impaired respiratory function as evidenced by NPO or clear liquid status due to medical condition, intubation    Nutrition Goal(s):      Active Goal: Tolerate nutrition support at goal rate, by next RD assessment       Discharge Planning:    Too soon to determine    Damien Labrum, RD    "

## 2023-11-28 NOTE — Interdisciplinary Rounds (Signed)
"  Multi-D Rounds/Checklist (leapfrog):  Lines: can any be removed?: None   ETT  (Active)     NG/OG/NJ/NE Tube Center mouth (Active)       Urinary Catheter 11/25/23 Foley (Active)     CVC  11/27/23 Right Internal jugular (Active)     DVT Prophylaxis: Ordered  Vent: HOB elevated? Yes ;  mL/kg: 8 ; Vent day 2  Nutrition Ordered/appropriate: Ordered - will  Can antibiotics or other drugs be stopped? Stop Vanc No End Date set Yes   MRSA swab: negative  Inpat Anti-Infectives (From admission, onward)       Start     Ordered Stop    11/28/23 0030  ceFEPIme  (MAXIPIME ) 2,000 mg in sodium chloride  0.9 % 100 mL IVPB (addEASE)  2,000 mg,   IntraVENous,   EVERY 8 HOURS         11/27/23 1555 12/05/23 0029    11/27/23 2100  vancomycin  (VANCOCIN ) 1,000 mg in sodium chloride  0.9 % 250 mL IVPB (Vial2Bag)  1,000 mg,   IntraVENous,   EVERY 12 HOURS         11/27/23 1600 12/30/23 2059                  Consults needed: dietary  A: Is pain control adequate? (has PRNs? Stop drip?) Yes  B: Sedation break and SBT? Yes  C: Is sedation choice appropriate? Yes  D: Delirium/CAM-ICU? Unable to screen  E: Mobility goals/appropriateness? Yes  F: Family update and plan? Son is runner, broadcasting/film/video and is being updated daily by primary attending and nursing staff.    Katheryn Speed, APRN - CNP  "

## 2023-11-29 ENCOUNTER — Inpatient Hospital Stay: Admit: 2023-11-29 | Payer: Medicare (Managed Care)

## 2023-11-29 LAB — CBC WITH AUTO DIFFERENTIAL
Basophils %: 0.1 % (ref 0.0–2.0)
Basophils Absolute: 0.04 K/UL (ref 0.00–0.20)
Eosinophils %: 0.1 % — ABNORMAL LOW (ref 0.5–7.8)
Eosinophils Absolute: 0.04 K/UL (ref 0.00–0.80)
Hematocrit: 36.1 % (ref 35.8–46.3)
Hemoglobin: 12 g/dL (ref 11.7–15.4)
Immature Granulocytes %: 10 % — ABNORMAL HIGH (ref 0.0–5.0)
Immature Granulocytes Absolute: 3.8 K/UL — ABNORMAL HIGH (ref 0.0–0.5)
Lymphocytes %: 11.7 % — ABNORMAL LOW (ref 13.0–44.0)
Lymphocytes Absolute: 4.45 K/UL (ref 0.50–4.60)
MCH: 32.6 pg (ref 26.1–32.9)
MCHC: 33.2 g/dL (ref 31.4–35.0)
MCV: 98.1 FL (ref 82–102)
MPV: 10 FL (ref 9.4–12.3)
Monocytes %: 2.1 % — ABNORMAL LOW (ref 4.0–12.0)
Monocytes Absolute: 0.8 K/UL (ref 0.10–1.30)
Neutrophils %: 76 % (ref 43.0–78.0)
Neutrophils Absolute: 28.88 K/UL — ABNORMAL HIGH (ref 1.70–8.20)
Platelet Comment: ADEQUATE
Platelets: 177 K/uL (ref 150–450)
RBC: 3.68 M/uL — ABNORMAL LOW (ref 4.05–5.2)
RDW: 14 % (ref 11.9–14.6)
WBC Comment: ABNORMAL
WBC: 38 K/uL — ABNORMAL HIGH (ref 4.3–11.1)
nRBC: 0.23 K/uL — ABNORMAL HIGH (ref 0.0–0.2)

## 2023-11-29 LAB — BASIC METABOLIC PANEL
Anion Gap: 7 mmol/L (ref 7–16)
BUN: 27 mg/dL — ABNORMAL HIGH (ref 8–23)
CO2: 28 mmol/L (ref 20–29)
Calcium: 8.2 mg/dL — ABNORMAL LOW (ref 8.8–10.2)
Chloride: 107 mmol/L (ref 98–107)
Creatinine: 0.59 mg/dL — ABNORMAL LOW (ref 0.60–1.10)
Est, Glom Filt Rate: >90 ml/min/1.73m2 (ref >60)
Glucose: 151 mg/dL — ABNORMAL HIGH (ref 70–99)
Potassium: 3.8 mmol/L (ref 3.5–5.1)
Sodium: 142 mmol/L (ref 136–145)

## 2023-11-29 LAB — ARTERIAL BLOOD GAS, POC
Base Excess: 8.6 mmol/L
FIO2: 50 %
MEAN AIRWAY PRESSURE: 19 cmH2O
POC Allen's Test: POSITIVE
POC HCO3: 33.2 MMOL/L — ABNORMAL HIGH (ref 21–28)
POC O2 SAT: 92 % — ABNORMAL LOW (ref 94–98)
POC PEEP: 8 cmH2O
POC PO2: 60 mmHg — ABNORMAL LOW (ref 75–100)
POC TIDAL VOLUME: 400 mL
POC pCO2: 44.6 mmHg (ref 35–45)
POC pH: 7.48 — ABNORMAL HIGH (ref 7.35–7.45)

## 2023-11-29 LAB — MAGNESIUM: Magnesium: 2.6 mg/dL — ABNORMAL HIGH (ref 1.8–2.4)

## 2023-11-29 LAB — PHOSPHORUS
Phosphorus: 1.6 mg/dL — ABNORMAL LOW (ref 2.5–4.5)
Phosphorus: 2.6 mg/dL (ref 2.5–4.5)

## 2023-11-29 MED FILL — SOLU-CORTEF 100 MG IJ SOLR: 100 mg | INTRAMUSCULAR | Qty: 2 | Fill #0

## 2023-11-29 MED FILL — LEVOTHYROXINE SODIUM 75 MCG PO TABS: 75 ug | ORAL | Qty: 1 | Fill #0

## 2023-11-29 MED FILL — SODIUM PHOSPHATES 15 MMOLE/5ML IV SOLN: 15 MMOLE/5ML | INTRAVENOUS | Qty: 5 | Fill #0

## 2023-11-29 MED FILL — NOREPINEPHRINE BITARTRATE 8 MG/250 ML (32 MCG/ML) IN 0.9 % NACL IV: 8-0.9 MG/250ML-% | INTRAVENOUS | Qty: 250 | Fill #0

## 2023-11-29 MED FILL — SODIUM CHLORIDE 3 % IN NEBU: 3 % | RESPIRATORY_TRACT | Qty: 4 | Fill #0

## 2023-11-29 MED FILL — IPRATROPIUM-ALBUTEROL 0.5-2.5 (3) MG/3ML IN SOLN: 0.5-2.5 (3) MG/3ML | RESPIRATORY_TRACT | Qty: 3 | Fill #0

## 2023-11-29 MED FILL — FOLIC ACID 1 MG PO TABS: 1 mg | ORAL | Qty: 1 | Fill #0

## 2023-11-29 MED FILL — CEFEPIME HCL 2 G IV SOLR: 2 g | INTRAVENOUS | Qty: 2 | Fill #0

## 2023-11-29 MED FILL — LIP-GUARD EX OINT: CUTANEOUS | Qty: 7 | Fill #0

## 2023-11-29 MED FILL — FENTANYL CITRATE 1000 MCG/100ML IV SOLN: 1000 MCG/100ML | INTRAVENOUS | Qty: 100 | Fill #0

## 2023-11-29 MED FILL — DIPRIVAN 1000 MG/100ML IV EMUL: 1000 MG/100ML | INTRAVENOUS | Qty: 100 | Fill #0

## 2023-11-29 MED FILL — PANTOPRAZOLE SODIUM 40 MG IV SOLR: 40 mg | INTRAVENOUS | Qty: 40 | Fill #0

## 2023-11-29 MED FILL — LOVENOX 40 MG/0.4ML IJ SOSY: 40 MG/0.4ML | INTRAMUSCULAR | Qty: 0.4 | Fill #0

## 2023-11-29 MED FILL — THIAMINE HCL 100 MG PO TABS: 100 mg | ORAL | Qty: 1 | Fill #0

## 2023-11-29 NOTE — Progress Notes (Addendum)
 "  [facility] Critical Care Note:: 11/29/2023  Sandra Harmon  Admission Date: 11/27/2023     Length of Stay: 3 days    Background: 68 y.o. Caucasian female with PMH significant for HLD, hypothyroidism, obesity who was seen and evaluated at the request of Dr. Leron after presentation on 9/30 for chest pain/shortness of breath. On arrival to ED, patient was noted to be hypoxic to mid 80s on RA and placed on HHFNC 40L/60%.  Initial labs indicated lactic 3.4, Pro-Cal 1.9. WBC 13.5. Chest x-ray notable for diffuse bilateral infiltrates. ABG after being placed on CPAP 7.52/26/52/21. Admitted to ICU on HHFNC with increasing requirements over the past 24 hours. Pulmonary consulted for assistance AHRF and severe pneumonia.      Patient previously seen by Dr. Parker in 2024 for chronic shortness of breath of unclear etiology after COVID infection. PFTs previously with mild restriction. Had been trialed on Symbicort /albuterol .      Patient seen and examined this morning with son at bedside who provides clinical history. He reports she was feeling ill starting on Friday with fatigue, malaise and vomiting. 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 He denies her having fevers or chills. No reported hemoptysis.      Decided to transfer patient downtown due to worsening oxygen needs.  Upon EMS arrival, she dropped her O2 sats to ~60% and was intubated prior to transport.      Notable PMH:  has a past medical history of Arthritis, Chronic back pain, Colon polyp, Hypertension, Hyperthyroidism, Kidney stone, Liver disease, and Thyroid disease.    24 Hour events: remains on vent, 50%, levophed  on 3. WBC down to 38.     Review of Systems: Unable to obtain due to patient factors.      Lines: (insertion date)   ETT  (Active)     NG/OG/NJ/NE Tube Center mouth (Active)       Urinary Catheter 11/25/23 Foley (Active)     CVC  11/27/23 Right Internal jugular (Active)      Drips: current dose (range)  Dose (mcg/kg/min) Propofol  : 15 mcg/kg/min  Dose (mcg/hr) Fentanyl : 25 mcg/hr  Dose (mcg/min) Norepinephrine : 3 mcg/min     Pertinent Exam:         Blood pressure (!) 120/50, pulse 63, temperature 98.7 F (37.1 C), temperature source Axillary, resp. rate 18, height 1.524 m (5'), weight 81.4 kg (179 lb 7.3 oz), SpO2 91%.   Intake/Output Summary (Last 24 hours) at 11/30/2023 0918  Last data filed at 11/30/2023 0400  Gross per 24 hour   Intake 1158.02 ml   Output 1025 ml   Net 133.02 ml     Constitutional:  currently sedated, intubated.  Critically ill.   EENMT:  Sclera clear, pupils equal, oral mucosa moist  Respiratory: BLS coarse rhonchi, ETT at 21cm at lip.   Cardiovascular:  RRR, no murmur  Gastrointestinal:  soft with no tenderness; positive bowel sounds present  Musculoskeletal:  warm with no cyanosis, no lower extremity edema  Skin:  no jaundice or ecchymosis  Neurologic:sedated, intubated.  Cough with dep suction.   Psychiatric: sedated    CXR: 10/4        Recent Labs     11/28/23  0325 11/29/23  0536 11/30/23  0433   WBC 59.5* 38.0* 35.1*   HGB 11.8 12.0 11.6*   HCT 36.6 36.1 35.6*   PLT 187 177 169  Recent Labs     11/28/23  0325 11/28/23  0834 11/29/23  0536 11/29/23  1800 11/30/23  0433   NA 139  --  142  --  146*   K 3.3* 4.0 3.8  --  4.2   CL 102  --  107  --  111*   CO2 28  --  28  --  28   GLUCOSE 155*  --  151*  --  177*   BUN 20  --  27*  --  31*   CREATININE 0.58*  --  0.59*  --  0.59*   MG 2.1  --  2.6*  --  2.5*   PHOS 1.4*  --  1.6* 2.6 2.3*     No results for input(s): TROPHS, NTPROBNP, CRP, ESR in the last 72 hours.  Recent Labs     11/28/23  0325 11/29/23  0536 11/30/23  0433   GLUCOSE 155* 151* 177*      ECHO: 11/25/23    ECHO (TTE) COMPLETE (PRN CONTRAST/BUBBLE/STRAIN/3D) 11/27/2023  8:12 AM (Final)    Interpretation Summary    Left Ventricle: Normal left ventricular systolic function with a visually estimated EF of 65 - 70%. Left ventricle size is  normal. Mild septal thickening. Normal wall motion. Normal diastolic function.    Right Ventricle: Right ventricle size is normal.  TAPSE is 2.0 cm. RV Basal Dimension is 2.7 cm. RV Mid Dimension is 2.4 cm. Normal systolic function.    Image quality is fair.    Signed by: Dorn Debby Finder, DO on 11/27/2023  8:12 AM    Microbiology:   Recent Labs     11/27/23  0927 11/27/23  1811   CULTURE MODERATE NORMAL RESPIRATORY FLORA SCANT NORMAL RESPIRATORY FLORA     Ventilator Settings Ideal body weight: 45.5 kg (100 lb 4.9 oz)  Adjusted ideal body weight: 59.9 kg (131 lb 15.5 oz)  Mode FIO2 Rate Tidal Volume Pressure   CPAP/PS (Weaned from RR.)    55 %  18 bpm         10     Peak airway pressure:     Minute ventilation:    ABG:  Recent Labs     11/28/23  0404 11/29/23  0428 11/30/23  0412   BE 7.9 8.6 7.3     Assessment and Plan:  (Medical Decision Making)   Impression: 68 y.o. female admitted 11/27/2023 for Acute respiratory failure with hypoxia (HCC). Admitted with SOB, cough found to have multifocal PNA, [place] and desaturated requiring intubation.    NEURO:   Sedation: propofol  for now, daily sedation break  Analgesia: fentanyl  gtt, daily sedation break    CV:   Shock: suspect septic, resuscitation, hemodynamic monitoring, vasopressors weaning    PULM:   Acute Hypoxemic and Hypercapneic Respiratory failure: LTVV, wean per [employer]. Considered diuretics, but will hold one more day, ideally, get off vasopressors and BUN improve and diurese.   Pneumonia: empiric antibiotics, cultures and bronch pending. PCN allergy, on cefepime .  D/C Vanc, MRSA negative, WBC improved.   --continue hydrocortisone  50mg  q6  Chronic dyspnea: had been on symbicort  in past.  No wheezing currently, so would hold on inhaled steroids.  Albuterol , 3% for now.    RENAL:  Electrolytes: replace PRN    GI:   Nutrition: NGT placement. Will start TF tomorrow if still intubated and vasopressors low and stable.     HEME:   Anemia:  resuscitation, monitor  Leukocytosis: improved  levophed  dose and WBC, no fevers.     ID:   Multifocal PNA: on cefepime , vanc.  Wean as cultures return.  Blood cultures with no growth from 9/30. Bronch with broad cultures sent.     ENDO:   Hyperglycemia: mild on BMP.  Monitor with daily labs for now.  With steroids, may need to monitor more closely    Skin: no decub, turns, preventive care  Prophy: Lovenox     Son at bedside and updated.  Name Home Phone Work Phone Mobile Phone Relationship Sandra Harmon   MYNIZD,FJUU [phone]  [phone] Child    DONAL SOBER [phone]  [phone] Daughter-in* No     Full Code    The patient is critically ill with respiratory failure, circulatory failure and requires high complexity decision making for assessment and support including frequent ventilator adjustment , frequent evaluation and titration of therapies , application of advanced monitoring technologies and extensive interpretation of multiple databases    Cumulative time devoted to patient care services by me for day of service -30 min     Bari Lessen, MD  "

## 2023-11-29 NOTE — Plan of Care (Signed)
"    Problem: Discharge Planning  Goal: Discharge to home or other facility with appropriate resources  11/29/2023 1402 by Augustus Zurawski, RN  Outcome: Not Progressing  11/29/2023 0356 by Almarie Kins, RN  Outcome: Progressing     "

## 2023-11-29 NOTE — Progress Notes (Signed)
"  Ventilator check complete; patient has a #7.5 ET tube secured at the 22 at the lip.  Patient is  sedated.  Patient is not able to follow commands.  Breath sounds are diminished.  Trachea is midline, Negative for subcutaneous air, and chest excursion is symmetric. Patient is also Negative for cyanosis and is Negative for pitting edema.  All alarms are set and audible.  Resuscitation bag is  at the head of the bed.      Ventilator Settings  Mode FIO2 Rate Tidal Volume Pressure PEEP I:E Ratio   AC/PRVC  50 %  19 bpm  400 mL        8 cm H2O       2013- HME and suction ballard changed at this time. Large amount of bloody secretions from pts mouth.    0429- ABG completed - fio2 increased from 50% to 55%    Burr Moats, RCP  "

## 2023-11-29 NOTE — Progress Notes (Addendum)
"  Ventilator check complete; patient has a #7.5 ET tube secured at the 22 at the lip.  Patient is  sedated.  Patient is not able to follow commands.  Breath sounds are crackles and diminished.  Trachea is midline, Negative for subcutaneous air, and chest excursion is symmetric. Patient is also Negative for cyanosis and is Negative for pitting edema.  All alarms are set and audible.  Resuscitation bag is  at the head of the bed.      Ventilator Settings  Mode FIO2 Rate Tidal Volume  PEEP I:E Ratio   AC/PRVC  55 % 18 bpm 400 mL  10 cm H20 1:2.9      Peak airway pressure: 24 cm H20  Minute ventilation: 8.1 L      Signe Moats, RCP   "

## 2023-11-29 NOTE — Progress Notes (Signed)
"  Ventilator check complete; patient has a #7.5 ET tube secured at the 22 at the lip.  Patient is  sedated.  Patient is not able to follow commands.  Breath sounds are diminished.  Trachea is midline, Negative for subcutaneous air, and chest excursion is symmetric. Patient is also Negative for cyanosis and is Negative for pitting edema.  All alarms are set and audible.  Resuscitation bag is  at the head of the bed.      Ventilator Settings  Mode FIO2 Rate Tidal Volume Pressure PEEP I:E Ratio   AC/PRVC  55 % 18   400    10 1:2.9      Peak airway pressure:   27 cm H2O  Minute ventilation:   6.6 l/min        Fairy Miyamoto, RCP    "

## 2023-11-30 ENCOUNTER — Inpatient Hospital Stay: Admit: 2023-11-30 | Payer: Medicare (Managed Care)

## 2023-11-30 LAB — CULTURE, RESPIRATORY (WITH GRAM STAIN): Culture: NORMAL

## 2023-11-30 LAB — BASIC METABOLIC PANEL
Anion Gap: 7 mmol/L (ref 7–16)
BUN: 31 mg/dL — ABNORMAL HIGH (ref 8–23)
CO2: 28 mmol/L (ref 20–29)
Calcium: 8.3 mg/dL — ABNORMAL LOW (ref 8.8–10.2)
Chloride: 111 mmol/L — ABNORMAL HIGH (ref 98–107)
Creatinine: 0.59 mg/dL — ABNORMAL LOW (ref 0.60–1.10)
Est, Glom Filt Rate: >90 ml/min/1.73m2 (ref >60)
Glucose: 177 mg/dL — ABNORMAL HIGH (ref 70–99)
Potassium: 4.2 mmol/L (ref 3.5–5.1)
Sodium: 146 mmol/L — ABNORMAL HIGH (ref 136–145)

## 2023-11-30 LAB — CBC WITH AUTO DIFFERENTIAL
Basophils %: 0.4 % (ref 0.0–2.0)
Basophils Absolute: 0.14 K/UL (ref 0.00–0.20)
Eosinophils %: 0 % — ABNORMAL LOW (ref 0.5–7.8)
Eosinophils Absolute: 0 K/UL (ref 0.00–0.80)
Hematocrit: 35.6 % — ABNORMAL LOW (ref 35.8–46.3)
Hemoglobin: 11.6 g/dL — ABNORMAL LOW (ref 11.7–15.4)
Immature Granulocytes %: 7.7 % — ABNORMAL HIGH (ref 0.0–5.0)
Immature Granulocytes Absolute: 2.7 K/UL — ABNORMAL HIGH (ref 0.0–0.5)
Lymphocytes %: 11.1 % — ABNORMAL LOW (ref 13.0–44.0)
Lymphocytes Absolute: 3.9 K/UL (ref 0.50–4.60)
MCH: 33.3 pg — ABNORMAL HIGH (ref 26.1–32.9)
MCHC: 32.6 g/dL (ref 31.4–35.0)
MCV: 102.3 FL — ABNORMAL HIGH (ref 82–102)
MPV: 10.4 FL (ref 9.4–12.3)
Monocytes %: 1.8 % — ABNORMAL LOW (ref 4.0–12.0)
Monocytes Absolute: 0.63 K/UL (ref 0.10–1.30)
Neutrophils %: 79 % — ABNORMAL HIGH (ref 43.0–78.0)
Neutrophils Absolute: 27.73 K/UL — ABNORMAL HIGH (ref 1.70–8.20)
Platelet Comment: ADEQUATE
Platelets: 169 K/uL (ref 150–450)
RBC: 3.48 M/uL — ABNORMAL LOW (ref 4.05–5.2)
RDW: 14.1 % (ref 11.9–14.6)
WBC: 35.1 K/uL — ABNORMAL HIGH (ref 4.3–11.1)
nRBC: 0.27 K/uL — ABNORMAL HIGH (ref 0.0–0.2)

## 2023-11-30 LAB — MAGNESIUM: Magnesium: 2.5 mg/dL — ABNORMAL HIGH (ref 1.8–2.4)

## 2023-11-30 LAB — LEGIONELLA ANTIGEN, URINE: L pneumophila S1 Ag, Ur: NEGATIVE

## 2023-11-30 LAB — ARTERIAL BLOOD GAS, POC
Base Excess: 7.3 mmol/L
FIO2: 55 %
POC Allen's Test: POSITIVE
POC HCO3: 33 MMOL/L — ABNORMAL HIGH (ref 21–28)
POC O2 SAT: 96.4 % (ref 94–98)
POC PEEP: 10 cmH2O
POC PO2: 85 mmHg (ref 75–100)
POC TIDAL VOLUME: 400 mL
POC pCO2: 50.2 mmHg — ABNORMAL HIGH (ref 35–45)
POC pH: 7.43 (ref 7.35–7.45)
Respiratory Rate: 22

## 2023-11-30 LAB — PROCALCITONIN: Procalcitonin: 0.35 ng/mL — ABNORMAL HIGH (ref 0.00–0.10)

## 2023-11-30 LAB — C-REACTIVE PROTEIN: CRP: 5.7 mg/dL — ABNORMAL HIGH (ref 0.0–0.4)

## 2023-11-30 LAB — CULTURE, BLOOD 1: Culture: NO GROWTH

## 2023-11-30 LAB — PHOSPHORUS: Phosphorus: 2.3 mg/dL — ABNORMAL LOW (ref 2.5–4.5)

## 2023-11-30 LAB — CULTURE, BLOOD 2: Culture: NO GROWTH

## 2023-11-30 LAB — BRAIN NATRIURETIC PEPTIDE: NT Pro-BNP: 551 pg/mL — ABNORMAL HIGH (ref 0–125)

## 2023-11-30 MED ORDER — IOPAMIDOL 76 % IV SOLN
76 | Freq: Once | INTRAVENOUS | Status: AC | PRN
Start: 2023-11-30 — End: 2023-11-30
  Administered 2023-11-30: 18:00:00 100 mL via INTRAVENOUS

## 2023-11-30 MED ORDER — AZITHROMYCIN 500 MG IV SOLR
500 | INTRAVENOUS | Status: AC
Start: 2023-11-30 — End: 2023-12-02
  Administered 2023-11-30 – 2023-12-02 (×3): 500 mg via INTRAVENOUS

## 2023-11-30 MED ORDER — METHYLPREDNISOLONE SODIUM SUCC 125 MG IJ SOLR
125 | Freq: Three times a day (TID) | INTRAMUSCULAR | Status: DC
Start: 2023-11-30 — End: 2023-12-03
  Administered 2023-11-30 – 2023-12-03 (×10): 60 mg via INTRAVENOUS

## 2023-11-30 MED FILL — CEFEPIME HCL 2 G IV SOLR: 2 g | INTRAVENOUS | Qty: 2

## 2023-11-30 MED FILL — SOLU-CORTEF 100 MG IJ SOLR: 100 mg | INTRAMUSCULAR | Qty: 2

## 2023-11-30 MED FILL — LOVENOX 40 MG/0.4ML IJ SOSY: 40 MG/0.4ML | INTRAMUSCULAR | Qty: 0.4

## 2023-11-30 MED FILL — FENTANYL CITRATE 1000 MCG/100ML IV SOLN: 1000 MCG/100ML | INTRAVENOUS | Qty: 100

## 2023-11-30 MED FILL — FOLIC ACID 1 MG PO TABS: 1 mg | ORAL | Qty: 1

## 2023-11-30 MED FILL — DIPRIVAN 1000 MG/100ML IV EMUL: 1000 MG/100ML | INTRAVENOUS | Qty: 100

## 2023-11-30 MED FILL — SODIUM CHLORIDE 3 % IN NEBU: 3 % | RESPIRATORY_TRACT | Qty: 4

## 2023-11-30 MED FILL — METHYLPREDNISOLONE SODIUM SUCC 125 MG IJ SOLR: 125 mg | INTRAMUSCULAR | Qty: 125

## 2023-11-30 MED FILL — SOLU-CORTEF 100 MG IJ SOLR: 100 mg | INTRAMUSCULAR | Qty: 2 | Fill #0

## 2023-11-30 MED FILL — IPRATROPIUM-ALBUTEROL 0.5-2.5 (3) MG/3ML IN SOLN: 0.5-2.5 (3) MG/3ML | RESPIRATORY_TRACT | Qty: 3

## 2023-11-30 MED FILL — THIAMINE HCL 100 MG PO TABS: 100 mg | ORAL | Qty: 1

## 2023-11-30 MED FILL — NOREPINEPHRINE BITARTRATE 8 MG/250 ML (32 MCG/ML) IN 0.9 % NACL IV: 8-0.9 MG/250ML-% | INTRAVENOUS | Qty: 250

## 2023-11-30 MED FILL — LEVOTHYROXINE SODIUM 75 MCG PO TABS: 75 ug | ORAL | Qty: 1

## 2023-11-30 MED FILL — PANTOPRAZOLE SODIUM 40 MG IV SOLR: 40 mg | INTRAVENOUS | Qty: 40

## 2023-11-30 MED FILL — AZITHROMYCIN 500 MG IV SOLR: 500 mg | INTRAVENOUS | Qty: 500

## 2023-11-30 NOTE — Progress Notes (Signed)
"  Ventilator check complete; patient has a #7.5 ET tube secured at the 21 at the lip.  Patient is  sedated.  Patient is not able to follow commands.  Breath sounds are diminished.  Trachea is midline, Negative for subcutaneous air, and chest excursion is symmetric. Patient is also Negative for cyanosis and is Negative for pitting edema.  All alarms are set and audible.  Resuscitation bag is  at the head of the bed.      Ventilator Settings  Mode FIO2 Rate Tidal Volume Pressure PEEP I:E Ratio   AC/PRVC  55 %   18 400  10 1:2.9      Peak airway pressure:   35 cm H2O  Minute ventilation:   8.7 l/min        Fairy Miyamoto, RCP    "

## 2023-11-30 NOTE — Plan of Care (Signed)
 "  Problem: Discharge Planning  Goal: Discharge to home or other facility with appropriate resources  11/30/2023 0308 by Almarie Kins, RN  Outcome: Progressing  11/29/2023 1402 by Medina, Jalyn, RN  Outcome: Not Progressing     Problem: Safety - Adult  Goal: Free from fall injury  11/30/2023 0308 by Almarie Kins, RN  Outcome: Progressing  11/29/2023 1402 by Medina, Jalyn, RN  Outcome: Progressing     Problem: Pain  Goal: Verbalizes/displays adequate comfort level or baseline comfort level  11/30/2023 0308 by Almarie Kins, RN  Outcome: Progressing  11/29/2023 1402 by Medina, Jalyn, RN  Outcome: Progressing     Problem: Safety - Medical Restraint  Goal: Remains free of injury from restraints (Restraint for Interference with Medical Device)  Description: INTERVENTIONS:  1. Determine that other, less restrictive measures have been tried or would not be effective before applying the restraint  2. Evaluate the patient's condition at the time of restraint application  3. Inform patient/family regarding the reason for restraint  4. Q2H: Monitor safety, psychosocial status, comfort, nutrition and hydration  11/30/2023 0308 by Almarie Kins, RN  Outcome: Progressing  11/29/2023 1402 by Medina, Jalyn, RN  Outcome: Progressing     Problem: Skin/Tissue Integrity  Goal: Skin integrity remains intact  Description: 1.  Monitor for areas of redness and/or skin breakdown  2.  Assess vascular access sites hourly  3.  Every 4-6 hours minimum:  Change oxygen saturation probe site  4.  Every 4-6 hours:  If on nasal continuous positive airway pressure, respiratory therapy assess nares and determine need for appliance change or resting period  11/30/2023 0308 by Almarie Kins, RN  Outcome: Progressing  11/29/2023 1402 by Medina, Jalyn, RN  Outcome: Progressing     Problem: Neurosensory - Adult  Goal: Achieves maximal functionality and self care  Outcome: Progressing     Problem: Respiratory - Adult  Goal: Achieves optimal ventilation and oxygenation  Outcome:  Progressing     Problem: Cardiovascular - Adult  Goal: Maintains optimal cardiac output and hemodynamic stability  Outcome: Progressing     Problem: Skin/Tissue Integrity - Adult  Goal: Skin integrity remains intact  Description: 1.  Monitor for areas of redness and/or skin breakdown  2.  Assess vascular access sites hourly  3.  Every 4-6 hours minimum:  Change oxygen saturation probe site  4.  Every 4-6 hours:  If on nasal continuous positive airway pressure, respiratory therapy assess nares and determine need for appliance change or resting period  11/30/2023 0308 by Almarie Kins, RN  Outcome: Progressing  11/29/2023 1402 by Medina, Jalyn, RN  Outcome: Progressing     Problem: Musculoskeletal - Adult  Goal: Return ADL status to a safe level of function  Outcome: Progressing     Problem: Gastrointestinal - Adult  Goal: Maintains adequate nutritional intake  Outcome: Progressing     Problem: Genitourinary - Adult  Goal: Urinary catheter remains patent  Outcome: Progressing     Problem: Infection - Adult  Goal: Absence of infection at discharge  Outcome: Progressing     Problem: Metabolic/Fluid and Electrolytes - Adult  Goal: Electrolytes maintained within normal limits  Outcome: Progressing     Problem: Coping  Goal: Pt/Family able to verbalize concerns and demonstrate effective coping strategies  Description: INTERVENTIONS:  1. Assist patient/family to identify coping skills, available support systems and cultural and spiritual values  2. Provide emotional support, including active listening and acknowledgement of concerns of patient and caregivers  3.  Reduce environmental stimuli, as able  4. Instruct patient/family in relaxation techniques, as appropriate  5. Assess for spiritual pain/suffering and initiate Spiritual Care, Psychosocial Clinical Specialist consults as needed  Outcome: Progressing     Problem: Discharge Planning  Goal: Discharge to home or other facility with appropriate resources  11/30/2023 0308 by  Almarie Kins, RN  Outcome: Progressing  11/29/2023 1402 by Medina, Jalyn, RN  Outcome: Not Progressing     "

## 2023-11-30 NOTE — Plan of Care (Signed)
"    Problem: Respiratory - Adult  Goal: Achieves optimal ventilation and oxygenation  11/30/2023 0806 by Darra Norris, RCP  Outcome: Progressing  Flowsheets (Taken 11/30/2023 0806)  Achieves optimal ventilation and oxygenation:   Assess for changes in respiratory status   Position to facilitate oxygenation and minimize respiratory effort   Assess the need for suctioning and aspirate as needed   Respiratory therapy support as indicated   Assess for changes in mentation and behavior   Oxygen supplementation based on oxygen saturation or arterial blood gases   Encourage broncho-pulmonary hygiene including cough, deep breathe, incentive spirometry   Assess and instruct to report shortness of breath or any respiratory difficulty     "

## 2023-11-30 NOTE — Progress Notes (Signed)
"  Ventilator check complete; patient has a #7.5 ET tube secured at the 21 at the lip.  Patient is  sedated.  Patient is not able to follow commands.  Breath sounds are diminished.  Trachea is midline, Negative for subcutaneous air, and chest excursion is symmetric. Patient is also Negative for cyanosis and is Negative for pitting edema.  All alarms are set and audible.  Resuscitation bag is  at the head of the bed.      Ventilator Settings  Mode FIO2 Rate Tidal Volume Pressure PEEP I:E Ratio   AC/PRVC (Placed back on RR for patient to rest.)  55% 20 400    10   1:3      Peak airway pressure:   32   Minute ventilation:   8.29    ELIZABETH LONG, RCP    "

## 2023-11-30 NOTE — Progress Notes (Signed)
 "  [facility] [place] Critical Care Note:: 11/30/2023  Sandra Harmon  Admission Date: 11/27/2023     Length of Stay: 3 days    Background: 68 y.o. Caucasian female with PMH significant for HLD, hypothyroidism, obesity who was seen and evaluated at the request of Dr. Leron after presentation on 9/30 for chest pain/shortness of breath. On arrival to ED, patient was noted to be hypoxic to mid 80s on RA and placed on HHFNC 40L/60%.  Initial labs indicated lactic 3.4, Pro-Cal 1.9. WBC 13.5. Chest x-ray notable for diffuse bilateral infiltrates. ABG after being placed on CPAP 7.52/26/52/21. Admitted to ICU on HHFNC with increasing requirements over the past 24 hours. Pulmonary consulted for assistance AHRF and severe pneumonia.      Patient previously seen by Dr. Aurora in 2024 for chronic shortness of breath of unclear etiology after COVID infection. PFTs previously with mild restriction. Had been trialed on Symbicort /albuterol .      Patient seen and examined this morning with son at bedside who provides clinical history. He reports she was feeling ill starting on Friday with fatigue, malaise and vomiting. 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 He denies her having fevers or chills. No reported hemoptysis.      Decided to transfer patient downtown due to worsening oxygen needs.  Upon EMS arrival, she dropped her O2 sats to ~60% and was intubated prior to transport.      Notable PMH:  has a past medical history of Arthritis, Chronic back pain, Colon polyp, Hypertension, Hyperthyroidism, Kidney stone, Liver disease, and Thyroid disease.    24 Hour events: remains on vent, 55%, PEEP 10, levophed  on 3. WBC down to 35. She is responsive and trialed on PSV and tolerates well, but not improved enough for true SBT.    Review of Systems: Unable to obtain due to patient factors.      Lines: (insertion date)   ETT  (Active)     NG/OG/NJ/NE Tube Center mouth (Active)        Urinary Catheter 11/25/23 Foley (Active)     CVC  11/27/23 Right Internal jugular (Active)     Drips: current dose (range)  Dose (mcg/kg/min) Propofol  : 15 mcg/kg/min  Dose (mcg/hr) Fentanyl : 25 mcg/hr  Dose (mcg/min) Norepinephrine : 3 mcg/min     Pertinent Exam:         Blood pressure (!) 120/50, pulse 63, temperature 98.7 F (37.1 C), temperature source Axillary, resp. rate 18, height 1.524 m (5'), weight 81.4 kg (179 lb 7.3 oz), SpO2 91%.   Intake/Output Summary (Last 24 hours) at 11/30/2023 0919  Last data filed at 11/30/2023 0400  Gross per 24 hour   Intake 1158.02 ml   Output 1025 ml   Net 133.02 ml     Constitutional:  currently sedated, intubated.  Critically ill.   EENMT:  Sclera clear, pupils equal, oral mucosa moist  Respiratory: BLS coarse rhonchi, ETT at 21cm at lip.   Cardiovascular:  RRR, no murmur  Gastrointestinal:  soft with no tenderness; positive bowel sounds present  Musculoskeletal:  warm with no cyanosis, no lower extremity edema  Skin:  no jaundice or ecchymosis  Neurologic:sedated, intubated.  Cough with dep suction.   Psychiatric: sedated    CXR: 10/5          Recent Labs     11/28/23  0325 11/29/23  0536 11/30/23  0433   WBC 59.5* 38.0*  35.1*   HGB 11.8 12.0 11.6*   HCT 36.6 36.1 35.6*   PLT 187 177 169     Recent Labs     11/28/23  0325 11/28/23  0834 11/29/23  0536 11/29/23  1800 11/30/23  0433   NA 139  --  142  --  146*   K 3.3* 4.0 3.8  --  4.2   CL 102  --  107  --  111*   CO2 28  --  28  --  28   GLUCOSE 155*  --  151*  --  177*   BUN 20  --  27*  --  31*   CREATININE 0.58*  --  0.59*  --  0.59*   MG 2.1  --  2.6*  --  2.5*   PHOS 1.4*  --  1.6* 2.6 2.3*     No results for input(s): TROPHS, NTPROBNP, CRP, ESR in the last 72 hours.  Recent Labs     11/28/23  0325 11/29/23  0536 11/30/23  0433   GLUCOSE 155* 151* 177*      ECHO: 11/25/23    ECHO (TTE) COMPLETE (PRN CONTRAST/BUBBLE/STRAIN/3D) 11/27/2023  8:12 AM (Final)    Interpretation Summary    Left Ventricle: Normal  left ventricular systolic function with a visually estimated EF of 65 - 70%. Left ventricle size is normal. Mild septal thickening. Normal wall motion. Normal diastolic function.    Right Ventricle: Right ventricle size is normal.  TAPSE is 2.0 cm. RV Basal Dimension is 2.7 cm. RV Mid Dimension is 2.4 cm. Normal systolic function.    Image quality is fair.    Signed by: Dorn Debby Finder, DO on 11/27/2023  8:12 AM    Microbiology:   Recent Labs     11/27/23  0927 11/27/23  1811   CULTURE MODERATE NORMAL RESPIRATORY FLORA SCANT NORMAL RESPIRATORY FLORA     Ventilator Settings Ideal body weight: 45.5 kg (100 lb 4.9 oz)  Adjusted ideal body weight: 59.9 kg (131 lb 15.5 oz)  Mode FIO2 Rate Tidal Volume Pressure   CPAP/PS (Weaned from RR.)    55 %  18 bpm         10     Peak airway pressure:     Minute ventilation:    ABG:  Recent Labs     11/28/23  0404 11/29/23  0428 11/30/23  0412   BE 7.9 8.6 7.3     Assessment and Plan:  (Medical Decision Making)   Impression: 68 y.o. female admitted 11/27/2023 for Acute respiratory failure with hypoxia (HCC). Admitted with SOB, cough found to have multifocal PNA, [place] and desaturated requiring intubation.    NEURO:   Sedation: propofol  for now, daily sedation break  Analgesia: fentanyl  gtt, daily sedation break    CV:   Shock: suspect septic, resuscitation, hemodynamic monitoring, vasopressors weaning    PULM:   Acute Hypoxemic and Hypercapneic Respiratory failure: LTVV, wean per ARDSnet. Considered diuretics, but will hold one more day, ideally, get off vasopressors and BUN improve and diurese.   Pneumonia: empiric antibiotics, cultures and bronch pending. PCN allergy, on cefepime .  D/C Vanc, MRSA negative, WBC improved slightly. Add atypical coverage  --change hydrocortisone  to solumedrol for ILD? CT today, PE protocol, add ILD labs  Chronic dyspnea: had been on symbicort  in past.  No wheezing currently, so would hold on inhaled steroids.  Albuterol , 3%  for now.    RENAL:  Electrolytes: replace  PRN    GI:   Nutrition: NGT placement. Will start TF tomorrow if still intubated and vasopressors low and stable.     HEME:   Anemia: resuscitation, monitor  Leukocytosis: improved levophed  dose and WBC, no fevers.     ID:   Multifocal PNA: on cefepime , vanc.  Wean as cultures return.  Blood cultures with no growth from 9/30. Bronch with broad cultures sent. Repeat sputum culture. NRF from bronch BAL    ENDO:   Hyperglycemia: mild on BMP.  Monitor with daily labs for now.  With steroids, may need to monitor more closely    Skin: no decub, turns, preventive care  Prophy: Lovenox     Son at bedside and updated.  Name Home Phone Work Phone Mobile Phone Relationship Tamsen Pickler   MYNIZD,FJUU [phone]  [phone] Child    DONAL SOBER [phone]  [phone] Daughter-in* No     Full Code    The patient is critically ill with respiratory failure, circulatory failure and requires high complexity decision making for assessment and support including frequent ventilator adjustment , frequent evaluation and titration of therapies , application of advanced monitoring technologies and extensive interpretation of multiple databases    Cumulative time devoted to patient care services by me for day of service -30 min     Bari Lessen, MD  "

## 2023-12-01 ENCOUNTER — Inpatient Hospital Stay: Admit: 2023-12-01 | Payer: Medicare (Managed Care)

## 2023-12-01 LAB — ARTERIAL BLOOD GAS, POC
Base Excess: 10.4 mmol/L
FIO2: 55 %
IPAP/PIP/High PEEP: 33
POC Allen's Test: POSITIVE
POC HCO3: 35.5 MMOL/L — ABNORMAL HIGH (ref 21–28)
POC O2 SAT: 96.6 % (ref 94–98)
POC PEEP: 10 cmH2O
POC PO2: 83 mmHg (ref 75–100)
POC TIDAL VOLUME: 400 mL
POC pCO2: 48.4 mmHg — ABNORMAL HIGH (ref 35–45)
POC pH: 7.47 — ABNORMAL HIGH (ref 7.35–7.45)
Respiratory Rate: 24

## 2023-12-01 LAB — BASIC METABOLIC PANEL
Anion Gap: 6 mmol/L — ABNORMAL LOW (ref 7–16)
BUN: 29 mg/dL — ABNORMAL HIGH (ref 8–23)
CO2: 31 mmol/L — ABNORMAL HIGH (ref 20–29)
Calcium: 8.9 mg/dL (ref 8.8–10.2)
Chloride: 114 mmol/L — ABNORMAL HIGH (ref 98–107)
Creatinine: 0.49 mg/dL — ABNORMAL LOW (ref 0.60–1.10)
Est, Glom Filt Rate: >90 ml/min/1.73m2 (ref >60)
Glucose: 200 mg/dL — ABNORMAL HIGH (ref 70–99)
Potassium: 4.8 mmol/L (ref 3.5–5.1)
Sodium: 151 mmol/L — ABNORMAL HIGH (ref 136–145)

## 2023-12-01 LAB — CULTURE, RESPIRATORY (WITH GRAM STAIN)
Culture: NORMAL
Gram Stain: NONE SEEN

## 2023-12-01 LAB — CBC WITH AUTO DIFFERENTIAL
Basophils %: 0.3 % (ref 0.0–2.0)
Basophils Absolute: 0.08 K/UL (ref 0.00–0.20)
Eosinophils %: 0 % — ABNORMAL LOW (ref 0.5–7.8)
Eosinophils Absolute: 0 K/UL (ref 0.00–0.80)
Hematocrit: 38 % (ref 35.8–46.3)
Hemoglobin: 12.2 g/dL (ref 11.7–15.4)
Immature Granulocytes %: 7.7 % — ABNORMAL HIGH (ref 0.0–5.0)
Immature Granulocytes Absolute: 2.06 K/UL — ABNORMAL HIGH (ref 0.0–0.5)
Lymphocytes %: 9 % — ABNORMAL LOW (ref 13.0–44.0)
Lymphocytes Absolute: 2.4 K/UL (ref 0.50–4.60)
MCH: 32.8 pg (ref 26.1–32.9)
MCHC: 32.1 g/dL (ref 31.4–35.0)
MCV: 102.2 FL — ABNORMAL HIGH (ref 82–102)
MPV: 10.4 FL (ref 9.4–12.3)
Monocytes %: 2.5 % — ABNORMAL LOW (ref 4.0–12.0)
Monocytes Absolute: 0.67 K/UL (ref 0.10–1.30)
Neutrophils %: 80.5 % — ABNORMAL HIGH (ref 43.0–78.0)
Neutrophils Absolute: 21.49 K/UL — ABNORMAL HIGH (ref 1.70–8.20)
Platelet Comment: ADEQUATE
Platelets: 183 K/uL (ref 150–450)
RBC: 3.72 M/uL — ABNORMAL LOW (ref 4.05–5.2)
RDW: 13.9 % (ref 11.9–14.6)
WBC: 26.7 K/uL — ABNORMAL HIGH (ref 4.3–11.1)
nRBC: 0.18 K/uL (ref 0.0–0.2)

## 2023-12-01 LAB — POCT GLUCOSE
POC Glucose: 183 mg/dL — ABNORMAL HIGH (ref 65–100)
POC Glucose: 188 mg/dL — ABNORMAL HIGH (ref 65–100)

## 2023-12-01 LAB — MAGNESIUM: Magnesium: 2.4 mg/dL (ref 1.8–2.4)

## 2023-12-01 LAB — RHEUMATOID FACTOR: Rheumatoid Factor: NEGATIVE

## 2023-12-01 LAB — PHOSPHORUS: Phosphorus: 2.7 mg/dL (ref 2.5–4.5)

## 2023-12-01 MED ORDER — DEXMEDETOMIDINE HCL IN NACL 400 MCG/100ML IV SOLN
400 | INTRAVENOUS | Status: DC
Start: 2023-12-01 — End: 2023-12-03
  Administered 2023-12-01: 15:00:00 0.4 ug/kg/h via INTRAVENOUS
  Administered 2023-12-01 – 2023-12-02 (×3): 0.6 ug/kg/h via INTRAVENOUS
  Administered 2023-12-03: 13:00:00 0.8 ug/kg/h via INTRAVENOUS
  Administered 2023-12-03: 04:00:00 0.6 ug/kg/h via INTRAVENOUS

## 2023-12-01 MED ORDER — POTASSIUM CHLORIDE 10 MEQ/100ML IV SOLN
10 | INTRAVENOUS | Status: DC | PRN
Start: 2023-12-01 — End: 2023-12-03

## 2023-12-01 MED ORDER — POTASSIUM BICARB-CITRIC ACID 20 MEQ PO TBEF
20 | ORAL | Status: DC | PRN
Start: 2023-12-01 — End: 2023-12-03

## 2023-12-01 MED ORDER — INSULIN LISPRO 100 UNIT/ML IJ SOLN
100 | Freq: Four times a day (QID) | INTRAMUSCULAR | Status: DC
Start: 2023-12-01 — End: 2023-12-03
  Administered 2023-12-01 – 2023-12-03 (×6): 2 [IU] via SUBCUTANEOUS

## 2023-12-01 MED FILL — PEG 3350 17 G PO PACK: 17 g | ORAL | Qty: 1

## 2023-12-01 MED FILL — SODIUM CHLORIDE 3 % IN NEBU: 3 % | RESPIRATORY_TRACT | Qty: 4

## 2023-12-01 MED FILL — FOLIC ACID 1 MG PO TABS: 1 mg | ORAL | Qty: 1

## 2023-12-01 MED FILL — DIPRIVAN 1000 MG/100ML IV EMUL: 1000 MG/100ML | INTRAVENOUS | Qty: 100

## 2023-12-01 MED FILL — METHYLPREDNISOLONE SODIUM SUCC 125 MG IJ SOLR: 125 mg | INTRAMUSCULAR | Qty: 125

## 2023-12-01 MED FILL — DEXMEDETOMIDINE HCL IN NACL 400 MCG/100ML IV SOLN: 400 MCG/100ML | INTRAVENOUS | Qty: 100

## 2023-12-01 MED FILL — AZITHROMYCIN 500 MG IV SOLR: 500 mg | INTRAVENOUS | Qty: 500

## 2023-12-01 MED FILL — CEFEPIME HCL 2 G IV SOLR: 2 g | INTRAVENOUS | Qty: 2

## 2023-12-01 MED FILL — FENTANYL CITRATE 1000 MCG/100ML IV SOLN: 1000 MCG/100ML | INTRAVENOUS | Qty: 100

## 2023-12-01 MED FILL — LOVENOX 40 MG/0.4ML IJ SOSY: 40 MG/0.4ML | INTRAMUSCULAR | Qty: 0.4

## 2023-12-01 MED FILL — IPRATROPIUM-ALBUTEROL 0.5-2.5 (3) MG/3ML IN SOLN: 0.5-2.5 (3) MG/3ML | RESPIRATORY_TRACT | Qty: 3

## 2023-12-01 MED FILL — INSULIN LISPRO 100 UNIT/ML IJ SOLN: 100 [IU]/mL | INTRAMUSCULAR | Qty: 2

## 2023-12-01 MED FILL — THIAMINE HCL 100 MG PO TABS: 100 mg | ORAL | Qty: 1

## 2023-12-01 MED FILL — PANTOPRAZOLE SODIUM 40 MG IV SOLR: 40 mg | INTRAVENOUS | Qty: 40

## 2023-12-01 MED FILL — NOREPINEPHRINE BITARTRATE 8 MG/250 ML (32 MCG/ML) IN 0.9 % NACL IV: 8-0.9 MG/250ML-% | INTRAVENOUS | Qty: 250

## 2023-12-01 MED FILL — LEVOTHYROXINE SODIUM 75 MCG PO TABS: 75 ug | ORAL | Qty: 1

## 2023-12-01 NOTE — Progress Notes (Signed)
 "  [facility] [place] Critical Care Note:: 12/01/2023  Sandra Harmon  Admission Date: 11/27/2023     Length of Stay: 4 days    Background: 68 y.o. Caucasian female with PMH significant for HLD, hypothyroidism, obesity who was seen and evaluated at the request of Dr. Leron after presentation on 9/30 for chest pain/shortness of breath. On arrival to ED, patient was noted to be hypoxic to mid 80s on RA and placed on HFNC 40L/60%.  Initial labs indicated lactic 3.4, Pro-Cal 1.9. WBC 13.5. Chest x-ray notable for diffuse bilateral infiltrates. ABG after being placed on CPAP 7.52/26/52/21. Admitted to ICU on HHFNC with increasing requirements over the past 24 hours. Pulmonary consulted for assistance AHRF and severe pneumonia.      Patient previously seen by Dr. Aurora in 2024 for chronic shortness of breath of unclear etiology after COVID infection. PFTs previously with mild restriction. Had been trialed on Symbicort /albuterol .   She was feeling ill starting on Friday with fatigue, malaise and vomiting. 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 He denies her having fevers or chills. No reported hemoptysis.      Decided to transfer patient downtown due to worsening oxygen needs.  Upon EMS arrival, she dropped her O2 sats to ~60% and was intubated prior to transport.      Notable PMH:  has a past medical history of Arthritis, Chronic back pain, Colon polyp, Hypertension, Hyperthyroidism, Kidney stone, Liver disease, and Thyroid disease.    24 Hour events: remains on vent, 55%, PEEP 10, levophed  on 3.  No major overnight events  Review of Systems: Unable to obtain due to patient factors.      Lines: (insertion date)   ETT  (Active)     NG/OG/NJ/NE Tube Center mouth (Active)       Urinary Catheter 11/25/23 Foley (Active)     CVC  11/27/23 Right Internal jugular (Active)     Drips: current dose (range)  Dose (mcg/kg/min) Propofol  : 15 mcg/kg/min  Dose (mcg/hr)  Fentanyl : 25 mcg/hr  Dose (mcg/min) Norepinephrine : 3 mcg/min     Pertinent Exam:         Blood pressure (!) 145/64, pulse 91, temperature 98.8 F (37.1 C), temperature source Axillary, resp. rate 22, height 1.524 m (5'), weight 81.4 kg (179 lb 7.3 oz), SpO2 97%.   Intake/Output Summary (Last 24 hours) at 12/01/2023 0730  Last data filed at 12/01/2023 0620  Gross per 24 hour   Intake 2383.87 ml   Output 1500 ml   Net 883.87 ml     Constitutional:  currently sedated, intubated.  Critically ill.   EENMT:  Sclera clear, pupils equal, oral mucosa moist  Respiratory: BLS coarse rhonchi, ETT at 21cm at lip.   Cardiovascular:  RRR, no murmur  Gastrointestinal:  soft with no tenderness; positive bowel sounds present  Musculoskeletal:  warm with no cyanosis, no lower extremity edema  Skin:  no jaundice or ecchymosis  Neurologic:sedated, intubated.  Cough with dep suction.   Psychiatric: sedated          CXR:         10/5          Recent Labs     11/29/23  0536 11/30/23  0433 11/30/23  1106 12/01/23  0624   WBC 38.0* 35.1*  --  26.7*   HGB 12.0 11.6*  --  12.2   HCT 36.1 35.6*  --  38.0   PLT 177 169  --  183   PROCAL  --   --  0.35*  --      Recent Labs     11/29/23  0536 11/29/23  1800 11/30/23  0433 12/01/23  0625   NA 142  --  146* 151*   K 3.8  --  4.2 4.8   CL 107  --  111* 114*   CO2 28  --  28 31*   GLUCOSE 151*  --  177* 200*   BUN 27*  --  31* 29*   CREATININE 0.59*  --  0.59* 0.49*   MG 2.6*  --  2.5* 2.4   PHOS 1.6* 2.6 2.3* 2.7     Recent Labs     11/30/23  1106   CRP 5.7*     Recent Labs     11/29/23  0536 11/30/23  0433 12/01/23  0625   GLUCOSE 151* 177* 200*      ECHO: 11/25/23    ECHO (TTE) COMPLETE (PRN CONTRAST/BUBBLE/STRAIN/3D) 11/27/2023  8:12 AM (Final)    Interpretation Summary    Left Ventricle: Normal left ventricular systolic function with a visually estimated EF of 65 - 70%. Left ventricle size is normal. Mild septal thickening. Normal wall motion. Normal diastolic function.    Right Ventricle: Right  ventricle size is normal.  TAPSE is 2.0 cm. RV Basal Dimension is 2.7 cm. RV Mid Dimension is 2.4 cm. Normal systolic function.    Image quality is fair.    Signed by: Dorn Debby Finder, DO on 11/27/2023  8:12 AM    Microbiology:   No results for input(s): CULTURE in the last 72 hours.    Ventilator Settings Ideal body weight: 45.5 kg (100 lb 4.9 oz)  Adjusted ideal body weight: 59.9 kg (131 lb 15.5 oz)  Mode FIO2 Rate Tidal Volume Pressure   AC/PRVC    96 %  20 bpm     400 mL   10     Peak airway pressure:     Minute ventilation:    ABG:  Recent Labs     11/29/23  0428 11/30/23  0412 12/01/23  0353   BE 8.6 7.3 10.4     Assessment and Plan:  (Medical Decision Making)   Impression: 68 y.o. female admitted 11/27/2023 for Acute respiratory failure with hypoxia (HCC). Admitted with SOB, cough found to have multifocal PNA, transferred to [facility] and desaturated requiring intubation.    NEURO:   Sedation: propofol  for now, daily sedation break  Analgesia: fentanyl  gtt, daily sedation break    CV:   Shock: suspect septic, resuscitation, hemodynamic monitoring, vasopressors weaning, still hypotensive, on Levophed  at 3, stop propofol  substituted with either Versed  or Precedex  in an attempt to wean off pressors.    PULM:   Acute Hypoxemic and Hypercapneic Respiratory failure: LTVV, wean per ARDSnet. Considered diuretics, but will hold one more day, ideally, get off vasopressors and BUN improve and diurese.   Pneumonia: empiric antibiotics, cultures and bronch pending. PCN allergy, on cefepime .  D/C Vanc, MRSA negative, WBC improved slightly. Add atypical coverage  --change hydrocortisone  to solumedrol for ILD?  Does not look typical for ILD flare, has multifocal bilateral infiltrates with air bronchograms suggestive of an infectious process.  Azithromycin  added to cefepime  and vancomycin  was stopped.  Bronchiolitis obliterans and organizing pneumonia is a possibility that would require a biopsy to confirm something the  patient is not a candidate for at this  time.  No Velcro crackles noted.  Chronic dyspnea: had been on symbicort  in past.  No wheezing currently, so would hold on inhaled steroids.  Albuterol , 3% for now.    RENAL:  Electrolytes: replace PRN    GI:   Nutrition: NGT placement. Will start TF tomorrow if still intubated and vasopressors low and stable.     HEME:   Anemia: resuscitation, monitor  Leukocytosis: improved levophed  dose and WBC, no fevers.     ID:   Multifocal PNA: on cefepime , azithromycin  for atypical coverage, MRSA negative thus vancomycin  was stopped.  Wean as cultures return.  Blood cultures with no growth from 9/30. Bronch with broad cultures sent. Repeat sputum culture. NRF from bronch BAL    ENDO:   Hyperglycemia: mild on BMP.  Monitor with daily labs for now.  With steroids, may need to monitor more closely    Skin: no decub, turns, preventive care  Prophy: Lovenox     Son at bedside and updated.  Name Home Phone Work Phone Mobile Phone Relationship Tamsen Pickler   MYNIZD,FJUU [phone]  [phone] Child    DONAL SOBER [phone]  [phone] Daughter-in* No     Full Code    The patient is critically ill with respiratory failure, circulatory failure and requires high complexity decision making for assessment and support including frequent ventilator adjustment , frequent evaluation and titration of therapies , application of advanced monitoring technologies and extensive interpretation of multiple databases    Total critical care time spent 30 minutes    Celine Southern, MD  "

## 2023-12-01 NOTE — ACP (Advance Care Planning) (Signed)
"  Advance Care Planning   Healthcare Decision Maker:    Primary Decision Maker: Sandra Harmon - Child [phone]    Secondary Decision Maker: Sandra Harmon - Daughter-in-Law - [phone]    Pt is a FULL Code.      Click here to complete Healthcare Decision Makers including selection of the Healthcare Decision Maker Relationship (ie Primary).             "

## 2023-12-01 NOTE — Plan of Care (Signed)
 "  Problem: Discharge Planning  Goal: Discharge to home or other facility with appropriate resources  12/01/2023 1029 by Dannielle Reef, RN  Outcome: Progressing  12/01/2023 0440 by Almarie Kins, RN  Outcome: Progressing     Problem: Safety - Adult  Goal: Free from fall injury  12/01/2023 1029 by Dannielle Reef, RN  Outcome: Progressing  12/01/2023 0440 by Almarie Kins, RN  Outcome: Progressing     Problem: Pain  Goal: Verbalizes/displays adequate comfort level or baseline comfort level  12/01/2023 1029 by Dannielle Reef, RN  Outcome: Progressing  12/01/2023 0440 by Almarie Kins, RN  Outcome: Progressing     Problem: Safety - Medical Restraint  Goal: Remains free of injury from restraints (Restraint for Interference with Medical Device)  Description: INTERVENTIONS:  1. Determine that other, less restrictive measures have been tried or would not be effective before applying the restraint  2. Evaluate the patient's condition at the time of restraint application  3. Inform patient/family regarding the reason for restraint  4. Q2H: Monitor safety, psychosocial status, comfort, nutrition and hydration  12/01/2023 1029 by Dannielle Reef, RN  Outcome: Progressing  Flowsheets (Taken 12/01/2023 0740 by Sheril Cohens, RN)  Remains free of injury from restraints (restraint for interference with medical device):   Determine that other, less restrictive measures have been tried or would not be effective before applying the restraint   Every 2 hours: Monitor safety, psychosocial status, comfort, nutrition and hydration   Inform patient/family regarding the reason for restraint   Evaluate the patient's condition at the time of restraint application  12/01/2023 0440 by Almarie Kins, RN  Outcome: Progressing  Flowsheets (Taken 11/30/2023 1938)  Remains free of injury from restraints (restraint for interference with medical device):   Determine that other, less restrictive measures have been tried or would not be effective before applying the restraint    Evaluate the patient's condition at the time of restraint application   Inform patient/family regarding the reason for restraint   Every 2 hours: Monitor safety, psychosocial status, comfort, nutrition and hydration     Problem: Skin/Tissue Integrity  Goal: Skin integrity remains intact  Description: 1.  Monitor for areas of redness and/or skin breakdown  2.  Assess vascular access sites hourly  3.  Every 4-6 hours minimum:  Change oxygen saturation probe site  4.  Every 4-6 hours:  If on nasal continuous positive airway pressure, respiratory therapy assess nares and determine need for appliance change or resting period  12/01/2023 1029 by Dannielle Reef, RN  Outcome: Progressing  12/01/2023 0440 by Almarie Kins, RN  Outcome: Progressing     Problem: Neurosensory - Adult  Goal: Achieves maximal functionality and self care  12/01/2023 1029 by Dannielle Reef, RN  Outcome: Progressing  12/01/2023 0440 by Almarie Kins, RN  Outcome: Progressing     Problem: Respiratory - Adult  Goal: Achieves optimal ventilation and oxygenation  12/01/2023 1029 by Dannielle Reef, RN  Outcome: Progressing  12/01/2023 0440 by Almarie Kins, RN  Outcome: Progressing     Problem: Cardiovascular - Adult  Goal: Maintains optimal cardiac output and hemodynamic stability  12/01/2023 1029 by Dannielle Reef, RN  Outcome: Progressing  12/01/2023 0440 by Almarie Kins, RN  Outcome: Progressing     Problem: Skin/Tissue Integrity - Adult  Goal: Skin integrity remains intact  Description: 1.  Monitor for areas of redness and/or skin breakdown  2.  Assess vascular access sites hourly  3.  Every 4-6 hours minimum:  Change oxygen saturation  probe site  4.  Every 4-6 hours:  If on nasal continuous positive airway pressure, respiratory therapy assess nares and determine need for appliance change or resting period  12/01/2023 1029 by Dannielle Reef, RN  Outcome: Progressing  12/01/2023 0440 by Almarie Kins, RN  Outcome: Progressing     Problem: Musculoskeletal - Adult  Goal: Return  ADL status to a safe level of function  12/01/2023 1029 by Dannielle Reef, RN  Outcome: Progressing  12/01/2023 0440 by Almarie Kins, RN  Outcome: Progressing     Problem: Gastrointestinal - Adult  Goal: Maintains adequate nutritional intake  12/01/2023 1029 by Dannielle Reef, RN  Outcome: Progressing  12/01/2023 0440 by Almarie Kins, RN  Outcome: Progressing     Problem: Genitourinary - Adult  Goal: Urinary catheter remains patent  12/01/2023 1029 by Dannielle Reef, RN  Outcome: Progressing  12/01/2023 0440 by Almarie Kins, RN  Outcome: Progressing     Problem: Infection - Adult  Goal: Absence of infection at discharge  12/01/2023 1029 by Dannielle Reef, RN  Outcome: Progressing  12/01/2023 0440 by Almarie Kins, RN  Outcome: Progressing     Problem: Metabolic/Fluid and Electrolytes - Adult  Goal: Electrolytes maintained within normal limits  12/01/2023 1029 by Dannielle Reef, RN  Outcome: Progressing  12/01/2023 0440 by Almarie Kins, RN  Outcome: Progressing     Problem: Coping  Goal: Pt/Family able to verbalize concerns and demonstrate effective coping strategies  Description: INTERVENTIONS:  1. Assist patient/family to identify coping skills, available support systems and cultural and spiritual values  2. Provide emotional support, including active listening and acknowledgement of concerns of patient and caregivers  3. Reduce environmental stimuli, as able  4. Instruct patient/family in relaxation techniques, as appropriate  5. Assess for spiritual pain/suffering and initiate Spiritual Care, Psychosocial Clinical Specialist consults as needed  12/01/2023 1029 by Dannielle Reef, RN  Outcome: Progressing  12/01/2023 0440 by Almarie Kins, RN  Outcome: Progressing     "

## 2023-12-01 NOTE — Plan of Care (Signed)
 "  Problem: Discharge Planning  Goal: Discharge to home or other facility with appropriate resources  Outcome: Progressing     Problem: Safety - Adult  Goal: Free from fall injury  Outcome: Progressing     Problem: Pain  Goal: Verbalizes/displays adequate comfort level or baseline comfort level  Outcome: Progressing     Problem: Safety - Medical Restraint  Goal: Remains free of injury from restraints (Restraint for Interference with Medical Device)  Description: INTERVENTIONS:  1. Determine that other, less restrictive measures have been tried or would not be effective before applying the restraint  2. Evaluate the patient's condition at the time of restraint application  3. Inform patient/family regarding the reason for restraint  4. Q2H: Monitor safety, psychosocial status, comfort, nutrition and hydration  Outcome: Progressing  Flowsheets (Taken 11/30/2023 1938)  Remains free of injury from restraints (restraint for interference with medical device):   Determine that other, less restrictive measures have been tried or would not be effective before applying the restraint   Evaluate the patient's condition at the time of restraint application   Inform patient/family regarding the reason for restraint   Every 2 hours: Monitor safety, psychosocial status, comfort, nutrition and hydration     Problem: Skin/Tissue Integrity  Goal: Skin integrity remains intact  Description: 1.  Monitor for areas of redness and/or skin breakdown  2.  Assess vascular access sites hourly  3.  Every 4-6 hours minimum:  Change oxygen saturation probe site  4.  Every 4-6 hours:  If on nasal continuous positive airway pressure, respiratory therapy assess nares and determine need for appliance change or resting period  Outcome: Progressing     Problem: Neurosensory - Adult  Goal: Achieves maximal functionality and self care  Outcome: Progressing     Problem: Respiratory - Adult  Goal: Achieves optimal ventilation and oxygenation  Outcome:  Progressing     Problem: Cardiovascular - Adult  Goal: Maintains optimal cardiac output and hemodynamic stability  Outcome: Progressing     Problem: Skin/Tissue Integrity - Adult  Goal: Skin integrity remains intact  Description: 1.  Monitor for areas of redness and/or skin breakdown  2.  Assess vascular access sites hourly  3.  Every 4-6 hours minimum:  Change oxygen saturation probe site  4.  Every 4-6 hours:  If on nasal continuous positive airway pressure, respiratory therapy assess nares and determine need for appliance change or resting period  Outcome: Progressing     Problem: Musculoskeletal - Adult  Goal: Return ADL status to a safe level of function  Outcome: Progressing     Problem: Gastrointestinal - Adult  Goal: Maintains adequate nutritional intake  Outcome: Progressing     Problem: Genitourinary - Adult  Goal: Urinary catheter remains patent  Outcome: Progressing     Problem: Infection - Adult  Goal: Absence of infection at discharge  Outcome: Progressing     Problem: Metabolic/Fluid and Electrolytes - Adult  Goal: Electrolytes maintained within normal limits  Outcome: Progressing     Problem: Coping  Goal: Pt/Family able to verbalize concerns and demonstrate effective coping strategies  Description: INTERVENTIONS:  1. Assist patient/family to identify coping skills, available support systems and cultural and spiritual values  2. Provide emotional support, including active listening and acknowledgement of concerns of patient and caregivers  3. Reduce environmental stimuli, as able  4. Instruct patient/family in relaxation techniques, as appropriate  5. Assess for spiritual pain/suffering and initiate Spiritual Care, Psychosocial Clinical Specialist consults as needed  Outcome: Progressing     "

## 2023-12-01 NOTE — Care Coordination (Signed)
"  Pt chart reviewed for discharge planning.  LMSW met with pt and son Sandra at bedside, verified demographic information/health insurance with son.  Pt is currently intubated in ICU.  Pt lives with son and his family but was planning to move out into her own apartment soon.  Pt normally manages ADL's.  PCP was confirmed, last seen 10/02/2023.  CM staff will follow pt plan of care and assist with supportive care referrals pending pt clinical progress.  Please consult case management if specific needs arise.    12/01/23 1507   Service Assessment   Information Provided By Child/Family;Medical Record   Patient Orientation Other (see comment)  (currently intubated)   Cognition Other (see comment)  (hx of No defficits, currently intubated)   Support Systems Children   Patient's Healthcare Decision Maker is: Armed [employer] Oper[employer] Officer Next of Kin  (son Sandra Harmon)   PCP Verified by CM Yes  (10/02/2023)   Last Visit to PCP Within last 3 months   Prior Functional Level Independent in ADLs/IADLs   History of falls? 0   Current Functional Level at Time of Initial Assessment Unable to Assess   Can patient return to prior living arrangement Yes  (unless other care recommenations received)   Ability to make needs known: Good   Family able to assist with home care needs: Yes   Other than yourself, who should be included in your transition plan for discharge from the hospital? son Sandra Harmon [facility]   Current Community Resources None   Social/Functional History   Prior Level of Assist for ADLs Independent   Ambulation Assistance Independent   Occupation Retired   Surveyor, Minerals Prior to Acute Admission [employer]   Lives With Son   Current Services Prior To Admission None   Potential Assistance Needed Other (Comment)  (await any recommendations)   Potential Assistance Obtaining Medications No   Patient expects to be discharged to: Other (comment)  (await any rehab/care recommendations)   Services At/After Discharge   [employer]  Information Provided? No   Who will provide transportation at discharge? Family   Condition of Participation: Discharge Planning   The Plan for Transition of Care is related to the following treatment goals: Pt will discharge to the least restrictive setting necessary to meet care needs.   The Patient and/or Patient Representative was provided with a Choice of Provider? Patient Representative   Name of the Patient Representative who was provided with the Choice of Provider and agrees with the Discharge Plan?  son Sandra       "

## 2023-12-01 NOTE — Interdisciplinary Rounds (Addendum)
"  Multi-D Rounds/Checklist (leapfrog):  Lines: can any be removed?: None   ETT  (Active)     NG/OG/NJ/NE Tube Center mouth (Active)       Urinary Catheter 11/25/23 Foley (Active)     CVC  11/27/23 Right Internal jugular (Active)     DVT Prophylaxis: Ordered-SCDs  Vent: HOB elevated? Yes ;  mL/kg: N/A ; Vent day 5  Nutrition Ordered/appropriate: Ordered  Can antibiotics or other drugs be stopped? Yes/End Date set Yes/No  MRSA swab:   Inpat Anti-Infectives (From admission, onward)       Start     Ordered Stop    11/30/23 1000  azithromycin  (ZITHROMAX ) 500 mg in sodium chloride  0.9 % 250 mL IVPB (Vial2Bag)  500 mg,   IntraVENous,   EVERY 24 HOURS         11/30/23 0941 12/03/23 0959    11/28/23 0030  ceFEPIme  (MAXIPIME ) 2,000 mg in sodium chloride  0.9 % 100 mL IVPB (addEASE)  2,000 mg,   IntraVENous,   EVERY 8 HOURS         11/27/23 1555 12/05/23 0029                  Consults needed: None  A: Is pain control adequate? (has PRNs? Stop drip?) Yes  B: Sedation break and SBT? Yes  C: Is sedation choice appropriate? Yes  D: Delirium/CAM-ICU? No  E: Mobility goals/appropriateness? Orders adjusted  F: Family update and plan? Son is runner, broadcasting/film/video and is being updated daily by primary attending and nursing staff.    Delon LITTIE Magnus, PA  "

## 2023-12-01 NOTE — Progress Notes (Signed)
"  Patient turned from lying R side to supine. Patient HR brady and sustained in 30's. Patient recovered without intervention. HR 70.  "

## 2023-12-01 NOTE — Consults (Signed)
 "Nutrition Assessment  Assessment Type: Reassess  Reason for visit:  Tube Feeding Management (Pulmonology)    Nutrition Intervention:   Food and/or Nutrient Delivery:   Enteral Nutrition:   Enteral Access: Orogastric  Continue  Formula: Peptide Based High Protein (Vital High Protein)  Goal Rate: Continuous 50 ml/hr  Increase  Water  flush  125 ml every 4 hours  Modulars: None not indicated at this time  Enteral regimen at above goal to provide per 24 hours:  1100 calories (98% calorie needs), 96 grams protein and 1669 ml free fluid.    Above regimen: Intended to meet macronutrient goals Exceeds fluid goal secondary to hypernatremia  Biochemical Data:  Basic Metabolic Panel, Magnesium  and Phosphorus active per nutrition parameters  POC Glucoses/SSI Activate: AM glucose >200 mg/dL   Nutrition Related Medication Management:  Electrolyte Replacement:   Continue prn protocol Magnesium  and Phosphorus   Adjust K for enteral route   Intravenous fluids: Not applicable  Vitamin and Mineral Supplements:  Thiamine  100 mg daily x 7 days (EOT 10/9)  Folic Acid  1 mg daily x 7 days (EOT 10/9)  Bowel Regimen: Active prn on [facility]  Coordination of Nutrition Care:  Coordination with health care provider Provider, Delon PA and RN, [facility]     Malnutrition Assessment:  Academy/A.S.P.E.N Clinical Malnutrition Criteria  Malnutrition Status: At risk for malnutrition (intubated, poor intake PTA, reports of weight loss pta)    Nutrition Focused Physical Exam: visual NFPE unremarkable    Nutrition Assessment:  Food/Nutrition Related History: 10/3: Son reports pt was not eating well for a few days to a week pta.   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 She reports patient is not eating much lately and that her mother (the patient) reports 4lbs weight loss since admission to hospital.          Weight History: He endorses wt loss. Per EMR wt hx review of family medicine  office visits 11/5 180lb, 2/4 180lb, 4/1 185lb, 5/7 181lb, 8/7 185lb.   Nutrition Background:       PMH significant for HLD, hypothyroidism, osteoporosis, left kidney mass, and obesity, liver disease. Pt admitted to [facility] 9/30 with severe sepsis, acute respiratory failure, bilateral community acquired pneumonia, hypokalemia, and lactic acidosis. She was intubated ust prior to transfer to [facility] on 10/2.   10/2: BAL  Nutrition Monitoring/Evaluation:  OGT placed 10/2. TF start 10/3 (Vital HP) + propofol . At goal (50 mL/hr) 10/4 PM.     Reviewed patient with RN, Bridgette. Pt transitioned from propofol  to precedex  this AM. Mild edema to bilateral upper and lower extremities.     Abdominal Status (last documented by nursing):    , GI Symptoms: Other (Comment) (NGT with tube feed)   Pertinent Medications: azithromycin , maxipime , folvite , synthroid , solu-cortef , protonix , thiamine    Continuous: precedex , fentanyl , levo  IVF: none  Electrolyte Replacement:  10/3: Kcl, 10/4: 15 mmol NaPhos  Pertinent administered PRN: none  Pertinent Labs:   Lab Results   Component Value Date/Time    NA 151 12/01/2023 06:25 AM    K 4.8 12/01/2023 06:25 AM    CL 114 12/01/2023 06:25 AM    CO2 31 12/01/2023 06:25 AM    BUN 29 12/01/2023 06:25 AM    CREATININE 0.49 12/01/2023 06:25 AM    GLUCOSE 200 12/01/2023 06:25 AM    GLUCOSE 157 07/30/2022 10:03 AM    CALCIUM 8.9 12/01/2023 06:25 AM  PHOS 2.7 12/01/2023 06:25 AM    MG 2.4 12/01/2023 06:25 AM      This SmartLink has not been configured with any valid records.   No recent A1C.    Remarkable for: Pt now with significant hypernatremia, BG elevated (likely r/t steroids). Mg normalized from mildly elevated. Phos normalized from low.     Current Ordered Nutrition Therapies:  Diet NPO  ADULT TUBE FEEDING; Orogastric; Peptide Based High Protein; Continuous; 10; Yes; 10; Q 8 hours; 50; 30; Q 4 hours  Active Enteral Nutrition Order:  Route: Orogastric  Composition: Peptide Based High  Protein  Schedule: Continuous  Rate/volume: 50 mL/hr  Additives/Modulars: None  Water  Flushes: 30 mL q 4  Provides per 24 hours: 1100 calories, 96 grams protein and free fluid.    Current Intake:   Average Meal Intake: NPO        Anthropometric Measures:  Height: 152.4 cm (5')  Current Body Wt: 87 kg (191 lb 12.8 oz) (10/6), Weight source: Bed scale  BMI: 37.5, Obese Class 2 (BMI 35.0 -39.9)  Admission Body Weight: 81.4 kg (179 lb 7.3 oz) (10/2 bedscale)  Ideal Body Weight (Kg) (Calculated): 45 kg (100 lbs), 189.6 %  BMI Category Obese Class 2 (BMI 35.0 -39.9)    Comparative Standards:  Energy (kcal/day): 1125-1350 (25-30 kcal/kg) (Kcal/kg Weight used: 45 kg Ideal  Protein (g/day): 90-113 (2-2.5g/kg) Weight Used: (Ideal) 45 kg  Fluid (ml/day):   (1 ml/kcal)    Nutrition Diagnosis:   Inadequate oral intake related to impaired respiratory function as evidenced by NPO or clear liquid status due to medical condition, intubation    Nutrition Goal(s):   Previous Goal Met: Goal(s) Achieved  Active Goal: Tolerate nutrition support at goal rate, by next RD assessment  Type of Goal: Continue current goal    Discharge Planning:    Too soon to determine    Lauraine Makos, RD    "

## 2023-12-02 LAB — ARTERIAL BLOOD GAS, POC
Base Excess: 7.8 mmol/L
Base Excess: 8.7 mmol/L
FIO2: 80 %
FIO2: 90 %
IPAP/PIP/High PEEP: 26
Inspiratory Time: 0.85 s
MEAN AIRWAY PRESSURE: 15 cmH2O
POC Allen's Test: POSITIVE
POC Allen's Test: POSITIVE
POC HCO3: 33.4 MMOL/L — ABNORMAL HIGH (ref 21–28)
POC HCO3: 32.6 MMOL/L — ABNORMAL HIGH (ref 21–28)
POC O2 SAT: 86.2 % — ABNORMAL LOW (ref 94–98)
POC O2 SAT: 95.7 % (ref 94–98)
POC PEEP: 10 cmH2O
POC PEEP: 8 cmH2O
POC PO2: 51 mmHg — ABNORMAL LOW (ref 75–100)
POC PO2: 73 mmHg — ABNORMAL LOW (ref 75–100)
POC TIDAL VOLUME: 550 mL
POC pCO2: 49.4 mmHg — ABNORMAL HIGH (ref 35–45)
POC pCO2: 41.1 mmHg (ref 35–45)
POC pH: 7.44 (ref 7.35–7.45)
POC pH: 7.51 — ABNORMAL HIGH (ref 7.35–7.45)

## 2023-12-02 LAB — ANA COMPREHENSIVE PANEL
Anti-RNP: 0.8 AI (ref 0.0–0.9)
CENTROMERE PROTEIN B ANTIBODY: <0.2 AI (ref 0.0–0.9)
Chromatin Antibody: <0.2 AI (ref 0.0–0.9)
ENA JO-1 Ab: <0.2 AI (ref 0.0–0.9)
ENA SCL-70 Ab: <0.2 AI (ref 0.0–0.9)
ENA SSA (RO) Ab: <0.2 AI (ref 0.0–0.9)
ENA SSB (LA) Ab: <0.2 AI (ref 0.0–0.9)
ENA Smith (SM) Ab: <0.2 AI (ref 0.0–0.9)
dsDNA Ab: <1 [IU]/mL (ref 0–9)

## 2023-12-02 LAB — CBC WITH AUTO DIFFERENTIAL
Basophils %: 0.1 % (ref 0.0–2.0)
Basophils Absolute: 0.02 K/UL (ref 0.00–0.20)
Eosinophils %: 0 % — ABNORMAL LOW (ref 0.5–7.8)
Eosinophils Absolute: 0 K/UL (ref 0.00–0.80)
Hematocrit: 36.8 % (ref 35.8–46.3)
Hemoglobin: 11.4 g/dL — ABNORMAL LOW (ref 11.7–15.4)
Immature Granulocytes %: 1.6 % (ref 0.0–5.0)
Immature Granulocytes Absolute: 0.3 K/UL (ref 0.0–0.5)
Lymphocytes %: 6.6 % — ABNORMAL LOW (ref 13.0–44.0)
Lymphocytes Absolute: 1.23 K/UL (ref 0.50–4.60)
MCH: 31.9 pg (ref 26.1–32.9)
MCHC: 31 g/dL — ABNORMAL LOW (ref 31.4–35.0)
MCV: 103.1 FL — ABNORMAL HIGH (ref 82–102)
MPV: 10.5 FL (ref 9.4–12.3)
Monocytes %: 1.7 % — ABNORMAL LOW (ref 4.0–12.0)
Monocytes Absolute: 0.31 K/UL (ref 0.10–1.30)
Neutrophils %: 90 % — ABNORMAL HIGH (ref 43.0–78.0)
Neutrophils Absolute: 16.91 K/UL — ABNORMAL HIGH (ref 1.70–8.20)
Platelets: 143 K/uL — ABNORMAL LOW (ref 150–450)
RBC: 3.57 M/uL — ABNORMAL LOW (ref 4.05–5.2)
RDW: 14.1 % (ref 11.9–14.6)
WBC: 18.8 K/uL — ABNORMAL HIGH (ref 4.3–11.1)
nRBC: 0.04 K/uL (ref 0.0–0.2)

## 2023-12-02 LAB — PHOSPHORUS: Phosphorus: 3.6 mg/dL (ref 2.5–4.5)

## 2023-12-02 LAB — BASIC METABOLIC PANEL
Anion Gap: 6 mmol/L — ABNORMAL LOW (ref 7–16)
BUN: 34 mg/dL — ABNORMAL HIGH (ref 8–23)
CO2: 30 mmol/L — ABNORMAL HIGH (ref 20–29)
Calcium: 8.7 mg/dL — ABNORMAL LOW (ref 8.8–10.2)
Chloride: 115 mmol/L — ABNORMAL HIGH (ref 98–107)
Creatinine: 0.47 mg/dL — ABNORMAL LOW (ref 0.60–1.10)
Est, Glom Filt Rate: >90 ml/min/1.73m2 (ref >60)
Glucose: 190 mg/dL — ABNORMAL HIGH (ref 70–99)
Potassium: 4.6 mmol/L (ref 3.5–5.1)
Sodium: 151 mmol/L — ABNORMAL HIGH (ref 136–145)

## 2023-12-02 LAB — POCT GLUCOSE
POC Glucose: 171 mg/dL — ABNORMAL HIGH (ref 65–100)
POC Glucose: 187 mg/dL — ABNORMAL HIGH (ref 65–100)
POC Glucose: 180 mg/dL — ABNORMAL HIGH (ref 65–100)
POC Glucose: 236 mg/dL — ABNORMAL HIGH (ref 65–100)
POC Glucose: 149 mg/dL — ABNORMAL HIGH (ref 65–100)

## 2023-12-02 LAB — MAGNESIUM: Magnesium: 2.1 mg/dL (ref 1.8–2.4)

## 2023-12-02 LAB — ANCA PANEL
Atypical pANCA: <1:20 {titer}
Cytoplasmic (C-ANCA): <1:20 {titer}
Myeloperoxidase Ab: <0.2 U (ref 0.0–0.9)
Perinuclear (P-ANCA): <1:20 {titer}
Proteinase 3 AB: <0.2 U (ref 0.0–0.9)

## 2023-12-02 MED FILL — INSULIN LISPRO 100 UNIT/ML IJ SOLN: 100 [IU]/mL | INTRAMUSCULAR | Qty: 2

## 2023-12-02 MED FILL — SODIUM CHLORIDE 3 % IN NEBU: 3 % | RESPIRATORY_TRACT | Qty: 4

## 2023-12-02 MED FILL — METHYLPREDNISOLONE SODIUM SUCC 125 MG IJ SOLR: 125 mg | INTRAMUSCULAR | Qty: 125

## 2023-12-02 MED FILL — DEXMEDETOMIDINE HCL IN NACL 400 MCG/100ML IV SOLN: 400 MCG/100ML | INTRAVENOUS | Qty: 100

## 2023-12-02 MED FILL — FOLIC ACID 1 MG PO TABS: 1 mg | ORAL | Qty: 1

## 2023-12-02 MED FILL — PANTOPRAZOLE SODIUM 40 MG IV SOLR: 40 mg | INTRAVENOUS | Qty: 40

## 2023-12-02 MED FILL — LOVENOX 40 MG/0.4ML IJ SOSY: 40 MG/0.4ML | INTRAMUSCULAR | Qty: 0.4

## 2023-12-02 MED FILL — CEFEPIME HCL 2 G IV SOLR: 2 g | INTRAVENOUS | Qty: 2

## 2023-12-02 MED FILL — FENTANYL CITRATE 1000 MCG/100ML IV SOLN: 1000 MCG/100ML | INTRAVENOUS | Qty: 100

## 2023-12-02 MED FILL — IPRATROPIUM-ALBUTEROL 0.5-2.5 (3) MG/3ML IN SOLN: 0.5-2.5 (3) MG/3ML | RESPIRATORY_TRACT | Qty: 3

## 2023-12-02 MED FILL — THIAMINE HCL 100 MG PO TABS: 100 mg | ORAL | Qty: 1

## 2023-12-02 MED FILL — AZITHROMYCIN 500 MG IV SOLR: 500 mg | INTRAVENOUS | Qty: 500

## 2023-12-02 MED FILL — LEVOTHYROXINE SODIUM 75 MCG PO TABS: 75 ug | ORAL | Qty: 1

## 2023-12-02 NOTE — Progress Notes (Signed)
"  Spiritual Health Critical Care Progress Note  Sandra Harmon      Room # 6885/98    Name: Sandra Harmon           Age: 68 y.o.    Gender: female          MRN: [id number]  Religion: Christian       Preferred Language: English      Date: 12/02/23  Visit Time: Begin Time: 0950 End Time : 1000  Complexity of Encounter: Moderate      Visit Summary: Chaplain visited patient, son and daughter-in-law in ICU after attending IDR to assess spiritual health needs. Patient is intubated and did not respond at time.  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 Family engaged in life review and spiritual reflection, stating that they may have to have goals of care conversations in near future. Family express appropriate grief, but appear to cope well.  Chaplain provided pastoral presence, prayer and empathetic listening. Chaplain will continue to follow patient in ICU and assess further support.      Referral/Consult From: Patient  Encounter Overview/Reason: Initial Encounter  Service Provided For: Patient and family together     Patient was available.    Faith, Belief, Meaning:   Patient identifies as spiritual  is connected with a faith tradition or spiritual practice  has beliefs or practices that help with coping during difficult times  faith/ spirituality is a source of strength  Family/Friends identifies as spiritual  are connected with a faith tradition or spiritual practice  have beliefs or practices that help with coping during difficult times  faith/ spirituality is a source of strength    Importance and Influence:  Patient does not have spiritual/personal beliefs that influence decisions regarding their health  Family/Friends does not have spiritual/personal beliefs that influence decisions regarding the patient's health    Community:  Patient   is connected with a spiritual community  indicated that they feel well-supported  Support System  Includes   Children   Family/Friends   is connected with a spiritual community  indicated that they feel well-supported  Support System Includes   Children     Assessment and Plan of Care:   Emotions Expressed by Patient:   Assessment: Unable to assess    Interventions by Chaplain:   Intervention: Prayer (assurance of)/Blessing, Sustaining Presence/Ministry of presence     Result/ Response by Patient:   Outcome: Did not respond    Patient Plan of Care:   Plan and Referrals  Plan/Referrals: Continue to visit, (comment)     Emotions Expressed by Spouse/Family/Friends:   calm  weary    Chaplain Interventions with Spouse/ Family/Friends include:   active listening  prayer  provided ministry of presence  facilitated life review and/or legacy    Spouse/Family/Friends Plan of Care:   Spiritual care available upon referral.      Electronically signed by    Peace be with you,    Chaplain Prentice Molt, M.Div., B.C.C.   on 12/02/2023 at 10:33 AM.   Spiritual Health   [facility]  [phone]         "

## 2023-12-02 NOTE — Plan of Care (Signed)
"    Problem: Discharge Planning  Goal: Discharge to home or other facility with appropriate resources  Outcome: Progressing  Flowsheets (Taken 12/01/2023 2030 by Maryann Bouchard, RN)  Discharge to home or other facility with appropriate resources:   Identify barriers to discharge with patient and caregiver   Arrange for needed discharge resources and transportation as appropriate     Problem: Safety - Adult  Goal: Free from fall injury  Outcome: Progressing     Problem: Pain  Goal: Verbalizes/displays adequate comfort level or baseline comfort level  Outcome: Progressing  Flowsheets (Taken 12/01/2023 2030 by Maryann Bouchard, RN)  Verbalizes/displays adequate comfort level or baseline comfort level: Assess pain using appropriate pain scale     Problem: Safety - Medical Restraint  Goal: Remains free of injury from restraints (Restraint for Interference with Medical Device)  Description: INTERVENTIONS:  1. Determine that other, less restrictive measures have been tried or would not be effective before applying the restraint  2. Evaluate the patient's condition at the time of restraint application  3. Inform patient/family regarding the reason for restraint  4. Q2H: Monitor safety, psychosocial status, comfort, nutrition and hydration  Outcome: Progressing     Problem: Skin/Tissue Integrity  Goal: Skin integrity remains intact  Description: 1.  Monitor for areas of redness and/or skin breakdown  2.  Assess vascular access sites hourly  3.  Every 4-6 hours minimum:  Change oxygen saturation probe site  4.  Every 4-6 hours:  If on nasal continuous positive airway pressure, respiratory therapy assess nares and determine need for appliance change or resting period  Outcome: Progressing  Flowsheets (Taken 12/01/2023 2030 by Maryann Bouchard, RN)  Skin Integrity Remains Intact: Monitor for areas of redness and/or skin breakdown     Problem: Respiratory - Adult  Goal: Achieves optimal ventilation and oxygenation  Outcome:  Progressing  Flowsheets (Taken 12/01/2023 2030 by Maryann Bouchard, RN)  Achieves optimal ventilation and oxygenation:   Assess for changes in respiratory status   Assess for changes in mentation and behavior   Position to facilitate oxygenation and minimize respiratory effort     Problem: Skin/Tissue Integrity - Adult  Goal: Skin integrity remains intact  Description: 1.  Monitor for areas of redness and/or skin breakdown  2.  Assess vascular access sites hourly  3.  Every 4-6 hours minimum:  Change oxygen saturation probe site  4.  Every 4-6 hours:  If on nasal continuous positive airway pressure, respiratory therapy assess nares and determine need for appliance change or resting period  Outcome: Progressing  Flowsheets (Taken 12/01/2023 2030 by Maryann Bouchard, RN)  Skin Integrity Remains Intact: Monitor for areas of redness and/or skin breakdown     "

## 2023-12-02 NOTE — Progress Notes (Signed)
 "  [facility] Critical Care Note:: 12/02/2023  Sandra Harmon  Admission Date: 11/27/2023     Length of Stay: 5 days    Background: 68 y.o. Caucasian female with PMH significant for HLD, hypothyroidism, obesity who was seen and evaluated at the request of Dr. Leron after presentation on 9/30 for chest pain/shortness of breath. On arrival to ED, patient was noted to be hypoxic to mid 80s on RA and placed on HFNC 40L/60%.  Initial labs indicated lactic 3.4, Pro-Cal 1.9. WBC 13.5. Chest x-ray notable for diffuse bilateral infiltrates. ABG after being placed on CPAP 7.52/26/52/21. Admitted to ICU on HHFNC with increasing requirements over the past 24 hours. Pulmonary consulted for assistance AHRF and severe pneumonia.      Patient previously seen by Dr. Aurora in 2024 for chronic shortness of breath of unclear etiology after COVID infection. PFTs previously with mild restriction. Had been trialed on Symbicort /albuterol .   She was feeling ill starting on Friday with fatigue, malaise and vomiting. 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 He denies her having fevers or chills. No reported hemoptysis.      Decided to transfer patient downtown due to worsening oxygen needs.  Upon EMS arrival, she dropped her O2 sats to ~60% and was intubated prior to transport.      Notable PMH:  has a past medical history of Arthritis, Chronic back pain, Colon polyp, Hypertension, Hyperthyroidism, Kidney stone, Liver disease, and Thyroid disease.    24 Hour events:   Worse over night, now on 100% FiO2 on pressure control ventilation with 520 tidal volumes on PEEP of 10 with a plateau pressure of 30, ventilator adjusted inspiratory pressure decreased to 8, PEEP decreased to 8, inspiratory time increased to 0.75 with resultant decrease in driving pressure of 18, plateau of 26 and subsequent improvement in blood pressure from a mean of 57 to a mean of 65.  Patient seems to  be very sensitive to intrathoracic pressures due to ARDS I expect hypercapnia with the strategy as long as the pH is above 7.1 we will allow this      Review of Systems: Unable to obtain due to patient factors.      Lines: (insertion date)   ETT  (Active)     NG/OG/NJ/NE Tube Center mouth (Active)       Urinary Catheter 11/25/23 Foley (Active)     CVC  11/27/23 Right Internal jugular (Active)     Drips: current dose (range)  Dose (mcg/kg/min) Propofol  : 0 mcg/kg/min  Dose (mcg/kg/hr) Dexmedetomidine : 0.6 mcg/kg/hr  Dose (mcg/hr) Fentanyl : 75 mcg/hr  Dose (mcg/min) Norepinephrine : 2 mcg/min     Pertinent Exam:         Blood pressure (!) 106/54, pulse 67, temperature 100 F (37.8 C), temperature source Axillary, resp. rate 26, height 1.524 m (5'), weight 87 kg (191 lb 12.8 oz), SpO2 95%.   Intake/Output Summary (Last 24 hours) at 12/02/2023 0917  Last data filed at 12/02/2023 0317  Gross per 24 hour   Intake 2065.07 ml   Output 1150 ml   Net 915.07 ml     Constitutional:  currently sedated, intubated.  Critically ill.   EENMT:  Sclera clear, pupils equal, oral mucosa moist  Respiratory: BLS coarse rhonchi, ETT at 21cm at lip.   Cardiovascular:  RRR, no murmur  Gastrointestinal:  soft with no tenderness; positive bowel sounds present  Musculoskeletal:  warm  with no cyanosis, no lower extremity edema  Skin:  no jaundice or ecchymosis  Neurologic:sedated, intubated.  Cough with dep suction.   Psychiatric: sedated          CXR:         10/5          Recent Labs     11/30/23  0433 11/30/23  1106 12/01/23  0624 12/02/23  0419   WBC 35.1*  --  26.7* 18.8*   HGB 11.6*  --  12.2 11.4*   HCT 35.6*  --  38.0 36.8   PLT 169  --  183 143*   PROCAL  --  0.35*  --   --      Recent Labs     11/30/23  0433 12/01/23  0625 12/02/23  0419   NA 146* 151* 151*   K 4.2 4.8 4.6   CL 111* 114* 115*   CO2 28 31* 30*   GLUCOSE 177* 200* 190*   BUN 31* 29* 34*   CREATININE 0.59* 0.49* 0.47*   MG 2.5* 2.4 2.1   PHOS 2.3* 2.7 3.6     Recent Labs      11/30/23  1106   CRP 5.7*     Recent Labs     11/30/23  0433 12/01/23  0625 12/02/23  0419   GLUCOSE 177* 200* 190*      ECHO: 11/25/23    ECHO (TTE) COMPLETE (PRN CONTRAST/BUBBLE/STRAIN/3D) 11/27/2023  8:12 AM (Final)    Interpretation Summary    Left Ventricle: Normal left ventricular systolic function with a visually estimated EF of 65 - 70%. Left ventricle size is normal. Mild septal thickening. Normal wall motion. Normal diastolic function.    Right Ventricle: Right ventricle size is normal.  TAPSE is 2.0 cm. RV Basal Dimension is 2.7 cm. RV Mid Dimension is 2.4 cm. Normal systolic function.    Image quality is fair.    Signed by: Dorn Debby Finder, DO on 11/27/2023  8:12 AM    Microbiology:   No results for input(s): CULTURE in the last 72 hours.    Ventilator Settings Ideal body weight: 45.5 kg (100 lb 4.9 oz)  Adjusted ideal body weight: 62.1 kg (136 lb 14.5 oz)  Mode FIO2 Rate Tidal Volume Pressure   AC/PC    100 %  20 bpm     0.4 mL   10     Peak airway pressure:     Minute ventilation:    ABG:  Recent Labs     11/30/23  0412 12/01/23  0353 12/02/23  0343   BE 7.3 10.4 7.8     Assessment and Plan:  (Medical Decision Making)   Impression: 68 y.o. female admitted 11/27/2023 for Acute respiratory failure with hypoxia (HCC). Admitted with SOB, cough found to have multifocal PNA, transferred to [facility] and desaturated requiring intubation.    NEURO:   Sedation: propofol  for now, daily sedation break  Analgesia: fentanyl  gtt, daily sedation break    CV:   Shock: suspect septic, resuscitation, hemodynamic monitoring, vasopressors weaning, still hypotensive, on Levophed  at 3, stop propofol  substituted with either Versed  or Precedex  in an attempt to wean off pressors.    PULM:   Acute Hypoxemic and Hypercapneic Respiratory failure: LTVV, wean per ARDSnet.  Overall seems to be sensitive to increased intrathoracic pressure.  Ventilator settings adjusted to pressure control at inspiratory pressure of 8 PEEP of  8 with resultant tidal volumes of 540 cc Plateau  pressure of 26 and a driving pressure of 18 this resulted in increase in blood pressure from increased preload.  Overall prognosis is guarded her kidney function is still maintaining with a urine output of 2.06 L in the past 24 hours nevertheless lung injury is severe.  The aim would be for the FiO2 to be reduced to 60% and less to maintain PaO2 of 60 mmHg or more.  May need paralysis ultimately may need proning if that is not achievable as she is on 100% oxygen right now.  Await ABG results in 1 hour after ventilator change.  Discussed with son in detail who is not sure that his mother would want prolonged mechanical ventilation, tracheostomy and debilitated state with the possibility of ICU polyneuropathy as a result of paralytics for ARDS.  He will discuss this with his sister and let us  know of her goals of care in the future.  Remains pressor dependent    Chronic dyspnea: had been on symbicort  in past.  No wheezing currently, so would hold on inhaled steroids.  Albuterol , 3% for now.    RENAL:  Electrolytes: replace PRN, creatinine seems to be preserved and urine output is still preserved we will continue to monitor this closely, at risk of ATN and acute renal failure to constitute multisystem organ failure with ARDS    GI:   Nutrition: NGT placement. Will start TF tomorrow if still intubated and vasopressors low and stable.     HEME:   Anemia: resuscitation, monitor  Leukocytosis: improved levophed  dose and WBC, no fevers.     ID:   Multifocal PNA: on cefepime , azithromycin  for atypical coverage, MRSA negative thus vancomycin  was stopped.  Wean as cultures return.  Blood cultures with no growth from 9/30. Bronch with broad cultures sent. Repeat sputum culture. NRF from bronch BAL    ENDO:   Hyperglycemia: mild on BMP.  Monitor with daily labs for now.  With steroids, may need to monitor more closely    Skin: no decub, turns, preventive care  Prophy:  Lovenox     Son at bedside and updated.  Name Home Phone Work Phone Mobile Phone Relationship Tamsen Pickler   MYNIZD,FJUU [phone]  [phone] Child    DONAL SOBER [phone]  [phone] Daughter-in* No     Full Code    The patient is critically ill with respiratory failure, circulatory failure and requires high complexity decision making for assessment and support including frequent ventilator adjustment , frequent evaluation and titration of therapies , application of advanced monitoring technologies and extensive interpretation of multiple databases    Total critical care time spent 35 minutes    Celine Southern, MD  "

## 2023-12-02 NOTE — Interdisciplinary Rounds (Signed)
"  Multi-D Rounds/Checklist (leapfrog):  Lines: can any be removed?: None   ETT  (Active)     NG/OG/NJ/NE Tube Center mouth (Active)       Urinary Catheter 11/25/23 Foley (Active)     CVC  11/27/23 Right Internal jugular (Active)     DVT Prophylaxis: Ordered-SCDs  Vent: HOB elevated? Yes ;  mL/kg: N/A ; Vent day 6  Nutrition Ordered/appropriate: Ordered  Can antibiotics or other drugs be stopped? Yes/End Date set Yes/No  MRSA swab:   Inpat Anti-Infectives (From admission, onward)       Start     Ordered Stop    11/30/23 1000  azithromycin  (ZITHROMAX ) 500 mg in sodium chloride  0.9 % 250 mL IVPB (Vial2Bag)  500 mg,   IntraVENous,   EVERY 24 HOURS         11/30/23 0941 12/03/23 0959    11/28/23 0030  ceFEPIme  (MAXIPIME ) 2,000 mg in sodium chloride  0.9 % 100 mL IVPB (addEASE)  2,000 mg,   IntraVENous,   EVERY 8 HOURS         11/27/23 1555 12/05/23 0029                  Consults needed: None  A: Is pain control adequate? (has PRNs? Stop drip?) Yes  B: Sedation break and SBT? Yes  C: Is sedation choice appropriate? Yes  D: Delirium/CAM-ICU? No  E: Mobility goals/appropriateness? Orders adjusted  F: Family update and plan? Son is runner, broadcasting/film/video and is being updated daily by primary attending and nursing staff.    Delon LITTIE Magnus, PA  "

## 2023-12-02 NOTE — Care Coordination (Signed)
"  CM reviewed pt chart for continued stay.  Pt remains in ICU, vent day 6. CM visited briefly with son who remains at bedside with pt.  Will continue to follow pt plan of care and assist with any supportive care needs that arise as appropriate.     "

## 2023-12-03 ENCOUNTER — Inpatient Hospital Stay: Admit: 2023-12-03 | Payer: Medicare (Managed Care)

## 2023-12-03 LAB — CBC WITH AUTO DIFFERENTIAL
Basophils %: 0.1 % (ref 0.0–2.0)
Basophils Absolute: 0.02 K/UL (ref 0.00–0.20)
Eosinophils %: 0 % — ABNORMAL LOW (ref 0.5–7.8)
Eosinophils Absolute: 0 K/UL (ref 0.00–0.80)
Hematocrit: 35.3 % — ABNORMAL LOW (ref 35.8–46.3)
Hemoglobin: 11.3 g/dL — ABNORMAL LOW (ref 11.7–15.4)
Immature Granulocytes %: 1 % (ref 0.0–5.0)
Immature Granulocytes Absolute: 0.18 K/UL (ref 0.0–0.5)
Lymphocytes %: 6.8 % — ABNORMAL LOW (ref 13.0–44.0)
Lymphocytes Absolute: 1.2 K/UL (ref 0.50–4.60)
MCH: 33.4 pg — ABNORMAL HIGH (ref 26.1–32.9)
MCHC: 32 g/dL (ref 31.4–35.0)
MCV: 104.4 FL — ABNORMAL HIGH (ref 82–102)
MPV: 10.8 FL (ref 9.4–12.3)
Monocytes %: 1.9 % — ABNORMAL LOW (ref 4.0–12.0)
Monocytes Absolute: 0.34 K/UL (ref 0.10–1.30)
Neutrophils %: 90.2 % — ABNORMAL HIGH (ref 43.0–78.0)
Neutrophils Absolute: 15.81 K/UL — ABNORMAL HIGH (ref 1.70–8.20)
Platelets: 122 K/uL — ABNORMAL LOW (ref 150–450)
RBC: 3.38 M/uL — ABNORMAL LOW (ref 4.05–5.2)
RDW: 13.9 % (ref 11.9–14.6)
WBC: 17.6 K/uL — ABNORMAL HIGH (ref 4.3–11.1)
nRBC: 0 K/uL (ref 0.0–0.2)

## 2023-12-03 LAB — ARTERIAL BLOOD GAS, POC
Base Excess: 7.7 mmol/L
Base Excess: 9.2 mmol/L
FIO2: 100 %
FIO2: 100 %
POC Allen's Test: POSITIVE
POC Allen's Test: POSITIVE
POC HCO3: 32 MMOL/L — ABNORMAL HIGH (ref 21–28)
POC HCO3: 33.9 MMOL/L — ABNORMAL HIGH (ref 21–28)
POC O2 SAT: 95.4 % (ref 94–98)
POC O2 SAT: 92.9 % — ABNORMAL LOW (ref 94–98)
POC PEEP: 8 cmH2O
POC PEEP: 8 cmH2O
POC PO2: 73 mmHg — ABNORMAL LOW (ref 75–100)
POC PO2: 63 mmHg — ABNORMAL LOW (ref 75–100)
POC pCO2: 43 mmHg (ref 35–45)
POC pCO2: 45.5 mmHg — ABNORMAL HIGH (ref 35–45)
POC pH: 7.48 — ABNORMAL HIGH (ref 7.35–7.45)
POC pH: 7.48 — ABNORMAL HIGH (ref 7.35–7.45)
Respiratory Rate: 23
Respiratory Rate: 22

## 2023-12-03 LAB — VENOUS BLOOD GAS, POINT OF CARE
BASE EXCESS, VENOUS (POC): 10.1 mmol/L
FIO2: 100 %
HCO3, Venous: 35.1 MMOL/L — ABNORMAL HIGH (ref 22–29)
PCO2, Venus, POC: 47.6 mmHg (ref 41–51)
PH, VENOUS (POC): 7.48 — ABNORMAL HIGH (ref 7.32–7.42)
PO2, VENOUS (POC): 68 mmHg
POC Allen's Test: POSITIVE
POC PEEP: 8 cmH2O
Respiratory Rate: 24
SO2, VENOUS (POC): 94.1 % — ABNORMAL HIGH (ref 65–88)

## 2023-12-03 LAB — POCT BLOOD GAS & ELECTROLYTES
Allen Test: POSITIVE
Base Excess: 8.1 mmol/L
FIO2 Arterial: 80 %
IPAP/PIP: 12
POC HCO3: 32.7 MMOL/L — ABNORMAL HIGH (ref 21–28)
POC Ionized Calcium: 1.29 mmol/L (ref 1.12–1.32)
POC O2 SAT: 90 %
POC PEEP/CPA: 8
POC PO2: 55 mmHg — ABNORMAL LOW (ref 75–100)
POC Potassium: 4.1 MMOL/L (ref 3.5–5.1)
POC Pressure Support: 8
POC Sodium: 155 MMOL/L — ABNORMAL HIGH (ref 136–145)
POC TCO2: 31 MMOL/L — ABNORMAL HIGH (ref 13–23)
POC pCO2: 44.8 mmHg (ref 35–45)
POC pH: 7.47 — ABNORMAL HIGH (ref 7.35–7.45)
Respiratory Rate: 16

## 2023-12-03 LAB — BASIC METABOLIC PANEL
Anion Gap: 6 mmol/L — ABNORMAL LOW (ref 7–16)
BUN: 33 mg/dL — ABNORMAL HIGH (ref 8–23)
CO2: 30 mmol/L — ABNORMAL HIGH (ref 20–29)
Calcium: 8.6 mg/dL — ABNORMAL LOW (ref 8.8–10.2)
Chloride: 115 mmol/L — ABNORMAL HIGH (ref 98–107)
Creatinine: 0.45 mg/dL — ABNORMAL LOW (ref 0.60–1.10)
Est, Glom Filt Rate: >90 ml/min/1.73m2 (ref >60)
Glucose: 187 mg/dL — ABNORMAL HIGH (ref 70–99)
Potassium: 4.4 mmol/L (ref 3.5–5.1)
Sodium: 151 mmol/L — ABNORMAL HIGH (ref 136–145)

## 2023-12-03 LAB — ATYPICAL PNEUMONIA PANEL
C. pneumoniae PCR: NOT DETECTED
Mycoplasma pneumo by PCR: NOT DETECTED

## 2023-12-03 LAB — POCT GLUCOSE
POC Glucose: 193 mg/dL — ABNORMAL HIGH (ref 65–100)
POC Glucose: 203 mg/dL — ABNORMAL HIGH (ref 65–100)

## 2023-12-03 LAB — MAGNESIUM: Magnesium: 2.3 mg/dL (ref 1.8–2.4)

## 2023-12-03 LAB — PHOSPHORUS: Phosphorus: 3.3 mg/dL (ref 2.5–4.5)

## 2023-12-03 LAB — CCP ANTIBODIES IGG/IGA: CCP Antibodies IgG/IgA: 10 U (ref 0–19)

## 2023-12-03 MED ORDER — MIDAZOLAM HCL (PF) 2 MG/2ML IJ SOLN
2 | Freq: Once | INTRAMUSCULAR | Status: AC
Start: 2023-12-03 — End: 2023-12-03
  Administered 2023-12-03: 20:00:00 2 mg via INTRAVENOUS

## 2023-12-03 MED ORDER — MORPHINE SULFATE 10 MG/ML IV SOLN
10 | INTRAVENOUS | Status: DC | PRN
Start: 2023-12-03 — End: 2023-12-04
  Administered 2023-12-03 – 2023-12-04 (×5): 5 mg via INTRAVENOUS

## 2023-12-03 MED ORDER — EPOPROSTENOL 1.5 MG IN 50 ML NS INHALATION (PREMIX)
RESPIRATORY_TRACT | Status: DC
Start: 2023-12-03 — End: 2023-12-03
  Administered 2023-12-03: 15:00:00 50 ng/kg/min via RESPIRATORY_TRACT

## 2023-12-03 MED ORDER — SCOPOLAMINE 1 MG/3DAYS TD PT72
1 | TRANSDERMAL | Status: DC
Start: 2023-12-03 — End: 2023-12-04
  Administered 2023-12-03: 19:00:00 1 via TRANSDERMAL

## 2023-12-03 MED ORDER — MORPHINE SULFATE 10 MG/ML IV SOLN
10 | Freq: Once | INTRAVENOUS | Status: AC
Start: 2023-12-03 — End: 2023-12-03
  Administered 2023-12-03: 19:00:00 5 mg via INTRAVENOUS

## 2023-12-03 MED ORDER — MIDAZOLAM HCL (PF) 2 MG/2ML IJ SOLN
2 | INTRAMUSCULAR | Status: DC | PRN
Start: 2023-12-03 — End: 2023-12-04
  Administered 2023-12-03 – 2023-12-04 (×4): 2 mg via INTRAVENOUS

## 2023-12-03 MED ORDER — SODIUM CHLORIDE 0.9 % IV SOLN
0.9 | INTRAVENOUS | Status: DC
Start: 2023-12-03 — End: 2023-12-03
  Administered 2023-12-03: 15:00:00 3.4 mL/h via RESPIRATORY_TRACT

## 2023-12-03 MED FILL — LEVOTHYROXINE SODIUM 75 MCG PO TABS: 75 ug | ORAL | Qty: 1

## 2023-12-03 MED FILL — MIDAZOLAM HCL (PF) 2 MG/2ML IJ SOLN: 2 mg/mL | INTRAMUSCULAR | Qty: 2

## 2023-12-03 MED FILL — SCOPOLAMINE 1 MG/3DAYS TD PT72: 1 MG/3DAYS | TRANSDERMAL | Qty: 1

## 2023-12-03 MED FILL — METHYLPREDNISOLONE SODIUM SUCC 125 MG IJ SOLR: 125 mg | INTRAMUSCULAR | Qty: 125

## 2023-12-03 MED FILL — MORPHINE SULFATE 10 MG/ML IV SOLN: 10 mg/mL | INTRAVENOUS | Qty: 1

## 2023-12-03 MED FILL — INSULIN LISPRO 100 UNIT/ML IJ SOLN: 100 [IU]/mL | INTRAMUSCULAR | Qty: 2

## 2023-12-03 MED FILL — SODIUM CHLORIDE 3 % IN NEBU: 3 % | RESPIRATORY_TRACT | Qty: 4

## 2023-12-03 MED FILL — EPOPROSTENOL 1.5 MG IN 50 ML NS INHALATION (PREMIX): RESPIRATORY_TRACT | Qty: 1.5

## 2023-12-03 MED FILL — FENTANYL CITRATE 1000 MCG/100ML IV SOLN: 1000 MCG/100ML | INTRAVENOUS | Qty: 100

## 2023-12-03 MED FILL — DEXMEDETOMIDINE HCL IN NACL 400 MCG/100ML IV SOLN: 400 MCG/100ML | INTRAVENOUS | Qty: 100

## 2023-12-03 MED FILL — THIAMINE HCL 100 MG PO TABS: 100 mg | ORAL | Qty: 1

## 2023-12-03 MED FILL — CEFEPIME HCL 2 G IV SOLR: 2 g | INTRAVENOUS | Qty: 2

## 2023-12-03 MED FILL — PANTOPRAZOLE SODIUM 40 MG IV SOLR: 40 mg | INTRAVENOUS | Qty: 40

## 2023-12-03 MED FILL — LOVENOX 40 MG/0.4ML IJ SOSY: 40 MG/0.4ML | INTRAMUSCULAR | Qty: 0.4

## 2023-12-03 MED FILL — FOLIC ACID 1 MG PO TABS: 1 mg | ORAL | Qty: 1

## 2023-12-03 NOTE — Progress Notes (Signed)
"  Patient's family expressed desire to initiate comfort care measures and compassionately extubate. Dr. Estell notified and questions were answered. Code status changed to DNR.  "

## 2023-12-03 NOTE — Progress Notes (Signed)
"  Spiritual Health Critical Care Progress Note  [name]      Room # 6885/98    Name: Sandra Harmon           Age: 68 y.o.    Gender: female          MRN: [id number]  Religion: Christian       Preferred Language: English      Date: 12/03/23  Visit Time: Begin Time: 1120 End Time : 1130  Complexity of Encounter: Moderate      Visit Summary: Chaplain visited patient and family in ICU after attending IDR to assess spiritual health needs. Patient did not respond at the time, but appeared comfortable.  Family was at bedside and expressed appropriate grief as they considered goals of care. Family engaged in spiritual reflection, stating acknowledging their grief, but also taking comfort in knowing patient is in God's care. Chaplain provided pastoral presence, prayer and empathetic listening. Chaplain will continue to follow patient in ICU and assess further support.      Referral/Consult From: Rounding  Encounter Overview/Reason: Follow-up  Service Provided For: Patient     Patient was available.    Faith, Belief, Meaning:   Patient is connected with a faith tradition or spiritual practice  Family/Friends identifies as spiritual  are connected with a faith tradition or spiritual practice  have beliefs or practices that help with coping during difficult times  faith/ spirituality is a source of strength    Importance and Influence:  Patient does not have spiritual/personal beliefs that influence decisions regarding their health  Family/Friends does not have spiritual/personal beliefs that influence decisions regarding the patient's health    Community:  Patient   is connected with a spiritual community  Support System Includes   Children, Family members   Family/Friends   is connected with a spiritual community  indicated that they feel well-supported  Support System Includes   Children, Family members   Faith Community    Assessment and Plan of Care:   Emotions Expressed by Patient:   Assessment: Unable to  assess    Interventions by Chaplain:   Intervention: Prayer (assurance of)/Blessing, Sustaining Presence/Ministry of presence     Result/ Response by Patient:   Outcome: Did not respond    Patient Plan of Care:   Plan and Referrals  Plan/Referrals: Continue to visit, (comment)     Emotions Expressed by Spouse/Family/Friends:   calm  grieving    Chaplain Interventions with Spouse/ Family/Friends include:   active listening  prayer  provided ministry of presence  facilitated life review and/or legacy    Spouse/Family/Friends Plan of Care:   Spiritual care available upon referral.      Electronically signed by    Peace be with you,    Chaplain Prentice Molt, M.Div., B.C.C.   on 12/03/2023 at 1:44 PM.   Spiritual Health   [facility]  [phone]         "

## 2023-12-03 NOTE — Progress Notes (Signed)
"  Patient compassionately extubated at this time to 2L NC per order.    Signe Moats, RCP   "

## 2023-12-03 NOTE — Progress Notes (Addendum)
 "  [name] Critical Care Note:: 12/03/2023  Sandra Harmon  Admission Date: 11/27/2023     Length of Stay: 6 days    Background: 68 y.o. Caucasian female with PMH significant for HLD, hypothyroidism, obesity who was seen and evaluated at the request of Dr. Leron after presentation on 9/30 for chest pain/shortness of breath. On arrival to ED, patient was noted to be hypoxic to mid 80s on RA and placed on HFNC 40L/60%.  Initial labs indicated lactic 3.4, Pro-Cal 1.9. WBC 13.5. Chest x-ray notable for diffuse bilateral infiltrates. ABG after being placed on CPAP 7.52/26/52/21. Admitted to ICU on HHFNC with increasing requirements over the past 24 hours. Pulmonary consulted for assistance AHRF and severe pneumonia.      Patient previously seen by Dr. Aurora in 2024 for chronic shortness of breath of unclear etiology after COVID infection. PFTs previously with mild restriction. Had been trialed on Symbicort /albuterol .   She was feeling ill starting on Friday with fatigue, malaise and vomiting. 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 He denies her having fevers or chills. No reported hemoptysis.      Decided to transfer patient downtown due to worsening oxygen needs.  Upon EMS arrival, she dropped her O2 sats to ~60% and was intubated prior to transport.      Notable PMH:  has a past medical history of Arthritis, Chronic back pain, Colon polyp, Hypertension, Hyperthyroidism, Kidney stone, Liver disease, and Thyroid disease.    24 Hour events:   Worse over night, now on 100% FiO2 on SIMV, plateau pressure 31, driving pressure 20, chest x-ray with worsening infiltrates, was transiently able to stay on about 70% FiO2 yesterday but that has worsened, long discussion with son-in-law, son and daughter yesterday reviewed CT scan with him and explained ARDS and its implications in detail.      Review of Systems: Unable to obtain due to patient factors.       Lines: (insertion date)   ETT  (Active)     NG/OG/NJ/NE Tube Center mouth (Active)       Urinary Catheter 11/25/23 Foley (Active)     CVC  11/27/23 Right Internal jugular (Active)     Drips: current dose (range)  Dose (mcg/kg/min) Propofol  : 0 mcg/kg/min  Dose (mcg/kg/hr) Dexmedetomidine : 0.8 mcg/kg/hr  Dose (mcg/hr) Fentanyl : 75 mcg/hr (cpot 3)  Dose (mcg/min) Norepinephrine : 2 mcg/min (map 110 [Action automatically changed])     Pertinent Exam:         Blood pressure (!) 93/45, pulse 56, temperature 99.3 F (37.4 C), temperature source Oral, resp. rate 16, height 1.524 m (5'), weight 87 kg (191 lb 12.8 oz), SpO2 (!) 89%.   Intake/Output Summary (Last 24 hours) at 12/03/2023 0841  Last data filed at 12/03/2023 0816  Gross per 24 hour   Intake 1328.47 ml   Output 1575 ml   Net -246.53 ml     Constitutional:  currently sedated, intubated.  Critically ill.   EENMT:  Sclera clear, pupils equal, oral mucosa moist  Respiratory: BLS no wheezing, ETT at 21cm at lip.   Cardiovascular:  RRR, no murmur  Gastrointestinal:  soft with no tenderness; positive bowel sounds present  Musculoskeletal:  warm with no cyanosis, no lower extremity edema  Skin:  no jaundice or ecchymosis  Neurologic:sedated, intubated.  Cough with dep suction.   Psychiatric: sedated          CXR:  Recent Labs     11/30/23  1106 12/01/23  0624 12/02/23  0419 12/03/23  0352   WBC  --  26.7* 18.8* 17.6*   HGB  --  12.2 11.4* 11.3*   HCT  --  38.0 36.8 35.3*   PLT  --  183 143* 122*   PROCAL 0.35*  --   --   --      Recent Labs     12/01/23  0625 12/02/23  0419 12/03/23  0352   NA 151* 151* 151*   K 4.8 4.6 4.4   CL 114* 115* 115*   CO2 31* 30* 30*   GLUCOSE 200* 190* 187*   BUN 29* 34* 33*   CREATININE 0.49* 0.47* 0.45*   MG 2.4 2.1 2.3   PHOS 2.7 3.6 3.3     Recent Labs     11/30/23  1106   CRP 5.7*     Recent Labs     12/01/23  0625 12/02/23  0419 12/03/23  0352   GLUCOSE 200* 190* 187*      ECHO: 11/25/23    ECHO (TTE) COMPLETE (PRN  CONTRAST/BUBBLE/STRAIN/3D) 11/27/2023  8:12 AM (Final)    Interpretation Summary    Left Ventricle: Normal left ventricular systolic function with a visually estimated EF of 65 - 70%. Left ventricle size is normal. Mild septal thickening. Normal wall motion. Normal diastolic function.    Right Ventricle: Right ventricle size is normal.  TAPSE is 2.0 cm. RV Basal Dimension is 2.7 cm. RV Mid Dimension is 2.4 cm. Normal systolic function.    Image quality is fair.    Signed by: Dorn Debby Finder, DO on 11/27/2023  8:12 AM    Microbiology:   No results for input(s): CULTURE in the last 72 hours.    Ventilator Settings Ideal body weight: 45.5 kg (100 lb 4.9 oz)  Adjusted ideal body weight: 62.1 kg (136 lb 14.5 oz)  Mode FIO2 Rate Tidal Volume Pressure   SIMV/PC    97 %  32 bpm     0.4 mL   7.6     Peak airway pressure:     Minute ventilation:    ABG:  Recent Labs     12/02/23  0343 12/02/23  1023 12/03/23  0502   BE 7.8 8.7 8.1     Assessment and Plan:  (Medical Decision Making)   Impression: 68 y.o. female admitted 11/27/2023 for Acute respiratory failure with hypoxia (HCC). Admitted with SOB, cough found to have multifocal PNA, transferred to [facility] and desaturated requiring intubation.    NEURO:   Sedation: propofol  for now, daily sedation break  Analgesia: fentanyl  gtt, daily sedation break    CV:   Shock: suspect septic, resuscitation, hemodynamic monitoring, vasopressors weaning, still hypotensive, on Levophed  despite previous resuscitation.  On appropriate broad-spectrum antibiotics  PULM:   Acute Hypoxemic and Hypercapneic Respiratory failure: LTVV, wean per ARDSnet.  Patient with ARDS unfortunately with worsening oxygen requirement now on 100% FiO2 with increased stiffness of the lungs increased plateau pressure increase driving pressure and worsening infiltrates on chest x-ray.  I had a long discussion with the family yesterday and explained the implications of continued worsening, they seem to be  thinking about comfort care for the patient, and the time being I explained that the only way forward is to try Flolan  to see whether that will help with oxygenation and potentially paralyzed and prone with very dismal long-term prognosis with this approach in this patient.  They are going to gather the family members and it seems to me they may decide on comfort care for this patient later today.  Start Flolan  however to see whether we can improve oxygenation on this patient.    Chronic dyspnea: had been on symbicort  in past.  No wheezing currently, so would hold on inhaled steroids.  Albuterol , 3% for now.    RENAL:  Electrolytes: replace PRN, creatinine seems to be preserved and urine output is still preserved we will continue to monitor this closely, at risk of ATN and acute renal failure to constitute multisystem organ failure with ARDS    GI:   Nutrition: NGT placement.  Tube feeds  HEME:   Anemia: resuscitation, monitor  Leukocytosis: improved levophed  dose and WBC, no fevers.     ID:   Multifocal PNA: on cefepime , azithromycin  for atypical coverage, MRSA negative thus vancomycin  was stopped.  Wean as cultures return.  Blood cultures with no growth from 9/30. Bronch with broad cultures sent. Repeat sputum culture. NRF from bronch BAL    ENDO:   Hyperglycemia: mild on BMP.  Monitor with daily labs for now.  With steroids, may need to monitor more closely    Skin: no decub, turns, preventive care  Prophy: Lovenox     Son at bedside and updated.  Name Home Phone Work Phone Mobile Phone Relationship Tamsen Pickler   MYNIZD,FJUU [phone]  [phone] Child    DONAL SOBER [phone]  [phone] Daughter-in* No     Full Code    The patient is critically ill with respiratory failure, circulatory failure and requires high complexity decision making for assessment and support including frequent ventilator adjustment , frequent evaluation and titration of therapies , application of advanced monitoring technologies  and extensive interpretation of multiple databases    Total critical care time spent 40 minutes, prognosis is poor and I believe the patient will not survive this event.    Celine Southern, MD    Addendum [phone]:    After all the family gathered at the bedside, decision has been made to proceed with comfort care, once family ready all nonessential medications will be stopped the patient will be compassionately extubated and allowed to pass peacefully with only analgesics and anxiolytics on order as needed if needed.    Family decided against organ donation.    Celine Southern MD.    "

## 2023-12-03 NOTE — Interdisciplinary Rounds (Signed)
"  Multi-D Rounds/Checklist (leapfrog):  Lines: can any be removed?: None   ETT  (Active)     NG/OG/NJ/NE Tube Center mouth (Active)       Urinary Catheter 11/25/23 Foley (Active)     CVC  11/27/23 Right Internal jugular (Active)     DVT Prophylaxis: Ordered-SCDs  Vent: HOB elevated? Yes ;  mL/kg: N/A ; Vent day 7  Nutrition Ordered/appropriate: Ordered  Can antibiotics or other drugs be stopped? Yes/End Date set Yes/No  MRSA swab:   Inpat Anti-Infectives (From admission, onward)       Start     Ordered Stop    11/28/23 0030  ceFEPIme  (MAXIPIME ) 2,000 mg in sodium chloride  0.9 % 100 mL IVPB (addEASE)  2,000 mg,   IntraVENous,   EVERY 8 HOURS         11/27/23 1555 12/05/23 0029                  Consults needed: None  A: Is pain control adequate? (has PRNs? Stop drip?) Yes  B: Sedation break and SBT? Yes  C: Is sedation choice appropriate? Yes  D: Delirium/CAM-ICU? No  E: Mobility goals/appropriateness? Orders adjusted  F: Family update and plan? Son is runner, broadcasting/film/video and is being updated daily by primary attending and nursing staff.    Delon LITTIE Magnus, PA  "

## 2023-12-03 NOTE — Progress Notes (Signed)
"  Ventilator check complete; patient has a #7.5 ET tube secured at the 22 at the lip.  Patient is  sedated.  Patient is not able to follow commands.  Breath sounds are coarse and crackles.  Trachea is midline, Negative for subcutaneous air, and chest excursion is symmetric. Patient is also Negative for cyanosis and is Positive for pitting edema.  All alarms are set and audible.  Resuscitation bag is  at the head of the bed.      Ventilator Settings  Mode FIO2 Rate ~ Tidal Volume Pressure Control PEEP I:E Ratio   SIMV/PC  100% 16 bpm 480 - 560 mL 12 cm H20  8 cm H20 1:2.7      Peak airway pressure: 16 cm H20  Minute ventilation: 8.8 L      Signe Moats, RCP   "

## 2023-12-03 NOTE — Progress Notes (Signed)
"  Nutrition Note     Noted pt now on comfort care measures only. TF orders discontinued. If goals of care change and nutrition intervention desired, please consult Nutrition Services.     Lauraine Makos, RD      "

## 2023-12-03 NOTE — Care Coordination (Signed)
"  Pt continues on vent, CM will continue to follow.  "

## 2023-12-04 MED FILL — MIDAZOLAM HCL (PF) 2 MG/2ML IJ SOLN: 2 mg/mL | INTRAMUSCULAR | Qty: 2

## 2023-12-04 MED FILL — MORPHINE SULFATE 10 MG/ML IV SOLN: 10 mg/mL | INTRAVENOUS | Qty: 1

## 2023-12-04 NOTE — Discharge Summary (Addendum)
 "Discharge Summary    Date: 12/14/23  Patient Name: Sandra Harmon    Date of Birth: 01/19/1956     Age: 68 y.o.    Admit Date: 11/27/2023  Discharge Date:  Discharge Condition:    Admission Diagnosis  Acute respiratory failure with hypoxia (HCC) [J96.01];Acute respiratory failure (HCC) [J96.00]      Discharge Diagnosis  Principal Problem:    Acute respiratory failure with hypoxia (HCC)  Active Problems:    Chronic bronchitis, unspecified chronic bronchitis type (HCC)    Community acquired pneumonia, bilateral    Septic shock (HCC)    ARDS (adult respiratory distress syndrome) (HCC)  Resolved Problems:    * No resolved hospital problems. [facility] Stay  Narrative of Hospital Course:   68 y.o. Caucasian female with PMH significant for HLD, hypothyroidism, obesity who was seen and evaluated at the request of Dr. Leron after presentation on 9/30 for chest pain/shortness of breath. On arrival to ED, patient was noted to be hypoxic to mid 80s on RA and placed on HFNC 40L/60%.  Initial labs indicated lactic 3.4, Pro-Cal 1.9. WBC 13.5. Chest x-ray notable for diffuse bilateral infiltrates. ABG after being placed on CPAP 7.52/26/52/21. Admitted to ICU on HHFNC with increasing requirements over the past 24 hours. Pulmonary consulted for assistance AHRF and severe pneumonia.      Patient previously seen by Dr. Aurora in 2024 for chronic shortness of breath of unclear etiology after COVID infection. PFTs previously with mild restriction. Had been trialed on Symbicort /albuterol .   She was feeling ill starting on Friday with fatigue, malaise and vomiting. 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 He denies her having fevers or chills. No reported hemoptysis.      Decided to transfer patient downtown due to worsening oxygen needs.  Upon EMS arrival, she dropped her O2 sats to ~60% and was intubated prior to transport.       Notable PMH:  has a past medical history  of Arthritis, Chronic back pain, Colon polyp, Hypertension, Hyperthyroidism, Kidney stone, Liver disease, and Thyroid disease.     24 Hour events:   Worse over night, now on 100% FiO2 on SIMV, plateau pressure 31, driving pressure 20, chest x-ray with worsening infiltrates, was transiently able to stay on about 70% FiO2 yesterday but that has worsened, long discussion with son-in-law, son and daughter yesterday reviewed CT scan with him and explained ARDS and its implications in detail.  Family decided to make patient comfort measures only.  She was mainly extubated and all her life support was discontinued and she passed away with family at bedside.      Consultants:  IP CONSULT TO DIETITIAN    Surgeries/procedures Performed:      Treatments:            Discharge Plan/Disposition:  Expired    Hospital/Incidental Findings Requiring Follow Up:    Patient Instructions:    Diet:    Activity:Activity as Tolerated  For number of days (if applicable):      Other Instructions:    Provider Follow-Up:   No follow-ups on file.     Significant Diagnostic Studies:    Recent Labs:  Admission on 11/27/2023  No results displayed because visit has over 200 results.    ------------    Radiology last 7 days:  XR CHEST PORTABLE  Result Date: 12/03/2023  Worsening scattered interstitial infiltrates  bilaterally. Cannot exclude small bilateral pleural effusions. Electronically signed by Reyes Roller    XR CHEST PORTABLE  Result Date: 12/01/2023  Bilateral groundglass opacities again noted without significant change. No pneumothorax or significant pleural effusion noted. Electronically signed by Tyra Rattler MD    CT CHEST PULMONARY EMBOLISM W CONTRAST  Result Date: 11/30/2023  1.  No CT evidence of pulmonary embolism. 2.  Extensive bilateral pulmonary airspace opacities likely on an infectious inflammatory basis. 3.  Mildly prominent mediastinal lymph nodes which are likely reactive. Electronically signed by Levada Bathe    XR CHEST  PORTABLE  Result Date: 11/30/2023  The support apparatus is stable. The cardiac mediastinal silhouette is stable in size.SABRA Patchy bilateral pulmonary opacities. There is improved aeration in the right upper lobe. There is no pneumothorax. Electronically signed by Michelina GORMAN Bold    XR CHEST PORTABLE  Result Date: 11/29/2023  Findings/impression: Mildly increased ill-defined interstitial and airspace opacities seen throughout the lungs. The balance of the examination is similar to the prior. Electronically signed by Deward CHRISTELLA Duran    XR CHEST PORTABLE  Result Date: 11/27/2023  As above Electronically signed by Robert Siddique    XR ABDOMEN (KUB) (SINGLE AP VIEW)  Result Date: 11/27/2023  As above Electronically signed by Robert Fox    XR CHEST PORTABLE  Result Date: 11/27/2023  Interval retraction of endotracheal tube now 4.2 cm above the carina. No other acute interval change. Electronically signed by Jorene Ivy MD    XR CHEST 1 VIEW  Result Date: 11/27/2023  Extensive bilateral consolidating infiltrates, worsening within the right lung base. Interval ET tube insertion into the left mainstem bronchus. This should be withdrawn at least 3 cm. ET tube position was communicated by me personally via phone to the ICU nurse at 3:52 p.m. on 11/27/2023. This finding was already known at that time. Electronically signed by Ozell Been       Pending Labs     Order Current Status    AFB Culture + Smear W/Rflx ID From Culture Preliminary result        Discharge Medications    Current Discharge Medication List        Current Discharge Medication List        Current Discharge Medication List    CONTINUE these medications which have NOT CHANGED    sertraline  (ZOLOFT ) 50 MG tablet  Take 1 tablet by mouth daily  Qty: 90 tablet Refills: 1  Associated Diagnoses:Depression, unspecified depression type    pravastatin  (PRAVACHOL ) 20 MG tablet  Take 1 tablet by mouth daily  Qty: 90 tablet Refills: 1  Associated Diagnoses:Mixed  hyperlipidemia    levothyroxine  (SYNTHROID ) 75 MCG tablet  Take 1 tablet by mouth every morning (before breakfast)  Qty: 90 tablet Refills: 1  Associated Diagnoses:Hypothyroidism, unspecified type    alendronate  (FOSAMAX ) 70 MG tablet  Take 1 tablet by mouth every 7 days  Qty: 12 tablet Refills: 1  Associated Diagnoses:Osteoporosis, unspecified osteoporosis type, unspecified pathological fracture presence    albuterol  sulfate HFA (PROVENTIL ;VENTOLIN ;PROAIR ) 108 (90 Base) MCG/ACT inhaler  Inhale 2 puffs into the lungs every 6 hours as needed for Wheezing  Qty: 18 g Refills: 3    baclofen  (LIORESAL ) 10 MG tablet  Take 1 tablet by mouth 2 times daily  Qty: 20 tablet Refills: 0  Associated Diagnoses:Back spasm    predniSONE (DELTASONE) 5 MG tablet  Take 1 tablet by mouth daily    aspirin 81 MG chewable tablet  Take  by mouth daily          Current Discharge Medication List        Time Spent on Discharge:  minutes were spent in patient examination, evaluation, counseling as well as medication reconciliation, prescriptions for required medications, discharge plan, and follow up.    Electronically signed by Fairy Santos, MD on 2023/12/18 at 5:30 AM EDT     "

## 2023-12-04 NOTE — Flowsheet Note (Signed)
"  Added to restraint log as death within 24 hours of being bilateral soft wrist restraints.  12/05/2023  08:48  "

## 2023-12-27 DEATH — deceased

## 2023-12-29 LAB — FUNGUS (OTHER): Fungus (Mycology) Culture: NEGATIVE

## 2023-12-29 LAB — CULTURE, FUNGUS

## 2024-01-01 ENCOUNTER — Encounter: Payer: Medicare (Managed Care) | Attending: Nurse Practitioner

## 2024-01-13 LAB — AFB CULTURE + SMEAR W/RFLX ID FROM CULTURE
AFB Smear: NEGATIVE
Acid Fast Culture: NEGATIVE

## 2024-01-21 ENCOUNTER — Encounter: Payer: Medicare (Managed Care) | Attending: Family

## 2024-02-03 ENCOUNTER — Encounter: Payer: Medicare (Managed Care) | Attending: Critical Care Medicine
